# Patient Record
Sex: Male | Born: 1935 | Race: White | Hispanic: No | Marital: Married | Smoking: Former smoker
Health system: Southern US, Community
[De-identification: ages and names within clinical notes are randomized; demographics above are authoritative.]

## PROBLEM LIST (undated history)

## (undated) DIAGNOSIS — M1990 Unspecified osteoarthritis, unspecified site: Secondary | ICD-10-CM

## (undated) DIAGNOSIS — F419 Anxiety disorder, unspecified: Secondary | ICD-10-CM

## (undated) DIAGNOSIS — N400 Enlarged prostate without lower urinary tract symptoms: Secondary | ICD-10-CM

## (undated) DIAGNOSIS — I739 Peripheral vascular disease, unspecified: Secondary | ICD-10-CM

## (undated) DIAGNOSIS — I10 Essential (primary) hypertension: Secondary | ICD-10-CM

## (undated) DIAGNOSIS — I4892 Unspecified atrial flutter: Principal | ICD-10-CM

## (undated) DIAGNOSIS — R0602 Shortness of breath: Secondary | ICD-10-CM

## (undated) DIAGNOSIS — I509 Heart failure, unspecified: Secondary | ICD-10-CM

## (undated) DIAGNOSIS — E785 Hyperlipidemia, unspecified: Secondary | ICD-10-CM

## (undated) DIAGNOSIS — M109 Gout, unspecified: Secondary | ICD-10-CM

## (undated) DIAGNOSIS — I499 Cardiac arrhythmia, unspecified: Secondary | ICD-10-CM

## (undated) DIAGNOSIS — J449 Chronic obstructive pulmonary disease, unspecified: Secondary | ICD-10-CM

## (undated) DIAGNOSIS — R7303 Prediabetes: Secondary | ICD-10-CM

## (undated) DIAGNOSIS — K5730 Diverticulosis of large intestine without perforation or abscess without bleeding: Secondary | ICD-10-CM

## (undated) HISTORY — DX: Hyperlipidemia, unspecified: E78.5

## (undated) HISTORY — DX: Benign prostatic hyperplasia without lower urinary tract symptoms: N40.0

## (undated) HISTORY — PX: COLOSTOMY REVERSAL: SHX5782

## (undated) HISTORY — DX: Peripheral vascular disease, unspecified: I73.9

## (undated) HISTORY — PX: COLON SURGERY: SHX602

## (undated) HISTORY — DX: Essential (primary) hypertension: I10

## (undated) HISTORY — DX: Unspecified osteoarthritis, unspecified site: M19.90

## (undated) HISTORY — PX: ANKLE FRACTURE SURGERY: SHX122

## (undated) HISTORY — PX: CORNEAL TRANSPLANT: SHX108

## (undated) HISTORY — PX: INGUINAL HERNIA REPAIR: SUR1180

## (undated) HISTORY — PX: CATARACT EXTRACTION, BILATERAL: SHX1313

## (undated) HISTORY — PX: TONSILLECTOMY AND ADENOIDECTOMY: SUR1326

## (undated) HISTORY — DX: Diverticulosis of large intestine without perforation or abscess without bleeding: K57.30

## (undated) HISTORY — PX: COLONOSCOPY WITH ESOPHAGOGASTRODUODENOSCOPY (EGD): SHX5779

---

## 1995-10-31 HISTORY — PX: COLOSTOMY: SHX63

## 1995-10-31 HISTORY — PX: APPENDECTOMY: SHX54

## 1995-10-31 HISTORY — PX: PARTIAL COLECTOMY: SHX5273

## 1999-09-30 HISTORY — PX: COLONOSCOPY WITH ESOPHAGOGASTRODUODENOSCOPY (EGD): SHX5779

## 2001-06-30 HISTORY — PX: OTHER SURGICAL HISTORY: SHX169

## 2002-09-29 HISTORY — PX: CHOLECYSTECTOMY: SHX55

## 2004-10-05 ENCOUNTER — Ambulatory Visit: Payer: Self-pay | Admitting: Internal Medicine

## 2005-04-12 ENCOUNTER — Ambulatory Visit: Payer: Self-pay | Admitting: Internal Medicine

## 2005-04-12 LAB — CONVERTED CEMR LAB: PSA: 0.42 ng/mL

## 2005-07-10 ENCOUNTER — Ambulatory Visit: Payer: Self-pay | Admitting: Internal Medicine

## 2005-07-11 ENCOUNTER — Ambulatory Visit: Payer: Self-pay | Admitting: Internal Medicine

## 2005-07-12 ENCOUNTER — Ambulatory Visit: Payer: Self-pay | Admitting: Internal Medicine

## 2005-10-17 ENCOUNTER — Ambulatory Visit: Payer: Self-pay | Admitting: Internal Medicine

## 2005-12-14 ENCOUNTER — Ambulatory Visit: Payer: Self-pay | Admitting: Internal Medicine

## 2006-02-12 ENCOUNTER — Ambulatory Visit: Payer: Self-pay | Admitting: Internal Medicine

## 2006-06-27 ENCOUNTER — Ambulatory Visit: Payer: Self-pay | Admitting: Internal Medicine

## 2006-08-30 HISTORY — PX: VENTRAL HERNIA REPAIR: SHX424

## 2006-08-31 ENCOUNTER — Ambulatory Visit: Payer: Self-pay | Admitting: General Surgery

## 2006-09-12 ENCOUNTER — Ambulatory Visit: Payer: Self-pay | Admitting: General Surgery

## 2006-09-21 ENCOUNTER — Ambulatory Visit: Payer: Self-pay | Admitting: General Surgery

## 2006-09-21 ENCOUNTER — Other Ambulatory Visit: Payer: Self-pay

## 2006-09-25 ENCOUNTER — Ambulatory Visit: Payer: Self-pay | Admitting: Internal Medicine

## 2006-09-28 ENCOUNTER — Ambulatory Visit: Payer: Self-pay | Admitting: General Surgery

## 2006-11-01 ENCOUNTER — Ambulatory Visit: Payer: Self-pay | Admitting: Family Medicine

## 2006-12-31 ENCOUNTER — Ambulatory Visit: Payer: Self-pay | Admitting: Internal Medicine

## 2007-05-28 ENCOUNTER — Ambulatory Visit: Payer: Self-pay | Admitting: Internal Medicine

## 2007-05-29 LAB — CONVERTED CEMR LAB
ALT: 16 units/L (ref 0–53)
AST: 24 units/L (ref 0–37)
Albumin: 3.9 g/dL (ref 3.5–5.2)
Alkaline Phosphatase: 64 units/L (ref 39–117)
BUN: 18 mg/dL (ref 6–23)
Basophils Absolute: 0 10*3/uL (ref 0.0–0.1)
Basophils Relative: 0.5 % (ref 0.0–1.0)
Bilirubin, Direct: 0.1 mg/dL (ref 0.0–0.3)
CO2: 33 meq/L — ABNORMAL HIGH (ref 19–32)
Calcium: 9.5 mg/dL (ref 8.4–10.5)
Chloride: 100 meq/L (ref 96–112)
Creatinine, Ser: 1.1 mg/dL (ref 0.4–1.5)
Eosinophils Absolute: 0.4 10*3/uL (ref 0.0–0.6)
Eosinophils Relative: 4.1 % (ref 0.0–5.0)
GFR calc Af Amer: 85 mL/min
GFR calc non Af Amer: 70 mL/min
Glucose, Bld: 112 mg/dL — ABNORMAL HIGH (ref 70–99)
HCT: 41.9 % (ref 39.0–52.0)
Hemoglobin: 14.5 g/dL (ref 13.0–17.0)
Lymphocytes Relative: 26.7 % (ref 12.0–46.0)
MCHC: 34.6 g/dL (ref 30.0–36.0)
MCV: 96.7 fL (ref 78.0–100.0)
Monocytes Absolute: 0.8 10*3/uL — ABNORMAL HIGH (ref 0.2–0.7)
Monocytes Relative: 9 % (ref 3.0–11.0)
Neutro Abs: 5.3 10*3/uL (ref 1.4–7.7)
Neutrophils Relative %: 59.7 % (ref 43.0–77.0)
Phosphorus: 3.3 mg/dL (ref 2.3–4.6)
Platelets: 264 10*3/uL (ref 150–400)
Potassium: 4.7 meq/L (ref 3.5–5.1)
RBC: 4.33 M/uL (ref 4.22–5.81)
RDW: 14.8 % — ABNORMAL HIGH (ref 11.5–14.6)
Sed Rate: 16 mm/hr (ref 0–20)
Sodium: 139 meq/L (ref 135–145)
TSH: 1.11 microintl units/mL (ref 0.35–5.50)
Total Bilirubin: 1.1 mg/dL (ref 0.3–1.2)
Total Protein: 7.2 g/dL (ref 6.0–8.3)
WBC: 8.8 10*3/uL (ref 4.5–10.5)

## 2007-05-30 ENCOUNTER — Telehealth (INDEPENDENT_AMBULATORY_CARE_PROVIDER_SITE_OTHER): Payer: Self-pay | Admitting: *Deleted

## 2007-06-05 ENCOUNTER — Encounter: Payer: Self-pay | Admitting: Internal Medicine

## 2007-06-12 ENCOUNTER — Encounter: Payer: Self-pay | Admitting: Internal Medicine

## 2007-06-12 DIAGNOSIS — E1351 Other specified diabetes mellitus with diabetic peripheral angiopathy without gangrene: Secondary | ICD-10-CM

## 2007-06-12 DIAGNOSIS — K5730 Diverticulosis of large intestine without perforation or abscess without bleeding: Secondary | ICD-10-CM | POA: Insufficient documentation

## 2007-06-12 DIAGNOSIS — N400 Enlarged prostate without lower urinary tract symptoms: Secondary | ICD-10-CM | POA: Insufficient documentation

## 2007-06-13 DIAGNOSIS — M1990 Unspecified osteoarthritis, unspecified site: Secondary | ICD-10-CM | POA: Insufficient documentation

## 2007-06-17 ENCOUNTER — Ambulatory Visit: Payer: Self-pay | Admitting: Internal Medicine

## 2007-08-08 ENCOUNTER — Ambulatory Visit: Payer: Self-pay | Admitting: Family Medicine

## 2007-08-08 LAB — CONVERTED CEMR LAB
Bilirubin Urine: NEGATIVE
Blood in Urine, dipstick: NEGATIVE
Glucose, Urine, Semiquant: NEGATIVE
Ketones, urine, test strip: NEGATIVE
Nitrite: NEGATIVE
Protein, U semiquant: NEGATIVE
Specific Gravity, Urine: 1.02
Urobilinogen, UA: NEGATIVE
WBC Urine, dipstick: NEGATIVE
pH: 6.5

## 2007-08-16 ENCOUNTER — Ambulatory Visit: Payer: Self-pay | Admitting: Internal Medicine

## 2007-11-14 ENCOUNTER — Ambulatory Visit: Payer: Self-pay | Admitting: Family Medicine

## 2007-11-14 DIAGNOSIS — I10 Essential (primary) hypertension: Secondary | ICD-10-CM

## 2007-11-14 DIAGNOSIS — E1159 Type 2 diabetes mellitus with other circulatory complications: Secondary | ICD-10-CM | POA: Insufficient documentation

## 2007-11-19 ENCOUNTER — Ambulatory Visit: Payer: Self-pay | Admitting: Family Medicine

## 2007-11-22 ENCOUNTER — Ambulatory Visit: Payer: Self-pay | Admitting: Family Medicine

## 2007-11-22 LAB — CONVERTED CEMR LAB
ALT: 21 units/L (ref 0–53)
AST: 25 units/L (ref 0–37)
Albumin: 4.2 g/dL (ref 3.5–5.2)
Alkaline Phosphatase: 65 units/L (ref 39–117)
BUN: 20 mg/dL (ref 6–23)
Bilirubin, Direct: 0.1 mg/dL (ref 0.0–0.3)
CO2: 34 meq/L — ABNORMAL HIGH (ref 19–32)
Calcium: 9.5 mg/dL (ref 8.4–10.5)
Chloride: 94 meq/L — ABNORMAL LOW (ref 96–112)
Cholesterol: 161 mg/dL (ref 0–200)
Creatinine, Ser: 1.3 mg/dL (ref 0.4–1.5)
GFR calc Af Amer: 70 mL/min
GFR calc non Af Amer: 58 mL/min
Glucose, Bld: 122 mg/dL — ABNORMAL HIGH (ref 70–99)
Potassium: 4.6 meq/L (ref 3.5–5.1)
Sodium: 137 meq/L (ref 135–145)
TSH: 1.62 microintl units/mL (ref 0.35–5.50)
Total Bilirubin: 0.7 mg/dL (ref 0.3–1.2)
Total Protein: 8 g/dL (ref 6.0–8.3)

## 2007-11-29 ENCOUNTER — Ambulatory Visit: Payer: Self-pay | Admitting: Family Medicine

## 2007-11-29 DIAGNOSIS — E7800 Pure hypercholesterolemia, unspecified: Secondary | ICD-10-CM

## 2007-11-29 DIAGNOSIS — E1169 Type 2 diabetes mellitus with other specified complication: Secondary | ICD-10-CM | POA: Insufficient documentation

## 2007-11-29 DIAGNOSIS — E1149 Type 2 diabetes mellitus with other diabetic neurological complication: Secondary | ICD-10-CM | POA: Insufficient documentation

## 2007-11-29 DIAGNOSIS — J309 Allergic rhinitis, unspecified: Secondary | ICD-10-CM | POA: Insufficient documentation

## 2008-02-25 ENCOUNTER — Ambulatory Visit: Payer: Self-pay | Admitting: Family Medicine

## 2008-02-27 ENCOUNTER — Ambulatory Visit: Payer: Self-pay | Admitting: Family Medicine

## 2008-02-27 LAB — CONVERTED CEMR LAB
BUN: 20 mg/dL (ref 6–23)
CO2: 34 meq/L — ABNORMAL HIGH (ref 19–32)
Calcium: 9.4 mg/dL (ref 8.4–10.5)
Chloride: 104 meq/L (ref 96–112)
Cholesterol: 169 mg/dL (ref 0–200)
Creatinine, Ser: 1 mg/dL (ref 0.4–1.5)
GFR calc Af Amer: 94 mL/min
GFR calc non Af Amer: 78 mL/min
Glucose, Bld: 108 mg/dL — ABNORMAL HIGH (ref 70–99)
HDL: 45.3 mg/dL (ref >39.0)
LDL Cholesterol: 102 mg/dL — ABNORMAL HIGH (ref 0–99)
PSA: 0.36 ng/mL (ref 0.10–4.00)
Potassium: 3.8 meq/L (ref 3.5–5.1)
Sodium: 144 meq/L (ref 135–145)
Total CHOL/HDL Ratio: 3.7
Triglycerides: 111 mg/dL (ref 0–149)
VLDL: 22 mg/dL (ref 0–40)

## 2008-09-01 ENCOUNTER — Ambulatory Visit: Payer: Self-pay | Admitting: Family Medicine

## 2008-09-01 DIAGNOSIS — F0390 Unspecified dementia without behavioral disturbance: Secondary | ICD-10-CM

## 2008-09-04 ENCOUNTER — Ambulatory Visit: Payer: Self-pay | Admitting: Family Medicine

## 2008-09-09 ENCOUNTER — Ambulatory Visit: Payer: Self-pay | Admitting: Family Medicine

## 2008-10-05 ENCOUNTER — Ambulatory Visit: Payer: Self-pay | Admitting: Family Medicine

## 2008-12-23 ENCOUNTER — Encounter: Payer: Self-pay | Admitting: Family Medicine

## 2008-12-24 ENCOUNTER — Encounter (INDEPENDENT_AMBULATORY_CARE_PROVIDER_SITE_OTHER): Payer: Self-pay | Admitting: *Deleted

## 2009-03-02 ENCOUNTER — Ambulatory Visit: Payer: Self-pay | Admitting: Family Medicine

## 2009-03-03 LAB — CONVERTED CEMR LAB
ALT: 20 units/L (ref 0–53)
AST: 30 units/L (ref 0–37)
Albumin: 3.8 g/dL (ref 3.5–5.2)
Alkaline Phosphatase: 56 units/L (ref 39–117)
BUN: 24 mg/dL — ABNORMAL HIGH (ref 6–23)
Basophils Absolute: 0 10*3/uL (ref 0.0–0.1)
Basophils Relative: 0.5 % (ref 0.0–3.0)
Bilirubin, Direct: 0.1 mg/dL (ref 0.0–0.3)
CO2: 33 meq/L — ABNORMAL HIGH (ref 19–32)
Calcium: 9.3 mg/dL (ref 8.4–10.5)
Chloride: 103 meq/L (ref 96–112)
Cholesterol: 162 mg/dL (ref 0–200)
Creatinine, Ser: 1.2 mg/dL (ref 0.4–1.5)
Eosinophils Absolute: 0.6 10*3/uL (ref 0.0–0.7)
Eosinophils Relative: 7.4 % — ABNORMAL HIGH (ref 0.0–5.0)
GFR calc non Af Amer: 63.04 mL/min (ref >60)
Glucose, Bld: 116 mg/dL — ABNORMAL HIGH (ref 70–99)
HCT: 42.3 % (ref 39.0–52.0)
HDL: 39.5 mg/dL (ref >39.00)
Hemoglobin: 14.5 g/dL (ref 13.0–17.0)
LDL Cholesterol: 108 mg/dL — ABNORMAL HIGH (ref 0–99)
Lymphocytes Relative: 23.2 % (ref 12.0–46.0)
Lymphs Abs: 1.8 10*3/uL (ref 0.7–4.0)
MCHC: 34.3 g/dL (ref 30.0–36.0)
MCV: 105.5 fL — ABNORMAL HIGH (ref 78.0–100.0)
Monocytes Absolute: 0.6 10*3/uL (ref 0.1–1.0)
Monocytes Relative: 7.6 % (ref 3.0–12.0)
Neutro Abs: 4.9 10*3/uL (ref 1.4–7.7)
Neutrophils Relative %: 61.3 % (ref 43.0–77.0)
PSA: 0.39 ng/mL (ref 0.10–4.00)
Platelets: 221 10*3/uL (ref 150.0–400.0)
Potassium: 4.3 meq/L (ref 3.5–5.1)
RBC: 4.01 M/uL — ABNORMAL LOW (ref 4.22–5.81)
RDW: 15 % — ABNORMAL HIGH (ref 11.5–14.6)
Sodium: 142 meq/L (ref 135–145)
TSH: 1.15 microintl units/mL (ref 0.35–5.50)
Total Bilirubin: 0.9 mg/dL (ref 0.3–1.2)
Total CHOL/HDL Ratio: 4
Total Protein: 7 g/dL (ref 6.0–8.3)
Triglycerides: 73 mg/dL (ref 0.0–149.0)
VLDL: 14.6 mg/dL (ref 0.0–40.0)
Vitamin B-12: 49 pg/mL — ABNORMAL LOW (ref 211–911)
WBC: 7.9 10*3/uL (ref 4.5–10.5)

## 2009-03-11 ENCOUNTER — Ambulatory Visit: Payer: Self-pay | Admitting: Family Medicine

## 2009-03-11 DIAGNOSIS — R42 Dizziness and giddiness: Secondary | ICD-10-CM

## 2009-03-11 DIAGNOSIS — E538 Deficiency of other specified B group vitamins: Secondary | ICD-10-CM | POA: Insufficient documentation

## 2009-04-07 ENCOUNTER — Ambulatory Visit: Payer: Self-pay | Admitting: Family Medicine

## 2009-04-07 LAB — CONVERTED CEMR LAB: Vitamin B-12: 304 pg/mL (ref 211–911)

## 2009-04-14 ENCOUNTER — Ambulatory Visit: Payer: Self-pay | Admitting: Family Medicine

## 2009-10-12 ENCOUNTER — Ambulatory Visit: Payer: Self-pay | Admitting: Family Medicine

## 2009-10-15 LAB — CONVERTED CEMR LAB
ALT: 25 units/L (ref 0–53)
Albumin: 3.9 g/dL (ref 3.5–5.2)
BUN: 16 mg/dL (ref 6–23)
Bilirubin, Direct: 0.1 mg/dL (ref 0.0–0.3)
CO2: 32 meq/L (ref 19–32)
Chloride: 99 meq/L (ref 96–112)
Creatinine, Ser: 1.2 mg/dL (ref 0.4–1.5)
Glucose, Bld: 106 mg/dL — ABNORMAL HIGH (ref 70–99)
Total Protein: 7.5 g/dL (ref 6.0–8.3)

## 2009-11-16 ENCOUNTER — Ambulatory Visit: Payer: Self-pay | Admitting: Family Medicine

## 2010-03-18 ENCOUNTER — Ambulatory Visit: Payer: Self-pay | Admitting: Family Medicine

## 2010-05-13 ENCOUNTER — Ambulatory Visit: Payer: Self-pay | Admitting: Family Medicine

## 2010-05-17 ENCOUNTER — Telehealth (INDEPENDENT_AMBULATORY_CARE_PROVIDER_SITE_OTHER): Payer: Self-pay | Admitting: *Deleted

## 2010-05-17 ENCOUNTER — Ambulatory Visit: Payer: Self-pay | Admitting: Family Medicine

## 2010-05-23 LAB — CONVERTED CEMR LAB
ALT: 23 units/L (ref 0–53)
Alkaline Phosphatase: 63 units/L (ref 39–117)
Bilirubin, Direct: 0.2 mg/dL (ref 0.0–0.3)
CO2: 34 meq/L — ABNORMAL HIGH (ref 19–32)
Chloride: 102 meq/L (ref 96–112)
Creatinine, Ser: 1.1 mg/dL (ref 0.4–1.5)
Sodium: 141 meq/L (ref 135–145)
Total CHOL/HDL Ratio: 4
Total Protein: 7.1 g/dL (ref 6.0–8.3)
Triglycerides: 100 mg/dL (ref 0.0–149.0)

## 2010-11-18 ENCOUNTER — Other Ambulatory Visit: Payer: Self-pay | Admitting: Family Medicine

## 2010-11-18 ENCOUNTER — Ambulatory Visit
Admission: RE | Admit: 2010-11-18 | Discharge: 2010-11-18 | Payer: Self-pay | Source: Home / Self Care | Attending: Family Medicine | Admitting: Family Medicine

## 2010-11-18 ENCOUNTER — Encounter: Payer: Self-pay | Admitting: Family Medicine

## 2010-11-18 LAB — CBC WITH DIFFERENTIAL/PLATELET
Basophils Absolute: 0.1 10*3/uL (ref 0.0–0.1)
Basophils Relative: 0.5 % (ref 0.0–3.0)
Eosinophils Absolute: 0.5 10*3/uL (ref 0.0–0.7)
Eosinophils Relative: 5.5 % — ABNORMAL HIGH (ref 0.0–5.0)
HCT: 44.3 % (ref 39.0–52.0)
Hemoglobin: 15.3 g/dL (ref 13.0–17.0)
Lymphocytes Relative: 26.5 % (ref 12.0–46.0)
Lymphs Abs: 2.6 10*3/uL (ref 0.7–4.0)
MCHC: 34.5 g/dL (ref 30.0–36.0)
MCV: 96.1 fl (ref 78.0–100.0)
Monocytes Absolute: 0.9 10*3/uL (ref 0.1–1.0)
Monocytes Relative: 9.5 % (ref 3.0–12.0)
Neutro Abs: 5.7 10*3/uL (ref 1.4–7.7)
Neutrophils Relative %: 58 % (ref 43.0–77.0)
Platelets: 239 10*3/uL (ref 150.0–400.0)
RBC: 4.61 Mil/uL (ref 4.22–5.81)
RDW: 14.4 % (ref 11.5–14.6)
WBC: 9.9 10*3/uL (ref 4.5–10.5)

## 2010-11-18 LAB — HEPATIC FUNCTION PANEL
ALT: 20 U/L (ref 0–53)
AST: 28 U/L (ref 0–37)
Albumin: 4 g/dL (ref 3.5–5.2)
Alkaline Phosphatase: 63 U/L (ref 39–117)
Bilirubin, Direct: 0.1 mg/dL (ref 0.0–0.3)
Total Bilirubin: 0.7 mg/dL (ref 0.3–1.2)
Total Protein: 7.5 g/dL (ref 6.0–8.3)

## 2010-11-18 LAB — BASIC METABOLIC PANEL
BUN: 20 mg/dL (ref 6–23)
CO2: 34 mEq/L — ABNORMAL HIGH (ref 19–32)
Calcium: 9.8 mg/dL (ref 8.4–10.5)
Chloride: 97 mEq/L (ref 96–112)
Creatinine, Ser: 1.2 mg/dL (ref 0.4–1.5)
GFR: 63.97 mL/min (ref >60.00)
Glucose, Bld: 105 mg/dL — ABNORMAL HIGH (ref 70–99)
Potassium: 4.7 mEq/L (ref 3.5–5.1)
Sodium: 140 mEq/L (ref 135–145)

## 2010-11-18 LAB — TSH: TSH: 1.37 u[IU]/mL (ref 0.35–5.50)

## 2010-11-18 LAB — B12 AND FOLATE PANEL
Folate: >24.8 ng/mL (ref >5.9)
Vitamin B-12: 78 pg/mL — ABNORMAL LOW (ref 211–911)

## 2010-11-22 ENCOUNTER — Ambulatory Visit
Admission: RE | Admit: 2010-11-22 | Discharge: 2010-11-22 | Payer: Self-pay | Source: Home / Self Care | Attending: Family Medicine | Admitting: Family Medicine

## 2010-11-23 ENCOUNTER — Encounter: Payer: Self-pay | Admitting: Family Medicine

## 2010-11-24 ENCOUNTER — Encounter: Payer: Self-pay | Admitting: Family Medicine

## 2010-11-24 ENCOUNTER — Ambulatory Visit: Admission: RE | Admit: 2010-11-24 | Discharge: 2010-11-24 | Payer: Self-pay | Source: Home / Self Care

## 2010-11-29 NOTE — Assessment & Plan Note (Signed)
Summary: 1 month follow up dizzy spells,memory loss/rbh   Vital Signs:  Patient profile:   77 year old male Height:      65 inches Weight:      201.0 pounds BMI:     33.57 Temp:     98.5 degrees F oral Pulse rate:   62 / minute Pulse rhythm:   regular BP sitting:   118 / 84  (left arm) Cuff size:   large  Vitals Entered By: Zenda Alpers CMA Deborra Medina) (November 16, 2009 8:12 AM)  History of Present Illness: Chief complaint 1 month followup dizzy and memory loss  Continues to have congestion..." I have head cold since I was herre last" Amoxicillin helped sinus pain but nothing else. Eyes watering. Some sneezing, ? PND. No fever. No SOB...albuterol helps with coughing.   Continues to have episodic vertigo. Memory loss stable..not problematic for patient.     Problems Prior to Update: 1)  Sinusitis, Acute  (ICD-461.9) 2)  Intermittent Vertigo  (ICD-780.4) 3)  Vitamin B12 Deficiency  (ICD-266.2) 4)  Cough  (ICD-786.2) 5)  Bronchitis, Acute  (ICD-466.0) 6)  Memory Loss  (ICD-780.93) 7)  Allergic Rhinitis  (ICD-477.9) 8)  Prediabetes  (ICD-790.29) 9)  Hypercholesterolemia  (ICD-272.0) 10)  Screening For Lipoid Disorders  (ICD-V77.91) 11)  Hypertension, Benign Essential  (ICD-401.1) 12)  Uri  (ICD-465.9) 13)  Abdominal Pain  (ICD-789.00) 14)  Osteoarthritis  (ICD-715.90) 15)  Arthritis  (ICD-716.90) 16)  Benign Prostatic Hypertrophy  (ICD-600.00) 17)  Peripheral Vascular Disease  (ICD-443.9) 18)  Diverticulosis, Colon  (ICD-562.10)  Current Medications (verified): 1)  Hydrochlorothiazide 25 Mg  Tabs (Hydrochlorothiazide) .... Take 1 Tablet By Mouth Once A Day 2)  Proventil Hfa 108 (90 Base) Mcg/act Aers (Albuterol Sulfate) .... 2 Puffs Inh As Needed Cough Episodes 3)  Meloxicam 7.5 Mg Tabs (Meloxicam) .Marland Kitchen.. 1-2 Tab By Mouth Daily 4)  Amoxicillin 500 Mg Caps (Amoxicillin) .... 2 Tab By Mouth Two Times A Day X 10 Days  Allergies: 1)  * Codiclear Dh  (Hydrocodone-Guaifenesin)  Past History:  Past medical, surgical, family and social histories (including risk factors) reviewed, and no changes noted (except as noted below).  Past Medical History: Reviewed history from 06/12/2007 and no changes required. Diverticulosis, colon Peripheral vascular disease Benign prostatic hypertrophy Osteoarthritis  Past Surgical History: Reviewed history from 06/12/2007 and no changes required. Diverticulitis/partial colectomy ([facility]) 1997 Appendix before Left IH T & A, child Right ankle fx EGD/colon- gastic polyp 12/00 Stress cardiolite negative, EF 60% 09/02 Cholecystectomy 12/03 EGD/colon, gastritis, 2 gastic polyps 11/07 Ventral hernia repair- [facility] 11/07  Family History: Reviewed history from 06/12/2007 and no changes required. Dad died in his 51's --"old age" Sister died of ?CAD Mom No DM or cancer  Social History: Reviewed history from 06/12/2007 and no changes required. 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  meloicam didn't help in past. Not bpothering him enough for pain med or furhter eval at this time.  General:  Denies fatigue and fever. CV:  Denies chest pain or discomfort. Resp:  Denies shortness of breath. GI:  Denies abdominal pain. GU:  Denies dysuria.  Physical Exam  General:  overweight appearing male inNAd Head:  no maxillarty sinus ttp Ears:  External ear exam shows no significant lesions or deformities.  Otoscopic examination reveals clear canals, tympanic membranes are  intact bilaterally without bulging, retraction, inflammation or discharge. Hearing is grossly normal bilaterally. Nose:  nasal dischargemucosal pallor.   Mouth:  MMM Neck:  no carotid bruit or thyromegaly no cervical or supraclavicular lymphadenopathy  Lungs:  Normal respiratory effort, chest  expands symmetrically. Lungs are clear to auscultation, no crackles or wheezes. Heart:  Normal rate and regular rhythm. S1 and S2 normal without gallop, murmur, click, rub or other extra sounds. Pulses:  R and L posterior tibial pulses are full and equal bilaterally  Extremities:  no edema   Impression & Recommendations:  Problem # 1:  ALLERGIC RHINITIS (ICD-477.9) MAy be course of congestion...treat with zyrtec and nasal antihistmaine. MAy use mucinex as needed . Call if not improving.   Problem # 2:  INTERMITTENT VERTIGO (ICD-780.4) Not interested in referral at this time.   Problem # 3:  MEMORY LOSS (ICD-780.93) stable...not interested in further referral.   Complete Medication List: 1)  Hydrochlorothiazide 25 Mg Tabs (Hydrochlorothiazide) .... Take 1 tablet by mouth once a day 2)  Proventil Hfa 108 (90 Base) Mcg/act Aers (Albuterol sulfate) .... 2 puffs inh as needed cough episodes 3)  Meloxicam 7.5 Mg Tabs (Meloxicam) .Marland Kitchen.. 1-2 tab by mouth daily 4)  Amoxicillin 500 Mg Caps (Amoxicillin) .... 2 tab by mouth two times a day x 10 days  Patient Instructions: 1)  Start Zyrtec at bedtime. Start nasal spray sample ..2 sprays per nostril twice daily. 2)  Call if not improving.  3)  Please schedule a CPX appointment in 6 months .  4)  BMP prior to visit, ICD-9: 272.0 5)  Hepatic Panel prior to visit ICD-9:  6)  Lipid panel prior to visit ICD-9 :   Current Allergies (reviewed today): * CODICLEAR DH (HYDROCODONE-GUAIFENESIN)

## 2010-11-29 NOTE — Assessment & Plan Note (Signed)
Summary: HEAD,EAR PAIN/CLE   Vital Signs:  Patient profile:   77 year old male Weight:      195 pounds Temp:     98.1 degrees F oral Pulse rate:   64 / minute Pulse rhythm:   regular BP sitting:   124 / 74  (left arm) Cuff size:   regular  Vitals Entered By: Marty Heck CMA (Mar 18, 2010 8:07 AM) CC: Left side head and ear pain   History of Present Illness: Arthritis pain in hands...awakes with stiffness..decreased grip. Achy in all joints as well...back . Some redness in knuckles..hx of very physical job in past.  No family hx of RA..but lots of osteoarthritis.  Meloxicam did not help in past. HAs history of GERD and possible PUD...would take longterm.    Acute Visit History:      The patient complains of earache and nasal discharge.  These symptoms began 1 week ago.  He denies cough.  Other comments include: Sensitive on left side of scalp..sensitive to touch. SOme improvement now. Allergy symptoms. .        The earache is located on the left side.        Problems Prior to Update: 1)  Allergic Rhinitis  (ICD-477.9) 2)  Intermittent Vertigo  (ICD-780.4) 3)  Vitamin B12 Deficiency  (ICD-266.2) 4)  Cough  (ICD-786.2) 5)  Bronchitis, Acute  (ICD-466.0) 6)  Memory Loss  (ICD-780.93) 7)  Allergic Rhinitis  (ICD-477.9) 8)  Prediabetes  (ICD-790.29) 9)  Hypercholesterolemia  (ICD-272.0) 10)  Screening For Lipoid Disorders  (ICD-V77.91) 11)  Hypertension, Benign Essential  (ICD-401.1) 12)  Uri  (ICD-465.9) 13)  Abdominal Pain  (ICD-789.00) 14)  Osteoarthritis  (ICD-715.90) 15)  Arthritis  (ICD-716.90) 16)  Benign Prostatic Hypertrophy  (ICD-600.00) 17)  Peripheral Vascular Disease  (ICD-443.9) 18)  Diverticulosis, Colon  (ICD-562.10)  Current Medications (verified): 1)  Hydrochlorothiazide 25 Mg  Tabs (Hydrochlorothiazide) .... Take 1 Tablet By Mouth Once A Day 2)  Proventil Hfa 108 (90 Base) Mcg/act Aers (Albuterol Sulfate) .... 2 Puffs Inh As Needed Cough  Episodes  Allergies (verified): 1)  * Codiclear Dh (Hydrocodone-Guaifenesin)  Past History:  Past medical, surgical, family and social histories (including risk factors) reviewed, and no changes noted (except as noted below).  Past Medical History: Reviewed history from 06/12/2007 and no changes required. Diverticulosis, colon Peripheral vascular disease Benign prostatic hypertrophy Osteoarthritis  Past Surgical History: Reviewed history from 06/12/2007 and no changes required. Diverticulitis/partial colectomy ([facility]) 1997 Appendix before Left IH T & A, child Right ankle fx EGD/colon- gastic polyp 12/00 Stress cardiolite negative, EF 60% 09/02 Cholecystectomy 12/03 EGD/colon, gastritis, 2 gastic polyps 11/07 Ventral hernia repair- [facility] 11/07  Family History: Reviewed history from 06/12/2007 and no changes required. Dad died in his 27's --"old age" Sister died of ?CAD Mom No DM or cancer  Social History: Reviewed history from 06/12/2007 and no changes required. 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 CV:  Denies chest pain or discomfort. Resp:  Denies cough, shortness of breath, sputum productive, and wheezing. GI:  Denies abdominal pain. GU:  Denies dysuria.  Physical Exam  General:  overweight appearing male inNAd Head:  scalp ttp left side..no rash Eyes:  No corneal or conjunctival inflammation noted. EOMI. Perrla. Funduscopic exam benign, without hemorrhages, exudates or papilledema. Vision grossly normal. Ears:  External ear exam shows no significant lesions or deformities.  Otoscopic examination  reveals clear canals, tympanic membranes are intact bilaterally without bulging, retraction, inflammation or discharge. Hearing is grossly normal bilaterally. Nose:  nasal dischargemucosal pallor.   Mouth:  Oral mucosa and oropharynx without lesions or  exudates.  Teeth in good repair. Neck:  no carotid bruit or thyromegaly no cervical or supraclavicular lymphadenopathy  Lungs:  Normal respiratory effort, chest expands symmetrically. Lungs are clear to auscultation, no crackles or wheezes. Heart:  Normal rate and regular rhythm. S1 and S2 normal without gallop, murmur, click, rub or other extra sounds. Msk:  ttp, mild redness,s welling in B PIP, MCP.  No other current joint pain, full ROM all joints.  Pulses:  R and L posterior tibial pulses are full and equal bilaterally  Extremities:  no edema   Impression & Recommendations:  Problem # 1:  EAR PAIN, LEFT (ICD-388.70) No celar infection behind TM...given symptoms concerning for atypical shingels without rash. Pt not interested in valtrex given present for 1 week, improving now some. Treat pain with tramadol.  The following medications were removed from the medication list:    Amoxicillin 500 Mg Caps (Amoxicillin) .Marland Kitchen... 2 tab by mouth two times a day x 10 days  Problem # 2:  ALLERGIC RHINITIS (ICD-477.9) Restart nasal steroids. NAsal saline irrigation.  His updated medication list for this problem includes:    Fluticasone Propionate 50 Mcg/act Susp (Fluticasone propionate) .Marland Kitchen... 2 sprays per nostril daily  Orders: Prescription Created Electronically [phone])  Problem # 3:  OSTEOARTHRITIS (ICD-715.90) Given PUD hx in past, GERD...NSAIDs contraindicated for longterm.  Will try tramadol for pain.Marland Kitchendoes have some NSAID activity..so stop if stomache irritation.  The following medications were removed from the medication list:    Meloxicam 7.5 Mg Tabs (Meloxicam) .Marland Kitchen... 1-2 tab by mouth daily His updated medication list for this problem includes:    Tramadol Hcl 50 Mg Tabs (Tramadol hcl) .Marland Kitchen... 1/2 to 1 tab by mouth q 6 hours as needed pain.  limit use. take with food.  Complete Medication List: 1)  Hydrochlorothiazide 25 Mg Tabs (Hydrochlorothiazide) .... Take 1 tablet by mouth once a  day 2)  Proventil Hfa 108 (90 Base) Mcg/act Aers (Albuterol sulfate) .... 2 puffs inh as needed cough episodes 3)  Fluticasone Propionate 50 Mcg/act Susp (Fluticasone propionate) .... 2 sprays per nostril daily 4)  Tramadol Hcl 50 Mg Tabs (Tramadol hcl) .... 1/2 to 1 tab by mouth q 6 hours as needed pain.  limit use. take with food.  Patient Instructions: 1)  Call if rash appears on scalp or face. Call if ear pain worsening. 2)   USe tramadol for joint pain ans ear pain. 3)   Restart nasal steroid spray for allergies.  4)  Keep scheduled follow up appt.  Prescriptions: TRAMADOL HCL 50 MG TABS (TRAMADOL HCL) 1/2 to 1 tab by mouth q 6 hours as needed pain.  Limit use. Take with food.  #30 x 0   Entered and Authorized by:   Eliezer Lofts MD   Signed by:   Eliezer Lofts MD on 03/18/2010   Method used:   Print then Give to Patient   RxID:   [id number] FLUTICASONE PROPIONATE 50 MCG/ACT SUSP (FLUTICASONE PROPIONATE) 2 sprays per nostril daily  #1 x 11   Entered and Authorized by:   Eliezer Lofts MD   Signed by:   Eliezer Lofts MD on 03/18/2010   Method used:   Electronically to        [facility] [phone]* (retail)  [place], [place]  [address]       Ph: [phone]       Fax: [phone]   RxID:   [id number] PROVENTIL HFA 108 (90 BASE) MCG/ACT AERS (ALBUTEROL SULFATE) 2 puffs inh as needed cough episodes  #1 x 0   Entered and Authorized by:   Eliezer Lofts MD   Signed by:   Eliezer Lofts MD on 03/18/2010   Method used:   Electronically to        [facility] [address] (retail)       [place], [place]  [address]       Ph: [phone]       Fax: [phone]   RxID:   [id number]   Prior Medications (reviewed today): HYDROCHLOROTHIAZIDE 25 MG  TABS (HYDROCHLOROTHIAZIDE) Take 1 tablet by mouth once a day PROVENTIL HFA 108 (90 BASE) MCG/ACT AERS (ALBUTEROL SULFATE) 2 puffs inh as needed cough episodes Current Allergies  (reviewed today): * CODICLEAR DH (HYDROCODONE-GUAIFENESIN)

## 2010-11-29 NOTE — Assessment & Plan Note (Signed)
Summary: CPX  CYD   Vital Signs:  Patient profile:   77 year old male Height:      66.5 inches Weight:      196 pounds Temp:     98.6 degrees F oral Pulse rate:   74 / minute Pulse rhythm:   regular BP sitting:   126 / 80  (left arm) Cuff size:   large  Vitals Entered By: Arnetha Courser CMA (May 13, 2010 11:44 AM) CC: CPx   History of Present Illness: Doing well since last OV.   he mentions decrease in urinary flow and difficulty emptying completely. No dysuria.  He has a history of BPH.Marland Kitchenno past treatment.  No family history of prostate cancer.   Problems Prior to Update: 1)  Special Screening Malignant Neoplasm of Prostate  (ICD-V76.44) 2)  Ear Pain, Left  (ICD-388.70) 3)  Allergic Rhinitis  (ICD-477.9) 4)  Intermittent Vertigo  (ICD-780.4) 5)  Vitamin B12 Deficiency  (ICD-266.2) 6)  Cough  (ICD-786.2) 7)  Bronchitis, Acute  (ICD-466.0) 8)  Memory Loss  (ICD-780.93) 9)  Allergic Rhinitis  (ICD-477.9) 10)  Prediabetes  (ICD-790.29) 11)  Hypercholesterolemia  (ICD-272.0) 12)  Screening For Lipoid Disorders  (ICD-V77.91) 13)  Hypertension, Benign Essential  (ICD-401.1) 14)  Uri  (ICD-465.9) 15)  Abdominal Pain  (ICD-789.00) 16)  Osteoarthritis  (ICD-715.90) 17)  Arthritis  (ICD-716.90) 18)  Benign Prostatic Hypertrophy  (ICD-600.00) 19)  Peripheral Vascular Disease  (ICD-443.9) 20)  Diverticulosis, Colon  (ICD-562.10)  Current Medications (verified): 1)  Hydrochlorothiazide 25 Mg  Tabs (Hydrochlorothiazide) .... Take 1 Tablet By Mouth Once A Day 2)  Proventil Hfa 108 (90 Base) Mcg/act Aers (Albuterol Sulfate) .... 2 Puffs Inh As Needed Cough Episodes 3)  Fluticasone Propionate 50 Mcg/act Susp (Fluticasone Propionate) .... 2 Sprays Per Nostril Daily 4)  Tramadol Hcl 50 Mg Tabs (Tramadol Hcl) .... 1/2 To 1 Tab By Mouth Q 6 Hours As Needed Pain.  Limit Use. Take With Food. 5)  Tamsulosin Hcl 0.4 Mg Caps (Tamsulosin Hcl) .Marland Kitchen.. 1 Tab By Mouth Daily  Allergies: 1)  *  Codiclear Dh (Hydrocodone-Guaifenesin)  Past History:  Past medical, surgical, family and social histories (including risk factors) reviewed, and no changes noted (except as noted below).  Past Medical History: Reviewed history from 06/12/2007 and no changes required. Diverticulosis, colon Peripheral vascular disease Benign prostatic hypertrophy Osteoarthritis  Past Surgical History: Reviewed history from 06/12/2007 and no changes required. Diverticulitis/partial colectomy ([facility]) 1997 Appendix before Left IH T & A, child Right ankle fx EGD/colon- gastic polyp 12/00 Stress cardiolite negative, EF 60% 09/02 Cholecystectomy 12/03 EGD/colon, gastritis, 2 gastic polyps 11/07 Ventral hernia repair- [facility] 11/07  Family History: Reviewed history from 06/12/2007 and no changes required. Dad died in his 12's --"old age" Sister died of ?CAD Mom No DM or cancer  Social History: Reviewed history from 06/12/2007 and no changes required. 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 General:  Denies fatigue and fever. CV:  Denies chest pain or discomfort. Resp:  Denies shortness of breath. GI:  Denies abdominal pain, bloody stools, constipation, and diarrhea. GU:  Complains of nocturia; denies erectile dysfunction, urinary frequency, and urinary hesitancy; decreased flow of urine.  Gets up 2 times a nithgt...  Physical Exam  General:  overweight appearing male inNAd Mouth:  Oral mucosa and oropharynx without lesions or exudates.  Teeth in good  repair. Neck:  no carotid bruit or thyromegaly no cervical or supraclavicular lymphadenopathy  Lungs:  Normal respiratory effort, chest expands symmetrically. Lungs are clear to auscultation, no crackles or wheezes. Heart:  Normal rate and regular rhythm. S1 and S2 normal without gallop, murmur, click, rub or  other extra sounds. Abdomen:  Bowel sounds positive,abdomen soft and non-tender without masses, organomegaly or hernias noted. Scars multiple over abdomen. 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 NO clear basal Ca or squamous cell Ca.  Small erythematous, dry flacky lesion on right cheek...no clearly consistent with AK Psych:  Cognition and judgment appear intact. Alert and cooperative with normal attention span and concentration. No apparent delusions, illusions, hallucinations   Impression & Recommendations:  Problem # 1:  BENIGN PROSTATIC HYPERTROPHY (ICD-600.00) Open to trying med for symptoms. Refused prostate exam today...but interested in PSA...discussed how PSA not excellent screening after age 62, also without prostate exam.   Problem # 2:  HYPERCHOLESTEROLEMIA (ICD-272.0)  Due for reeval. Goal LDl <70 due to hx of PVD.  On no current medication. Labs Reviewed: SGOT: 32 (10/12/2009)   SGPT: 25 (10/12/2009)  Prior 10 Yr Risk Heart Disease: 22 % (10/12/2009)   HDL:39.50 (03/02/2009), 45.3 (02/25/2008)  LDL:108 (03/02/2009), 102 (02/25/2008)  Chol:162 (03/02/2009), 169 (02/25/2008)  Trig:73.0 (03/02/2009), 111 (02/25/2008)  Problem # 3:  SKIN RASH (ICD-782.1) Steroid cream for likely dermatitis. Recommended procedure appt for AKs on arms.  Problem # 4:  HYPERTENSION, BENIGN ESSENTIAL (ICD-401.1) Well controlled. Continue current medication.  His updated medication list for this problem includes:    Hydrochlorothiazide 25 Mg Tabs (Hydrochlorothiazide) .Marland Kitchen... Take 1 tablet by mouth once a day  BP today: 126/80 Prior BP: 124/74 (03/18/2010)  Prior 10 Yr Risk Heart Disease: 22 % (10/12/2009)  Labs Reviewed: K+: 4.2 (10/12/2009) Creat: : 1.2 (10/12/2009)   Chol: 162 (03/02/2009)   HDL: 39.50 (03/02/2009)   LDL: 108 (03/02/2009)   TG: 73.0  (03/02/2009)  Problem # 5:  PREDIABETES (ICD-790.29) Due for reeval.   Complete Medication List: 1)  Hydrochlorothiazide 25 Mg Tabs (Hydrochlorothiazide) .... Take 1 tablet by mouth once a day 2)  Proventil Hfa 108 (90 Base) Mcg/act Aers (Albuterol sulfate) .... 2 puffs inh as needed cough episodes 3)  Fluticasone Propionate 50 Mcg/act Susp (Fluticasone propionate) .... 2 sprays per nostril daily 4)  Tramadol Hcl 50 Mg Tabs (Tramadol hcl) .... 1/2 to 1 tab by mouth q 6 hours as needed pain.  limit use. take with food. 5)  Tamsulosin Hcl 0.4 Mg Caps (Tamsulosin hcl) .Marland Kitchen.. 1 tab by mouth daily  Other Orders: Tdap => 89yr IM ([phone] Admin 1st Vaccine ([phone] Admin 1st Vaccine ([facility] ([phone]  Patient Instructions: 1)  Start flomax daily for urinary symptoms... call if not improving over next 1-2 month. 2)  Fasting lipId CMET Dx 272.0 3)   Apply topical hydrocortisone cream...CORTISONE 10 apply twice daily. 4)   Call insurance about shingles vaccine..for nurse visit if interested.  5)  Please schedule a follow-up appointment in 6 months 30 min OV..consider AK cryotherapy. Prescriptions: TAMSULOSIN HCL 0.4 MG CAPS (TAMSULOSIN HCL) 1 tab by mouth daily  #30 x 11   Entered and Authorized by:   AEliezer LoftsMD   Signed by:   AEliezer LoftsMD on 05/13/2010   Method used:   Electronically to  [facility] [address] (retail)       [place], [place]  [address]       Ph: [phone]       Fax: [phone]   RxID:   [id number]   Current Allergies (reviewed today): * CODICLEAR DH (HYDROCODONE-GUAIFENESIN)  Last PSA Result:  0.39 (03/02/2009 9:28:50 AM) PSA Next Due:  Not Indicated      Tetanus/Td Vaccine    Vaccine Type: Tdap    Site: left deltoid    Mfr: GlaxoSmithKline    Dose: 0.5 ml    Route: IM    Given by: Edwin Dada CMA ([place])    Exp. Date: 01/22/2012    Lot #: [id number]    VIS given: 09/17/07 version given May 13, 2010.   Appended Document: Immunization Entry

## 2010-11-29 NOTE — Progress Notes (Signed)
----  Converted from flag ---- ---- 05/17/2010 8:20 AM, Eliezer Lofts MD wrote: Faythe Ghee Dx 76.44  ---- 05/17/2010 8:16 AM, Daralene Milch CMA (AAMA) wrote: pt wants psa added to his labs is this ok? Thanks ------------------------------

## 2010-12-01 NOTE — Assessment & Plan Note (Signed)
Summary: 30 MIN APPT 6 MONTH FOLLOW UP/[facility]   Vital Signs:  Patient profile:   77 year old male Height:      66.5 inches Weight:      206.75 pounds BMI:     32.99 Temp:     98.1 degrees F oral Pulse rate:   74 / minute Pulse rhythm:   regular BP supine:   138 / 80  (left arm) BP sitting:   140 / 80  (left arm) BP standing:   138 / 84  (left arm) Cuff size:   regular  Vitals Entered By: Zenda Alpers CMA Deborra Medina) (November 18, 2010 8:24 AM)  History of Present Illness: Chief complaint 6 month follow up   BPH: started on flomax.. 6 months ago.. stopped this med becasue it was causing dizziness was on this few days... helped minimally.  HTN: borderline high.. not checking at home.  LAst OV in [place].   Hx of intermittant vertigo. HAs been ongoing since2008 or longer. Neg TSH, cbc etc when eval'd in past.  Neg MRI in past in 2008. B12 very low in past...no longer taking supplement.  Feeling pressure in head.. continued intermittant dizziness.  Feels off balance... has to sit down. Occuring spontaneously.. no relationship with sitting or standing.Caleb Bryant occ notes when he moves heada in a certain.  Nasal congestion.. was improved on fluicasone... No ear pain. Has noted tinnitus intermitant B ears.. ongoing x years. Decreased hearing.. not interested in hearing aids. 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   Prediabetes stable and high cholesterol not  goal last check in 04/2010.. refused chol med at that time. LDL goal <70 given hx of PAD.   Problems Prior to Update: 1)  Chest Pain, Atypical  (ICD-786.59) 2)  Special Screening Malignant Neoplasm of Prostate  (ICD-V76.44) 3)  Allergic Rhinitis  (ICD-477.9) 4)  Intermittent Vertigo  (ICD-780.4) 5)  Vitamin B12 Deficiency  (ICD-266.2) 6)  Memory Loss  (ICD-780.93) 7)  Allergic Rhinitis  (ICD-477.9) 8)  Prediabetes  (ICD-790.29) 9)  Hypercholesterolemia  (ICD-272.0) 10)  Screening For Lipoid Disorders  (ICD-V77.91) 11)   Hypertension, Benign Essential  (ICD-401.1) 12)  Osteoarthritis  (ICD-715.90) 13)  Benign Prostatic Hypertrophy  (ICD-600.00) 14)  Peripheral Vascular Disease  (ICD-443.9) 15)  Diverticulosis, Colon  (ICD-562.10)  Current Medications (verified): 1)  Hydrochlorothiazide 25 Mg  Tabs (Hydrochlorothiazide) .... Take 1 Tablet By Mouth Once A Day 2)  Proventil Hfa 108 (90 Base) Mcg/act Aers (Albuterol Sulfate) .... 2 Puffs Inh As Needed Cough Episodes  Allergies: 1)  * Codiclear Dh (Hydrocodone-Guaifenesin)  Past History:  Past medical, surgical, family and social histories (including risk factors) reviewed, and no changes noted (except as noted below).  Past Medical History: Reviewed history from 06/12/2007 and no changes required. Diverticulosis, colon Peripheral vascular disease Benign prostatic hypertrophy Osteoarthritis  Past Surgical History: Reviewed history from 06/12/2007 and no changes required. Diverticulitis/partial colectomy ([facility]) 1997 Appendix before Left IH T & A, child Right ankle fx EGD/colon- gastic polyp 12/00 Stress cardiolite negative, EF 60% 09/02 Cholecystectomy 12/03 EGD/colon, gastritis, 2 gastic polyps 11/07 Ventral hernia repair- [facility] 11/07  Family History: Reviewed history from 06/12/2007 and no changes required. Dad died in his 34's --"old age" Sister died of ?CAD Mom No DM or cancer  Social History: Reviewed history from 06/12/2007 and no changes required. 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 CV:  ON questioning mentions occ episodes central chest pain last summer... non exertional.. associcated with shoulder joint pain in past....no chest pain now.Caleb Bryant GI:  Complains of indigestion; using Tums several times a week for this.  GI: Dr. Marlyn Corporal tried prilosec in past.. no relief. eats poor diet.. fried foods etc.. Neuro:  Denies  falling down, headaches, numbness, seizures, tremors, and weakness. Psych:  Denies anxiety and depression.  Physical Exam  General:  overweight appearing male inNAd Mouth:  Oral mucosa and oropharynx without lesions or exudates.  Teeth in good repair. Neck:  no carotid bruit or thyromegaly no cervical or supraclavicular lymphadenopathy  Lungs:  Normal respiratory effort, chest expands symmetrically. Lungs are clear to auscultation, no crackles or wheezes. Heart:  Normal rate and regular rhythm. S1 and S2 normal without gallop, murmur, click, rub or other extra sounds. Abdomen:  Bowel sounds positive,abdomen soft and non-tender without masses, organomegaly or hernias noted. Pulses:  R and L posterior tibial pulses are full and equal bilaterally  Extremities:  no edema  Neurologic:  No cranial nerve deficits noted. Station and gait are normal. Plantar reflexes are down-going bilaterally. DTRs are symmetrical throughout. Sensory, motor and coordinative functions appear intact.  Dizzy upon standing.. unable to reproduce Eply maneuver. Skin:  Intact without suspicious lesions or rashes Psych:  Cognition and judgment appear intact. Alert and cooperative with normal attention span and concentration. No apparent delusions, illusions, hallucinations   Impression & Recommendations:  Problem # 1:  INTERMITTENT VERTIGO (ICD-780.4) Low B12 in past.. will recheck. Along with other labs.  Given hx of PAD (although no blear carotid bruit)... will check carotid dopplers.  No clear cardiac source. EKG stable from 2007. MRI in past in 2008 for same issue negative.. no neuro symptoms otherwise.  Orthostatic vitals: neg Move forward for ENT referral given most consistent with vertigo from inner ear source. Orders: TLB-CBC Platelet - w/Differential (85025-CBCD) TLB-TSH (Thyroid Stimulating Hormone) (84443-TSH) TLB-Hepatic/Liver Function Pnl (80076-HEPATIC) TLB-BMP (Basic Metabolic Panel-BMET)  (17408-XKGYJEH) ENT Referral (ENT) Radiology Referral (Radiology)  Problem # 2:  CHEST PAIN, ATYPICAL (ICD-786.59) Most liekly due to gastritis/GERD...prilosec not helpful in past.. trial of pantoprazole.  Orders: EKG w/ Interpretation (93000)Stable .. no new changes since 2007.   Problem # 3:  ALLERGIC RHINITIS (ICD-477.9) Rrestart nasal fluticasone. His updated medication list for this problem includes:    Fluticasone Propionate 50 Mcg/act Susp (Fluticasone propionate) .Caleb Bryant... 2 sprays per nostril daily  Problem # 4:  HYPERTENSION, BENIGN ESSENTIAL (ICD-401.1) Borderline.. continue to follow. Continue current med.  His updated medication list for this problem includes:    Hydrochlorothiazide 25 Mg Tabs (Hydrochlorothiazide) .Caleb Bryant... Take 1 tablet by mouth once a day  Complete Medication List: 1)  Hydrochlorothiazide 25 Mg Tabs (Hydrochlorothiazide) .... Take 1 tablet by mouth once a day 2)  Proventil Hfa 108 (90 Base) Mcg/act Aers (Albuterol sulfate) .... 2 puffs inh as needed cough episodes 3)  Fluticasone Propionate 50 Mcg/act Susp (Fluticasone propionate) .... 2 sprays per nostril daily 4)  Pantoprazole Sodium 40 Mg Tbec (Pantoprazole sodium) .Caleb Bryant.. 1 tab by mouth daily  Other Orders: TLB-B12 + Folate Pnl (82746_82607-B12/FOL)  Patient Instructions: 1)  Trail of pantoprazole (generic protonix). 2)  Restart nasal spray.. fluticasone 2 sprays per nostril daily. 3)   Follow up appt in 1 month x 30 min. Vertigo, GERD. 4)   Referral Appointment Information 5)  Day/Date: 6)  Time: 7)  Place/MD: 8)  Address: 9)  Phone/Fax: 10)  Patient given appointment information. Information/Orders faxed/mailed.  Prescriptions: PANTOPRAZOLE SODIUM 40 MG TBEC (PANTOPRAZOLE SODIUM) 1 tab by mouth daily  #30 x 11   Entered and Authorized by:   Eliezer Lofts MD   Signed by:   Eliezer Lofts MD on 11/18/2010   Method used:   Electronically to        [facility] [address]* (retail)       [place], [place]  [address]       Ph: [phone]       Fax: [phone]   RxID:   [id number] FLUTICASONE PROPIONATE 50 MCG/ACT SUSP (FLUTICASONE PROPIONATE) 2 sprays per nostril daily  #1 x 11   Entered and Authorized by:   Eliezer Lofts MD   Signed by:   Eliezer Lofts MD on 11/18/2010   Method used:   Electronically to        [facility] [address]* (retail)       [place], [place]  [address]       Ph: [phone]       Fax: [phone]   RxID:   [id number]    Orders Added: 1)  TLB-B12 + Folate Pnl [[id number]-B12/FOL] 2)  TLB-CBC Platelet - w/Differential [85025-CBCD] 3)  TLB-TSH (Thyroid Stimulating Hormone) [84443-TSH] 4)  TLB-Hepatic/Liver Function Pnl [80076-HEPATIC] 5)  TLB-BMP (Basic Metabolic Panel-BMET) [82081-NGITJLL] 6)  ENT Referral [ENT] 7)  Radiology Referral [Radiology] 8)  EKG w/ Interpretation [93000] 9)  Est. Patient Level IV [97471]    Current Allergies (reviewed today): * CODICLEAR DH (HYDROCODONE-GUAIFENESIN)

## 2010-12-01 NOTE — Assessment & Plan Note (Signed)
Summary: b-12 injection/hmw  Nurse Visit   Allergies: 1)  * Codiclear Dh (Hydrocodone-Guaifenesin)  Medication Administration  Injection # 1:    Medication: Vit B12 1000 mcg    Diagnosis: VITAMIN B12 DEFICIENCY (ICD-266.2)    Route: IM    Site: L deltoid    Exp Date: 07/30/2012    Lot #: [id number]    Mfr: [place]    Patient tolerated injection without complications    Given by: Emelia Salisbury LPN (November 22, 3965 8:49 AM)  Orders Added: 1)  Vit B12 1000 mcg [J3420] 2)  Admin of Therapeutic Inj  intramuscular or subcutaneous [96372]   Medication Administration  Injection # 1:    Medication: Vit B12 1000 mcg    Diagnosis: VITAMIN B12 DEFICIENCY (ICD-266.2)    Route: IM    Site: L deltoid    Exp Date: 07/30/2012    Lot #: [id number]    Mfr: [place]    Patient tolerated injection without complications    Given by: Emelia Salisbury LPN (November 23, 2895 8:49 AM)  Orders Added: 1)  Vit B12 1000 mcg [J3420] 2)  Admin of Therapeutic Inj  intramuscular or subcutaneous [91504]

## 2010-12-01 NOTE — Miscellaneous (Signed)
Summary: Orders Update  Clinical Lists Changes  Orders: Added new Test order of Carotid Duplex (Carotid Duplex) - Signed

## 2010-12-05 ENCOUNTER — Encounter: Payer: Self-pay | Admitting: Family Medicine

## 2010-12-09 ENCOUNTER — Ambulatory Visit: Payer: Self-pay | Admitting: Unknown Physician Specialty

## 2010-12-20 ENCOUNTER — Ambulatory Visit: Payer: Self-pay | Admitting: Family Medicine

## 2010-12-21 NOTE — Letter (Signed)
Summary: [facility]  [facility]   Imported By: Laural Benes 12/16/2010 13:02:35  _____________________________________________________________________  External Attachment:    Type:   Image     Comment:   External Document  Appended Document: [facility] Notify pt that neurology referral is recommended for further eval of dizziness as well as memory issues. Let me know idf pt agreeable.  Appended Document: [facility] LMOM for pt to call.  Appended Document:  [facility] Spoke with pt, he will discuss this with you when he comes in to see you on 2/24.

## 2010-12-23 ENCOUNTER — Ambulatory Visit (INDEPENDENT_AMBULATORY_CARE_PROVIDER_SITE_OTHER): Payer: Medicare Other | Admitting: Family Medicine

## 2010-12-23 ENCOUNTER — Encounter: Payer: Self-pay | Admitting: Family Medicine

## 2010-12-23 ENCOUNTER — Ambulatory Visit: Payer: Self-pay

## 2010-12-23 DIAGNOSIS — R42 Dizziness and giddiness: Secondary | ICD-10-CM

## 2010-12-23 DIAGNOSIS — J309 Allergic rhinitis, unspecified: Secondary | ICD-10-CM

## 2010-12-23 DIAGNOSIS — R413 Other amnesia: Secondary | ICD-10-CM

## 2010-12-23 DIAGNOSIS — E538 Deficiency of other specified B group vitamins: Secondary | ICD-10-CM

## 2010-12-27 NOTE — Assessment & Plan Note (Signed)
Summary: 1 MTH FU/LFW B-12 SHOT   Vital Signs:  Patient profile:   77 year old male Height:      66.5 inches Weight:      200.50 pounds BMI:     31.99 Temp:     98.5 degrees F oral Pulse rate:   80 / minute Pulse rhythm:   regular BP sitting:   130 / 80  (left arm) Cuff size:   regular  Vitals Entered ByLacretia Nicks (December 23, 2010 9:55 AM) CC: 1 month follow up   History of Present Illness:   OVF:IEPP controlled today  Hx of intermittant vertigo. Has been ongoing since 2008 or longer. Neg TSH, cbc etc when eval'd in past.  Neg MRI in past in 2008. B12 very low in past...no longer taking supplement.  Feeling pressure in head.. continued intermittant dizziness.  Feels off balance... has to sit down. Occuring spontaneously.. no relationship with sitting or standing.Marland Kitchen occ notes when he moves heada in a certain. Nasal congestion.. was improved on fluicasone... No ear pain. Has noted tinnitus intermitant B ears.. ongoing x years. Decreased hearing.. not interested in hearing aids. 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  At last OV.Marland Kitchen B12 was low... he was started on B12 injections. Sent to ENT for eval... he did not feel vertigo from ENT pathology.. recommended neurology eval.  Also nml carotid dopplers B 1/26  HAd repeat MRI ordered by ENT: not sure what this showed.  Today he reports nasal congestion and frontal sinus pressure.soreness in nose. Continues with dizzy spells.    Has appt with neurologist 4/24 Dr. Manuella Ghazi.[facility].  Problems Prior to Update: 1)  Chest Pain, Atypical  (ICD-786.59) 2)  Special Screening Malignant Neoplasm of Prostate  (ICD-V76.44) 3)  Allergic Rhinitis  (ICD-477.9) 4)  Intermittent Vertigo  (ICD-780.4) 5)  Vitamin B12 Deficiency  (ICD-266.2) 6)  Memory Loss  (ICD-780.93) 7)  Allergic Rhinitis  (ICD-477.9) 8)  Prediabetes  (ICD-790.29) 9)  Hypercholesterolemia  (ICD-272.0) 10)  Screening For Lipoid Disorders   (ICD-V77.91) 11)  Hypertension, Benign Essential  (ICD-401.1) 12)  Osteoarthritis  (ICD-715.90) 13)  Benign Prostatic Hypertrophy  (ICD-600.00) 14)  Peripheral Vascular Disease  (ICD-443.9) 15)  Diverticulosis, Colon  (ICD-562.10)  Current Medications (verified): 1)  Hydrochlorothiazide 25 Mg  Tabs (Hydrochlorothiazide) .... Take 1 Tablet By Mouth Once A Day 2)  Proventil Hfa 108 (90 Base) Mcg/act Aers (Albuterol Sulfate) .... 2 Puffs Inh As Needed Cough Episodes 3)  Fluticasone Propionate 50 Mcg/act Susp (Fluticasone Propionate) .... 2 Sprays Per Nostril Daily 4)  Pantoprazole Sodium 40 Mg Tbec (Pantoprazole Sodium) .Marland Kitchen.. 1 Tab By Mouth Daily 5)  Cyanocobalamin 1000 Mcg/ml Soln (Cyanocobalamin) .... Take One Injection Monthly  Allergies: 1)  * Codiclear Dh (Hydrocodone-Guaifenesin)  Past History:  Past medical, surgical, family and social histories (including risk factors) reviewed, and no changes noted (except as noted below).  Past Medical History: Reviewed history from 06/12/2007 and no changes required. Diverticulosis, colon Peripheral vascular disease Benign prostatic hypertrophy Osteoarthritis  Past Surgical History: Reviewed history from 06/12/2007 and no changes required. Diverticulitis/partial colectomy ([facility]) 1997 Appendix before Left IH T & A, child Right ankle fx EGD/colon- gastic polyp 12/00 Stress cardiolite negative, EF 60% 09/02 Cholecystectomy 12/03 EGD/colon, gastritis, 2 gastic polyps 11/07 Ventral hernia repair- [facility] 11/07  Family History: Reviewed history from 06/12/2007 and no changes required. Dad died in his 52's --"old age" Sister died of ?CAD Mom No DM or cancer  Social History: Reviewed  history from 06/12/2007 and no changes required. 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 CV:  Denies chest pain or discomfort. Resp:  Denies  shortness of breath. GI:  Denies abdominal pain. GU:  Denies dysuria.  Physical Exam  General:  overweight appearing male inNAd Ears:  External ear exam shows no significant lesions or deformities.  Otoscopic examination reveals clear canals, tympanic membranes are intact bilaterally without bulging, retraction, inflammation or discharge. Hearing is grossly normal bilaterally. Nose:  nasal dischargemucosal pallor.   Mouth:  Oral mucosa and oropharynx without lesions or exudates.  Teeth in good repair. Neck:  no carotid bruit or thyromegaly no cervical or supraclavicular lymphadenopathy  Lungs:  Normal respiratory effort, chest expands symmetrically. Lungs are clear to auscultation, no crackles or wheezes. Heart:  Normal rate and regular rhythm. S1 and S2 normal without gallop, murmur, click, rub or other extra sounds. Pulses:  R and L posterior tibial pulses are full and equal bilaterally  Neurologic:  No cranial nerve deficits noted. Station and gait are normal. Plantar reflexes are down-going bilaterally. DTRs are symmetrical throughout. Sensory, motor and coordinative functions appear intact.  Dizzy upon standing.. unable to reproduce Eply maneuver. Skin:  Intact without suspicious lesions or rashes Psych:  Cognition and judgment appear intact. Alert and cooperative with normal attention span and concentration. No apparent delusions, illusions, hallucinations   Impression & Recommendations:  Problem # 1:  INTERMITTENT VERTIGO (ICD-780.4) No ENT etiology per ENT MD.  Per pt had recent MRI... ? showed stroke....will get results.  MAy need to start baby asa daily.  Keep appt with neuro.   Problem # 2:  MEMORY LOSS (ICD-780.93) [place] per pt.  States today that when he was younger he was a wekkend alcoholic... he feels ETOH hx could be contributing to current memory issues. TMemory probs does not bother him much.   Problem # 3:  VITAMIN B12 DEFICIENCY (ICD-266.2) Has not noticed any  change with B12 injections.  Orders: Vit B12 1000 mcg (J3420) Admin of Therapeutic Inj  intramuscular or subcutaneous (90122)  Problem # 4:  ALLERGIC RHINITIS (ICD-477.9)  His updated medication list for this problem includes:    Fluticasone Propionate 50 Mcg/act Susp (Fluticasone propionate) .Marland Kitchen... 2 sprays per nostril daily  Complete Medication List: 1)  Hydrochlorothiazide 25 Mg Tabs (Hydrochlorothiazide) .... Take 1 tablet by mouth once a day 2)  Proventil Hfa 108 (90 Base) Mcg/act Aers (Albuterol sulfate) .... 2 puffs inh as needed cough episodes 3)  Fluticasone Propionate 50 Mcg/act Susp (Fluticasone propionate) .... 2 sprays per nostril daily 4)  Pantoprazole Sodium 40 Mg Tbec (Pantoprazole sodium) .Marland Kitchen.. 1 tab by mouth daily 5)  Cyanocobalamin 1000 Mcg/ml Soln (Cyanocobalamin) .... Take one injection monthly  Patient Instructions: 1)  We will call when we review MRI results. 2)  Keep appt with neuro.    Medication Administration  Injection # 1:    Medication: Vit B12 1000 mcg    Diagnosis: VITAMIN B12 DEFICIENCY (ICD-266.2)    Route: IM    Site: R deltoid    Exp Date: 12/23/2010    Lot #: [id number]    Mfr: [place]    Given by: Lacretia Nicks (December 23, 2010 10:07 AM)  Orders Added: 1)  Vit B12 1000 mcg [J3420] 2)  Admin of Therapeutic Inj  intramuscular or subcutaneous [96372] 3)  Est. Patient Level IV [31427]  Current Allergies (reviewed today): * CODICLEAR DH (HYDROCODONE-GUAIFENESIN)

## 2011-02-15 ENCOUNTER — Telehealth: Payer: Self-pay | Admitting: *Deleted

## 2011-02-15 MED ORDER — OMEPRAZOLE MAGNESIUM 20 MG PO TBEC: 20.0000 mg | DELAYED_RELEASE_TABLET | Freq: Every day | ORAL | Status: DC

## 2011-02-15 NOTE — Telephone Encounter (Signed)
Sent in prescription. Let pt know if symptoms not as well controlled to call for [place] to increase the dose.

## 2011-02-15 NOTE — Telephone Encounter (Signed)
Patient has been taking prtonix, but the cost for the patient is going up to $60 from $6. Gerald Stabs suggested omeprazole 20 mg and patient is okay with trying it, but will need script sent to [facility].

## 2011-06-19 ENCOUNTER — Telehealth: Payer: Self-pay | Admitting: *Deleted

## 2011-06-19 ENCOUNTER — Other Ambulatory Visit: Payer: Self-pay | Admitting: Family Medicine

## 2011-06-23 NOTE — Telephone Encounter (Signed)
Chart opened in error

## 2011-07-31 ENCOUNTER — Encounter: Payer: Self-pay | Admitting: Family Medicine

## 2011-07-31 ENCOUNTER — Ambulatory Visit (INDEPENDENT_AMBULATORY_CARE_PROVIDER_SITE_OTHER): Payer: Medicare Other | Admitting: Family Medicine

## 2011-07-31 VITALS — BP 130/78 | HR 66 | Temp 98.8°F | Ht 66.0 in | Wt 197.1 lb

## 2011-07-31 DIAGNOSIS — R7309 Other abnormal glucose: Secondary | ICD-10-CM

## 2011-07-31 DIAGNOSIS — E7800 Pure hypercholesterolemia, unspecified: Secondary | ICD-10-CM

## 2011-07-31 DIAGNOSIS — Z23 Encounter for immunization: Secondary | ICD-10-CM

## 2011-07-31 DIAGNOSIS — I10 Essential (primary) hypertension: Secondary | ICD-10-CM

## 2011-07-31 MED ORDER — ALBUTEROL SULFATE HFA 108 (90 BASE) MCG/ACT IN AERS: 2.0000 | INHALATION_SPRAY | RESPIRATORY_TRACT | Status: DC | PRN

## 2011-07-31 MED ORDER — HYDROCHLOROTHIAZIDE 25 MG PO TABS: 25.0000 mg | ORAL_TABLET | Freq: Every day | ORAL | Status: DC

## 2011-07-31 MED ORDER — FLUTICASONE PROPIONATE 50 MCG/ACT NA SUSP: 2.0000 | Freq: Every day | NASAL | Status: DC

## 2011-07-31 NOTE — Progress Notes (Signed)
  Subjective:    Patient ID: Caleb Bryant, male    DOB: [birth year], 77 y.o.   MRN: [id number]  HPI  Hypertension:  Well controlled on current meds. Using medication without problems or lightheadedness:  Chest pain with exertion: None Edema:None Short of breath:Occ Average home BPs: Other issues:rare dizzy spells at this point. Saw neurologist..no recommendations made.    Due for cholesterol check and CPX.   Review of Systems  Constitutional: Negative for fever and fatigue.  Respiratory: Negative for shortness of breath.   Cardiovascular: Negative for chest pain.  Gastrointestinal: Negative for abdominal pain.       Objective:   Physical Exam  Constitutional: Vital signs are normal. He appears well-developed and well-nourished.  HENT:  Head: Normocephalic.  Right Ear: Hearing normal.  Left Ear: Hearing normal.  Nose: Nose normal.  Mouth/Throat: Oropharynx is clear and moist and mucous membranes are normal.  Neck: Trachea normal. Carotid bruit is not present. No mass and no thyromegaly present.  Cardiovascular: Normal rate, regular rhythm and normal pulses.  Exam reveals no gallop, no distant heart sounds and no friction rub.   No murmur heard.      No peripheral edema  Pulmonary/Chest: Effort normal and breath sounds normal. No respiratory distress.  Skin: Skin is warm, dry and intact.  Psychiatric: His speech is normal and behavior is normal.          Assessment & Plan:

## 2011-08-04 ENCOUNTER — Encounter: Payer: Self-pay | Admitting: Family Medicine

## 2011-08-04 NOTE — Assessment & Plan Note (Signed)
Well controlled. Continue current medication.  

## 2011-08-04 NOTE — Patient Instructions (Signed)
Continue work on exercise, weight loss and healthy eating habits.

## 2011-11-02 ENCOUNTER — Other Ambulatory Visit (INDEPENDENT_AMBULATORY_CARE_PROVIDER_SITE_OTHER): Payer: Medicare Other

## 2011-11-02 DIAGNOSIS — I10 Essential (primary) hypertension: Secondary | ICD-10-CM | POA: Diagnosis not present

## 2011-11-02 DIAGNOSIS — E7800 Pure hypercholesterolemia, unspecified: Secondary | ICD-10-CM

## 2011-11-02 DIAGNOSIS — J309 Allergic rhinitis, unspecified: Secondary | ICD-10-CM

## 2011-11-02 DIAGNOSIS — E538 Deficiency of other specified B group vitamins: Secondary | ICD-10-CM

## 2011-11-02 DIAGNOSIS — N400 Enlarged prostate without lower urinary tract symptoms: Secondary | ICD-10-CM

## 2011-11-02 DIAGNOSIS — Z125 Encounter for screening for malignant neoplasm of prostate: Secondary | ICD-10-CM | POA: Diagnosis not present

## 2011-11-02 LAB — HEPATIC FUNCTION PANEL
Albumin: 4 g/dL (ref 3.5–5.2)
Alkaline Phosphatase: 64 U/L (ref 39–117)
Total Protein: 7.2 g/dL (ref 6.0–8.3)

## 2011-11-02 LAB — CBC WITH DIFFERENTIAL/PLATELET
Eosinophils Relative: 5.7 % — ABNORMAL HIGH (ref 0.0–5.0)
HCT: 46.8 % (ref 39.0–52.0)
Hemoglobin: 15.6 g/dL (ref 13.0–17.0)
Lymphs Abs: 2.4 10*3/uL (ref 0.7–4.0)
Monocytes Relative: 7.8 % (ref 3.0–12.0)
Neutro Abs: 5.7 10*3/uL (ref 1.4–7.7)
RBC: 4.84 Mil/uL (ref 4.22–5.81)
WBC: 9.3 10*3/uL (ref 4.5–10.5)

## 2011-11-02 LAB — VITAMIN B12: Vitamin B-12: 66 pg/mL — ABNORMAL LOW (ref 211–911)

## 2011-11-02 LAB — BASIC METABOLIC PANEL
CO2: 34 mEq/L — ABNORMAL HIGH (ref 19–32)
Calcium: 9.5 mg/dL (ref 8.4–10.5)
Creatinine, Ser: 1.3 mg/dL (ref 0.4–1.5)
Glucose, Bld: 128 mg/dL — ABNORMAL HIGH (ref 70–99)
Sodium: 141 mEq/L (ref 135–145)

## 2011-11-02 LAB — LIPID PANEL: HDL: 47.6 mg/dL (ref >39.00)

## 2011-11-06 ENCOUNTER — Ambulatory Visit (INDEPENDENT_AMBULATORY_CARE_PROVIDER_SITE_OTHER): Payer: Medicare Other | Admitting: Family Medicine

## 2011-11-06 ENCOUNTER — Encounter: Payer: Self-pay | Admitting: Family Medicine

## 2011-11-06 DIAGNOSIS — E7800 Pure hypercholesterolemia, unspecified: Secondary | ICD-10-CM | POA: Diagnosis not present

## 2011-11-06 DIAGNOSIS — Z0000 Encounter for general adult medical examination without abnormal findings: Secondary | ICD-10-CM | POA: Diagnosis not present

## 2011-11-06 DIAGNOSIS — R7309 Other abnormal glucose: Secondary | ICD-10-CM | POA: Diagnosis not present

## 2011-11-06 DIAGNOSIS — H2510 Age-related nuclear cataract, unspecified eye: Secondary | ICD-10-CM | POA: Diagnosis not present

## 2011-11-06 DIAGNOSIS — R0789 Other chest pain: Secondary | ICD-10-CM

## 2011-11-06 DIAGNOSIS — I10 Essential (primary) hypertension: Secondary | ICD-10-CM | POA: Diagnosis not present

## 2011-11-06 DIAGNOSIS — E538 Deficiency of other specified B group vitamins: Secondary | ICD-10-CM

## 2011-11-06 DIAGNOSIS — M25511 Pain in right shoulder: Secondary | ICD-10-CM | POA: Insufficient documentation

## 2011-11-06 DIAGNOSIS — M25519 Pain in unspecified shoulder: Secondary | ICD-10-CM | POA: Diagnosis not present

## 2011-11-06 DIAGNOSIS — M25512 Pain in left shoulder: Secondary | ICD-10-CM | POA: Insufficient documentation

## 2011-11-06 MED ORDER — MELOXICAM 15 MG PO TABS: 15.0000 mg | ORAL_TABLET | Freq: Every day | ORAL | Status: DC

## 2011-11-06 MED ORDER — CYANOCOBALAMIN 1000 MCG/ML IJ SOLN
1000.0000 ug | Freq: Once | INTRAMUSCULAR | Status: AC
  Administered 2011-11-06: 1000 ug via INTRAMUSCULAR

## 2011-11-06 NOTE — Progress Notes (Signed)
Subjective:    Patient ID: Caleb Bryant, male    DOB: [birth year], 78 y.o.   MRN: [id number]  HPI I have personally reviewed the Medicare Annual Wellness questionnaire and have noted 1. The patient's medical and social history 2. Their use of alcohol, tobacco or illicit drugs 3. Their current medications and supplements 4. The patient's functional ability including ADL's, fall risks, home safety risks and hearing or visual             impairment. 5. Diet and physical activities 6. Evidence for depression or mood disorders The patients weight, height, BMI and visual acuity have been recorded in the chart I have made referrals, counseling and provided education to the patient based review of the above and I have provided the pt with a written personalized care plan for preventive services.  Has noted B shoulder pain when lifting arms, int and ext rotation x 6 months. Thought originally due to weed eating. Not doing any stretching. Not taking any OTC meds for pain. Not worse when moving neck. Some low back pain, more mild as well. No falls, o injuries. No new meds...stopped prilosec. No numbness, no weakness in hads.   Hypertension:  Well controlled on HCTZ Using medication without problems or lightheadedness:  Chest pain with exertion:none recently.. In summer some chest tightness when pushing lawn mower, no other recently. Edema:None Short of breath:None Average home BPs:not checking Other issues:  Prediabetes.. recent check now shows new diagnosis diabetes.. Will send for A1C.  B12 def: Has not been coming in regularly for B12 injections. Levels remain very low.  Elevated Cholesterol: Not at goal now with new diagnosis DM, goal LDL <100. Diet compliance: Poor eats a lot of sausage, [place], fish, eggs, grits, toast, fatback! Exercise:with outdoor work Other complaints:     Review of Systems  Constitutional: Negative for fever, fatigue and unexpected weight change.    HENT: Negative for ear pain, congestion, sore throat, rhinorrhea, trouble swallowing and postnasal drip.   Eyes: Negative for pain.  Respiratory: Negative for cough, shortness of breath and wheezing.   Cardiovascular: Negative for chest pain, palpitations and leg swelling.  Gastrointestinal: Negative for nausea, abdominal pain, diarrhea, constipation and blood in stool.  Genitourinary: Negative for dysuria, urgency, hematuria, discharge, penile swelling, scrotal swelling, difficulty urinating, penile pain and testicular pain.  Musculoskeletal: Positive for back pain and arthralgias. Negative for myalgias, joint swelling and gait problem.  Skin: Negative for rash.  Neurological: Negative for syncope, weakness, light-headedness, numbness and headaches.  Psychiatric/Behavioral: Negative for behavioral problems and dysphoric mood. The patient is not nervous/anxious.        Objective:   Physical Exam  Constitutional: Vital signs are normal. He appears well-developed and well-nourished.  HENT:  Head: Normocephalic.  Right Ear: Hearing normal.  Left Ear: Hearing normal.  Nose: Nose normal.  Mouth/Throat: Oropharynx is clear and moist and mucous membranes are normal.  Neck: Trachea normal. Carotid bruit is not present. No mass and no thyromegaly present.  Cardiovascular: Normal rate, regular rhythm and normal pulses.  Exam reveals no gallop, no distant heart sounds and no friction rub.   No murmur heard.      No peripheral edema  Pulmonary/Chest: Effort normal and breath sounds normal. No respiratory distress.  Musculoskeletal:       Right shoulder: He exhibits decreased range of motion and tenderness.       Left shoulder: He exhibits decreased range of motion and tenderness.  Pain with int, ext rotation B and abduction B  Positive neers,impingement sign B  Skin: Skin is warm, dry and intact. No rash noted.  Psychiatric: He has a normal mood and affect. His speech is normal and  behavior is normal. Thought content normal.          Assessment & Plan:  AMW: The patient's preventative maintenance and recommended screening tests for an annual wellness exam were reviewed in full today. Brought up to date unless services declined.  Counselled on the importance of diet, exercise, and its role in overall health and mortality. The patient's FH and SH was reviewed, including their home life, tobacco status, and drug and alcohol status.   Vaccines: Uptodate with Td, PNA,Flu, considering shingles vaccine. Colon:Dr. Tollie Pizza, not sure when lastr colonoscopy. Prostate:Not indicated at this age, but pt request continued following PSA, refuses prostate exam... PSA stable from last check. Former smoker, remote, no respiratory symptoms.

## 2011-11-06 NOTE — Patient Instructions (Addendum)
B12 shot given today, return monthly for this. Make appt on your way out for next nurse visit. Call insurance about shingles vaccine. Stop at lab for labs and stool cards. Return stool cards. Start home exercises for shoulders, use meloxicam as needed for pain. New diagnosis of DIABETES, and HIGH CHOLESTEROL! Work on low low cholesterol and diabetes diet. Call if you or wife are interested in nutrition referral. Return for DM follow up in 4 months with fasting labs prior. If any chest pain with exertion reoccurs.. Call for referral to cardiology for further eval.

## 2011-11-06 NOTE — Assessment & Plan Note (Signed)
Likely OA and or rotator cuff issues. Will treat with NSAIDs in limiterd fashoin given remote histroy of ? Stomach issues (aleve does not irritate his stomach), home exercises , follow up prn.

## 2011-11-06 NOTE — Assessment & Plan Note (Signed)
New diagnosis. Poor diet. Refused nutrition referral. Counseled on diet and lifestyle change. Will check A1C today.

## 2011-11-06 NOTE — Assessment & Plan Note (Addendum)
None recent.. Last occurred 6 months ago. EKG: stable from last except mild brady . Has stable RBBB bifisicular block .   If further symptoms given high risk.. Will send for cardiolyte.

## 2011-11-06 NOTE — Assessment & Plan Note (Signed)
Very low.. Get back on track with monthly injections.

## 2011-11-06 NOTE — Assessment & Plan Note (Signed)
Well controlled. Continue current medication.

## 2011-11-06 NOTE — Assessment & Plan Note (Signed)
Poor control.. Will work aggressively on diet changes and recheck in 3-4  Months.

## 2011-11-07 ENCOUNTER — Other Ambulatory Visit: Payer: Self-pay | Admitting: Family Medicine

## 2011-11-07 ENCOUNTER — Other Ambulatory Visit: Payer: Medicare Other

## 2011-11-07 DIAGNOSIS — Z1212 Encounter for screening for malignant neoplasm of rectum: Secondary | ICD-10-CM | POA: Diagnosis not present

## 2011-11-07 LAB — FECAL OCCULT BLOOD, IMMUNOCHEMICAL: Fecal Occult Bld: POSITIVE

## 2011-11-09 ENCOUNTER — Telehealth: Payer: Self-pay | Admitting: Family Medicine

## 2011-11-09 DIAGNOSIS — R195 Other fecal abnormalities: Secondary | ICD-10-CM

## 2011-11-09 NOTE — Telephone Encounter (Signed)
Message copied by [name] on Thu Nov 09, 2011  4:14 PM ------      Message from: Cathlean Cower      Created: Thu Nov 09, 2011  4:12 PM       Patient advised and is okay with colonoscopy and was dr Marlyn Corporal.

## 2011-11-20 DIAGNOSIS — H269 Unspecified cataract: Secondary | ICD-10-CM | POA: Diagnosis not present

## 2011-11-20 DIAGNOSIS — H2510 Age-related nuclear cataract, unspecified eye: Secondary | ICD-10-CM | POA: Diagnosis not present

## 2011-11-21 DIAGNOSIS — H2510 Age-related nuclear cataract, unspecified eye: Secondary | ICD-10-CM | POA: Diagnosis not present

## 2011-11-28 DIAGNOSIS — K921 Melena: Secondary | ICD-10-CM | POA: Diagnosis not present

## 2011-11-28 DIAGNOSIS — K439 Ventral hernia without obstruction or gangrene: Secondary | ICD-10-CM | POA: Diagnosis not present

## 2011-12-01 ENCOUNTER — Ambulatory Visit: Payer: Self-pay | Admitting: General Surgery

## 2011-12-01 DIAGNOSIS — K439 Ventral hernia without obstruction or gangrene: Secondary | ICD-10-CM | POA: Diagnosis not present

## 2011-12-01 DIAGNOSIS — R229 Localized swelling, mass and lump, unspecified: Secondary | ICD-10-CM | POA: Diagnosis not present

## 2011-12-01 DIAGNOSIS — Z9089 Acquired absence of other organs: Secondary | ICD-10-CM | POA: Diagnosis not present

## 2011-12-07 ENCOUNTER — Ambulatory Visit (INDEPENDENT_AMBULATORY_CARE_PROVIDER_SITE_OTHER): Payer: Medicare Other | Admitting: *Deleted

## 2011-12-07 DIAGNOSIS — E538 Deficiency of other specified B group vitamins: Secondary | ICD-10-CM

## 2011-12-07 MED ORDER — CYANOCOBALAMIN 1000 MCG/ML IJ SOLN
1000.0000 ug | Freq: Once | INTRAMUSCULAR | Status: AC
  Administered 2011-12-07: 1000 ug via INTRAMUSCULAR

## 2011-12-11 DIAGNOSIS — H2510 Age-related nuclear cataract, unspecified eye: Secondary | ICD-10-CM | POA: Diagnosis not present

## 2011-12-11 DIAGNOSIS — H269 Unspecified cataract: Secondary | ICD-10-CM | POA: Diagnosis not present

## 2011-12-20 DIAGNOSIS — Z0189 Encounter for other specified special examinations: Secondary | ICD-10-CM | POA: Diagnosis not present

## 2011-12-20 DIAGNOSIS — L819 Disorder of pigmentation, unspecified: Secondary | ICD-10-CM | POA: Diagnosis not present

## 2011-12-20 DIAGNOSIS — L821 Other seborrheic keratosis: Secondary | ICD-10-CM | POA: Diagnosis not present

## 2011-12-20 DIAGNOSIS — L570 Actinic keratosis: Secondary | ICD-10-CM | POA: Diagnosis not present

## 2012-01-09 ENCOUNTER — Ambulatory Visit: Payer: Self-pay | Admitting: General Surgery

## 2012-01-09 DIAGNOSIS — Z79899 Other long term (current) drug therapy: Secondary | ICD-10-CM | POA: Diagnosis not present

## 2012-01-09 DIAGNOSIS — Z1211 Encounter for screening for malignant neoplasm of colon: Secondary | ICD-10-CM | POA: Diagnosis not present

## 2012-01-09 DIAGNOSIS — K439 Ventral hernia without obstruction or gangrene: Secondary | ICD-10-CM | POA: Diagnosis not present

## 2012-01-09 DIAGNOSIS — K921 Melena: Secondary | ICD-10-CM | POA: Diagnosis not present

## 2012-01-09 DIAGNOSIS — K2990 Gastroduodenitis, unspecified, without bleeding: Secondary | ICD-10-CM | POA: Diagnosis not present

## 2012-01-09 DIAGNOSIS — K2970 Gastritis, unspecified, without bleeding: Secondary | ICD-10-CM | POA: Diagnosis not present

## 2012-01-09 DIAGNOSIS — K319 Disease of stomach and duodenum, unspecified: Secondary | ICD-10-CM | POA: Diagnosis not present

## 2012-01-09 DIAGNOSIS — K5730 Diverticulosis of large intestine without perforation or abscess without bleeding: Secondary | ICD-10-CM | POA: Diagnosis not present

## 2012-01-09 DIAGNOSIS — D131 Benign neoplasm of stomach: Secondary | ICD-10-CM | POA: Diagnosis not present

## 2012-01-09 DIAGNOSIS — R195 Other fecal abnormalities: Secondary | ICD-10-CM | POA: Diagnosis not present

## 2012-01-09 DIAGNOSIS — R1310 Dysphagia, unspecified: Secondary | ICD-10-CM | POA: Diagnosis not present

## 2012-01-09 DIAGNOSIS — Z6831 Body mass index (BMI) 31.0-31.9, adult: Secondary | ICD-10-CM | POA: Diagnosis not present

## 2012-01-09 DIAGNOSIS — K219 Gastro-esophageal reflux disease without esophagitis: Secondary | ICD-10-CM | POA: Diagnosis not present

## 2012-01-09 LAB — HM COLONOSCOPY: HM Colonoscopy: NORMAL

## 2012-01-12 LAB — PATHOLOGY REPORT

## 2012-01-23 ENCOUNTER — Encounter: Payer: Self-pay | Admitting: Family Medicine

## 2012-01-29 ENCOUNTER — Encounter: Payer: Self-pay | Admitting: Family Medicine

## 2012-02-06 LAB — HM DIABETES EYE EXAM

## 2012-02-28 ENCOUNTER — Telehealth: Payer: Self-pay | Admitting: Family Medicine

## 2012-02-28 NOTE — Telephone Encounter (Signed)
Message copied by [name] on Wed Feb 28, 2012 10:04 AM ------      Message from: Ellamae Sia      Created: Tue Feb 27, 2012  4:32 PM      Regarding: =       Patient is scheduled for CPX labs, please order future labs, Thanks , Karna Christmas

## 2012-02-29 ENCOUNTER — Other Ambulatory Visit (INDEPENDENT_AMBULATORY_CARE_PROVIDER_SITE_OTHER): Payer: Medicare Other

## 2012-02-29 LAB — COMPREHENSIVE METABOLIC PANEL
ALT: 20 U/L (ref 0–53)
AST: 25 U/L (ref 0–37)
Alkaline Phosphatase: 59 U/L (ref 39–117)
CO2: 32 mEq/L (ref 19–32)
Sodium: 141 mEq/L (ref 135–145)
Total Bilirubin: 1 mg/dL (ref 0.3–1.2)
Total Protein: 7.1 g/dL (ref 6.0–8.3)

## 2012-02-29 LAB — LIPID PANEL
HDL: 48.2 mg/dL (ref >39.00)
LDL Cholesterol: 74 mg/dL (ref 0–99)
VLDL: 13.2 mg/dL (ref 0.0–40.0)

## 2012-02-29 LAB — HEMOGLOBIN A1C: Hgb A1c MFr Bld: 6.2 % (ref 4.6–6.5)

## 2012-03-07 ENCOUNTER — Ambulatory Visit (INDEPENDENT_AMBULATORY_CARE_PROVIDER_SITE_OTHER): Payer: Medicare Other | Admitting: Family Medicine

## 2012-03-07 ENCOUNTER — Encounter: Payer: Self-pay | Admitting: Family Medicine

## 2012-03-07 DIAGNOSIS — E538 Deficiency of other specified B group vitamins: Secondary | ICD-10-CM | POA: Diagnosis not present

## 2012-03-07 DIAGNOSIS — R0789 Other chest pain: Secondary | ICD-10-CM

## 2012-03-07 DIAGNOSIS — E119 Type 2 diabetes mellitus without complications: Secondary | ICD-10-CM | POA: Diagnosis not present

## 2012-03-07 DIAGNOSIS — I10 Essential (primary) hypertension: Secondary | ICD-10-CM | POA: Diagnosis not present

## 2012-03-07 DIAGNOSIS — E7800 Pure hypercholesterolemia, unspecified: Secondary | ICD-10-CM

## 2012-03-07 LAB — HM DIABETES FOOT EXAM

## 2012-03-07 MED ORDER — HYDROCHLOROTHIAZIDE 25 MG PO TABS: 25.0000 mg | ORAL_TABLET | Freq: Every day | ORAL | Status: DC

## 2012-03-07 MED ORDER — CYANOCOBALAMIN 1000 MCG/ML IJ SOLN
1000.0000 ug | Freq: Once | INTRAMUSCULAR | Status: AC
  Administered 2012-03-07: 1000 ug via INTRAMUSCULAR

## 2012-03-07 NOTE — Assessment & Plan Note (Signed)
Encouraged him to come in for B12 shots... May improve memory issues some.

## 2012-03-07 NOTE — Assessment & Plan Note (Signed)
Improved control with weight loss and diet changes.Now at goal.

## 2012-03-07 NOTE — Progress Notes (Signed)
  Subjective:    Patient ID: Caleb Bryant, male    DOB: [birth year], 78 y.o.   MRN: [id number]  HPI  Hypertension: Well controlled on HCTZ  Using medication without problems or lightheadedness:  None Chest pain with exertion:None Edema:None  Short of breath:None  Average home BPs:not checking  Other issues:   Prediabetes.Marland Kitchen last check now shows new diagnosis diabetes..  Well controlled with diet changes.. Has lost 20 lbs on purpose. Has been eating right! Lab Results  Component Value Date   HGBA1C 6.2 02/29/2012     B12 def: Has not been coming in regularly for B12 injections. Levels remain very low.   Elevated Cholesterol: Now at goal LDL <100. Diet compliance: Much improved! Exercise:with outdoor work  Other complaints:   Atypical chest pain at last OV.. Stable EKG. Since then none noted.  B shoulder pain: improved , but still some issues intermittantly. Using aleve in Not bothering him too much.    Review of Systems  Constitutional: Positive for fatigue. Negative for fever.  HENT: Negative for ear pain.   Eyes: Negative for pain.  Gastrointestinal: Positive for abdominal pain. Negative for diarrhea, constipation and blood in stool.       Occ sharp pain in left mid afternoon Recent colonoscopy nml.  Genitourinary: Positive for frequency. Negative for dysuria.  Neurological:       Memory loss stable       Objective:   Physical Exam  Constitutional: Vital signs are normal. He appears well-developed and well-nourished.  HENT:  Head: Normocephalic.  Right Ear: Hearing normal.  Left Ear: Hearing normal.  Nose: Nose normal.  Mouth/Throat: Oropharynx is clear and moist and mucous membranes are normal.  Neck: Trachea normal. Carotid bruit is not present. No mass and no thyromegaly present.  Cardiovascular: Normal rate, regular rhythm and normal pulses.  Exam reveals no gallop, no distant heart sounds and no friction rub.   No murmur heard.      No peripheral edema   Pulmonary/Chest: Effort normal and breath sounds normal. No respiratory distress.  Skin: Skin is warm, dry and intact. No rash noted.  Psychiatric: He has a normal mood and affect. His speech is normal and behavior is normal. Thought content normal.    Diabetic foot exam: Normal inspection No skin breakdown No calluses  Normal DP pulses Normal sensation to light touch and monofilament Nails normal       Assessment & Plan:

## 2012-03-07 NOTE — Patient Instructions (Signed)
Try to keep up with coming in for B12.monthly injections Great work on diet changes and weight loss! Diabetes now at goal, cholesterol now at goal!

## 2012-03-07 NOTE — Assessment & Plan Note (Signed)
Improved control with weight loss and diet changes.Now at goal LDL ,100.

## 2012-03-07 NOTE — Assessment & Plan Note (Signed)
Well controlled. Continue current medication.  

## 2012-03-07 NOTE — Assessment & Plan Note (Signed)
Resovled, but high risk so if symptoms return I will insist on stress test. Pt not interested at this point.

## 2012-04-12 ENCOUNTER — Ambulatory Visit (INDEPENDENT_AMBULATORY_CARE_PROVIDER_SITE_OTHER): Payer: Medicare Other | Admitting: *Deleted

## 2012-04-12 DIAGNOSIS — E538 Deficiency of other specified B group vitamins: Secondary | ICD-10-CM

## 2012-04-12 MED ORDER — CYANOCOBALAMIN 1000 MCG/ML IJ SOLN
1000.0000 ug | Freq: Once | INTRAMUSCULAR | Status: AC
  Administered 2012-04-12: 1000 ug via INTRAMUSCULAR

## 2012-04-19 DIAGNOSIS — H26499 Other secondary cataract, unspecified eye: Secondary | ICD-10-CM | POA: Diagnosis not present

## 2012-04-19 DIAGNOSIS — Z961 Presence of intraocular lens: Secondary | ICD-10-CM | POA: Diagnosis not present

## 2012-04-19 DIAGNOSIS — H18419 Arcus senilis, unspecified eye: Secondary | ICD-10-CM | POA: Diagnosis not present

## 2012-05-16 ENCOUNTER — Ambulatory Visit (INDEPENDENT_AMBULATORY_CARE_PROVIDER_SITE_OTHER): Payer: Medicare Other | Admitting: *Deleted

## 2012-05-16 DIAGNOSIS — E538 Deficiency of other specified B group vitamins: Secondary | ICD-10-CM | POA: Diagnosis not present

## 2012-05-16 MED ORDER — CYANOCOBALAMIN 1000 MCG/ML IJ SOLN
1000.0000 ug | Freq: Once | INTRAMUSCULAR | Status: AC
  Administered 2012-05-16: 1000 ug via INTRAMUSCULAR

## 2012-06-16 DIAGNOSIS — M79609 Pain in unspecified limb: Secondary | ICD-10-CM | POA: Diagnosis not present

## 2012-06-16 DIAGNOSIS — M25579 Pain in unspecified ankle and joints of unspecified foot: Secondary | ICD-10-CM | POA: Diagnosis not present

## 2012-06-16 DIAGNOSIS — M109 Gout, unspecified: Secondary | ICD-10-CM | POA: Diagnosis not present

## 2012-08-08 ENCOUNTER — Ambulatory Visit (INDEPENDENT_AMBULATORY_CARE_PROVIDER_SITE_OTHER): Payer: Self-pay

## 2012-08-08 DIAGNOSIS — Z23 Encounter for immunization: Secondary | ICD-10-CM

## 2012-09-05 ENCOUNTER — Other Ambulatory Visit (INDEPENDENT_AMBULATORY_CARE_PROVIDER_SITE_OTHER): Payer: Medicare Other

## 2012-09-05 ENCOUNTER — Telehealth: Payer: Self-pay | Admitting: Family Medicine

## 2012-09-05 DIAGNOSIS — E7800 Pure hypercholesterolemia, unspecified: Secondary | ICD-10-CM

## 2012-09-05 DIAGNOSIS — E119 Type 2 diabetes mellitus without complications: Secondary | ICD-10-CM

## 2012-09-05 DIAGNOSIS — E538 Deficiency of other specified B group vitamins: Secondary | ICD-10-CM

## 2012-09-05 DIAGNOSIS — I10 Essential (primary) hypertension: Secondary | ICD-10-CM

## 2012-09-05 DIAGNOSIS — E1151 Type 2 diabetes mellitus with diabetic peripheral angiopathy without gangrene: Secondary | ICD-10-CM | POA: Insufficient documentation

## 2012-09-05 LAB — COMPREHENSIVE METABOLIC PANEL
ALT: 18 U/L (ref 0–53)
CO2: 34 mEq/L — ABNORMAL HIGH (ref 19–32)
Creatinine, Ser: 1.2 mg/dL (ref 0.4–1.5)
GFR: 61.26 mL/min (ref >60.00)
Total Bilirubin: 0.8 mg/dL (ref 0.3–1.2)

## 2012-09-05 LAB — LIPID PANEL
HDL: 40.1 mg/dL (ref >39.00)
Total CHOL/HDL Ratio: 4
Triglycerides: 66 mg/dL (ref 0.0–149.0)

## 2012-09-05 NOTE — Telephone Encounter (Signed)
Message copied by [name] on Thu Sep 05, 2012  8:41 AM ------      Message from: Marchia Bond      Created: Thu Aug 29, 2012  1:26 PM      Regarding: cpx labs Thurs 09/05/12       Please order  future cpx labs for pt's upcomming lab appt.      Thanks      Aniceto Boss

## 2012-09-19 ENCOUNTER — Ambulatory Visit (INDEPENDENT_AMBULATORY_CARE_PROVIDER_SITE_OTHER): Payer: Medicare Other | Admitting: Family Medicine

## 2012-09-19 ENCOUNTER — Encounter: Payer: Self-pay | Admitting: Family Medicine

## 2012-09-19 VITALS — BP 130/80 | HR 66 | Temp 98.3°F | Ht 66.0 in | Wt 186.4 lb

## 2012-09-19 DIAGNOSIS — Z0000 Encounter for general adult medical examination without abnormal findings: Secondary | ICD-10-CM

## 2012-09-19 DIAGNOSIS — F0390 Unspecified dementia without behavioral disturbance: Secondary | ICD-10-CM

## 2012-09-19 DIAGNOSIS — E119 Type 2 diabetes mellitus without complications: Secondary | ICD-10-CM | POA: Diagnosis not present

## 2012-09-19 DIAGNOSIS — E7800 Pure hypercholesterolemia, unspecified: Secondary | ICD-10-CM

## 2012-09-19 DIAGNOSIS — I10 Essential (primary) hypertension: Secondary | ICD-10-CM

## 2012-09-19 DIAGNOSIS — M1A9XX1 Chronic gout, unspecified, with tophus (tophi): Secondary | ICD-10-CM | POA: Insufficient documentation

## 2012-09-19 DIAGNOSIS — M109 Gout, unspecified: Secondary | ICD-10-CM | POA: Diagnosis not present

## 2012-09-19 DIAGNOSIS — E538 Deficiency of other specified B group vitamins: Secondary | ICD-10-CM

## 2012-09-19 MED ORDER — DONEPEZIL HCL 5 MG PO TABS: 5.0000 mg | ORAL_TABLET | Freq: Every day | ORAL | Status: DC

## 2012-09-19 NOTE — Assessment & Plan Note (Signed)
Diet controlled. Trying to work on low uric acid diet.

## 2012-09-19 NOTE — Progress Notes (Signed)
HPI  I have personally reviewed the Medicare Annual Wellness questionnaire and have noted  1. The patient's medical and social history  2. Their use of alcohol, tobacco or illicit drugs  3. Their current medications and supplements  4. The patient's functional ability including ADL's, fall risks, home safety risks and hearing or visual  impairment.  5. Diet and physical activities  6. Evidence for depression or mood disorders  The patients weight, height, BMI and visual acuity have been recorded in the chart  I have made referrals, counseling and provided education to the patient based review of the above and I have provided the pt with a written personalized care plan for preventive services.   He reports that he is more fatigued lately. He is having body  Pain, joints and muscles after yard work. Has noted in last year.  He is not on any statin. Has some dioclofenac, but did not take lately.  [place]   Occ noting numbness in fingers and numbness in nose.  Hypertension: Well controlled on HCTZ  Using medication without problems or lightheadedness:  Chest pain with exertion:none recently.. In summer some chest tightness when pushing lawn mower, no other recently.  Edema:None  Short of breath:None  Average home BPs:not checking  Other issues:   Diabetes:diet controlled Lab Results  Component Value Date   HGBA1C 6.2 09/05/2012  Eye exam: cataract surgery.. 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 Levels remain very low.   Elevated Cholesterol: At goal  LDL <100.  Lab Results  Component Value Date   CHOL 145 09/05/2012   HDL 40.10 09/05/2012   LDLCALC 92 09/05/2012   TRIG 66.0 09/05/2012   CHOLHDL 4 09/05/2012  Diet compliance: Poor eats a lot of sausage, [place], fish, eggs, grits, toast, fatback!  He is trying to improve in last 6 months. Exercise:with outdoor work  Other complaints:    Review of Systems  Constitutional: Negative for fever and unexpected weight change.  HENT: Negative for ear pain, congestion, sore throat, rhinorrhea, trouble swallowing and postnasal drip.  Eyes: Negative for pain.  Respiratory: Negative for cough, shortness of breath and wheezing.  Cardiovascular: Negative for chest pain, palpitations and leg swelling.  Gastrointestinal: Negative for nausea, abdominal pain, diarrhea, constipation and blood in stool.  Genitourinary: Negative for dysuria, urgency, hematuria, discharge, penile swelling, scrotal swelling, difficulty urinating, penile pain and testicular pain.  Musculoskeletal: Positive for back pain and arthralgias. Negative for myalgias, joint swelling and gait problem.  Skin: Negative for rash.  Neurological: Negative for syncope, weakness, light-headedness, numbness and headaches.  Psychiatric/Behavioral: Negative for behavioral problems and dysphoric mood. The patient is not nervous/anxious.    Objective:   Physical Exam  Constitutional: Vital signs are normal. He appears well-developed and well-nourished.  HENT:  Head: Normocephalic.  Right Ear: Hearing normal.  Left Ear: Hearing normal.  Nose: Nose normal.  Mouth/Throat: Oropharynx is clear and moist and mucous membranes are normal.  Neck: Trachea normal. Carotid bruit is not present. No mass and no thyromegaly present.  Cardiovascular: Normal rate, regular rhythm and normal pulses. Exam reveals no gallop, no distant heart sounds and no friction rub.  No murmur heard. No peripheral edema  Pulmonary/Chest: Effort normal and breath sounds normal. No respiratory distress.  Musculoskeletal:  Skin: Skin is warm, dry and intact. No rash noted.  Psychiatric: He has a normal mood and affect. His speech is  normal and behavior is normal. Thought content normal.    Assessment & Plan:   AMW: The patient's preventative maintenance and recommended screening tests for an annual wellness  exam were reviewed in full today.  Brought up to date unless services declined.  Counselled on the importance of diet, exercise, and its role in overall health and mortality.  The patient's FH and SH was reviewed, including their home life, tobacco status, and drug and alcohol status.   Vaccines: Uptodate with Td, PNA,Flu, considering shingles vaccine.  Colon: Ifob pos last year, had nml egd and colonoscopy, repeat in 10 years Prostate:Not indicated at this age. Former smoker, remote, no respiratory symptoms.

## 2012-09-19 NOTE — Patient Instructions (Addendum)
We will give you B12  Shot today. Work on low uric acid diet. We will likely start allopurinol at next office visit. You stated today that you are not interested in a medication for your memory like aricept. Work on low fat, low carb diet. Return in 2 weeks for follow up with labs prior to check uric acid and B12.

## 2012-10-03 ENCOUNTER — Ambulatory Visit: Payer: Medicare Other | Admitting: Family Medicine

## 2012-10-03 ENCOUNTER — Other Ambulatory Visit (INDEPENDENT_AMBULATORY_CARE_PROVIDER_SITE_OTHER): Payer: Medicare Other

## 2012-10-03 ENCOUNTER — Ambulatory Visit: Payer: Medicare Other

## 2012-10-03 DIAGNOSIS — E538 Deficiency of other specified B group vitamins: Secondary | ICD-10-CM | POA: Diagnosis not present

## 2012-10-03 DIAGNOSIS — M109 Gout, unspecified: Secondary | ICD-10-CM

## 2012-10-08 ENCOUNTER — Encounter: Payer: Self-pay | Admitting: Family Medicine

## 2012-10-08 ENCOUNTER — Ambulatory Visit (INDEPENDENT_AMBULATORY_CARE_PROVIDER_SITE_OTHER): Payer: Medicare Other | Admitting: Family Medicine

## 2012-10-08 VITALS — BP 120/82 | HR 61 | Temp 98.2°F | Ht 66.0 in | Wt 189.0 lb

## 2012-10-08 DIAGNOSIS — M1990 Unspecified osteoarthritis, unspecified site: Secondary | ICD-10-CM

## 2012-10-08 DIAGNOSIS — M109 Gout, unspecified: Secondary | ICD-10-CM | POA: Diagnosis not present

## 2012-10-08 DIAGNOSIS — E538 Deficiency of other specified B group vitamins: Secondary | ICD-10-CM

## 2012-10-08 DIAGNOSIS — F0390 Unspecified dementia without behavioral disturbance: Secondary | ICD-10-CM | POA: Diagnosis not present

## 2012-10-08 MED ORDER — COLCHICINE 0.6 MG PO TABS: ORAL_TABLET | ORAL | Status: DC

## 2012-10-08 MED ORDER — CYANOCOBALAMIN 1000 MCG/ML IJ SOLN
1000.0000 ug | Freq: Once | INTRAMUSCULAR | Status: AC
  Administered 2012-10-08: 1000 ug via INTRAMUSCULAR

## 2012-10-08 NOTE — Assessment & Plan Note (Signed)
Likely cause of shoulder pain and knee pain . He will try NSAID, stretching info and call if not improving.

## 2012-10-08 NOTE — Assessment & Plan Note (Signed)
Uric acid level only mildly elevated. PAin now in bunion on right great toe... ? Pain currently gout vs bunion issue.  Will provide colchicine prn. Hold on allopurinol for now.

## 2012-10-08 NOTE — Progress Notes (Signed)
  Subjective:    Patient ID: Caleb Bryant, male    DOB: 03/13/1934, 78 y.o.   MRN: [id number]  HPI 78 year old male returns for 2 week follow up.   Gout: uric acid 7.1. He reports that he is starting to have a flare in his right toe. Achy around bunion.  Dementia: Stopped Aricept because it gave him a headache and worsening confusion.   He is not interested in any other med trial at this time. He is not bothered by his memory.   B12 deficiency: Levels still sub normal despite B12 inj 2 weeks ago.      Review of Systems  Constitutional: Negative for fever and chills.  Respiratory: Negative for shortness of breath.   Cardiovascular: Negative for chest pain.  Musculoskeletal:       Right shoulder pain  Neurological: Positive for weakness and numbness.       Memory issues       Objective:   Physical Exam  Constitutional: He is oriented to person, place, and time. Vital signs are normal. He appears well-developed and well-nourished.  HENT:  Head: Normocephalic.  Right Ear: Hearing normal.  Left Ear: Hearing normal.  Nose: Nose normal.  Mouth/Throat: Oropharynx is clear and moist and mucous membranes are normal.  Neck: Trachea normal. Carotid bruit is not present. No mass and no thyromegaly present.  Cardiovascular: Normal rate, regular rhythm and normal pulses.  Exam reveals no gallop, no distant heart sounds and no friction rub.   No murmur heard.      No peripheral edema  Pulmonary/Chest: Effort normal and breath sounds normal. No respiratory distress.  Musculoskeletal:       Slight redness, no warmth, minimal pain in right great toe MCP joint. Bunion present.  Neurological: He is alert and oriented to person, place, and time. He has normal reflexes.  Skin: Skin is warm, dry and intact. No rash noted.  Psychiatric: He has a normal mood and affect. His speech is normal and behavior is normal. Thought content normal.          Assessment & Plan:

## 2012-10-08 NOTE — Assessment & Plan Note (Signed)
Not interested in any other med at this time.

## 2012-10-08 NOTE — Patient Instructions (Addendum)
You were B12 injection today. Please schedule MR Caleb Bryant every two week B12 appts for the next 2 months. Then schedule a lab appt after that for B12 level. Use colchicone for any gout flares. Call if meloxicam does not help with shoulder pain. For further evaluation.

## 2012-10-08 NOTE — Addendum Note (Signed)
Addended by: Cathlean Cower on: 10/08/2012 10:01 AM   Modules accepted: Orders

## 2012-10-08 NOTE — Assessment & Plan Note (Signed)
Has not gone up significantly in last 2 weeks with B12 injection.. Will get on B12 q 2 week schedule for next 2 months then recheck.

## 2012-10-15 NOTE — Assessment & Plan Note (Signed)
Poor control. Will try q2 week injections to see if we can get his levels up to help with symptoms,

## 2012-10-15 NOTE — Assessment & Plan Note (Signed)
Work on low uric acid diet. We will likely start allopurinol at next office visit if uric acid elevated >7.

## 2012-10-15 NOTE — Assessment & Plan Note (Signed)
Well controlled. Continue current medication.

## 2012-10-24 ENCOUNTER — Ambulatory Visit (INDEPENDENT_AMBULATORY_CARE_PROVIDER_SITE_OTHER): Payer: Medicare Other | Admitting: *Deleted

## 2012-10-24 DIAGNOSIS — E538 Deficiency of other specified B group vitamins: Secondary | ICD-10-CM

## 2012-10-24 MED ORDER — CYANOCOBALAMIN 1000 MCG/ML IJ SOLN
1000.0000 ug | Freq: Once | INTRAMUSCULAR | Status: AC
  Administered 2012-10-24: 1000 ug via INTRAMUSCULAR

## 2012-11-07 ENCOUNTER — Ambulatory Visit (INDEPENDENT_AMBULATORY_CARE_PROVIDER_SITE_OTHER): Payer: Medicare Other | Admitting: *Deleted

## 2012-11-07 DIAGNOSIS — E538 Deficiency of other specified B group vitamins: Secondary | ICD-10-CM | POA: Diagnosis not present

## 2012-11-07 MED ORDER — CYANOCOBALAMIN 1000 MCG/ML IJ SOLN
1000.0000 ug | Freq: Once | INTRAMUSCULAR | Status: AC
  Administered 2012-11-07: 1000 ug via INTRAMUSCULAR

## 2012-11-21 ENCOUNTER — Ambulatory Visit (INDEPENDENT_AMBULATORY_CARE_PROVIDER_SITE_OTHER): Payer: Medicare Other | Admitting: *Deleted

## 2012-11-21 DIAGNOSIS — E538 Deficiency of other specified B group vitamins: Secondary | ICD-10-CM

## 2012-11-21 MED ORDER — CYANOCOBALAMIN 1000 MCG/ML IJ SOLN
1000.0000 ug | Freq: Once | INTRAMUSCULAR | Status: AC
  Administered 2012-11-21: 1000 ug via INTRAMUSCULAR

## 2012-12-05 ENCOUNTER — Ambulatory Visit (INDEPENDENT_AMBULATORY_CARE_PROVIDER_SITE_OTHER): Payer: Medicare Other | Admitting: *Deleted

## 2012-12-05 DIAGNOSIS — E538 Deficiency of other specified B group vitamins: Secondary | ICD-10-CM

## 2012-12-05 MED ORDER — CYANOCOBALAMIN 1000 MCG/ML IJ SOLN
1000.0000 ug | Freq: Once | INTRAMUSCULAR | Status: AC
  Administered 2012-12-05: 1000 ug via INTRAMUSCULAR

## 2012-12-12 ENCOUNTER — Other Ambulatory Visit: Payer: Medicare Other

## 2012-12-16 ENCOUNTER — Other Ambulatory Visit (INDEPENDENT_AMBULATORY_CARE_PROVIDER_SITE_OTHER): Payer: Medicare Other

## 2012-12-16 DIAGNOSIS — E538 Deficiency of other specified B group vitamins: Secondary | ICD-10-CM

## 2012-12-31 ENCOUNTER — Ambulatory Visit (INDEPENDENT_AMBULATORY_CARE_PROVIDER_SITE_OTHER): Payer: Medicare Other | Admitting: Family Medicine

## 2012-12-31 ENCOUNTER — Ambulatory Visit (INDEPENDENT_AMBULATORY_CARE_PROVIDER_SITE_OTHER)
Admission: RE | Admit: 2012-12-31 | Discharge: 2012-12-31 | Disposition: A | Discharge status: Home / Self Care | Payer: Medicare Other | Source: Ambulatory Visit | Attending: Family Medicine | Admitting: Family Medicine

## 2012-12-31 ENCOUNTER — Encounter: Payer: Self-pay | Admitting: Family Medicine

## 2012-12-31 VITALS — BP 120/84 | HR 84 | Temp 98.2°F | Ht 66.0 in | Wt 197.8 lb

## 2012-12-31 DIAGNOSIS — E538 Deficiency of other specified B group vitamins: Secondary | ICD-10-CM

## 2012-12-31 DIAGNOSIS — G609 Hereditary and idiopathic neuropathy, unspecified: Secondary | ICD-10-CM

## 2012-12-31 DIAGNOSIS — E1340 Other specified diabetes mellitus with diabetic neuropathy, unspecified: Secondary | ICD-10-CM | POA: Insufficient documentation

## 2012-12-31 DIAGNOSIS — M542 Cervicalgia: Secondary | ICD-10-CM | POA: Diagnosis not present

## 2012-12-31 DIAGNOSIS — M47812 Spondylosis without myelopathy or radiculopathy, cervical region: Secondary | ICD-10-CM | POA: Diagnosis not present

## 2012-12-31 DIAGNOSIS — M5030 Other cervical disc degeneration, unspecified cervical region: Secondary | ICD-10-CM | POA: Diagnosis not present

## 2012-12-31 MED ORDER — GABAPENTIN 100 MG PO CAPS: ORAL_CAPSULE | ORAL | Status: DC

## 2012-12-31 MED ORDER — CYANOCOBALAMIN 1000 MCG/ML IJ SOLN
1000.0000 ug | Freq: Once | INTRAMUSCULAR | Status: DC
  Administered 2012-12-31: 1000 ug via INTRAMUSCULAR

## 2012-12-31 NOTE — Assessment & Plan Note (Signed)
Will eval with plain films. May have spinal stenosis contributing to neurologic symptoms.

## 2012-12-31 NOTE — Assessment & Plan Note (Signed)
No DM, no resolution with B12 improvement. Will eval neck and send for nerve conduction.

## 2012-12-31 NOTE — Addendum Note (Signed)
Addended by: Cathlean Cower on: 12/31/2012 11:16 AM   Modules accepted: Orders

## 2012-12-31 NOTE — Addendum Note (Signed)
Addended by: Eliezer Lofts E on: 12/31/2012 05:26 PM   Modules accepted: Orders

## 2012-12-31 NOTE — Progress Notes (Signed)
  Subjective:    Patient ID: Caleb Bryant, male    DOB: November 07, 1933, 80 y.o.   MRN: [id number]  HPI  79 year old male with mild dementia returns with continued numbness and tingling in feet.  He also noteds some tingling and numbness in right hand when he drives.  In past lab eval B12 has been found very low, but now after consistent repletion every 2 weeks he continues to have the issue of peripheral neuropathy. 2 weeks ago B12 was checked and was found to be in nml range. Nothing makes it worse.  Nothing helps the numbness  He describes numbness in B toes, occ runs up left leg. He does have neck and shoulder pain B. Told in past he may have spurs in his cervical spine. 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 Arthritis medication has not helped much with pain. Aleve helps a small bit.     Review of Systems  Constitutional: Negative for fever and fatigue.  HENT: Negative for nosebleeds.   Eyes: Negative for pain.  Respiratory: Negative for shortness of breath.   Cardiovascular: Negative for chest pain.  Gastrointestinal: Positive for abdominal pain.       Objective:   Physical Exam  Constitutional: Vital signs are normal. He appears well-developed and well-nourished.  HENT:  Head: Normocephalic.  Right Ear: Hearing normal.  Left Ear: Hearing normal.  Nose: Nose normal.  Mouth/Throat: Oropharynx is clear and moist and mucous membranes are normal.  Neck: Trachea normal. Carotid bruit is not present. No mass and no thyromegaly present.  Cardiovascular: Normal rate, regular rhythm and normal pulses.  Exam reveals no gallop, no distant heart sounds and no friction rub.   No murmur heard. No peripheral edema  Pulmonary/Chest: Effort normal and breath sounds normal. No respiratory distress.  Musculoskeletal:       Right shoulder: He exhibits normal range of motion and no tenderness.       Left shoulder: He exhibits normal range of motion and no  tenderness.       Cervical back: He exhibits decreased range of motion. He exhibits no tenderness and no bony tenderness.       Lumbar back: Normal. He exhibits normal range of motion, no tenderness and no bony tenderness.  Neurological: He has normal strength. No cranial nerve deficit or sensory deficit. He displays a negative Romberg sign. Coordination normal.  Nml monofilament.. Points to sole of foot and toes where numbness is  Mildly positive spurling's B  Skin: Skin is warm, dry and intact. No rash noted.  Psychiatric: He has a normal mood and affect. His speech is normal and behavior is normal. Thought content normal.          Assessment & Plan:

## 2012-12-31 NOTE — Patient Instructions (Addendum)
W will let you know results of neck film and nerve conduction test. Try neurontin for numbness pain in feet. Can increase to 200 mg if no sedation  Or no improvement with this medicaiton after 1 week.

## 2013-01-01 ENCOUNTER — Telehealth: Payer: Self-pay | Admitting: Family Medicine

## 2013-01-01 DIAGNOSIS — G609 Hereditary and idiopathic neuropathy, unspecified: Secondary | ICD-10-CM

## 2013-01-01 DIAGNOSIS — M542 Cervicalgia: Secondary | ICD-10-CM

## 2013-01-01 NOTE — Telephone Encounter (Signed)
Message copied by [name] on Wed Jan 01, 2013  5:58 PM ------      Message from: Cathlean Cower      Created: Wed Jan 01, 2013  8:49 AM       Patient advised and is okay with MRI patient would like to go to [place]. ------

## 2013-01-10 ENCOUNTER — Encounter: Payer: Self-pay | Admitting: Family Medicine

## 2013-01-10 ENCOUNTER — Ambulatory Visit: Payer: Self-pay | Admitting: Family Medicine

## 2013-01-10 DIAGNOSIS — Z1389 Encounter for screening for other disorder: Secondary | ICD-10-CM | POA: Diagnosis not present

## 2013-01-10 DIAGNOSIS — M5020 Other cervical disc displacement, unspecified cervical region: Secondary | ICD-10-CM | POA: Diagnosis not present

## 2013-01-10 DIAGNOSIS — M4802 Spinal stenosis, cervical region: Secondary | ICD-10-CM | POA: Diagnosis not present

## 2013-01-14 ENCOUNTER — Telehealth: Payer: Self-pay | Admitting: Family Medicine

## 2013-01-14 ENCOUNTER — Other Ambulatory Visit: Payer: Self-pay | Admitting: Family Medicine

## 2013-01-14 DIAGNOSIS — G609 Hereditary and idiopathic neuropathy, unspecified: Secondary | ICD-10-CM

## 2013-01-14 DIAGNOSIS — M542 Cervicalgia: Secondary | ICD-10-CM

## 2013-01-14 DIAGNOSIS — M25511 Pain in right shoulder: Secondary | ICD-10-CM

## 2013-01-14 DIAGNOSIS — M25512 Pain in left shoulder: Secondary | ICD-10-CM

## 2013-01-14 NOTE — Telephone Encounter (Signed)
Message copied by Eliezer Lofts E on Tue Jan 14, 2013  2:12 PM ------      Message from: Ricki Miller      Created: Mon Jan 13, 2013  9:04 AM       Advised patient's wife of results, nerve conduction appt cancelled.  Pt does want referral, wants to see someone in [place].  Wife states that if patient is referred to neurologist- which I told her I wasn't sure if referral would be to neurologist or ortho, then patient wants to see Dr. Arnoldo Morale. ------

## 2013-01-24 DIAGNOSIS — M4802 Spinal stenosis, cervical region: Secondary | ICD-10-CM | POA: Diagnosis not present

## 2013-01-24 DIAGNOSIS — M5412 Radiculopathy, cervical region: Secondary | ICD-10-CM | POA: Diagnosis not present

## 2013-01-24 DIAGNOSIS — M542 Cervicalgia: Secondary | ICD-10-CM | POA: Diagnosis not present

## 2013-01-24 DIAGNOSIS — M4712 Other spondylosis with myelopathy, cervical region: Secondary | ICD-10-CM | POA: Diagnosis not present

## 2013-03-31 DIAGNOSIS — Z01818 Encounter for other preprocedural examination: Secondary | ICD-10-CM | POA: Diagnosis not present

## 2013-03-31 DIAGNOSIS — M5030 Other cervical disc degeneration, unspecified cervical region: Secondary | ICD-10-CM | POA: Insufficient documentation

## 2013-04-08 DIAGNOSIS — Z79899 Other long term (current) drug therapy: Secondary | ICD-10-CM | POA: Diagnosis not present

## 2013-04-08 DIAGNOSIS — H18519 Endothelial corneal dystrophy, unspecified eye: Secondary | ICD-10-CM | POA: Diagnosis not present

## 2013-04-09 DIAGNOSIS — Z947 Corneal transplant status: Secondary | ICD-10-CM | POA: Insufficient documentation

## 2013-04-10 ENCOUNTER — Encounter: Payer: Self-pay | Admitting: Family Medicine

## 2013-04-16 DIAGNOSIS — H1130 Conjunctival hemorrhage, unspecified eye: Secondary | ICD-10-CM | POA: Diagnosis not present

## 2013-05-19 ENCOUNTER — Other Ambulatory Visit: Payer: Self-pay | Admitting: Family Medicine

## 2013-09-01 DIAGNOSIS — Z23 Encounter for immunization: Secondary | ICD-10-CM | POA: Diagnosis not present

## 2013-11-04 ENCOUNTER — Inpatient Hospital Stay (HOSPITAL_COMMUNITY)
Admission: EM | Admit: 2013-11-04 | Discharge: 2013-11-06 | DRG: 287 | Disposition: A | Discharge status: Home / Self Care | Payer: Medicare Other | Attending: Cardiovascular Disease | Admitting: Cardiovascular Disease

## 2013-11-04 ENCOUNTER — Ambulatory Visit (INDEPENDENT_AMBULATORY_CARE_PROVIDER_SITE_OTHER): Payer: Medicare Other | Admitting: Family Medicine

## 2013-11-04 ENCOUNTER — Emergency Department (HOSPITAL_COMMUNITY): Payer: Medicare Other

## 2013-11-04 ENCOUNTER — Encounter: Payer: Self-pay | Admitting: Family Medicine

## 2013-11-04 ENCOUNTER — Ambulatory Visit (INDEPENDENT_AMBULATORY_CARE_PROVIDER_SITE_OTHER): Payer: Medicare Other | Admitting: Cardiovascular Disease

## 2013-11-04 ENCOUNTER — Encounter (HOSPITAL_COMMUNITY): Payer: Self-pay | Admitting: Emergency Medicine

## 2013-11-04 ENCOUNTER — Encounter: Payer: Self-pay | Admitting: Cardiovascular Disease

## 2013-11-04 ENCOUNTER — Emergency Department (HOSPITAL_COMMUNITY)
Admission: EM | Admit: 2013-11-04 | Discharge: 2013-11-04 | Disposition: A | Discharge status: Home / Self Care | Payer: Medicare Other | Source: Home / Self Care | Attending: Emergency Medicine | Admitting: Emergency Medicine

## 2013-11-04 VITALS — BP 136/101 | HR 145 | Ht 66.0 in | Wt 201.0 lb

## 2013-11-04 VITALS — BP 110/80 | HR 97 | Temp 97.6°F | Ht 66.0 in | Wt 199.5 lb

## 2013-11-04 DIAGNOSIS — E538 Deficiency of other specified B group vitamins: Secondary | ICD-10-CM

## 2013-11-04 DIAGNOSIS — R0989 Other specified symptoms and signs involving the circulatory and respiratory systems: Secondary | ICD-10-CM

## 2013-11-04 DIAGNOSIS — Z7901 Long term (current) use of anticoagulants: Secondary | ICD-10-CM

## 2013-11-04 DIAGNOSIS — R079 Chest pain, unspecified: Secondary | ICD-10-CM | POA: Diagnosis not present

## 2013-11-04 DIAGNOSIS — R0609 Other forms of dyspnea: Secondary | ICD-10-CM

## 2013-11-04 DIAGNOSIS — Z8249 Family history of ischemic heart disease and other diseases of the circulatory system: Secondary | ICD-10-CM | POA: Diagnosis not present

## 2013-11-04 DIAGNOSIS — Z79899 Other long term (current) drug therapy: Secondary | ICD-10-CM | POA: Diagnosis not present

## 2013-11-04 DIAGNOSIS — M1990 Unspecified osteoarthritis, unspecified site: Secondary | ICD-10-CM | POA: Diagnosis not present

## 2013-11-04 DIAGNOSIS — E119 Type 2 diabetes mellitus without complications: Secondary | ICD-10-CM | POA: Diagnosis not present

## 2013-11-04 DIAGNOSIS — R0602 Shortness of breath: Secondary | ICD-10-CM | POA: Diagnosis not present

## 2013-11-04 DIAGNOSIS — I4892 Unspecified atrial flutter: Principal | ICD-10-CM

## 2013-11-04 DIAGNOSIS — E785 Hyperlipidemia, unspecified: Secondary | ICD-10-CM | POA: Diagnosis not present

## 2013-11-04 DIAGNOSIS — Z87891 Personal history of nicotine dependence: Secondary | ICD-10-CM

## 2013-11-04 DIAGNOSIS — R000 Tachycardia, unspecified: Secondary | ICD-10-CM | POA: Diagnosis not present

## 2013-11-04 DIAGNOSIS — Z9089 Acquired absence of other organs: Secondary | ICD-10-CM | POA: Diagnosis not present

## 2013-11-04 DIAGNOSIS — I2510 Atherosclerotic heart disease of native coronary artery without angina pectoris: Secondary | ICD-10-CM | POA: Diagnosis not present

## 2013-11-04 DIAGNOSIS — I739 Peripheral vascular disease, unspecified: Secondary | ICD-10-CM | POA: Diagnosis not present

## 2013-11-04 DIAGNOSIS — I10 Essential (primary) hypertension: Secondary | ICD-10-CM | POA: Diagnosis present

## 2013-11-04 DIAGNOSIS — Z947 Corneal transplant status: Secondary | ICD-10-CM

## 2013-11-04 DIAGNOSIS — M109 Gout, unspecified: Secondary | ICD-10-CM | POA: Diagnosis not present

## 2013-11-04 DIAGNOSIS — I200 Unstable angina: Secondary | ICD-10-CM

## 2013-11-04 DIAGNOSIS — N400 Enlarged prostate without lower urinary tract symptoms: Secondary | ICD-10-CM | POA: Diagnosis present

## 2013-11-04 DIAGNOSIS — F411 Generalized anxiety disorder: Secondary | ICD-10-CM | POA: Diagnosis present

## 2013-11-04 HISTORY — DX: Unspecified atrial flutter: I48.92

## 2013-11-04 HISTORY — DX: Gout, unspecified: M10.9

## 2013-11-04 HISTORY — DX: Prediabetes: R73.03

## 2013-11-04 HISTORY — DX: Essential (primary) hypertension: I10

## 2013-11-04 HISTORY — PX: OTHER SURGICAL HISTORY: SHX169

## 2013-11-04 HISTORY — DX: Anxiety disorder, unspecified: F41.9

## 2013-11-04 HISTORY — DX: Shortness of breath: R06.02

## 2013-11-04 LAB — CBC WITH DIFFERENTIAL/PLATELET
Basophils Absolute: 0 10*3/uL (ref 0.0–0.1)
Basophils Absolute: 0 10*3/uL (ref 0.0–0.1)
Basophils Relative: 1 % (ref 0–1)
Basophils Relative: 0 % (ref 0–1)
EOS ABS: 0.5 10*3/uL (ref 0.0–0.7)
EOS PCT: 6 % — AB (ref 0–5)
Eosinophils Absolute: 0.6 10*3/uL (ref 0.0–0.7)
Eosinophils Relative: 6 % — ABNORMAL HIGH (ref 0–5)
HCT: 42.5 % (ref 39.0–52.0)
HEMATOCRIT: 41 % (ref 39.0–52.0)
HEMOGLOBIN: 14.7 g/dL (ref 13.0–17.0)
Hemoglobin: 14 g/dL (ref 13.0–17.0)
LYMPHS ABS: 2.5 10*3/uL (ref 0.7–4.0)
Lymphocytes Relative: 30 % (ref 12–46)
Lymphocytes Relative: 31 % (ref 12–46)
Lymphs Abs: 3 10*3/uL (ref 0.7–4.0)
MCH: 31.6 pg (ref 26.0–34.0)
MCH: 32.2 pg (ref 26.0–34.0)
MCHC: 34.1 g/dL (ref 30.0–36.0)
MCHC: 34.6 g/dL (ref 30.0–36.0)
MCV: 92.6 fL (ref 78.0–100.0)
MCV: 93 fL (ref 78.0–100.0)
MONO ABS: 0.8 10*3/uL (ref 0.1–1.0)
MONOS PCT: 8 % (ref 3–12)
Monocytes Absolute: 0.8 10*3/uL (ref 0.1–1.0)
Monocytes Relative: 10 % (ref 3–12)
NEUTROS ABS: 5.4 10*3/uL (ref 1.7–7.7)
Neutro Abs: 4.5 10*3/uL (ref 1.7–7.7)
Neutrophils Relative %: 53 % (ref 43–77)
Neutrophils Relative %: 55 % (ref 43–77)
PLATELETS: 205 10*3/uL (ref 150–400)
Platelets: 228 10*3/uL (ref 150–400)
RBC: 4.43 MIL/uL (ref 4.22–5.81)
RBC: 4.57 MIL/uL (ref 4.22–5.81)
RDW: 14.3 % (ref 11.5–15.5)
RDW: 14.3 % (ref 11.5–15.5)
WBC: 8.3 10*3/uL (ref 4.0–10.5)
WBC: 9.8 10*3/uL (ref 4.0–10.5)

## 2013-11-04 LAB — BASIC METABOLIC PANEL
BUN: 21 mg/dL (ref 6–23)
BUN: 19 mg/dL (ref 6–23)
CALCIUM: 9 mg/dL (ref 8.4–10.5)
CALCIUM: 8.6 mg/dL (ref 8.4–10.5)
CHLORIDE: 103 meq/L (ref 96–112)
CO2: 24 mEq/L (ref 19–32)
CO2: 25 meq/L (ref 19–32)
CREATININE: 0.99 mg/dL (ref 0.50–1.35)
Chloride: 102 mEq/L (ref 96–112)
Creatinine, Ser: 1.02 mg/dL (ref 0.50–1.35)
GFR calc Af Amer: >90 mL/min (ref >90)
GFR calc Af Amer: 89 mL/min — ABNORMAL LOW (ref >90)
GFR calc non Af Amer: 86 mL/min — ABNORMAL LOW (ref >90)
GFR calc non Af Amer: 77 mL/min — ABNORMAL LOW (ref >90)
GLUCOSE: 136 mg/dL — AB (ref 70–99)
GLUCOSE: 105 mg/dL — AB (ref 70–99)
Potassium: 4.4 mEq/L (ref 3.7–5.3)
Potassium: 4.3 mEq/L (ref 3.7–5.3)
SODIUM: 139 meq/L (ref 137–147)
Sodium: 142 mEq/L (ref 137–147)

## 2013-11-04 LAB — POCT I-STAT TROPONIN I
TROPONIN I, POC: 0.01 ng/mL (ref 0.00–0.08)
TROPONIN I, POC: 0.02 ng/mL (ref 0.00–0.08)

## 2013-11-04 LAB — CBC
HCT: 43.1 % (ref 39.0–52.0)
Hemoglobin: 14.8 g/dL (ref 13.0–17.0)
MCH: 31.8 pg (ref 26.0–34.0)
MCHC: 34.3 g/dL (ref 30.0–36.0)
MCV: 92.7 fL (ref 78.0–100.0)
PLATELETS: 232 10*3/uL (ref 150–400)
RBC: 4.65 MIL/uL (ref 4.22–5.81)
RDW: 14.3 % (ref 11.5–15.5)
WBC: 8.3 10*3/uL (ref 4.0–10.5)

## 2013-11-04 LAB — PROTIME-INR
INR: 1 (ref 0.00–1.49)
Prothrombin Time: 13 seconds (ref 11.6–15.2)

## 2013-11-04 LAB — PRO B NATRIURETIC PEPTIDE: Pro B Natriuretic peptide (BNP): 572.1 pg/mL — ABNORMAL HIGH (ref 0–450)

## 2013-11-04 LAB — APTT: APTT: 36 s (ref 24–37)

## 2013-11-04 MED ORDER — SODIUM CHLORIDE 0.9 % IJ SOLN: 3.0000 mL | INTRAMUSCULAR | Status: DC | PRN

## 2013-11-04 MED ORDER — ACETAMINOPHEN 325 MG PO TABS
650.0000 mg | ORAL_TABLET | ORAL | Status: DC | PRN
  Administered 2013-11-05: 650 mg via ORAL
  Filled 2013-11-04: qty 2

## 2013-11-04 MED ORDER — NITROGLYCERIN 0.4 MG SL SUBL: 0.4000 mg | SUBLINGUAL_TABLET | SUBLINGUAL | Status: DC | PRN

## 2013-11-04 MED ORDER — DIAZEPAM 5 MG PO TABS
5.0000 mg | ORAL_TABLET | ORAL | Status: DC
  Filled 2013-11-04: qty 1

## 2013-11-04 MED ORDER — DILTIAZEM HCL 100 MG IV SOLR
5.0000 mg/h | INTRAVENOUS | Status: DC
  Filled 2013-11-04 (×2): qty 100

## 2013-11-04 MED ORDER — ONDANSETRON HCL 4 MG/2ML IJ SOLN: 4.0000 mg | Freq: Four times a day (QID) | INTRAMUSCULAR | Status: DC | PRN

## 2013-11-04 MED ORDER — CYANOCOBALAMIN 1000 MCG/ML IJ SOLN
1000.0000 ug | Freq: Once | INTRAMUSCULAR | Status: AC
  Administered 2013-11-04: 1000 ug via INTRAMUSCULAR

## 2013-11-04 MED ORDER — DEXTROSE 5 % IV SOLN
5.0000 mg/h | INTRAVENOUS | Status: DC
  Administered 2013-11-04: 10 mg/h via INTRAVENOUS

## 2013-11-04 MED ORDER — HEPARIN (PORCINE) IN NACL 100-0.45 UNIT/ML-% IJ SOLN
1250.0000 [IU]/h | INTRAMUSCULAR | Status: DC
  Administered 2013-11-05 (×2): 1250 [IU]/h via INTRAVENOUS
  Filled 2013-11-04 (×3): qty 250

## 2013-11-04 MED ORDER — SODIUM CHLORIDE 0.9 % IV SOLN: 250.0000 mL | INTRAVENOUS | Status: DC | PRN

## 2013-11-04 MED ORDER — SODIUM CHLORIDE 0.9 % IJ SOLN: 3.0000 mL | Freq: Two times a day (BID) | INTRAMUSCULAR | Status: DC

## 2013-11-04 MED ORDER — ALPRAZOLAM 0.25 MG PO TABS: 0.2500 mg | ORAL_TABLET | Freq: Two times a day (BID) | ORAL | Status: DC | PRN

## 2013-11-04 MED ORDER — DILTIAZEM LOAD VIA INFUSION
20.0000 mg | Freq: Once | INTRAVENOUS | Status: DC
  Administered 2013-11-04: 20 mg via INTRAVENOUS
  Filled 2013-11-04: qty 20

## 2013-11-04 MED ORDER — ZOLPIDEM TARTRATE 5 MG PO TABS: 5.0000 mg | ORAL_TABLET | Freq: Every evening | ORAL | Status: DC | PRN

## 2013-11-04 MED ORDER — ASPIRIN 81 MG PO CHEW
324.0000 mg | CHEWABLE_TABLET | ORAL | Status: AC
Start: 2013-11-05 — End: 2013-11-05
  Administered 2013-11-05: 324 mg via ORAL
  Filled 2013-11-04: qty 4

## 2013-11-04 MED ORDER — DILTIAZEM HCL 100 MG IV SOLR
5.0000 mg/h | INTRAVENOUS | Status: DC
  Administered 2013-11-04: 10 mg/h via INTRAVENOUS

## 2013-11-04 MED ORDER — DILTIAZEM LOAD VIA INFUSION
20.0000 mg | Freq: Once | INTRAVENOUS | Status: AC
Start: 2013-11-04 — End: 2013-11-04
  Administered 2013-11-04: 20 mg via INTRAVENOUS

## 2013-11-04 MED ORDER — SODIUM CHLORIDE 0.9 % IJ SOLN
3.0000 mL | Freq: Two times a day (BID) | INTRAMUSCULAR | Status: DC
  Administered 2013-11-05: 3 mL via INTRAVENOUS

## 2013-11-04 MED ORDER — SODIUM CHLORIDE 0.9 % IV SOLN
INTRAVENOUS | Status: DC
Start: 2013-11-04 — End: 2013-11-05
  Administered 2013-11-05: 08:00:00 via INTRAVENOUS
  Administered 2013-11-05: 75 mL/h via INTRAVENOUS

## 2013-11-04 MED ORDER — HEPARIN BOLUS VIA INFUSION
4000.0000 [IU] | Freq: Once | INTRAVENOUS | Status: AC
  Administered 2013-11-05: 4000 [IU] via INTRAVENOUS
  Filled 2013-11-04: qty 4000

## 2013-11-04 MED ORDER — PREDNISOLONE ACETATE 1 % OP SUSP
1.0000 [drp] | Freq: Four times a day (QID) | OPHTHALMIC | Status: DC
  Administered 2013-11-05 (×4): 1 [drp] via OPHTHALMIC
  Filled 2013-11-04 (×2): qty 1

## 2013-11-04 MED ORDER — SODIUM CHLORIDE 0.9 % IV SOLN
250.0000 mL | INTRAVENOUS | Status: DC | PRN
Start: 2013-11-04 — End: 2013-11-05

## 2013-11-04 NOTE — Progress Notes (Signed)
ANTICOAGULATION CONSULT NOTE - Initial Consult  Pharmacy Consult for heparin Indication: chest pain/ACS and Aflutter w/ RVR  Allergies  Allergen Reactions  . Codeine Nausea And Vomiting    Patient Measurements: Height: _0  (167.6 cm) Weight: 197 lb 4.8 oz (89.495 kg) IBW/kg (Calculated) : 63.8 Heparin Dosing Weight: 85kg  Vital Signs: Temp: 97.4 F (36.3 C) (01/06 2246) Temp src: Oral (01/06 2246) BP: 117/71 mmHg (01/06 2246) Pulse Rate: 82 (01/06 2246)  Labs:  Recent Labs  11/04/13 1710 11/04/13 1932  HGB 14.8 14.0  HCT 43.1 41.0  PLT 232 205  CREATININE 1.02 0.99    Estimated Creatinine Clearance: 65.5 ml/min (by C-G formula based on Cr of 0.99).   Medical History: Past Medical History  Diagnosis Date  . Colon, diverticulosis   . PVD (peripheral vascular disease)   . Benign prostatic hypertrophy   . Osteoarthritis   . Hypertension   . Hyperlipidemia     Medications:  Prescriptions prior to admission  Medication Sig Dispense Refill  . cyanocobalamin (,VITAMIN B-12,) 1000 MCG/ML injection Inject 1,000 mcg into the muscle every 30 (thirty) days.       . hydrochlorothiazide (HYDRODIURIL) 25 MG tablet Take 25 mg by mouth daily.      . naproxen sodium (ANAPROX) 220 MG tablet Take 440 mg by mouth daily as needed (pain).      . prednisoLONE sodium phosphate (INFLAMASE FORTE) 1 % ophthalmic solution Place 1 drop into the right eye 4 (four) times daily.       Scheduled:  . [START ON 11/05/2013] aspirin  324 mg Oral Pre-Cath  . diazepam  5 mg Oral On Call  . prednisoLONE acetate  1 drop Right Eye QID  . sodium chloride  3 mL Intravenous Q12H  . sodium chloride  3 mL Intravenous Q12H   Infusions:  . sodium chloride    . diltiazem (CARDIZEM) infusion      Assessment: 80yo male had OV w/ cards today c/o SOB, found to be in Aflutter w/ HR to 140s, also w/ USAP given risk factors for CAD, to begin heparin.  Goal of Therapy:  Heparin level 0.3-0.7  units/ml Monitor platelets by anticoagulation protocol: Yes   Plan:  Will give heparin 4000 units IV bolus x1 followed by gtt at 1250 units/hr and monitor heparin levels and CBC.  Wynona Neat, PharmD, BCPS  11/04/2013,10:53 PM

## 2013-11-04 NOTE — ED Provider Notes (Signed)
Please see charting for MRN [id number] as he was initially placed in system with wrong MRN  Sharyon Cable, MD 11/04/13 2034

## 2013-11-04 NOTE — ED Notes (Signed)
Family member to nurse station states she is going home and will return in an hour.

## 2013-11-04 NOTE — ED Provider Notes (Signed)
CSN: [id number]     Arrival date & time 11/04/13  1656 History   First MD Initiated Contact with Patient 11/04/13 1915     Chief Complaint  Patient presents with  . Irregular Heart Beat   HPI  80 y/o male with history of DM, HTN, HLD who presents with cc of shortness of breath. The patient states that for the last several months he has had shortness of breath with exertion. He states at times during these episodes he has a "burning" sensation in his chest. Today he went to his pcp for the above symptoms and was noted to have a "fast" heart rate. He was then sent to a cardiologist office who upon seeing his heart rate sent him here. He denies any current chest pain or shortness of breath. He is a former smoker and former drinker. He denies any drug use.   Past Medical History  Diagnosis Date  . Colon, diverticulosis   . PVD (peripheral vascular disease)   . Benign prostatic hypertrophy   . Osteoarthritis   . Hypertension   . Hyperlipidemia    Past Surgical History  Procedure Laterality Date  . Diverticulitis/partial colectomy  1997    [facility]  . Appendix before    . Left ih    . Tonsillectomy and adenoidectomy      child  . Right ankle fx    . Egd/colon- gastic polyp  12/00  . Stress cardiolite negative  9/02    EF 60%  . Cholecystectomy  12/03  . Egd/colon, gastritis, 2 gastic polyps  11/07  . Ventral hernia repair  11/07    [facility]   Family History  Problem Relation Age of Onset  . Coronary artery disease Sister   . Heart failure Mother    History  Substance Use Topics  . Smoking status: Former Smoker -- 30 years    Types: Cigarettes  . Smokeless tobacco: Former Systems developer  . Alcohol Use: No     Comment: quit driking in 1980's--??alcoholic    Review of Systems  Constitutional: Negative for fever and chills.  Respiratory: Positive for shortness of breath.   Cardiovascular: Positive for chest pain and palpitations.  Gastrointestinal: Negative for nausea and vomiting.   Genitourinary: Negative for dysuria and frequency.  All other systems reviewed and are negative.   Allergies  Codeine  Home Medications   No current outpatient prescriptions on file. BP 110/76  Pulse 67  Temp(Src) 97.4 F (36.3 C) (Oral)  Resp 14  SpO2 96% Physical Exam  Constitutional: He is oriented to person, place, and time. He appears well-developed and well-nourished. No distress.  HENT:  Head: Normocephalic and atraumatic.  Mouth/Throat: No oropharyngeal exudate.  Eyes: Conjunctivae are normal. Pupils are equal, round, and reactive to light.  Neck: Normal range of motion. Neck supple.  Cardiovascular: Normal heart sounds.  Tachycardia present.  Exam reveals no gallop and no friction rub.   No murmur heard. Pulmonary/Chest: Effort normal and breath sounds normal.  Abdominal: Soft. He exhibits no distension. There is no tenderness.  Musculoskeletal: Normal range of motion. He exhibits no edema and no tenderness.  Neurological: He is alert and oriented to person, place, and time. He has normal strength and normal reflexes. No cranial nerve deficit or sensory deficit. Coordination normal. GCS eye subscore is 4. GCS verbal subscore is 5. GCS motor subscore is 6.  Skin: Skin is warm and dry.   ED Course  Procedures (including critical care time) Labs Review Labs  Reviewed  PRO B NATRIURETIC PEPTIDE - Abnormal; Notable for the following:    Pro B Natriuretic peptide (BNP) 572.1 (*)    All other components within normal limits  BASIC METABOLIC PANEL - Abnormal; Notable for the following:    Glucose, Bld 105 (*)    GFR calc non Af Amer 77 (*)    GFR calc Af Amer 89 (*)    All other components within normal limits  CBC WITH DIFFERENTIAL - Abnormal; Notable for the following:    Eosinophils Relative 6 (*)    All other components within normal limits  POCT I-STAT TROPONIN I   Imaging Review Dg Chest 2 View  11/04/2013   CLINICAL DATA:  Irregular heartbeat and shortness of  breath.  EXAM: CHEST - 2 VIEW  COMPARISON:  None  FINDINGS: Lungs show diffuse interstitial prominence as well as fibrotic changes. This is most likely consistent with pulmonary fibrosis/UIP. There may be a component of mild interstitial edema as well. Correlation suggested with any known history of chronic lung disease. No overt airspace edema or pleural fluid identified.  The heart is mildly enlarged. The aorta is mildly ectatic. Degenerative changes are present of the thoracic spine.  IMPRESSION: By chest x-ray, interstitial prominence and fibrotic changes are felt to most likely represent chronic pulmonary fibrosis/UIP. There may also be a component of mild interstitial edema. Prior chest x-rays would be helpful eventually for comparison.   Electronically Signed   By: Aletta Edouard M.D.   On: 11/04/2013 20:18    EKG Interpretation    Date/Time:    Ventricular Rate:    PR Interval:    QRS Duration:   QT Interval:    QTC Calculation:   R Axis:     Text Interpretation:             MDM   80 y/o male with history of DM, HTN, HLD who presents with cc of shortness of breath. Initial EKG c/w atrial flutter with likely 2:1 conduction.  HDS. Otherwise well appearing. Labs unremarkable. Troponin negative. CXR without evidence of pneumonia, dissection or pneumothorax. Doubt PE. Diltiazem given in bolus followed by gtt. Converted to NSR and remained rate controlled. The patient was admitted to cardiology for continued workup and management.   1. Atrial flutter     Donita Brooks, MD 11/04/13 2233

## 2013-11-04 NOTE — ED Provider Notes (Signed)
I have personally seen and examined the patient.  I have discussed the plan of care with the resident.  I have reviewed the documentation on PMH/FH/Soc. History.  I have reviewed the documentation of the resident and agree.  Pt stable in the Ed, I spoke to dr Fletcher Anon about this patient and already planned for admission BP 117/71  Pulse 82  Temp(Src) 97.4 F (36.3 C) (Oral)  Resp 20  Ht 5' 6" (1.676 m)  Wt 197 lb 4.8 oz (89.495 kg)  BMI 31.86 kg/m2  SpO2 97%   Sharyon Cable, MD 11/04/13 2316

## 2013-11-04 NOTE — ED Notes (Signed)
Dr. Jamse Arn at the bedside.

## 2013-11-04 NOTE — Progress Notes (Addendum)
Spoke with pt and wife in room. He is resting comfortably. Occasional right arm discomfort, but had B12 shot today and has IV plus has had blood draws on that side. No decrease in pulse or sensation in that arm. Continue current treatment plan. Continue to follow.

## 2013-11-04 NOTE — Assessment & Plan Note (Signed)
This is a new diagnosis for this patient. Most recent EKG in June showed normal sinus rhythm. He is currently tachycardic with a heart rate of 145 beats per minute. Given his symptoms of chest pain and shortness of breath, I recommend hospital admission for rate control. I am sending him to the emergency room at [facility] to be admitted. Start anticoagulation with heparin and rate control with diltiazem drip. Obtain an echocardiogram.

## 2013-11-04 NOTE — ED Notes (Signed)
Pt came in from [place] at 1656 for irregular heart rate. Pt also reports SOB for extended amount of time, which he was seen at his PCP today and sent here for HR 147. Denies any CP

## 2013-11-04 NOTE — Patient Instructions (Addendum)
Please go straight to [facility] ED   Report called to [facility] Triage RN

## 2013-11-04 NOTE — Assessment & Plan Note (Signed)
This could be due to atrial flutter with rapid ventricular response. However, he has multiple risk factors for coronary artery disease. Check labs and cardiac enzymes. Recommend cardiac catheterization for evaluation. This can likely be done tomorrow once his ventricular rate is controlled.

## 2013-11-04 NOTE — Patient Instructions (Addendum)
Please set up appt for B12 monthly. Please have a reminder sent to pt prior to the appt. if we are able. Stop at front desk for referral to cardiology ASAP If severe chest  pain and SOB... Go to ER. Start baby aspirin daily.

## 2013-11-04 NOTE — ED Provider Notes (Signed)
Date: 11/04/2013  Rate: 148  Rhythm: indeterminate suspect aflutter  QRS Axis: left  Intervals: normal  ST/T Wave abnormalities: nonspecific ST changes  Conduction Disutrbances:none  Narrative Interpretation:   Old EKG Reviewed: none available at time of interpretation    Sharyon Cable, MD 11/04/13 2033

## 2013-11-04 NOTE — Progress Notes (Signed)
Pre-visit discussion using our clinic review tool. No additional management support is needed unless otherwise documented below in the visit note.

## 2013-11-04 NOTE — Progress Notes (Signed)
Primary care physician: Dr. Diona Browner  HPI  This is a pleasant 80 year old man who was referred for evaluation of chest pain and abnormal EKG. The patient is not aware of any previous cardiac history. He has known history of type 2 diabetes not on medications, previous tobacco use, hypertension and hyperlipidemia. He has been having progressive symptoms of exertional chest tightness and shortness of breath which was most noticeable during the summertime. This however has been progressive since then and the episodes have been lasting longer for about 15 minutes. He describes burning sensation substernally with no radiation. This is mainly exertional. He denies orthopnea or PND. He is tachycardic but does not have any palpitations. There is no family history of coronary artery disease. There is family history of aortic aneurysm.  No Known Allergies   Current Outpatient Prescriptions on File Prior to Visit  Medication Sig Dispense Refill  . cyanocobalamin (,VITAMIN B-12,) 1000 MCG/ML injection Inject 1,000 mcg into the muscle every 30 (thirty) days.       . hydrochlorothiazide (HYDRODIURIL) 25 MG tablet TAKE ONE TABLET BY MOUTH EVERY DAY  90 tablet  1  . prednisoLONE sodium phosphate (INFLAMASE FORTE) 1 % ophthalmic solution Place 1 drop into the right eye 4 (four) times daily.       No current facility-administered medications on file prior to visit.     Past Medical History  Diagnosis Date  . Colon, diverticulosis   . PVD (peripheral vascular disease)   . Benign prostatic hypertrophy   . Osteoarthritis   . Hypertension   . Hyperlipidemia      Past Surgical History  Procedure Laterality Date  . Diverticulitis/partial colectomy  1997    [facility]  . Appendix before    . Left ih    . Tonsillectomy and adenoidectomy      child  . Right ankle fx    . Egd/colon- gastic polyp  12/00  . Stress cardiolite negative  9/02    EF 60%  . Cholecystectomy  12/03  . Egd/colon, gastritis, 2  gastic polyps  11/07  . Ventral hernia repair  11/07    [facility]nett     Family History  Problem Relation Age of Onset  . Coronary artery disease Sister   . Heart failure Mother      History   Social History  . Marital Status: Married    Spouse Name: N/A    Number of Children: 3  . Years of Education: N/A   Occupational History  .     Social History Main Topics  . Smoking status: Former Smoker -- 30 years    Types: Cigarettes  . Smokeless tobacco: Former Systems developer  . Alcohol Use: No     Comment: quit driking in 1980's--??alcoholic   . Drug Use: No  . Sexual Activity: Not on file   Other Topics Concern  . Not on file   Social History Narrative   Still does lawn work.    No living will, Wife is HCPOA.     ROS A 10 point review of system was performed. It is negative other than that mentioned in the history of present illness.   PHYSICAL EXAM   BP 136/101  Pulse 145  Ht _0  (1.676 m)  Wt 201 lb (91.173 kg)  BMI 32.46 kg/m2 Constitutional: He is oriented to person, place, and time. He appears well-developed and well-nourished. No distress.  HENT: No nasal discharge.  Head: Normocephalic and atraumatic.  Eyes: Pupils are equal  and round.  No discharge. Neck: Normal range of motion. Neck supple. No JVD present. No thyromegaly present.  Cardiovascular: Normal rate, regular rhythm, normal heart sounds. Exam reveals no gallop and no friction rub. No murmur heard.  Pulmonary/Chest: Effort normal and breath sounds normal. No stridor. No respiratory distress. He has no wheezes. He has no rales. He exhibits no tenderness.  Abdominal: Soft. Bowel sounds are normal. He exhibits no distension. There is no tenderness. There is no rebound and no guarding.  Musculoskeletal: Normal range of motion. He exhibits no edema and no tenderness.  Neurological: He is alert and oriented to person, place, and time. Coordination normal.  Skin: Skin is warm and dry. No rash noted. He is not  diaphoretic. No erythema. No pallor.  Psychiatric: He has a normal mood and affect. His behavior is normal. Judgment and thought content normal.       EKG: Atrial flutter with 2-1 AV block. Right bundle branch block. Left ventricular hypertrophy.   ASSESSMENT AND PLAN

## 2013-11-04 NOTE — Progress Notes (Signed)
   Subjective:    Patient ID: Caleb Bryant, male    DOB: Jan 02, 1934, 80 y.o.   MRN: [id number]  HPI 80 year old male with history of HTN, DM and high cholesterol presents with  shortness of breath with exertion off and on.  He has had several episodes of shortness of breath and a chest "burn/hurt" in the last year with strenuous activity.  Lasts 5-10 mins. Better when he rests.  Has NOT increased in frequency.  Last episode was in last 2 weeks. Occ SOB at rest, has to take deep breaths.  No cough, no fever.   BP has been well controlled on HTCZ.  Not checking at home.  Has not had B12 injection in a while.   Former remote smoker 30 years ago... 3 packs per day x 3-4 years.   Review of Systems  Constitutional: Negative for fever, fatigue and unexpected weight change.  HENT: Negative for congestion, ear pain, postnasal drip, rhinorrhea, sore throat and trouble swallowing.   Eyes: Negative for pain.  Respiratory: Positive for shortness of breath. Negative for cough and wheezing.   Cardiovascular: Positive for chest pain. Negative for palpitations and leg swelling.  Gastrointestinal: Negative for nausea, abdominal pain, diarrhea, constipation and blood in stool.  Genitourinary: Negative for dysuria, urgency, hematuria, discharge, penile swelling, scrotal swelling, difficulty urinating, penile pain and testicular pain.  Skin: Negative for rash.  Neurological: Negative for syncope, weakness, light-headedness, numbness and headaches.  Psychiatric/Behavioral: Negative for behavioral problems and dysphoric mood. The patient is not nervous/anxious.        Objective:   Physical Exam  Constitutional: Vital signs are normal. He appears well-developed and well-nourished.  HENT:  Head: Normocephalic.  Right Ear: Hearing normal.  Left Ear: Hearing normal.  Nose: Nose normal.  Mouth/Throat: Oropharynx is clear and moist and mucous membranes are normal.  Neck: Trachea normal. Carotid  bruit is not present. No mass and no thyromegaly present.  Cardiovascular: Regular rhythm, normal heart sounds and normal pulses.  Tachycardia present.  Exam reveals no gallop, no distant heart sounds and no friction rub.   No peripheral edema  Pulmonary/Chest: Effort normal and breath sounds normal. No respiratory distress.  Skin: Skin is warm, dry and intact. No rash noted.  Psychiatric: He has a normal mood and affect. His speech is normal and behavior is normal. Thought content normal.          Assessment & Plan:  Pt high risk for CAD given risk factors of age, DM, HTN, high cholesterol. He has poor lifestyle ( diet no specific exercsie) except that he is very active.  Given symptoms and abn EKG I will refer [facility] to cardiology for consideration of stress testing vs catheterization.  no current chest pain.

## 2013-11-04 NOTE — ED Notes (Signed)
Pt reports sob for extended amount of time, went to pcp today and sent here due to HR 147. Denies any cp. Airway intact, resp e/u.

## 2013-11-05 ENCOUNTER — Ambulatory Visit (HOSPITAL_COMMUNITY): Admission: RE | Admit: 2013-11-05 | Payer: Medicare Other | Source: Ambulatory Visit | Admitting: Cardiovascular Disease

## 2013-11-05 ENCOUNTER — Encounter (HOSPITAL_COMMUNITY)
Admission: EM | Disposition: A | Discharge status: Home / Self Care | Payer: Self-pay | Source: Home / Self Care | Attending: Cardiovascular Disease

## 2013-11-05 ENCOUNTER — Encounter (HOSPITAL_COMMUNITY): Payer: Self-pay | Admitting: General Practice

## 2013-11-05 ENCOUNTER — Encounter: Payer: Self-pay | Admitting: *Deleted

## 2013-11-05 DIAGNOSIS — I4892 Unspecified atrial flutter: Principal | ICD-10-CM

## 2013-11-05 DIAGNOSIS — I2510 Atherosclerotic heart disease of native coronary artery without angina pectoris: Secondary | ICD-10-CM | POA: Diagnosis not present

## 2013-11-05 HISTORY — PX: CORONARY ANGIOGRAM: SHX5466

## 2013-11-05 LAB — PRO B NATRIURETIC PEPTIDE: PRO B NATRI PEPTIDE: 566.4 pg/mL — AB (ref 0–450)

## 2013-11-05 LAB — COMPREHENSIVE METABOLIC PANEL
ALK PHOS: 59 U/L (ref 39–117)
ALT: 14 U/L (ref 0–53)
AST: 21 U/L (ref 0–37)
Albumin: 3.4 g/dL — ABNORMAL LOW (ref 3.5–5.2)
BILIRUBIN TOTAL: 0.5 mg/dL (ref 0.3–1.2)
BUN: 19 mg/dL (ref 6–23)
CHLORIDE: 106 meq/L (ref 96–112)
CO2: 26 meq/L (ref 19–32)
Calcium: 8.6 mg/dL (ref 8.4–10.5)
Creatinine, Ser: 1.1 mg/dL (ref 0.50–1.35)
GFR calc non Af Amer: 63 mL/min — ABNORMAL LOW (ref >90)
GFR, EST AFRICAN AMERICAN: 73 mL/min — AB (ref >90)
GLUCOSE: 117 mg/dL — AB (ref 70–99)
POTASSIUM: 4.4 meq/L (ref 3.7–5.3)
SODIUM: 145 meq/L (ref 137–147)
Total Protein: 6.6 g/dL (ref 6.0–8.3)

## 2013-11-05 LAB — HEPARIN LEVEL (UNFRACTIONATED): Heparin Unfractionated: 0.37 IU/mL (ref 0.30–0.70)

## 2013-11-05 LAB — LIPID PANEL
Cholesterol: 127 mg/dL (ref 0–200)
HDL: 39 mg/dL — AB (ref >39)
LDL CALC: 75 mg/dL (ref 0–99)
TRIGLYCERIDES: 67 mg/dL (ref <150)
Total CHOL/HDL Ratio: 3.3 RATIO
VLDL: 13 mg/dL (ref 0–40)

## 2013-11-05 LAB — TROPONIN I: Troponin I: <0.3 ng/mL (ref <0.30)

## 2013-11-05 LAB — TSH: TSH: 2.603 u[IU]/mL (ref 0.350–4.500)

## 2013-11-05 SURGERY — CORONARY ANGIOGRAM

## 2013-11-05 MED ORDER — DIAZEPAM 5 MG PO TABS: 5.0000 mg | ORAL_TABLET | ORAL | Status: DC

## 2013-11-05 MED ORDER — NITROGLYCERIN 0.2 MG/ML ON CALL CATH LAB
INTRAVENOUS | Status: AC
  Filled 2013-11-05: qty 1

## 2013-11-05 MED ORDER — MIDAZOLAM HCL 2 MG/2ML IJ SOLN
INTRAMUSCULAR | Status: AC
  Filled 2013-11-05: qty 2

## 2013-11-05 MED ORDER — METOPROLOL TARTRATE 25 MG PO TABS
25.0000 mg | ORAL_TABLET | Freq: Two times a day (BID) | ORAL | Status: DC
  Administered 2013-11-05 – 2013-11-06 (×3): 25 mg via ORAL
  Filled 2013-11-05 (×5): qty 1

## 2013-11-05 MED ORDER — SODIUM CHLORIDE 0.9 % IV SOLN
INTRAVENOUS | Status: AC
  Administered 2013-11-05: 12:00:00 via INTRAVENOUS

## 2013-11-05 MED ORDER — VERAPAMIL HCL 2.5 MG/ML IV SOLN
INTRAVENOUS | Status: AC
  Filled 2013-11-05: qty 2

## 2013-11-05 MED ORDER — ONDANSETRON HCL 4 MG/2ML IJ SOLN
INTRAMUSCULAR | Status: AC
  Filled 2013-11-05: qty 2

## 2013-11-05 MED ORDER — HEPARIN (PORCINE) IN NACL 100-0.45 UNIT/ML-% IJ SOLN
1250.0000 [IU]/h | INTRAMUSCULAR | Status: DC
  Filled 2013-11-05: qty 250

## 2013-11-05 MED ORDER — FENTANYL CITRATE 0.05 MG/ML IJ SOLN
INTRAMUSCULAR | Status: AC
  Filled 2013-11-05: qty 2

## 2013-11-05 MED ORDER — HEPARIN (PORCINE) IN NACL 2-0.9 UNIT/ML-% IJ SOLN
INTRAMUSCULAR | Status: AC
  Filled 2013-11-05: qty 1000

## 2013-11-05 MED ORDER — LIDOCAINE HCL (PF) 1 % IJ SOLN
INTRAMUSCULAR | Status: AC
  Filled 2013-11-05: qty 30

## 2013-11-05 MED ORDER — HEPARIN SODIUM (PORCINE) 1000 UNIT/ML IJ SOLN
INTRAMUSCULAR | Status: AC
  Filled 2013-11-05: qty 1

## 2013-11-05 MED ORDER — APIXABAN 5 MG PO TABS
5.0000 mg | ORAL_TABLET | Freq: Two times a day (BID) | ORAL | Status: DC
  Administered 2013-11-05 – 2013-11-06 (×2): 5 mg via ORAL
  Filled 2013-11-05 (×3): qty 1

## 2013-11-05 NOTE — H&P (Signed)
Primary care physician: Dr. Diona Browner   HPI   This is a pleasant 80 year old man who was referred for evaluation of chest pain and abnormal EKG. The patient is not aware of any previous cardiac history. He has known history of type 2 diabetes not on medications, previous tobacco use, hypertension and hyperlipidemia. He has been having progressive symptoms of exertional chest tightness and shortness of breath which was most noticeable during the summertime. This however has been progressive since then and the episodes have been lasting longer for about 15 minutes. He describes burning sensation substernally with no radiation. This is mainly exertional. He denies orthopnea or PND. He is tachycardic but does not have any palpitations. There is no family history of coronary artery disease. There is family history of aortic aneurysm.   No Known Allergies      Current Outpatient Prescriptions on File Prior to Visit   Medication  Sig  Dispense  Refill   .  cyanocobalamin (,VITAMIN B-12,) 1000 MCG/ML injection  Inject 1,000 mcg into the muscle every 30 (thirty) days.          .  hydrochlorothiazide (HYDRODIURIL) 25 MG tablet  TAKE ONE TABLET BY MOUTH EVERY DAY   90 tablet   1   .  prednisoLONE sodium phosphate (INFLAMASE FORTE) 1 % ophthalmic solution  Place 1 drop into the right eye 4 (four) times daily.             No current facility-administered medications on file prior to visit.           Past Medical History   Diagnosis  Date   .  Colon, diverticulosis     .  PVD (peripheral vascular disease)     .  Benign prostatic hypertrophy     .  Osteoarthritis     .  Hypertension     .  Hyperlipidemia             Past Surgical History   Procedure  Laterality  Date   .  Diverticulitis/partial colectomy    1997       [facility]   .  Appendix before       .  Left ih       .  Tonsillectomy and adenoidectomy           child   .  Right ankle fx       .  Egd/colon- gastic polyp    12/00   .   Stress cardiolite negative    9/02       EF 60%   .  Cholecystectomy    12/03   .  Egd/colon, gastritis, 2 gastic polyps    11/07   .  Ventral hernia repair    11/07       [facility]nett           Family History   Problem  Relation  Age of Onset   .  Coronary artery disease  Sister     .  Heart failure  Mother             History       Social History   .  Marital Status:  Married       Spouse Name:  N/A       Number of Children:  3   .  Years of Education:  N/A       Occupational History   .  Social History Main Topics   .  Smoking status:  Former Smoker -- 30 years       Types:  Cigarettes   .  Smokeless tobacco:  Former Systems developer   .  Alcohol Use:  No         Comment: quit driking in 1980's--??alcoholic    .  Drug Use:  No   .  Sexual Activity:  Not on file       Other Topics  Concern   .  Not on file       Social History Narrative     Still does lawn work.      No living will, Wife is HCPOA.          ROS A 10 point review of system was performed. It is negative other than that mentioned in the history of present illness.     PHYSICAL EXAM     BP 136/101  Pulse 145  Ht _0  (1.676 m)  Wt 201 lb (91.173 kg)  BMI 32.46 kg/m2 Constitutional: He is oriented to person, place, and time. He appears well-developed and well-nourished. No distress.   HENT: No nasal discharge.   Head: Normocephalic and atraumatic.   Eyes: Pupils are equal and round.  No discharge. Neck: Normal range of motion. Neck supple. No JVD present. No thyromegaly present.   Cardiovascular: Normal rate, regular rhythm, normal heart sounds. Exam reveals no gallop and no friction rub. No murmur heard.   Pulmonary/Chest: Effort normal and breath sounds normal. No stridor. No respiratory distress. He has no wheezes. He has no rales. He exhibits no tenderness.   Abdominal: Soft. Bowel sounds are normal. He exhibits no distension. There is no tenderness. There is no rebound and  no guarding.  Musculoskeletal: Normal range of motion. He exhibits no edema and no tenderness.  Neurological: He is alert and oriented to person, place, and time. Coordination normal.   Skin: Skin is warm and dry. No rash noted. He is not diaphoretic. No erythema. No pallor.  Psychiatric: He has a normal mood and affect. His behavior is normal. Judgment and thought content normal.            EKG: Atrial flutter with 2-1 AV block. Right bundle branch block. Left ventricular hypertrophy.     ASSESSMENT AND PLAN              Atrial flutter with rapid ventricular response -    This is a new diagnosis for this patient. Most recent EKG in June showed normal sinus rhythm. He is currently tachycardic with a heart rate of 145 beats per minute. Given his symptoms of chest pain and shortness of breath, I recommend hospital admission for rate control. I am sending him to the emergency room at [facility] to be admitted. Start anticoagulation with heparin and rate control with diltiazem drip. Obtain an echocardiogram.         Unstable angina -    This could be due to atrial flutter with rapid ventricular response. However, he has multiple risk factors for coronary artery disease. Check labs and cardiac enzymes. Recommend cardiac catheterization for evaluation. This can likely be done tomorrow once his ventricular rate is controlled.

## 2013-11-05 NOTE — Progress Notes (Signed)
Pt back from cath lab; pt walked from stretcher to bed; pt requesting to sit on side of bed due to R shoulder pain related to lying flat during cath; R radial benign; VSS; family in room; will cont. To monitor

## 2013-11-05 NOTE — Progress Notes (Signed)
UR Completed.  Caleb Bryant [phone] 11/05/2013

## 2013-11-05 NOTE — Consult Note (Signed)
ELECTROPHYSIOLOGY CONSULT NOTE  Patient ID: Caleb Bryant MRN: [id number], DOB/AGE: 1933/12/11   Admit date: 11/04/2013 Date of Consult: 11/05/2013  Primary Physician: Eliezer Lofts, MD Primary Cardiologist: Fletcher Anon, MD Reason for Consultation: Atrial flutter  History of Present Illness Caleb Bryant is a 80 y.o. male with HTN, dyslipidemia and chest pain who presented yesterday with chest pain. He was found to have rapid atrial flutter. He was admitted and underwent cardiac catheterization today which revealed mild nonobstructive CAD.  He has not had syncope. He has minimal palpitations. He feels better with adequate rate control.  Past Medical History Past Medical History  Diagnosis Date  . Colon, diverticulosis   . PVD (peripheral vascular disease)   . Benign prostatic hypertrophy   . Hypertension   . Hyperlipidemia   . Atrial flutter     "just dx'd today" (11/04/2013)  . Exertional shortness of breath   . Borderline diabetes   . Osteoarthritis     "all over" (11/04/2013)  . Gout   . Anxiety     Past Surgical History Past Surgical History  Procedure Laterality Date  . Partial colectomy  1997    "diverticulitis; busted; [facility]"  . Appendectomy  1997    "days before 1st colon OR"  . Tonsillectomy and adenoidectomy      child  . Ankle fracture surgery Right   . Colonoscopy with esophagogastroduodenoscopy (egd)  12/00    gastic polyp   . Stress cardiolite negative  9/02    EF 60%  . Cholecystectomy  12/03  . Colonoscopy with esophagogastroduodenoscopy (egd)  11/07    gastritis, 2 gastic polyps  . Ventral hernia repair  11/07    [facility]  . Colostomy  1997  . Colostomy reversal      "think I wore it 4 months" (11/04/2013)  . Cataract extraction, bilateral Bilateral   . Corneal transplant Right   . Colon surgery      "diverticulitis"  . Inguinal hernia repair Left   . Hernia repair      Allergies/Intolerances Allergies  Allergen Reactions  . Codeine Nausea And  Vomiting    Current Home Medications      cyanocobalamin 1000 MCG/ML injection  Commonly known as:  (VITAMIN B-12)  Inject 1,000 mcg into the muscle every 30 (thirty) days.     hydrochlorothiazide 25 MG tablet  Commonly known as:  HYDRODIURIL  Take 25 mg by mouth daily.     naproxen sodium 220 MG tablet  Commonly known as:  ANAPROX  Take 440 mg by mouth daily as needed (pain).     prednisoLONE sodium phosphate 1 % ophthalmic solution  Commonly known as:  INFLAMASE FORTE  Place 1 drop into the right eye 4 (four) times daily.     Inpatient Medications . metoprolol tartrate  25 mg Oral BID  . prednisoLONE acetate  1 drop Right Eye QID  . sodium chloride  3 mL Intravenous Q12H   . sodium chloride 100 mL/hr at 11/05/13 1142    Family History Family History  Problem Relation Age of Onset  . Coronary artery disease Sister   . Heart failure Mother      Social History History   Social History  . Marital Status: Married    Spouse Name: N/A    Number of Children: 3  . Years of Education: N/A   Occupational History  .     Social History Main Topics  . Smoking status: Former Smoker -- 3.00 packs/day  for 30 years    Types: Cigarettes  . Smokeless tobacco: Former Systems developer    Types: Chew     Comment: 11/04/2013 "quit smoking in the 1980's or so; quit chewing before I quit smoking"  . Alcohol Use: Yes     Comment: 11/04/2013 "quit driking in 1980's; was called a weekend alcoholic"  . Drug Use: No  . Sexual Activity: No   Other Topics Concern  . Not on file   Social History Narrative   Still does lawn work.    No living will, Wife is HCPOA.     Review of Systems General: No chills, fever, night sweats or weight changes  Cardiovascular:  No chest pain, dyspnea on exertion, edema, orthopnea, palpitations, paroxysmal nocturnal dyspnea Dermatological: No rash, lesions or masses Respiratory: No cough, dyspnea Urologic: No hematuria, dysuria Abdominal: No nausea, vomiting,  diarrhea, bright red blood per rectum, melena, or hematemesis Neurologic: No visual changes, weakness, changes in mental status All other systems reviewed and are otherwise negative except as noted above.  Physical Exam Vitals: Blood pressure 130/73, pulse 92, temperature 97.8 F (36.6 C), temperature source Oral, resp. rate 16, height _0  (1.676 m), weight 197 lb 1.5 oz (89.4 kg), SpO2 97.00%.  General: Well developed, well appearing 80 y.o. male in no acute distress. HEENT: Normocephalic, atraumatic. EOMs intact. Sclera nonicteric. Oropharynx clear.  Neck: Supple without bruits. No JVD. Lungs: Respirations regular and unlabored, CTA bilaterally. No wheezes, rales or rhonchi. Heart: RRR. S1, S2 present. No murmurs, rub, S3 or S4. Abdomen: Soft, non-tender, non-distended. BS present x 4 quadrants. No hepatosplenomegaly.  Extremities: No clubbing, cyanosis or edema. DP/PT/Radials 2+ and equal bilaterally. Psych: Normal affect. Neuro: Alert and oriented X 3. Moves all extremities spontaneously. Musculoskeletal: No kyphosis. Skin: Intact. Warm and dry. No rashes or petechiae in exposed areas.   Labs  Recent Labs  11/04/13 2315 11/05/13 0355  TROPONINI <0.30 <0.30   Lab Results  Component Value Date   WBC 9.8 11/04/2013   HGB 14.7 11/04/2013   HCT 42.5 11/04/2013   MCV 93.0 11/04/2013   PLT 228 11/04/2013    Recent Labs Lab 11/05/13 0355  NA 145  K 4.4  CL 106  CO2 26  BUN 19  CREATININE 1.10  CALCIUM 8.6  PROT 6.6  BILITOT 0.5  ALKPHOS 59  ALT 14  AST 21  GLUCOSE 117*   Lab Results  Component Value Date   CHOL 127 11/05/2013   HDL 39* 11/05/2013   LDLCALC 75 11/05/2013   TRIG 67 11/05/2013    Recent Labs  11/04/13 2315  TSH 2.603    Recent Labs  11/04/13 2315  INR 1.00    Radiology/Studies Dg Chest 2 View 11/04/2013     IMPRESSION: By chest x-ray, interstitial prominence and fibrotic changes are felt to most likely represent chronic pulmonary fibrosis/UIP. There  may also be a component of mild interstitial edema. Prior chest x-rays would be helpful eventually for comparison.    Electronically Signed By: Aletta Edouard M.D. On: 11/04/2013 20:18   Cardiac catheterization Hemodynamics:  AO: 118/82 mmHg  Coronary angiography:  Coronary dominance: Right  Left Main: Normal  Left Anterior Descending (LAD): Normal in size with no significant disease.  1st diagonal (D1): Large in size with no significant disease.  2nd diagonal (D2): Medium in size with no significant disease.  3rd diagonal (D3): Small in size with no significant disease.  Circumflex (LCx): Normal in size and nondominant. The vessel has  no significant disease.  Ramus Intermedius: Large in size with no significant disease.  Right Coronary Artery: Normal in size and dominant. The vessel is mildly calcified. There is 30% ostial stenosis and 30% tubular proximal disease.  Posterior descending artery: Normal in size with no significant disease.  Posterior AV segment: Medium in size with no significant disease.  Posterolateral branchs: No significant disease. Left ventriculography: Was not performed. I could not cross the aortic valve due to tortuosity in the innominate artery.  Final Conclusions:  Mild nonobstructive coronary artery disease   Echocardiogram pending  12-lead ECG on admission shows atrial flutter at 148 bpm Telemetry - atrial flutter with a variable ventricular response from 150 to 75 bpm  Assessment and Plan 1. Atrial flutter 2. Mild nonobstructive CAD   Dr. Lovena Le to see. Please see recommendations below. Signed, Ileene Hutchinson, PA-C 11/05/2013, 12:10 PM  EP attending  Patient seen and examined. Agree with the above history, physical exam, assessment and plan. The patient has a h/o chest pressure and was admitted for heart cath in the setting of atrial flutter with an RVR. He has been started on IV heparin. His rate now appears to be well controlled on medical  therapy. I have discussed the treatment options. I would recommend discharge home tomorrow now that his rate is controlled, followed by 3 weeks of Xarelto or Eliquis followed by elective admit for catheter ablation. As he has not been therapeutically anti-coagulated and cannot be easily transitioned without an overlap of no anti-coagulation, would prefer this option. An alternative would be to give 3 days of Eliquis or Xarelto and represent for TEE guided catheter ablation early next week. He is pondering his options. Expect he will be ready for discharge later today or in the a.m.  Mikle Bosworth.D.

## 2013-11-05 NOTE — Progress Notes (Signed)
TR band removed at this time; dressing placed; site benign; no bleeding noted; pt sitting on side of bed at this time; family in room; will cont. To monitor.

## 2013-11-05 NOTE — Interval H&P Note (Signed)
History and Physical Interval Note:  11/05/2013 10:20 AM  Caleb Bryant  has presented today for surgery, with the diagnosis of [name]  The various methods of treatment have been discussed with the patient and family. After consideration of risks, benefits and other options for treatment, the patient has consented to  Procedure(s): LEFT HEART CATHETERIZATION WITH CORONARY ANGIOGRAM (N/A) as a surgical intervention .  The patient's history has been reviewed, patient examined, no change in status, stable for surgery.  I have reviewed the patient's chart and labs.  Questions were answered to the patient's satisfaction.     [name]

## 2013-11-05 NOTE — Care Management Note (Unsigned)
    Page 1 of 1   11/05/2013     11:50:30 AM   CARE MANAGEMENT NOTE 11/05/2013  Patient:  Caleb Bryant, Caleb Bryant   Account Number:  [id number]  Date Initiated:  11/05/2013  Documentation initiated by:  Loras Grieshop  Subjective/Objective Assessment:   PT ADM ON 11/04/13 WITH CHEST PAIN, AFLUTTER.  PTA, PT INDEPENDENT, LIVES WITH SPOUSE.     Action/Plan:   WILL FOLLOW FOR DC NEEDS AS PT PROGRESSES.   Anticipated DC Date:  11/07/2013   Anticipated DC Plan:  [place]  CM consult      Choice offered to / List presented to:             Status of service:  In process, will continue to follow Medicare Important Message given?   (If response is "NO", the following Medicare IM given date fields will be blank) Date Medicare IM given:   Date Additional Medicare IM given:    Discharge Disposition:    Per UR Regulation:  Reviewed for med. necessity/level of care/duration of stay  If discussed at [place] of Stay Meetings, dates discussed:    Comments:

## 2013-11-05 NOTE — Progress Notes (Signed)
Unable to give Valium on call to cath lab; unavailable from pyxis this AM; cath lab staff made aware.

## 2013-11-05 NOTE — Progress Notes (Signed)
ANTICOAGULATION CONSULT NOTE - Initial Consult  Pharmacy Consult for heparin Indication: Afib  Allergies  Allergen Reactions  . Codeine Nausea And Vomiting    Patient Measurements: Height: _0  (167.6 cm) Weight: 197 lb 1.5 oz (89.4 kg) IBW/kg (Calculated) : 63.8 Heparin Dosing Weight: 85kg  Vital Signs: Temp: 97.8 F (36.6 C) (01/07 1138) Temp src: Oral (01/07 1138) BP: 130/73 mmHg (01/07 1151) Pulse Rate: 92 (01/07 1151)  Labs:  Recent Labs  11/04/13 1710 11/04/13 1932 11/04/13 2315 11/05/13 0355 11/05/13 0715  HGB 14.8 14.0 14.7  --   --   HCT 43.1 41.0 42.5  --   --   PLT 232 205 228  --   --   APTT  --   --  36  --   --   LABPROT  --   --  13.0  --   --   INR  --   --  1.00  --   --   HEPARINUNFRC  --   --   --   --  0.37  CREATININE 1.02 0.99  --  1.10  --   TROPONINI  --   --  <0.30 <0.30  --     Estimated Creatinine Clearance: 58.9 ml/min (by C-G formula based on Cr of 1.1).   Assessment: 80yo male had OV w/ cards today c/o SOB, found to be in Aflutter w/ HR to 140s. S/p cath on 1/7. No CAD. Plan to restart heparin drip 8 hours post sheath removal.   Goal of Therapy:  Heparin level 0.3-0.7 units/ml Monitor platelets by anticoagulation protocol: Yes   Plan:  1) Restart heparin at 1250 units/hr 8 hours post sheath removal 2) F/u 8 hour HL 3) Monitor daily HL, CBC and s/s of bleeding  Albertina Parr, PharmD.  Clinical Pharmacist Pager [phone]

## 2013-11-05 NOTE — CV Procedure (Signed)
Cardiac Catheterization Procedure Note  Name: Caleb Bryant MRN: [id number] DOB: [birth year]   Procedure: Selective Coronary Angiography without crossing the aortic valve.  Indication: Unstable angina and atrial flutter.  Medications:  Sedation:  1 mg IV Versed, 50 mcg IV Fentanyl  Contrast:  107 mL Omnipaque   Procedural Details: The right wrist was prepped, draped, and anesthetized with 1% lidocaine. Using the modified Seldinger technique, a 5 French sheath was introduced into the right radial artery. 3 mg of verapamil was administered through the sheath, weight-based unfractionated heparin was administered intravenously. A Jackie catheter was used for selective coronary angiography. However, I could not engage the coronary arteries with this catheter due to tortuosity in the innominate artery. A JR 4 was used to engage the right coronary artery. Engaging the left coronary artery was very difficult in spite of using multiple diagnostic catheters. Thus, I used a. Ikari 3.5 guiding catheter. Catheter exchanges were performed over an exchange length guidewire. I could not cross the aortic valve with a pigtail catheter due to tortuosity and not calcifications or stenosis. There were no immediate procedural complications. A TR band was used for radial hemostasis at the completion of the procedure.  The patient was transferred to the post catheterization recovery area for further monitoring.  Procedural Findings:  Hemodynamics: AO:  118/82   mmHg  Coronary angiography: Coronary dominance: Right   Left Main:  Normal  Left Anterior Descending (LAD):  Normal in size with no significant disease.  1st diagonal (D1):  Large in size with no significant disease.  2nd diagonal (D2):  Medium in size with no significant disease.  3rd diagonal (D3):  Small in size with no significant disease.  Circumflex (LCx):  Normal in size and nondominant. The vessel has no significant disease.  Ramus  Intermedius:  Large in size with no significant disease.  Right Coronary Artery: Normal in size and dominant. The vessel is mildly calcified. There is 30% ostial stenosis and 30% tubular proximal disease.  Posterior descending artery: Normal in size with no significant disease.  Posterior AV segment: Medium in size with no significant disease.  Posterolateral branchs:  No significant disease.  Left ventriculography: Was not performed. I could not cross the aortic valve due to tortuosity in the innominate artery.  Final Conclusions:    Mild nonobstructive coronary artery disease.  Recommendations:  I will request an echocardiogram to evaluate LV systolic function and atrial size. His symptoms are likely related to atrial flutter. I will request an electrophysiology consult. Options include ablation versus 3 weeks of anticoagulation followed by cardioversion. I started small dose metoprolol for rate control. Heparin will be resumed 8 hours after sheath pull.   [name] MD, [facility] 11/05/2013, 11:20 AM

## 2013-11-05 NOTE — Progress Notes (Addendum)
Pt heart rate decreased down in the 50's aflutter. Decrease cardizem gtt to 61m/hr. Heart rate continue to decrease to 40-50's;stopped cardizem and call md on call per order. Vs stable. Orders given. Monitoring will continue.

## 2013-11-06 ENCOUNTER — Other Ambulatory Visit: Payer: Self-pay | Admitting: Cardiology

## 2013-11-06 ENCOUNTER — Telehealth: Payer: Self-pay

## 2013-11-06 DIAGNOSIS — I4892 Unspecified atrial flutter: Secondary | ICD-10-CM | POA: Diagnosis not present

## 2013-11-06 LAB — CBC
HEMATOCRIT: 39.3 % (ref 39.0–52.0)
Hemoglobin: 13.2 g/dL (ref 13.0–17.0)
MCH: 31.6 pg (ref 26.0–34.0)
MCHC: 33.6 g/dL (ref 30.0–36.0)
MCV: 94 fL (ref 78.0–100.0)
Platelets: 176 10*3/uL (ref 150–400)
RBC: 4.18 MIL/uL — ABNORMAL LOW (ref 4.22–5.81)
RDW: 14.3 % (ref 11.5–15.5)
WBC: 6.7 10*3/uL (ref 4.0–10.5)

## 2013-11-06 LAB — HEPARIN LEVEL (UNFRACTIONATED): Heparin Unfractionated: 0.97 IU/mL — ABNORMAL HIGH (ref 0.30–0.70)

## 2013-11-06 MED ORDER — METOPROLOL TARTRATE 25 MG PO TABS: 25.0000 mg | ORAL_TABLET | Freq: Two times a day (BID) | ORAL | Status: DC

## 2013-11-06 MED ORDER — APIXABAN 5 MG PO TABS: 5.0000 mg | ORAL_TABLET | Freq: Two times a day (BID) | ORAL | Status: DC

## 2013-11-06 NOTE — Progress Notes (Signed)
Reviewed discharge instructions with patient and his wife by teachback, Dc home with belongings , dc instructions. Joylene Draft A

## 2013-11-06 NOTE — Progress Notes (Signed)
CARDIAC REHAB PHASE I   PRE:  Rate/Rhythm: 96 aflutter  BP:  Supine:   Sitting: 110/78  Standing:    SaO2: 93%RA  MODE:  Ambulation: 300 ft   POST:  Rate/Rhythm: 139 aflutter  BP:  Supine:   Sitting: 122/80  Standing:    SaO2: 91%RA 1040-1100 Heart rate up to 139 aflutter during walk. Pt denied palpitations. C/o feeling a little tired. Pt does have SOB at times when heart rate up but no DOE at this time. Pt encouraged to take it easy at home and not over exert self.   Graylon Good, RN BSN  11/06/2013 10:58 AM

## 2013-11-06 NOTE — Discharge Instructions (Signed)
No driving for 3 days. No lifting over 5 lbs for 1 week. Keep procedure site clean & dry. If you notice increased pain, swelling, bleeding or pus, call/return!  You may shower, but no soaking baths/hot tubs/pools for 1 week.

## 2013-11-06 NOTE — Discharge Summary (Signed)
ELECTROPHYSIOLOGY DISCHARGE SUMMARY    Patient ID: Caleb Bryant,  MRN: [id number], DOB/AGE: April 07, 1934 80 y.o.  Admit date: 11/04/2013 Discharge date: 11/06/2013  Primary Care Physician: Eliezer Lofts, MD Primary Cardiologist: Fletcher Anon, MD Primary EP: Lovena Le, MD  Primary Discharge Diagnosis:  1. Atrial flutter 2. Mild, nonobstructive CAD  Secondary Discharge Diagnoses:  1. Hypertension 2. Dyslipidemia 3. DM 4. Osteoarthritis  Procedures This Admission:   1. Cardiac catheterization 11/05/2013 Procedural Findings:  Hemodynamics:  AO: 118/82 mmHg  Coronary angiography:  Coronary dominance: Right  Left Main: Normal  Left Anterior Descending (LAD): Normal in size with no significant disease.  1st diagonal (D1): Large in size with no significant disease.  2nd diagonal (D2): Medium in size with no significant disease.  3rd diagonal (D3): Small in size with no significant disease.  Circumflex (LCx): Normal in size and nondominant. The vessel has no significant disease.  Ramus Intermedius: Large in size with no significant disease.  Right Coronary Artery: Normal in size and dominant. The vessel is mildly calcified. There is 30% ostial stenosis and 30% tubular proximal disease.  Posterior descending artery: Normal in size with no significant disease.  Posterior AV segment: Medium in size with no significant disease.  Posterolateral branchs: No significant disease. Left ventriculography: Was not performed. I could not cross the aortic valve due to tortuosity in the innominate artery.  Final Conclusions:  Mild nonobstructive coronary artery disease.   History and Hospital Course:  Caleb Bryant is a 80 y.o. male with HTN, dyslipidemia and chest pain who presented 11/04/2013 with chest pain and DOE. He was found to have rapid atrial flutter. He was admitted and underwent cardiac catheterization 11/05/2013 which revealed mild nonobstructive CAD. He tolerated this procedure well without  complication. He has not had syncope. He has minimal palpitations. He was started on metoprolol for rate control. He feels better with adequate rate control. He also started anticoagulation with Eliquis for stroke prevention. He remains hemodynamically stable and his rate is overall well controlled. He was evaluated by EP and Dr. Lovena Le discussed options for treatment with him. He elected EP study +RF ablation. He has been seen, examined and deemed stable for discharge home today by Dr. Cristopher Peru. He will continue Eliquis uninterrupted and return in 3-4 weeks for EP study +RF ablation of atrial flutter. An echo will be done as an outpatient prior to his follow-up / procedure.  Discharge Vitals: Blood pressure 98/64, pulse 93, temperature 98 F (36.7 C), temperature source Oral, resp. rate 20, height _0  (1.676 m), weight 197 lb 11.2 oz (89.676 kg), SpO2 91.00%.   Labs: Lab Results  Component Value Date   WBC 6.7 11/06/2013   HGB 13.2 11/06/2013   HCT 39.3 11/06/2013   MCV 94.0 11/06/2013   PLT 176 11/06/2013     Recent Labs Lab 11/05/13 0355  NA 145  K 4.4  CL 106  CO2 26  BUN 19  CREATININE 1.10  CALCIUM 8.6  PROT 6.6  BILITOT 0.5  ALKPHOS 59  ALT 14  AST 21  GLUCOSE 117*   Lab Results  Component Value Date   TROPONINI <0.30 11/05/2013    Lab Results  Component Value Date   CHOL 127 11/05/2013   CHOL 145 09/05/2012   CHOL 135 02/29/2012   Lab Results  Component Value Date   HDL 39* 11/05/2013   HDL 40.10 09/05/2012   HDL 48.20 02/29/2012   Lab Results  Component Value Date   LDLCALC 75  11/05/2013   LDLCALC 92 09/05/2012   LDLCALC 74 02/29/2012   Lab Results  Component Value Date   TRIG 67 11/05/2013   TRIG 66.0 09/05/2012   TRIG 66.0 02/29/2012   Lab Results  Component Value Date   CHOLHDL 3.3 11/05/2013   CHOLHDL 4 09/05/2012   CHOLHDL 3 02/29/2012    Recent Labs  11/04/13 2315  INR 1.00    Disposition:  The patient is being discharged in stable condition.  Follow-up:       Follow-up Information   Follow up with [facility] On 11/26/2013. (Please arrive to hospital (check in for Short Stay) at 8:30 AM for EP study and atrial flutter ablation procedure)    Specialty:  Electrophysiology   Contact information:   [address] [id number] [place] [address] [phone]      Follow up with Cristopher Peru, MD On 11/21/2013. (At 12:15 PM to discuss atrial flutter ablation)    Specialty:  Cardiology   Contact information:   [address] [place] [address] [phone]       Follow up with [facility] On 11/13/2013. (At 12:00 noon for echocardiogram)    Specialty:  Cardiology   Contact information:   [address] [place] [address] [phone]     Discharge Medications:    Medication List    STOP taking these medications       hydrochlorothiazide 25 MG tablet  Commonly known as:  HYDRODIURIL      TAKE these medications       apixaban 5 MG Tabs tablet  Commonly known as:  ELIQUIS  Take 1 tablet (5 mg total) by mouth 2 (two) times daily.     cyanocobalamin 1000 MCG/ML injection  Commonly known as:  (VITAMIN B-12)  Inject 1,000 mcg into the muscle every 30 (thirty) days.     metoprolol tartrate 25 MG tablet  Commonly known as:  LOPRESSOR  Take 1 tablet (25 mg total) by mouth 2 (two) times daily.     naproxen sodium 220 MG tablet  Commonly known as:  ANAPROX  Take 440 mg by mouth daily as needed (pain).     prednisoLONE sodium phosphate 1 % ophthalmic solution  Commonly known as:  INFLAMASE FORTE  Place 1 drop into the right eye 4 (four) times daily.       Duration of Discharge Encounter: Greater than 30 minutes including physician time.  Signed, Ileene Hutchinson, PA-C 11/06/2013, 9:37 AM

## 2013-11-06 NOTE — Telephone Encounter (Signed)
Gave patient samples

## 2013-11-06 NOTE — Progress Notes (Signed)
Arranged echo to be done as outpatient. Appt made in [place] office on 11/13/2013 at 12:00 noon. Entered into DC AVS.

## 2013-11-06 NOTE — Progress Notes (Signed)
Patient: Caleb Bryant Date of Encounter: 11/06/2013, 7:53 AM Admit date: 11/04/2013     Subjective  Caleb Bryant has no complaints.   Objective  Physical Exam: Vitals: BP 98/64  Pulse 93  Temp(Src) 98 F (36.7 C) (Oral)  Resp 20  Ht _0  (1.676 m)  Wt 197 lb 11.2 oz (89.676 kg)  BMI 31.92 kg/m2  SpO2 91% General: Well developed, well appearing 80 year old male in no acute distress. Neck: Supple. JVD not elevated. Lungs: Clear bilaterally to auscultation without wheezes, rales, or rhonchi. Breathing is unlabored. Heart: S1 S2 without murmurs, rubs, or gallops.  Abdomen: Soft, non-distended. Extremities: No clubbing or cyanosis. No edema.  Distal pedal pulses are 2+ and equal bilaterally. Neuro: Alert and oriented X 3. Moves all extremities spontaneously. No focal deficits.  Intake/Output:  Intake/Output Summary (Last 24 hours) at 11/06/13 0753 Last data filed at 11/06/13 0533  Gross per 24 hour  Intake 1545.41 ml  Output   1100 ml  Net 445.41 ml    Inpatient Medications:  . apixaban  5 mg Oral BID  . metoprolol tartrate  25 mg Oral BID  . prednisoLONE acetate  1 drop Right Eye QID  . sodium chloride  3 mL Intravenous Q12H    Labs:  Recent Labs  11/04/13 1932 11/05/13 0355  NA 142 145  K 4.3 4.4  CL 103 106  CO2 25 26  GLUCOSE 105* 117*  BUN 19 19  CREATININE 0.99 1.10  CALCIUM 8.6 8.6    Recent Labs  11/05/13 0355  AST 21  ALT 14  ALKPHOS 59  BILITOT 0.5  PROT 6.6  ALBUMIN 3.4*    Recent Labs  11/04/13 1932 11/04/13 2315 11/06/13 0335  WBC 8.3 9.8 6.7  NEUTROABS 4.5 5.4  --   HGB 14.0 14.7 13.2  HCT 41.0 42.5 39.3  MCV 92.6 93.0 94.0  PLT 205 228 176    Recent Labs  11/04/13 2315 11/05/13 0355  TROPONINI <0.30 <0.30    Recent Labs  11/05/13 0355  CHOL 127  HDL 39*  LDLCALC 75  TRIG 67  CHOLHDL 3.3    Recent Labs  11/04/13 2315  TSH 2.603    Recent Labs  11/04/13 2315  INR 1.00    Radiology/Studies: Dg  Chest 2 View  11/04/2013   CLINICAL DATA:  Irregular heartbeat and shortness of breath.  EXAM: CHEST - 2 VIEW  COMPARISON:  None  FINDINGS: Lungs show diffuse interstitial prominence as well as fibrotic changes. This is most likely consistent with pulmonary fibrosis/UIP. There may be a component of mild interstitial edema as well. Correlation suggested with any known history of chronic lung disease. No overt airspace edema or pleural fluid identified.  The heart is mildly enlarged. The aorta is mildly ectatic. Degenerative changes are present of the thoracic spine.  IMPRESSION: By chest x-ray, interstitial prominence and fibrotic changes are felt to most likely represent chronic pulmonary fibrosis/UIP. There may also be a component of mild interstitial edema. Prior chest x-rays would be helpful eventually for comparison.   Electronically Signed   By: Aletta Edouard M.D.   On: 11/04/2013 20:18   Echocardiogram pending Telemetry:    Assessment and Plan  1. Atrial flutter 2. Nonobstructive CAD  Discussed treatment options for atrial flutter. Plan to DC home today and return in 3-4 weeks for flutter ablation. Continue Eliquis for stroke prevention and metoprolol for rate control.  Dr. Lovena Bryant to see Signed, EDMISTEN,  Hunt Oris  EP Attending  Patient seen and examined. He feels well this morning. His ventricular rate in atrial flutter is reasonably well controlled. He will go to the office this morning on discharge for samples of Eliquis and return in 3-4 weeks for catheter ablation of his atrial flutter. He may want to see me in the office and an appointment will be made to allow him to do this.  Caleb Bryant.D.

## 2013-11-13 ENCOUNTER — Other Ambulatory Visit: Payer: Self-pay

## 2013-11-13 ENCOUNTER — Other Ambulatory Visit (INDEPENDENT_AMBULATORY_CARE_PROVIDER_SITE_OTHER): Payer: Medicare Other

## 2013-11-13 DIAGNOSIS — R9431 Abnormal electrocardiogram [ECG] [EKG]: Secondary | ICD-10-CM

## 2013-11-13 DIAGNOSIS — R079 Chest pain, unspecified: Secondary | ICD-10-CM

## 2013-11-13 DIAGNOSIS — I4892 Unspecified atrial flutter: Secondary | ICD-10-CM | POA: Diagnosis not present

## 2013-11-13 DIAGNOSIS — R0602 Shortness of breath: Secondary | ICD-10-CM | POA: Diagnosis not present

## 2013-11-20 ENCOUNTER — Encounter (HOSPITAL_COMMUNITY): Payer: Self-pay

## 2013-11-20 ENCOUNTER — Encounter (HOSPITAL_COMMUNITY): Payer: Self-pay | Admitting: Pharmacy Technician

## 2013-11-21 ENCOUNTER — Ambulatory Visit (INDEPENDENT_AMBULATORY_CARE_PROVIDER_SITE_OTHER): Payer: Medicare Other | Admitting: Internal Medicine

## 2013-11-21 ENCOUNTER — Encounter: Payer: Self-pay | Admitting: Internal Medicine

## 2013-11-21 VITALS — BP 120/76 | HR 96 | Ht 60.5 in | Wt 197.0 lb

## 2013-11-21 DIAGNOSIS — I4892 Unspecified atrial flutter: Secondary | ICD-10-CM | POA: Diagnosis not present

## 2013-11-21 DIAGNOSIS — I10 Essential (primary) hypertension: Secondary | ICD-10-CM

## 2013-11-21 NOTE — Patient Instructions (Signed)
Your physician recommends that you continue on your current medications as directed. Please refer to the Current Medication list given to you today.  Keep scheduled ablation procedure

## 2013-11-21 NOTE — Assessment & Plan Note (Signed)
His blood pressure is well controlled. He will continue his current meds.

## 2013-11-21 NOTE — Progress Notes (Signed)
HPI Mr. Boley returns today for followup. He is a pleasant 80 yo man with atrial flutter who underwent catheterization for chest pain several weeks ago. He was found to have atrial flutter. He was placed on anti-coagulation with Xarelto. He has been stable except for fatigue. He denies chest pain or sob. He has weakness with exertion. He cannot feel his heart out of rhythm.  Allergies  Allergen Reactions  . Codeine Nausea And Vomiting     Current Outpatient Prescriptions  Medication Sig Dispense Refill  . acetaminophen (TYLENOL) 500 MG tablet Take 1,000 mg by mouth daily as needed (pain).      Marland Kitchen apixaban (ELIQUIS) 5 MG TABS tablet Take 1 tablet (5 mg total) by mouth 2 (two) times daily.  60 tablet  4  . cyanocobalamin (,VITAMIN B-12,) 1000 MCG/ML injection Inject 1,000 mcg into the muscle every 30 (thirty) days. Last injection 1st week of January      . metoprolol tartrate (LOPRESSOR) 25 MG tablet Take 1 tablet (25 mg total) by mouth 2 (two) times daily.  60 tablet  4  . naproxen sodium (ALEVE) 220 MG tablet Take 440 mg by mouth 2 (two) times daily as needed (pain).      . prednisoLONE acetate (PRED FORTE) 1 % ophthalmic suspension Place 1 drop into the right eye 4 (four) times daily.       No current facility-administered medications for this visit.     Past Medical History  Diagnosis Date  . Colon, diverticulosis   . PVD (peripheral vascular disease)   . Benign prostatic hypertrophy   . Hypertension   . Hyperlipidemia   . Atrial flutter     "just dx'd today" (11/04/2013)  . Exertional shortness of breath   . Borderline diabetes   . Osteoarthritis     "all over" (11/04/2013)  . Gout   . Anxiety     ROS:   All systems reviewed and negative except as noted in the HPI.   Past Surgical History  Procedure Laterality Date  . Partial colectomy  1997    "diverticulitis; busted; [facility]nett"  . Appendectomy  1997    "days before 1st colon OR"  . Tonsillectomy and  adenoidectomy      child  . Ankle fracture surgery Right   . Colonoscopy with esophagogastroduodenoscopy (egd)  12/00    gastic polyp   . Stress cardiolite negative  9/02    EF 60%  . Cholecystectomy  12/03  . Colonoscopy with esophagogastroduodenoscopy (egd)  11/07    gastritis, 2 gastic polyps  . Ventral hernia repair  11/07    [facility]nett  . Colostomy  1997  . Colostomy reversal      "think I wore it 4 months" (11/04/2013)  . Cataract extraction, bilateral Bilateral   . Corneal transplant Right   . Colon surgery      "diverticulitis"  . Inguinal hernia repair Left   . Hernia repair       Family History  Problem Relation Age of Onset  . Coronary artery disease Sister   . Heart failure Mother      History   Social History  . Marital Status: Married    Spouse Name: N/A    Number of Children: 3  . Years of Education: N/A   Occupational History  .     Social History Main Topics  . Smoking status: Former Smoker -- 3.00 packs/day for 30 years    Types: Cigarettes  .  Smokeless tobacco: Former Systems developer    Types: Chew     Comment: 11/04/2013 "quit smoking in the 1980's or so; quit chewing before I quit smoking"  . Alcohol Use: Yes     Comment: 11/04/2013 "quit driking in 1980's; was called a weekend alcoholic"  . Drug Use: No  . Sexual Activity: No   Other Topics Concern  . Not on file   Social History Narrative   Still does lawn work.    No living will, Wife is HCPOA.     BP 120/76  Pulse 96  Ht 5' 0.5" (1.537 m)  Wt 197 lb (89.359 kg)  BMI 37.83 kg/m2  Physical Exam:  Well appearing 80 yo man, NAD HEENT: Unremarkable Neck:  7 cm JVD, no thyromegally Back:  No CVA tenderness Lungs:  Clear HEART:  Regular rate rhythm, no murmurs, no rubs, no clicks Abd:  soft, positive bowel sounds, no organomegally, no rebound, no guarding Ext:  2 plus pulses, no edema, no cyanosis, no clubbing Skin:  No rashes no nodules Neuro:  CN II through XII intact, motor grossly  intact  EKG - atrial flutter with a controlled VR   Assess/Plan:

## 2013-11-21 NOTE — Assessment & Plan Note (Signed)
He remains in atrial flutter with an RVR. I have discussed the treatment options and the risks/benefits/goals/expectations of catheter ablation of atrial flutter have been discussed with the patient and he wishes to proceed. He has been therapeutically anti-coagulated.

## 2013-11-25 MED ORDER — SODIUM CHLORIDE 0.9 % IV SOLN
INTRAVENOUS | Status: DC
  Administered 2013-11-26: 10:00:00 via INTRAVENOUS

## 2013-11-26 ENCOUNTER — Encounter (HOSPITAL_COMMUNITY)
Admission: RE | Disposition: A | Discharge status: Home / Self Care | Payer: Self-pay | Source: Ambulatory Visit | Attending: Internal Medicine

## 2013-11-26 ENCOUNTER — Ambulatory Visit (HOSPITAL_COMMUNITY)
Admission: RE | Admit: 2013-11-26 | Discharge: 2013-11-27 | Disposition: A | Discharge status: Home / Self Care | Payer: Medicare Other | Source: Ambulatory Visit | Attending: Internal Medicine | Admitting: Internal Medicine

## 2013-11-26 ENCOUNTER — Encounter (HOSPITAL_COMMUNITY): Payer: Self-pay

## 2013-11-26 DIAGNOSIS — N400 Enlarged prostate without lower urinary tract symptoms: Secondary | ICD-10-CM | POA: Diagnosis not present

## 2013-11-26 DIAGNOSIS — Z87891 Personal history of nicotine dependence: Secondary | ICD-10-CM | POA: Diagnosis not present

## 2013-11-26 DIAGNOSIS — Z7901 Long term (current) use of anticoagulants: Secondary | ICD-10-CM | POA: Insufficient documentation

## 2013-11-26 DIAGNOSIS — M1990 Unspecified osteoarthritis, unspecified site: Secondary | ICD-10-CM | POA: Diagnosis not present

## 2013-11-26 DIAGNOSIS — R079 Chest pain, unspecified: Secondary | ICD-10-CM

## 2013-11-26 DIAGNOSIS — E119 Type 2 diabetes mellitus without complications: Secondary | ICD-10-CM

## 2013-11-26 DIAGNOSIS — R42 Dizziness and giddiness: Secondary | ICD-10-CM

## 2013-11-26 DIAGNOSIS — I10 Essential (primary) hypertension: Secondary | ICD-10-CM | POA: Diagnosis not present

## 2013-11-26 DIAGNOSIS — M25512 Pain in left shoulder: Secondary | ICD-10-CM

## 2013-11-26 DIAGNOSIS — R0609 Other forms of dyspnea: Secondary | ICD-10-CM

## 2013-11-26 DIAGNOSIS — I739 Peripheral vascular disease, unspecified: Secondary | ICD-10-CM

## 2013-11-26 DIAGNOSIS — I428 Other cardiomyopathies: Secondary | ICD-10-CM | POA: Insufficient documentation

## 2013-11-26 DIAGNOSIS — F411 Generalized anxiety disorder: Secondary | ICD-10-CM | POA: Diagnosis not present

## 2013-11-26 DIAGNOSIS — E7800 Pure hypercholesterolemia, unspecified: Secondary | ICD-10-CM

## 2013-11-26 DIAGNOSIS — G609 Hereditary and idiopathic neuropathy, unspecified: Secondary | ICD-10-CM

## 2013-11-26 DIAGNOSIS — E785 Hyperlipidemia, unspecified: Secondary | ICD-10-CM | POA: Insufficient documentation

## 2013-11-26 DIAGNOSIS — M109 Gout, unspecified: Secondary | ICD-10-CM | POA: Diagnosis not present

## 2013-11-26 DIAGNOSIS — K5730 Diverticulosis of large intestine without perforation or abscess without bleeding: Secondary | ICD-10-CM | POA: Diagnosis not present

## 2013-11-26 DIAGNOSIS — F0390 Unspecified dementia without behavioral disturbance: Secondary | ICD-10-CM

## 2013-11-26 DIAGNOSIS — E538 Deficiency of other specified B group vitamins: Secondary | ICD-10-CM

## 2013-11-26 DIAGNOSIS — I4892 Unspecified atrial flutter: Secondary | ICD-10-CM | POA: Diagnosis not present

## 2013-11-26 DIAGNOSIS — M25511 Pain in right shoulder: Secondary | ICD-10-CM

## 2013-11-26 DIAGNOSIS — M542 Cervicalgia: Secondary | ICD-10-CM

## 2013-11-26 DIAGNOSIS — R7309 Other abnormal glucose: Secondary | ICD-10-CM | POA: Insufficient documentation

## 2013-11-26 DIAGNOSIS — F03A0 Unspecified dementia, mild, without behavioral disturbance, psychotic disturbance, mood disturbance, and anxiety: Secondary | ICD-10-CM

## 2013-11-26 DIAGNOSIS — J309 Allergic rhinitis, unspecified: Secondary | ICD-10-CM

## 2013-11-26 DIAGNOSIS — R0602 Shortness of breath: Secondary | ICD-10-CM | POA: Diagnosis not present

## 2013-11-26 DIAGNOSIS — I2510 Atherosclerotic heart disease of native coronary artery without angina pectoris: Secondary | ICD-10-CM | POA: Diagnosis not present

## 2013-11-26 DIAGNOSIS — E1149 Type 2 diabetes mellitus with other diabetic neurological complication: Secondary | ICD-10-CM

## 2013-11-26 DIAGNOSIS — I200 Unstable angina: Secondary | ICD-10-CM

## 2013-11-26 HISTORY — PX: ATRIAL FLUTTER ABLATION: SHX5733

## 2013-11-26 LAB — CBC
HCT: 44.5 % (ref 39.0–52.0)
HEMOGLOBIN: 15.8 g/dL (ref 13.0–17.0)
MCH: 32.4 pg (ref 26.0–34.0)
MCHC: 35.5 g/dL (ref 30.0–36.0)
MCV: 91.4 fL (ref 78.0–100.0)
PLATELETS: 232 10*3/uL (ref 150–400)
RBC: 4.87 MIL/uL (ref 4.22–5.81)
RDW: 13.7 % (ref 11.5–15.5)
WBC: 9.1 10*3/uL (ref 4.0–10.5)

## 2013-11-26 LAB — BASIC METABOLIC PANEL
BUN: 21 mg/dL (ref 6–23)
CO2: 25 mEq/L (ref 19–32)
Calcium: 9.2 mg/dL (ref 8.4–10.5)
Chloride: 103 mEq/L (ref 96–112)
Creatinine, Ser: 1.16 mg/dL (ref 0.50–1.35)
GFR calc Af Amer: 68 mL/min — ABNORMAL LOW (ref >90)
GFR, EST NON AFRICAN AMERICAN: 59 mL/min — AB (ref >90)
Glucose, Bld: 111 mg/dL — ABNORMAL HIGH (ref 70–99)
POTASSIUM: 4.7 meq/L (ref 3.7–5.3)
SODIUM: 141 meq/L (ref 137–147)

## 2013-11-26 LAB — PROTIME-INR
INR: 1.29 (ref 0.00–1.49)
PROTHROMBIN TIME: 15.8 s — AB (ref 11.6–15.2)

## 2013-11-26 SURGERY — ATRIAL FLUTTER ABLATION
Anesthesia: LOCAL

## 2013-11-26 MED ORDER — BUPIVACAINE HCL (PF) 0.25 % IJ SOLN
INTRAMUSCULAR | Status: AC
  Filled 2013-11-26: qty 30

## 2013-11-26 MED ORDER — MIDAZOLAM HCL 5 MG/5ML IJ SOLN
INTRAMUSCULAR | Status: AC
  Filled 2013-11-26: qty 5

## 2013-11-26 MED ORDER — SODIUM CHLORIDE 0.9 % IJ SOLN: 3.0000 mL | INTRAMUSCULAR | Status: DC | PRN

## 2013-11-26 MED ORDER — CYANOCOBALAMIN 1000 MCG/ML IJ SOLN: 1000.0000 ug | INTRAMUSCULAR | Status: DC

## 2013-11-26 MED ORDER — APIXABAN 5 MG PO TABS
5.0000 mg | ORAL_TABLET | Freq: Two times a day (BID) | ORAL | Status: DC
  Administered 2013-11-26: 5 mg via ORAL
  Filled 2013-11-26 (×3): qty 1

## 2013-11-26 MED ORDER — PREDNISOLONE ACETATE 1 % OP SUSP
1.0000 [drp] | Freq: Four times a day (QID) | OPHTHALMIC | Status: DC
  Administered 2013-11-26 (×2): 1 [drp] via OPHTHALMIC
  Filled 2013-11-26: qty 1

## 2013-11-26 MED ORDER — ACETAMINOPHEN 325 MG PO TABS
650.0000 mg | ORAL_TABLET | ORAL | Status: DC | PRN
  Administered 2013-11-27: 650 mg via ORAL
  Filled 2013-11-26: qty 2

## 2013-11-26 MED ORDER — SODIUM CHLORIDE 0.9 % IJ SOLN
3.0000 mL | Freq: Two times a day (BID) | INTRAMUSCULAR | Status: DC
Start: 2013-11-26 — End: 2013-11-27
  Administered 2013-11-26: 3 mL via INTRAVENOUS

## 2013-11-26 MED ORDER — METOPROLOL TARTRATE 25 MG PO TABS
25.0000 mg | ORAL_TABLET | Freq: Two times a day (BID) | ORAL | Status: DC
  Administered 2013-11-26: 25 mg via ORAL
  Filled 2013-11-26 (×3): qty 1

## 2013-11-26 MED ORDER — ACETAMINOPHEN 500 MG PO TABS: 1000.0000 mg | ORAL_TABLET | Freq: Every day | ORAL | Status: DC | PRN

## 2013-11-26 MED ORDER — ONDANSETRON HCL 4 MG/2ML IJ SOLN: 4.0000 mg | Freq: Four times a day (QID) | INTRAMUSCULAR | Status: DC | PRN

## 2013-11-26 MED ORDER — SODIUM CHLORIDE 0.9 % IV SOLN: 250.0000 mL | INTRAVENOUS | Status: DC | PRN

## 2013-11-26 MED ORDER — FENTANYL CITRATE 0.05 MG/ML IJ SOLN
INTRAMUSCULAR | Status: AC
  Filled 2013-11-26: qty 2

## 2013-11-26 NOTE — Interval H&P Note (Signed)
History and Physical Interval Note: since prior clinic, no change in the history, physical exam, assessment and plan.  11/26/2013 10:47 AM  Caleb Bryant  has presented today for surgery, with the diagnosis of Aflutter  The various methods of treatment have been discussed with the patient and family. After consideration of risks, benefits and other options for treatment, the patient has consented to  Procedure(s): ATRIAL FLUTTER ABLATION (N/A) as a surgical intervention .  The patient's history has been reviewed, patient examined, no change in status, stable for surgery.  I have reviewed the patient's chart and labs.  Questions were answered to the patient's satisfaction.     Mikle Bosworth.D.

## 2013-11-26 NOTE — CV Procedure (Signed)
EPS/RFA of atrial flutter without immediate complication. T#[id number].

## 2013-11-26 NOTE — H&P (View-Only) (Signed)
HPI Mr. Boley returns today for followup. He is a pleasant 80 yo man with atrial flutter who underwent catheterization for chest pain several weeks ago. He was found to have atrial flutter. He was placed on anti-coagulation with Xarelto. He has been stable except for fatigue. He denies chest pain or sob. He has weakness with exertion. He cannot feel his heart out of rhythm.  Allergies  Allergen Reactions  . Codeine Nausea And Vomiting     Current Outpatient Prescriptions  Medication Sig Dispense Refill  . acetaminophen (TYLENOL) 500 MG tablet Take 1,000 mg by mouth daily as needed (pain).      Marland Kitchen apixaban (ELIQUIS) 5 MG TABS tablet Take 1 tablet (5 mg total) by mouth 2 (two) times daily.  60 tablet  4  . cyanocobalamin (,VITAMIN B-12,) 1000 MCG/ML injection Inject 1,000 mcg into the muscle every 30 (thirty) days. Last injection 1st week of January      . metoprolol tartrate (LOPRESSOR) 25 MG tablet Take 1 tablet (25 mg total) by mouth 2 (two) times daily.  60 tablet  4  . naproxen sodium (ALEVE) 220 MG tablet Take 440 mg by mouth 2 (two) times daily as needed (pain).      . prednisoLONE acetate (PRED FORTE) 1 % ophthalmic suspension Place 1 drop into the right eye 4 (four) times daily.       No current facility-administered medications for this visit.     Past Medical History  Diagnosis Date  . Colon, diverticulosis   . PVD (peripheral vascular disease)   . Benign prostatic hypertrophy   . Hypertension   . Hyperlipidemia   . Atrial flutter     "just dx'd today" (11/04/2013)  . Exertional shortness of breath   . Borderline diabetes   . Osteoarthritis     "all over" (11/04/2013)  . Gout   . Anxiety     ROS:   All systems reviewed and negative except as noted in the HPI.   Past Surgical History  Procedure Laterality Date  . Partial colectomy  1997    "diverticulitis; busted; Byrnett"  . Appendectomy  1997    "days before 1st colon OR"  . Tonsillectomy and  adenoidectomy      child  . Ankle fracture surgery Right   . Colonoscopy with esophagogastroduodenoscopy (egd)  12/00    gastic polyp   . Stress cardiolite negative  9/02    EF 60%  . Cholecystectomy  12/03  . Colonoscopy with esophagogastroduodenoscopy (egd)  11/07    gastritis, 2 gastic polyps  . Ventral hernia repair  11/07    [facility]rnett  . Colostomy  1997  . Colostomy reversal      "think I wore it 4 months" (11/04/2013)  . Cataract extraction, bilateral Bilateral   . Corneal transplant Right   . Colon surgery      "diverticulitis"  . Inguinal hernia repair Left   . Hernia repair       Family History  Problem Relation Age of Onset  . Coronary artery disease Sister   . Heart failure Mother      History   Social History  . Marital Status: Married    Spouse Name: N/A    Number of Children: 3  . Years of Education: N/A   Occupational History  .     Social History Main Topics  . Smoking status: Former Smoker -- 3.00 packs/day for 30 years    Types: Cigarettes  .  Smokeless tobacco: Former Systems developer    Types: Chew     Comment: 11/04/2013 "quit smoking in the 1980's or so; quit chewing before I quit smoking"  . Alcohol Use: Yes     Comment: 11/04/2013 "quit driking in 1980's; was called a weekend alcoholic"  . Drug Use: No  . Sexual Activity: No   Other Topics Concern  . Not on file   Social History Narrative   Still does lawn work.    No living will, Wife is HCPOA.     BP 120/76  Pulse 96  Ht 5' 0.5" (1.537 m)  Wt 197 lb (89.359 kg)  BMI 37.83 kg/m2  Physical Exam:  Well appearing 80 yo man, NAD HEENT: Unremarkable Neck:  7 cm JVD, no thyromegally Back:  No CVA tenderness Lungs:  Clear HEART:  Regular rate rhythm, no murmurs, no rubs, no clicks Abd:  soft, positive bowel sounds, no organomegally, no rebound, no guarding Ext:  2 plus pulses, no edema, no cyanosis, no clubbing Skin:  No rashes no nodules Neuro:  CN II through XII intact, motor grossly  intact  EKG - atrial flutter with a controlled VR   Assess/Plan:

## 2013-11-27 DIAGNOSIS — I428 Other cardiomyopathies: Secondary | ICD-10-CM | POA: Diagnosis not present

## 2013-11-27 DIAGNOSIS — I2510 Atherosclerotic heart disease of native coronary artery without angina pectoris: Secondary | ICD-10-CM | POA: Diagnosis not present

## 2013-11-27 DIAGNOSIS — I10 Essential (primary) hypertension: Secondary | ICD-10-CM | POA: Diagnosis not present

## 2013-11-27 DIAGNOSIS — I4892 Unspecified atrial flutter: Secondary | ICD-10-CM

## 2013-11-27 MED ORDER — CARVEDILOL 3.125 MG PO TABS: 3.1250 mg | ORAL_TABLET | Freq: Two times a day (BID) | ORAL | Status: DC

## 2013-11-27 NOTE — Op Note (Signed)
NAME:  Caleb Bryant, Caleb Bryant              ACCOUNT NO.:  [id number]  MEDICAL RECORD NO.:  [id number]  LOCATION:  [id number]                        FACILITY:  [facility]  PHYSICIAN:  Champ Mungo. Lovena Le, MD    DATE OF BIRTH:  07/10/1934  DATE OF PROCEDURE:  11/26/2013 DATE OF DISCHARGE:                              OPERATIVE REPORT   PROCEDURE PERFORMED:  Electrophysiologic study and RF catheter ablation of atrial flutter.  INTRODUCTION:  The patient is a very pleasant 80 year old man with a several-month history of typical atrial flutter.  He has been therapeutically anticoagulated and now presents for catheter ablation of his symptomatic atrial flutter.  PROCEDURE:  After informed consent was obtained, the patient was taken to the diagnostic EP lab in a fasting state.  After usual preparation and draping, intravenous fentanyl and midazolam was given for sedation. A 6-French hexapolar catheter was inserted percutaneously into the right jugular vein and advanced to the coronary sinus.  A 6-French quadripolar catheter was inserted percutaneously in the right femoral vein and advanced to the His bundle region.  Mapping was carried out, which demonstrated typical anti-counterclockwise tricuspid annular reentrant atrial flutter.  The cycle length was around 220 milliseconds.  The ablation catheter, a 7-French quadripolar catheter was inserted percutaneously through the right femoral vein and advanced under fluoroscopic guidance into the right atrium.  Mapping around the tricuspid annulus, the superior vena cava, and the coronary sinus were carried out.  The orientation and size was somewhat smaller than usual. The ablation catheter was maneuvered onto the tricuspid valve annulus. A single RF energy application was then delivered between the tricuspid valve annulus back to the inferior vena cava, resulting in termination of atrial flutter, and restoration of sinus rhythm.  Following ablation, which lasted  approximately 200 seconds, there was evidence of isthmus block.  The stimulus to atrial interval at the local atrial electrogram in the atrial flutter isthmus was approximately 160 milliseconds after ablation.  At this point, pacing was carried out from the right ventricle.  Rapid ventricular pacing was carried out from the right ventricle after the ablation catheter was maneuvered into the right ventricle and stepwise decreased from 500 milliseconds down to 320 milliseconds where VA Wenckebach was observed.  During rapid ventricular pacing, the atrial activation sequence was midline and decremental. Next, programmed ventricular stimulation was carried out from the right ventricle at a base drive cycle length of 500 milliseconds.  The S1-S2 interval was stepwise decreased from 440 milliseconds down to 250 millisecond where ventricular refractoriness was observed.  During programmed ventricular stimulation, the atrial activation sequence was midline and decremental.  Next, programmed atrial stimulation was carried out from the atrium at a base drive cycle length of 500 milliseconds.  The S1 and S2 interval was stepwise decreased from 440 milliseconds down to 290 milliseconds where the AV node ERP was observed.  During programmed atrial stimulation, there were no AH jumps, no echo beats, and no inducible SVT.  Finally, rapid atrial pacing was carried out from the atrium at a pacing cycle length of 600 milliseconds and stepwise decreased down to 330 milliseconds where AV Wenckebach was observed.  During rapid atrial pacing, the PR interval was less  than the RR interval and there was no inducible SVT.  The patient was observed for approximately 30 minutes.  During this time, additional rapid atrial pacing was carried out from the lateral right atrium from the atrial flutter isthmus as well as from the left atrium in the coronary sinus, all of which demonstrated bidirectional block in the  atrial flutter isthmus.  The catheters were then removed, hemostasis was assured, the patient was returned to his room in satisfactory condition.  COMPLICATIONS:  There were no immediate procedure complications.  RESULTS:  A.  Baseline ECG.  Baseline ECG demonstrates atrial flutter with atrial flutter cycle length of 220 milliseconds and variable AV conduction. B.  Baseline intervals.  The atrial flutter cycle length was 210 milliseconds.  The HV interval was 38 milliseconds.  The QRS duration was 144 milliseconds.  The intervals were not significantly changed following catheter ablation. C.  Rapid ventricular pacing.  Rapid ventricular pacing was carried out from the right ventricle at a base drive cycle length of 600 milliseconds and stepwise decreased down to 320 milliseconds where VA Wenckebach was observed.  During rapid ventricular pacing, the atrial activation was midline and decremental. D.  Programmed ventricular stimulation.  Programmed ventricular stimulation was carried out from the right ventricle at a base drive cycle length of 500 milliseconds.  The S1 and S2 interval stepwise decreased down to 250 milliseconds where ventricular refractoriness was observed.  During programmed ventricular stimulation, the atrial activation was midline and decremental. E.  Programmed atrial stimulation.  Programmed atrial stimulation was carried out from the atrium at a base drive cycle length of 500 milliseconds.  The S1 and S2 interval was stepwise decreased down to 290 milliseconds where the AV node ERP was observed.  During programmed atrial stimulation, there was no AH jump and no inducible SVT. F.  Rapid atrial pacing.  Rapid atrial pacing was carried out from the atrium at a pacing cycle length of 600 milliseconds.  The pacing cycle length was stepwise decreased down to 330 milliseconds where AV Wenckebach was observed.  During rapid atrial pacing, the PR interval was less than  the RR interval, and there was no inducible SVT. G.  Arrhythmias observed. 1. Atrial flutter initiation was present at the time of EP study.  The     duration was sustained.  The cycle length was 220 milliseconds.     The method of termination was with catheter ablation.     a.     Mapping.  Mapping of Koch's triangle and the tricuspid valve      isthmus demonstrated normal size and orientation.     b.     RF energy application.  A total of 1 RF energy application      was delivered to the atrial flutter isthmus resulting in atrial      flutter termination and restoration of sinus rhythm, and creation      of bidirectional block in the atrial flutter isthmus.  CONCLUSION:  This study demonstrates successful electrophysiologic study and RF catheter ablation of atrial flutter with a single RF energy application to the atrial flutter isthmus resulting in termination of flutter, restoration of sinus rhythm, creation of bidirectional block and atrial flutter isthmus.     Champ Mungo. Lovena Le, MD     GWT/MEDQ  D:  11/26/2013  T:  11/27/2013  Job:  [id number]

## 2013-11-27 NOTE — Discharge Instructions (Signed)
No driving for 3 days. No lifting over 5 lbs for 1 week. No sexual activity for 1 week. Keep procedure site clean & dry. If you notice increased pain, swelling, bleeding or pus, call/return! You may shower, but no soaking baths/hot tubs/pools for 1 week. ° °

## 2013-11-27 NOTE — Progress Notes (Signed)
    Patient: Caleb Bryant Date of Encounter: 11/27/2013, 7:00 AM Admit date: 11/26/2013     Subjective  Caleb Bryant has no complaints.   Objective  Physical Exam: Vitals: BP 98/57  Pulse 73  Temp(Src) 98 F (36.7 C) (Oral)  Resp 18  Ht 5' 6.5" (1.689 m)  Wt 196 lb (88.905 kg)  BMI 31.16 kg/m2  SpO2 93% General: Well developed, well appearing 80 year old male in no acute distress. Neck: Supple. JVD not elevated. Lungs: Clear bilaterally to auscultation without wheezes, rales, or rhonchi. Breathing is unlabored. Heart: RRR S1 S2 without murmurs, rubs, or gallops.  Abdomen: Soft, non-distended. Extremities: No clubbing or cyanosis. No edema.  Distal pedal pulses are 2+ and equal bilaterally. Neuro: Alert and oriented X 3. Moves all extremities spontaneously. No focal deficits.  Intake/Output:  Intake/Output Summary (Last 24 hours) at 11/27/13 0700 Last data filed at 11/26/13 2103  Gross per 24 hour  Intake      3 ml  Output      0 ml  Net      3 ml    Inpatient Medications:  . apixaban  5 mg Oral BID  . [START ON 12/01/2013] cyanocobalamin  1,000 mcg Intramuscular Q30 days  . metoprolol tartrate  25 mg Oral BID  . prednisoLONE acetate  1 drop Right Eye QID  . sodium chloride  3 mL Intravenous Q12H    Labs:  Recent Labs  11/26/13 0901  NA 141  K 4.7  CL 103  CO2 25  GLUCOSE 111*  BUN 21  CREATININE 1.16  CALCIUM 9.2    Recent Labs  11/26/13 0901  WBC 9.1  HGB 15.8  HCT 44.5  MCV 91.4  PLT 232    Recent Labs  11/26/13 0901  INR 1.29    Radiology/Studies:  Echocardiogram 11/13/2013 Study Conclusions - Left ventricle: The cavity size was normal. There was mild concentric hypertrophy. Systolic function was severely reduced. The estimated ejection fraction was in the range of 25% to 30%. Diffuse hypokinesis. The study is not technically sufficient to allow evaluation of LV diastolic function. - Mitral valve: Mild regurgitation. - Left  atrium: The atrium was mildly dilated. - Right atrium: The atrium was mildly dilated. - Tricuspid valve: Mild regurgitation.  Telemetry: SR; brief, nonsustained SVT yesterday evening ~7 pm   Assessment and Plan  1. Atrial flutter s/p RF ablation yesterday - doing well post ablation 2. 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 Mild, nonobstructive CAD  Dr. Lovena Le to see Signed, Jacqualine Mau  EP Attending  Patient seen and examined. Agree with above exam, assessment and plan.   Mikle Bosworth.D.

## 2013-11-27 NOTE — Discharge Summary (Addendum)
ELECTROPHYSIOLOGY DISCHARGE SUMMARY    Patient ID: Caleb Bryant,  MRN: [id number], DOB/AGE: 06-26-1934 80 y.o.  Admit date: 11/26/2013 Discharge date: 11/27/2013  Primary Care Physician: Eliezer Lofts, MD Primary Cardiologist: Fletcher Anon, MD  Primary Discharge Diagnosis:  1. Atrial flutter s/p EPS +RF ablation of typical appearing atrial flutter  Secondary Discharge Diagnoses:  1. NICM, EF 25-30%, probably tachy-mediated 2. Mild, nonobstructive CAD 3. HTN  4. Dyslipidemia 5. Osteoarthritis  Procedures This Admission:  1. EP study +RF ablation of typical appearing atrial flutter 11/26/2013 RESULTS: A. Baseline ECG. Baseline ECG demonstrates atrial flutter  with atrial flutter cycle length of 220 milliseconds and variable AV  conduction.  B. Baseline intervals. The atrial flutter cycle length was 210  milliseconds. The HV interval was 38 milliseconds. The QRS duration  was 144 milliseconds. The intervals were not significantly changed  following catheter ablation.  C. Rapid ventricular pacing. Rapid ventricular pacing was carried out  from the right ventricle at a base drive cycle length of 600  milliseconds and stepwise decreased down to 320 milliseconds where VA  Wenckebach was observed. During rapid ventricular pacing, the atrial  activation was midline and decremental.  D. Programmed ventricular stimulation. Programmed ventricular  stimulation was carried out from the right ventricle at a base drive  cycle length of 500 milliseconds. The S1 and S2 interval stepwise  decreased down to 250 milliseconds where ventricular refractoriness was  observed. During programmed ventricular stimulation, the atrial  activation was midline and decremental.  E. Programmed atrial stimulation. Programmed atrial stimulation was  carried out from the atrium at a base drive cycle length of 500  milliseconds. The S1 and S2 interval was stepwise decreased down to 290  milliseconds where the AV  node ERP was observed. During programmed  atrial stimulation, there was no AH jump and no inducible SVT.  F. Rapid atrial pacing. Rapid atrial pacing was carried out from the  atrium at a pacing cycle length of 600 milliseconds. The pacing cycle  length was stepwise decreased down to 330 milliseconds where AV  Wenckebach was observed. During rapid atrial pacing, the PR interval  was less than the RR interval, and there was no inducible SVT.  G. Arrhythmias observed.  1. Atrial flutter initiation was present at the time of EP study. The  duration was sustained. The cycle length was 220 milliseconds.  The method of termination was with catheter ablation.  a. Mapping. Mapping of Koch's triangle and the tricuspid valve  isthmus demonstrated normal size and orientation.  b. RF energy application. A total of 1 RF energy application  was delivered to the atrial flutter isthmus resulting in atrial  flutter termination and restoration of sinus rhythm, and creation  of bidirectional block in the atrial flutter isthmus.  CONCLUSION: This study demonstrates successful electrophysiologic study  and RF catheter ablation of atrial flutter with a single RF energy  application to the atrial flutter isthmus resulting in termination of  flutter, restoration of sinus rhythm, creation of bidirectional block  and atrial flutter isthmus.  History and Hospital Course:  [name] is a pleasant 80 year old man with NICM, EF 25-30%, and atrial flutter who presented yesterday for comprehensive EP study +RF ablation of typical appearing atrial flutter. Please see details as outlined above. He tolerated this procedure well without any immediate complication. He remains hemodynamically stable and afebrile. His groin and lower neck access sites are intact without significant bleeding or hematoma. He has been given discharge instructions  including wound care and activity restrictions. Of note, metoprolol tartrate was  discontinued in favor of Coreg. His BP is too low to add ACEI/ARB at this time. He will continue Eliquis uninterrupted. He will follow-up in clinic in 4 weeks. He has been seen, examined and deemed stable for discharge today by Dr. Cristopher Peru.  Discharge Vitals: Blood pressure 98/57, pulse 73, temperature 98 F (36.7 C), temperature source Oral, resp. rate 18, height 5' 6.5" (1.689 m), weight 196 lb (88.905 kg), SpO2 93.00%.   Labs: Lab Results  Component Value Date   WBC 9.1 11/26/2013   HGB 15.8 11/26/2013   HCT 44.5 11/26/2013   MCV 91.4 11/26/2013   PLT 232 11/26/2013    Recent Labs Lab 11/26/13 0901  NA 141  K 4.7  CL 103  CO2 25  BUN 21  CREATININE 1.16  CALCIUM 9.2  GLUCOSE 111*   Recent Labs  11/26/13 0901  INR 1.29    Disposition:  The patient is being discharged in stable condition.  Follow-up: Follow-up Information   Follow up with Ileene Hutchinson, PA-C On 12/24/2013. (At 1:30 PM (Dr. Tanna Furry PA))    Specialty:  Cardiology   Contact information:   [address] [place] [address] [phone]      Discharge Medications:    Medication List    STOP taking these medications       metoprolol tartrate 25 MG tablet  Commonly known as:  LOPRESSOR      TAKE these medications       acetaminophen 500 MG tablet  Commonly known as:  TYLENOL  Take 1,000 mg by mouth daily as needed (pain).     ALEVE 220 MG tablet  Generic drug:  naproxen sodium  Take 440 mg by mouth 2 (two) times daily as needed (pain).     apixaban 5 MG Tabs tablet  Commonly known as:  ELIQUIS  Take 1 tablet (5 mg total) by mouth 2 (two) times daily.     carvedilol 3.125 MG tablet  Commonly known as:  COREG  Take 1 tablet (3.125 mg total) by mouth 2 (two) times daily with a meal.     cyanocobalamin 1000 MCG/ML injection  Commonly known as:  (VITAMIN B-12)  Inject 1,000 mcg into the muscle every 30 (thirty) days. Last injection 1st week of January      prednisoLONE acetate 1 % ophthalmic suspension  Commonly known as:  PRED FORTE  Place 1 drop into the right eye 4 (four) times daily.       Duration of Discharge Encounter: Greater than 30 minutes including physician time.  Signed, Ileene Hutchinson, PA-C 11/27/2013, 7:31 AM  EP Attending  Patient seen and examined. Agree with above exam, assessment and plan.  Mikle Bosworth.D.

## 2013-12-09 ENCOUNTER — Encounter: Payer: Self-pay | Admitting: Family Medicine

## 2013-12-09 ENCOUNTER — Ambulatory Visit: Payer: Medicare Other

## 2013-12-09 ENCOUNTER — Ambulatory Visit (INDEPENDENT_AMBULATORY_CARE_PROVIDER_SITE_OTHER): Payer: Medicare Other | Admitting: Family Medicine

## 2013-12-09 VITALS — BP 142/80 | HR 67 | Temp 98.2°F | Ht 60.5 in | Wt 197.2 lb

## 2013-12-09 DIAGNOSIS — J019 Acute sinusitis, unspecified: Secondary | ICD-10-CM | POA: Diagnosis not present

## 2013-12-09 DIAGNOSIS — E538 Deficiency of other specified B group vitamins: Secondary | ICD-10-CM

## 2013-12-09 MED ORDER — CYANOCOBALAMIN 1000 MCG/ML IJ SOLN
1000.0000 ug | Freq: Once | INTRAMUSCULAR | Status: AC
  Administered 2013-12-09: 1000 ug via INTRAMUSCULAR

## 2013-12-09 MED ORDER — AMOXICILLIN 500 MG PO CAPS: 1000.0000 mg | ORAL_CAPSULE | Freq: Two times a day (BID) | ORAL | Status: DC

## 2013-12-09 NOTE — Progress Notes (Signed)
Pre-visit discussion using our clinic review tool. No additional management support is needed unless otherwise documented below in the visit note.  

## 2013-12-09 NOTE — Assessment & Plan Note (Signed)
>   10 days... Compete amox x 10 days. Mucolytic and nasal saline.

## 2013-12-09 NOTE — Patient Instructions (Addendum)
Use mucinex Plain to break up the mucus, nasal saline spray 2-3 times a day to flush out sinuses. Compelte antibiotics. Call if not improving as expected.

## 2013-12-09 NOTE — Progress Notes (Signed)
   Subjective:    Patient ID: Caleb Bryant, male    DOB: 1934/03/06, 80 y.o.   MRN: [id number]  Sinusitis This is a new problem. The current episode started 1 to 4 weeks ago. The problem has been gradually worsening since onset. There has been no fever. The pain is moderate. Associated symptoms include congestion, headaches and sinus pressure. Pertinent negatives include no chills, coughing, diaphoresis, ear pain, neck pain, shortness of breath or sore throat. Past treatments include acetaminophen. The treatment provided mild relief.    Recent cath.. Found to have atrial flutter.  now s/p ablation. Cath negative  Changed to coreg.. Stopped metoprolol and HCTZ.  He wonders if he should restart. BP Readings from Last 3 Encounters:  12/09/13 142/80  11/27/13 98/57  11/27/13 98/57   Anti coag: with eliqus. May come off this.  Review of Systems  Constitutional: Negative for chills and diaphoresis.  HENT: Positive for congestion and sinus pressure. Negative for ear pain and sore throat.   Respiratory: Negative for cough and shortness of breath.   Musculoskeletal: Negative for neck pain.  Neurological: Positive for headaches.       Objective:   Physical Exam  Constitutional: Vital signs are normal. He appears well-developed and well-nourished.  HENT:  Head: Normocephalic.  Right Ear: Hearing normal. No middle ear effusion.  Left Ear: Hearing normal.  No middle ear effusion.  Nose: Right sinus exhibits maxillary sinus tenderness and frontal sinus tenderness. Left sinus exhibits maxillary sinus tenderness and frontal sinus tenderness.  Mouth/Throat: Oropharynx is clear and moist and mucous membranes are normal. No posterior oropharyngeal edema or posterior oropharyngeal erythema.  Neck: Trachea normal. Carotid bruit is not present. No mass and no thyromegaly present.  Cardiovascular: Normal rate, regular rhythm and normal pulses.  Exam reveals no gallop, no distant heart sounds and no  friction rub.   No murmur heard. No peripheral edema  Pulmonary/Chest: Effort normal and breath sounds normal. No respiratory distress.  Skin: Skin is warm, dry and intact. No rash noted.  Psychiatric: He has a normal mood and affect. His speech is normal and behavior is normal. Thought content normal.          Assessment & Plan:

## 2013-12-10 ENCOUNTER — Telehealth: Payer: Self-pay

## 2013-12-10 NOTE — Telephone Encounter (Signed)
Mrs Blowe said pt was seen 12/09/13 and pt forgot to mention for last 2-3 weeks pt having redness and pain in big toe (pts wife not sure which foot; pt is not at home)pt has hx of gout and request gout med sent to [facility]. Pt does not want to come in for another appt. Mrs Hidalgo request cb.(Pts wife understands Dr Diona Browner will be back in office on 12/11/13).

## 2013-12-11 MED ORDER — COLCHICINE 0.6 MG PO TABS: ORAL_TABLET | ORAL | Status: DC

## 2013-12-11 NOTE — Telephone Encounter (Signed)
[name] (wife) notified as instructed by telephone.

## 2013-12-11 NOTE — Telephone Encounter (Signed)
Noted. Will send in acute med for gout.. Colcrys.  if pain not improving he needs to come in for recheck.

## 2013-12-23 ENCOUNTER — Ambulatory Visit: Payer: Medicare Other | Admitting: Internal Medicine

## 2013-12-23 ENCOUNTER — Encounter: Payer: Self-pay | Admitting: Internal Medicine

## 2013-12-23 ENCOUNTER — Ambulatory Visit (INDEPENDENT_AMBULATORY_CARE_PROVIDER_SITE_OTHER): Payer: Medicare Other | Admitting: Internal Medicine

## 2013-12-23 VITALS — BP 128/82 | HR 62 | Ht 60.0 in | Wt 199.0 lb

## 2013-12-23 DIAGNOSIS — I200 Unstable angina: Secondary | ICD-10-CM | POA: Diagnosis not present

## 2013-12-23 DIAGNOSIS — I509 Heart failure, unspecified: Secondary | ICD-10-CM | POA: Diagnosis not present

## 2013-12-23 DIAGNOSIS — I5022 Chronic systolic (congestive) heart failure: Secondary | ICD-10-CM

## 2013-12-23 DIAGNOSIS — I4892 Unspecified atrial flutter: Secondary | ICD-10-CM | POA: Diagnosis not present

## 2013-12-23 NOTE — Assessment & Plan Note (Signed)
His EF was 30% by echo in atrial flutter. I hope that his pumping function will improve. He will return to our office in [place] for repeat echo in about 6 weeks. He will continue his beta blocker for now.

## 2013-12-23 NOTE — Assessment & Plan Note (Signed)
He is maintaining NSR very nicely. He is instructed to stop Eliquis.

## 2013-12-23 NOTE — Progress Notes (Signed)
HPI Caleb Bryant returns today for followup. He is a pleasant 80 yo man with a h/o atrial flutter and a non-ischemic CM, questionable tachycardia mediated who underwent EP study and catheter ablation of typical atrial flutter several weeks ago. He has done well in the interim. He denies chest pain or sob. He does have a hernia which he is considering having fixed. He has had a h/o abdominal surgery in the past. He has not had syncope. He has been on low dose beta blocker therapy since developing atrial flutter.  Allergies  Allergen Reactions  . Codeine Nausea And Vomiting     Current Outpatient Prescriptions  Medication Sig Dispense Refill  . acetaminophen (TYLENOL) 500 MG tablet Take 1,000 mg by mouth daily as needed (pain).      Marland Kitchen apixaban (ELIQUIS) 5 MG TABS tablet Take 1 tablet (5 mg total) by mouth 2 (two) times daily.  60 tablet  4  . carvedilol (COREG) 3.125 MG tablet Take 1 tablet (3.125 mg total) by mouth 2 (two) times daily with a meal.  60 tablet  4  . cyanocobalamin (,VITAMIN B-12,) 1000 MCG/ML injection Inject 1,000 mcg into the muscle every 30 (thirty) days. Last injection 1st week of January      . naproxen sodium (ALEVE) 220 MG tablet Take 440 mg by mouth 2 (two) times daily as needed (pain).      . prednisoLONE acetate (PRED FORTE) 1 % ophthalmic suspension Place 1 drop into the right eye 4 (four) times daily.       No current facility-administered medications for this visit.     Past Medical History  Diagnosis Date  . Colon, diverticulosis   . PVD (peripheral vascular disease)   . Benign prostatic hypertrophy   . Hypertension   . Hyperlipidemia   . Atrial flutter     "just dx'd today" (11/04/2013)  . Exertional shortness of breath   . Borderline diabetes   . Osteoarthritis     "all over" (11/04/2013)  . Gout   . Anxiety     ROS:   All systems reviewed and negative except as noted in the HPI.   Past Surgical History  Procedure Laterality Date  .  Partial colectomy  1997    "diverticulitis; busted; Byrnett"  . Appendectomy  1997    "days before 1st colon OR"  . Tonsillectomy and adenoidectomy      child  . Ankle fracture surgery Right   . Colonoscopy with esophagogastroduodenoscopy (egd)  12/00    gastic polyp   . Stress cardiolite negative  9/02    EF 60%  . Cholecystectomy  12/03  . Colonoscopy with esophagogastroduodenoscopy (egd)  11/07    gastritis, 2 gastic polyps  . Ventral hernia repair  11/07    Byrnett  . Colostomy  1997  . Colostomy reversal      "think I wore it 4 months" (11/04/2013)  . Cataract extraction, bilateral Bilateral   . Corneal transplant Right   . Colon surgery      "diverticulitis"  . Inguinal hernia repair Left   . Hernia repair       Family History  Problem Relation Age of Onset  . Coronary artery disease Sister   . Heart failure Mother      History   Social History  . Marital Status: Married    Spouse Name: N/A    Number of Children: 3  . Years of Education: N/A   Occupational  History  .     Social History Main Topics  . Smoking status: Former Smoker -- 3.00 packs/day for 30 years    Types: Cigarettes  . Smokeless tobacco: Former Systems developer    Types: Chew     Comment: 11/04/2013 "quit smoking in the 1980's or so; quit chewing before I quit smoking"  . Alcohol Use: Yes     Comment: 11/04/2013 "quit driking in 1980's; was called a weekend alcoholic"  . Drug Use: No  . Sexual Activity: No   Other Topics Concern  . Not on file   Social History Narrative   Still does lawn work.    No living will, Wife is HCPOA.     BP 128/82  Pulse 62  Ht 5' (1.524 m)  Wt 199 lb (90.266 kg)  BMI 38.86 kg/m2  Physical Exam:  Well appearing 80 yo man, NAD HEENT: Unremarkable Neck:  6 cm JVD, no thyromegally Back:  No CVA tenderness Lungs:  Clear with no wheezes HEART:  Regular rate rhythm, no murmurs, no rubs, no clicks Abd:  soft, positive bowel sounds, no organomegally, no rebound, no  guarding,multiple scars and a abdominal hernia is present Ext:  2 plus pulses, no edema, no cyanosis, no clubbing Skin:  No rashes no nodules Neuro:  CN II through XII intact, motor grossly intact  EKG - nsr with RBBB  Assess/Plan:

## 2013-12-24 ENCOUNTER — Ambulatory Visit: Payer: Medicare Other | Admitting: Cardiology

## 2013-12-31 DIAGNOSIS — L821 Other seborrheic keratosis: Secondary | ICD-10-CM | POA: Diagnosis not present

## 2013-12-31 DIAGNOSIS — L819 Disorder of pigmentation, unspecified: Secondary | ICD-10-CM | POA: Diagnosis not present

## 2013-12-31 DIAGNOSIS — L989 Disorder of the skin and subcutaneous tissue, unspecified: Secondary | ICD-10-CM | POA: Diagnosis not present

## 2013-12-31 DIAGNOSIS — L570 Actinic keratosis: Secondary | ICD-10-CM | POA: Diagnosis not present

## 2013-12-31 DIAGNOSIS — Q809 Congenital ichthyosis, unspecified: Secondary | ICD-10-CM | POA: Diagnosis not present

## 2014-01-30 ENCOUNTER — Other Ambulatory Visit: Payer: Self-pay | Admitting: *Deleted

## 2014-01-30 DIAGNOSIS — I4892 Unspecified atrial flutter: Secondary | ICD-10-CM

## 2014-02-06 ENCOUNTER — Other Ambulatory Visit: Payer: Self-pay

## 2014-02-06 ENCOUNTER — Other Ambulatory Visit (INDEPENDENT_AMBULATORY_CARE_PROVIDER_SITE_OTHER): Payer: Medicare Other

## 2014-02-06 DIAGNOSIS — R0602 Shortness of breath: Secondary | ICD-10-CM | POA: Diagnosis not present

## 2014-02-06 DIAGNOSIS — I509 Heart failure, unspecified: Secondary | ICD-10-CM | POA: Diagnosis not present

## 2014-02-06 DIAGNOSIS — I4892 Unspecified atrial flutter: Secondary | ICD-10-CM

## 2014-02-18 ENCOUNTER — Telehealth: Payer: Self-pay | Admitting: Internal Medicine

## 2014-02-18 NOTE — Telephone Encounter (Signed)
New Message:  Pt's wife is asking for clarification on her husbands meds.. Wants to know if he can get off of Carvedilol.Marland KitchenMarland Kitchen

## 2014-02-18 NOTE — Telephone Encounter (Signed)
Spoke with wife and let her know I would ask Dr Lovena Le next week and get back to her.  He did not mention in his note of stopping if EF normalized

## 2014-02-24 NOTE — Telephone Encounter (Signed)
Discussed with Dr Lovena Le okay to stop Carvedilol    Spoke with wife.  She is aware

## 2014-03-19 ENCOUNTER — Other Ambulatory Visit: Payer: Self-pay | Admitting: Family Medicine

## 2014-03-19 NOTE — Telephone Encounter (Signed)
HCTZ and metoprolol was stopped and chage to coreg at last hospitalization with Aflutter. His BP in 11/2013 hosp follow up was slightly elevated. Call pt.. What have his recent BPs been. If > 140/90. Okay to restart HCTZ. Refill as requested.

## 2014-03-19 NOTE — Telephone Encounter (Signed)
Last office visit 12/09/2013.  HCTZ not on current medication list.  Ok to refill?

## 2014-03-19 NOTE — Telephone Encounter (Signed)
Spoke with Mr. Schappell.  He states that the "heart pill" has been stopped and he was told to restart his blood pressure medication.  Restarted in about 2 weeks ago.  Has not really been checking his blood pressures at home.  Also wanted to know how often his should be getting his B12 vaccine.  I advised once a month.  Last B12 was done in 11/2013.  Appointment scheduled for tomorrow 03/20/2014 for B12 injection.  Ok to refill his HCTZ?

## 2014-03-20 ENCOUNTER — Ambulatory Visit (INDEPENDENT_AMBULATORY_CARE_PROVIDER_SITE_OTHER): Payer: Medicare Other | Admitting: *Deleted

## 2014-03-20 DIAGNOSIS — E538 Deficiency of other specified B group vitamins: Secondary | ICD-10-CM

## 2014-03-20 MED ORDER — CYANOCOBALAMIN 1000 MCG/ML IJ SOLN
1000.0000 ug | Freq: Once | INTRAMUSCULAR | Status: AC
  Administered 2014-03-20: 1000 ug via INTRAMUSCULAR

## 2014-04-23 ENCOUNTER — Ambulatory Visit (INDEPENDENT_AMBULATORY_CARE_PROVIDER_SITE_OTHER): Payer: Medicare Other | Admitting: *Deleted

## 2014-04-23 DIAGNOSIS — E538 Deficiency of other specified B group vitamins: Secondary | ICD-10-CM

## 2014-04-23 MED ORDER — CYANOCOBALAMIN 1000 MCG/ML IJ SOLN
1000.0000 ug | Freq: Once | INTRAMUSCULAR | Status: AC
  Administered 2014-04-23: 1000 ug via INTRAMUSCULAR

## 2014-04-28 DIAGNOSIS — H1010 Acute atopic conjunctivitis, unspecified eye: Secondary | ICD-10-CM | POA: Diagnosis not present

## 2014-05-13 LAB — HM DIABETES EYE EXAM

## 2014-06-12 ENCOUNTER — Telehealth: Payer: Self-pay | Admitting: Family Medicine

## 2014-06-12 DIAGNOSIS — E119 Type 2 diabetes mellitus without complications: Secondary | ICD-10-CM

## 2014-06-12 DIAGNOSIS — E538 Deficiency of other specified B group vitamins: Secondary | ICD-10-CM

## 2014-06-12 DIAGNOSIS — M1A49X0 Other secondary chronic gout, multiple sites, without tophus (tophi): Secondary | ICD-10-CM

## 2014-06-12 DIAGNOSIS — E7800 Pure hypercholesterolemia, unspecified: Secondary | ICD-10-CM

## 2014-06-12 NOTE — Telephone Encounter (Signed)
Message copied by [name] on Fri Jun 12, 2014  2:08 PM ------      Message from: Caleb Bryant      Created: Wed Jun 10, 2014 11:09 AM      Regarding: Lab orders for Monday, 8.17.15       Patient is scheduled for CPX labs, please order future labs, Thanks , [name]       ------

## 2014-06-15 ENCOUNTER — Other Ambulatory Visit (INDEPENDENT_AMBULATORY_CARE_PROVIDER_SITE_OTHER): Payer: Medicare Other

## 2014-06-15 DIAGNOSIS — E538 Deficiency of other specified B group vitamins: Secondary | ICD-10-CM

## 2014-06-15 DIAGNOSIS — M1A49X0 Other secondary chronic gout, multiple sites, without tophus (tophi): Secondary | ICD-10-CM

## 2014-06-15 DIAGNOSIS — E7800 Pure hypercholesterolemia, unspecified: Secondary | ICD-10-CM

## 2014-06-15 DIAGNOSIS — M1A00X0 Idiopathic chronic gout, unspecified site, without tophus (tophi): Secondary | ICD-10-CM | POA: Diagnosis not present

## 2014-06-15 DIAGNOSIS — E119 Type 2 diabetes mellitus without complications: Secondary | ICD-10-CM

## 2014-06-15 LAB — COMPREHENSIVE METABOLIC PANEL
ALT: 22 U/L (ref 0–53)
AST: 28 U/L (ref 0–37)
Albumin: 3.7 g/dL (ref 3.5–5.2)
Alkaline Phosphatase: 58 U/L (ref 39–117)
BILIRUBIN TOTAL: 0.7 mg/dL (ref 0.2–1.2)
BUN: 15 mg/dL (ref 6–23)
CO2: 28 mEq/L (ref 19–32)
Calcium: 9.2 mg/dL (ref 8.4–10.5)
Chloride: 102 mEq/L (ref 96–112)
Creatinine, Ser: 1.1 mg/dL (ref 0.4–1.5)
GFR: 68.01 mL/min (ref >60.00)
GLUCOSE: 104 mg/dL — AB (ref 70–99)
Potassium: 4.5 mEq/L (ref 3.5–5.1)
Sodium: 141 mEq/L (ref 135–145)
Total Protein: 7 g/dL (ref 6.0–8.3)

## 2014-06-15 LAB — URIC ACID: Uric Acid, Serum: 7.3 mg/dL (ref 4.0–7.8)

## 2014-06-15 LAB — LIPID PANEL
CHOLESTEROL: 153 mg/dL (ref 0–200)
HDL: 43 mg/dL (ref >39.00)
LDL Cholesterol: 89 mg/dL (ref 0–99)
NonHDL: 110
TRIGLYCERIDES: 106 mg/dL (ref 0.0–149.0)
Total CHOL/HDL Ratio: 4
VLDL: 21.2 mg/dL (ref 0.0–40.0)

## 2014-06-15 LAB — HEMOGLOBIN A1C: Hgb A1c MFr Bld: 6.9 % — ABNORMAL HIGH (ref 4.6–6.5)

## 2014-06-15 LAB — VITAMIN B12: Vitamin B-12: 158 pg/mL — ABNORMAL LOW (ref 211–911)

## 2014-06-19 ENCOUNTER — Encounter: Payer: Self-pay | Admitting: Cardiovascular Disease

## 2014-06-19 ENCOUNTER — Ambulatory Visit (INDEPENDENT_AMBULATORY_CARE_PROVIDER_SITE_OTHER): Payer: Medicare Other | Admitting: Cardiovascular Disease

## 2014-06-19 VITALS — BP 138/82 | HR 74 | Ht 66.0 in | Wt 195.2 lb

## 2014-06-19 DIAGNOSIS — I200 Unstable angina: Secondary | ICD-10-CM | POA: Diagnosis not present

## 2014-06-19 DIAGNOSIS — I483 Typical atrial flutter: Secondary | ICD-10-CM

## 2014-06-19 DIAGNOSIS — R079 Chest pain, unspecified: Secondary | ICD-10-CM

## 2014-06-19 DIAGNOSIS — I4892 Unspecified atrial flutter: Secondary | ICD-10-CM | POA: Diagnosis not present

## 2014-06-19 DIAGNOSIS — I10 Essential (primary) hypertension: Secondary | ICD-10-CM | POA: Diagnosis not present

## 2014-06-19 MED ORDER — PANTOPRAZOLE SODIUM 40 MG PO TBEC: 40.0000 mg | DELAYED_RELEASE_TABLET | Freq: Every day | ORAL | Status: DC

## 2014-06-19 NOTE — Assessment & Plan Note (Signed)
He underwent successful ablation with no complications. No recurrent symptoms.

## 2014-06-19 NOTE — Assessment & Plan Note (Signed)
Cardiac catheterization in January showed no significant coronary artery disease. Current chest pain seems to be due to GERD. I added Protonix 40 mg once daily. I advised him to followup with his primary care physician if symptoms do not improve.

## 2014-06-19 NOTE — Patient Instructions (Addendum)
Your physician has recommended you make the following change in your medication:  Start Protonix 40 mg once daily   Your physician wants you to follow-up in: 6 months with Dr. Fletcher Anon. You will receive a reminder letter in the mail two months in advance. If you don't receive a letter, please call our office to schedule the follow-up appointment.

## 2014-06-19 NOTE — Assessment & Plan Note (Signed)
Blood pressure is controlled on current medications. 

## 2014-06-19 NOTE — Progress Notes (Signed)
Primary care physician: Dr. Diona Browner  HPI  This is a pleasant 80 year old man who is here today for a followup visit regarding atrial flutter status post ablation. He has known history of type 2 diabetes not on medications, previous tobacco use, hypertension and hyperlipidemia. He was seen in January of this year with shortness of breath and chest pain. He was found to be in atrial flutter with 2-1 AV block. He was admitted. I proceeded with cardiac catheterization which showed minor irregularities with no evidence of obstructive he has been disease. He underwent catheter ablation by Dr. Lovena Le in February with no complications.  He has been doing well and denies any recurrent palpitations. No shortness of breath. He does complain of intermittent chest pain mostly after he spicy food. He does have frequent heartburns requiring antacids. He did have previous cholecystectomy.   Allergies  Allergen Reactions  . Codeine Nausea And Vomiting     Current Outpatient Prescriptions on File Prior to Visit  Medication Sig Dispense Refill  . acetaminophen (TYLENOL) 500 MG tablet Take 1,000 mg by mouth daily as needed (pain).      . cyanocobalamin (,VITAMIN B-12,) 1000 MCG/ML injection Inject 1,000 mcg into the muscle every 30 (thirty) days. Last injection 1st week of January      . hydrochlorothiazide (HYDRODIURIL) 25 MG tablet TAKE ONE TABLET BY MOUTH EVERY DAY  90 tablet  0  . naproxen sodium (ALEVE) 220 MG tablet Take 440 mg by mouth 2 (two) times daily as needed (pain).       No current facility-administered medications on file prior to visit.     Past Medical History  Diagnosis Date  . Colon, diverticulosis   . PVD (peripheral vascular disease)   . Benign prostatic hypertrophy   . Hypertension   . Hyperlipidemia   . Atrial flutter     "just dx'd today" (11/04/2013)  . Exertional shortness of breath   . Borderline diabetes   . Osteoarthritis     "all over" (11/04/2013)  . Gout   . Anxiety        Past Surgical History  Procedure Laterality Date  . Partial colectomy  1997    "diverticulitis; busted; Byrnett"  . Appendectomy  1997    "days before 1st colon OR"  . Tonsillectomy and adenoidectomy      child  . Ankle fracture surgery Right   . Colonoscopy with esophagogastroduodenoscopy (egd)  12/00    gastic polyp   . Stress cardiolite negative  9/02    EF 60%  . Cholecystectomy  12/03  . Colonoscopy with esophagogastroduodenoscopy (egd)  11/07    gastritis, 2 gastic polyps  . Ventral hernia repair  11/07    Byrnett  . Colostomy  1997  . Colostomy reversal      "think I wore it 4 months" (11/04/2013)  . Cataract extraction, bilateral Bilateral   . Corneal transplant Right   . Colon surgery      "diverticulitis"  . Inguinal hernia repair Left   . Hernia repair       Family History  Problem Relation Age of Onset  . Coronary artery disease Sister   . Heart failure Mother      History   Social History  . Marital Status: Married    Spouse Name: N/A    Number of Children: 3  . Years of Education: N/A   Occupational History  .     Social History Main Topics  . Smoking status: Former  Smoker -- 3.00 packs/day for 30 years    Types: Cigarettes  . Smokeless tobacco: Former Systems developer    Types: Chew     Comment: 11/04/2013 "quit smoking in the 1980's or so; quit chewing before I quit smoking"  . Alcohol Use: Yes     Comment: 11/04/2013 "quit driking in 1980's; was called a weekend alcoholic"  . Drug Use: No  . Sexual Activity: No   Other Topics Concern  . Not on file   Social History Narrative   Still does lawn work.    No living will, Wife is HCPOA.     ROS A 10 point review of system was performed. It is negative other than that mentioned in the history of present illness.   PHYSICAL EXAM   BP 138/82  Pulse 74  Ht 5' 6" (1.676 m)  Wt 195 lb 4 oz (88.565 kg)  BMI 31.53 kg/m2 Constitutional: He is oriented to person, place, and time. He appears  well-developed and well-nourished. No distress.  HENT: No nasal discharge.  Head: Normocephalic and atraumatic.  Eyes: Pupils are equal and round.  No discharge. Neck: Normal range of motion. Neck supple. No JVD present. No thyromegaly present.  Cardiovascular: Normal rate, regular rhythm, normal heart sounds. Exam reveals no gallop and no friction rub. No murmur heard.  Pulmonary/Chest: Effort normal and breath sounds normal. No stridor. No respiratory distress. He has no wheezes. He has no rales. He exhibits no tenderness.  Abdominal: Soft. Bowel sounds are normal. He exhibits no distension. There is no tenderness. There is no rebound and no guarding.  Musculoskeletal: Normal range of motion. He exhibits no edema and no tenderness.  Neurological: He is alert and oriented to person, place, and time. Coordination normal.  Skin: Skin is warm and dry. No rash noted. He is not diaphoretic. No erythema. No pallor.  Psychiatric: He has a normal mood and affect. His behavior is normal. Judgment and thought content normal.       EKG: Sinus  Rhythm  -Right bundle branch block with left axis -bifascicular block.   ABNORMAL     ASSESSMENT AND PLAN

## 2014-06-22 ENCOUNTER — Telehealth: Payer: Self-pay | Admitting: Family Medicine

## 2014-06-22 ENCOUNTER — Ambulatory Visit (INDEPENDENT_AMBULATORY_CARE_PROVIDER_SITE_OTHER): Payer: Medicare Other | Admitting: Family Medicine

## 2014-06-22 ENCOUNTER — Encounter: Payer: Self-pay | Admitting: Family Medicine

## 2014-06-22 VITALS — BP 124/68 | HR 72 | Temp 98.1°F | Ht 65.0 in | Wt 195.5 lb

## 2014-06-22 DIAGNOSIS — Z23 Encounter for immunization: Secondary | ICD-10-CM | POA: Diagnosis not present

## 2014-06-22 DIAGNOSIS — E119 Type 2 diabetes mellitus without complications: Secondary | ICD-10-CM

## 2014-06-22 DIAGNOSIS — F03A0 Unspecified dementia, mild, without behavioral disturbance, psychotic disturbance, mood disturbance, and anxiety: Secondary | ICD-10-CM

## 2014-06-22 DIAGNOSIS — F0390 Unspecified dementia without behavioral disturbance: Secondary | ICD-10-CM

## 2014-06-22 DIAGNOSIS — E538 Deficiency of other specified B group vitamins: Secondary | ICD-10-CM | POA: Diagnosis not present

## 2014-06-22 DIAGNOSIS — Z0000 Encounter for general adult medical examination without abnormal findings: Secondary | ICD-10-CM | POA: Diagnosis not present

## 2014-06-22 DIAGNOSIS — M1A00X0 Idiopathic chronic gout, unspecified site, without tophus (tophi): Secondary | ICD-10-CM

## 2014-06-22 DIAGNOSIS — M1A40X0 Other secondary chronic gout, unspecified site, without tophus (tophi): Secondary | ICD-10-CM

## 2014-06-22 DIAGNOSIS — I10 Essential (primary) hypertension: Secondary | ICD-10-CM

## 2014-06-22 DIAGNOSIS — E7800 Pure hypercholesterolemia, unspecified: Secondary | ICD-10-CM

## 2014-06-22 LAB — HM DIABETES FOOT EXAM

## 2014-06-22 MED ORDER — HYDROCHLOROTHIAZIDE 25 MG PO TABS: ORAL_TABLET | ORAL | Status: DC

## 2014-06-22 MED ORDER — CYANOCOBALAMIN 1000 MCG/ML IJ SOLN
1000.0000 ug | Freq: Once | INTRAMUSCULAR | Status: AC
  Administered 2014-06-22: 1000 ug via INTRAMUSCULAR

## 2014-06-22 NOTE — Assessment & Plan Note (Signed)
Slightly worsened control, will work on increasing exercise and decrease carbs in past.

## 2014-06-22 NOTE — Telephone Encounter (Signed)
Relevant patient education assigned to patient using Emmi. ° °

## 2014-06-22 NOTE — Assessment & Plan Note (Signed)
Stable, refuses aricept.

## 2014-06-22 NOTE — Progress Notes (Signed)
Pre visit review using our clinic review tool, if applicable. No additional management support is needed unless otherwise documented below in the visit note.

## 2014-06-22 NOTE — Assessment & Plan Note (Signed)
Well controlled with lifestyle changes.

## 2014-06-22 NOTE — Assessment & Plan Note (Signed)
Stable control with diet despite HCTZ. Pt does not wish to change HCTZ.

## 2014-06-22 NOTE — Assessment & Plan Note (Signed)
Well controlled. Continue current medication.  

## 2014-06-22 NOTE — Progress Notes (Signed)
HPI  I have personally reviewed the Medicare Annual Wellness questionnaire and have noted  1. The patient's medical and social history  2. Their use of alcohol, tobacco or illicit drugs  3. Their current medications and supplements  4. The patient's functional ability including ADL's, fall risks, home safety risks and hearing or visual  impairment.  5. Diet and physical activities  6. Evidence for depression or mood disorders  The patients weight, height, BMI and visual acuity have been recorded in the chart  I have made referrals, counseling and provided education to the patient based review of the above and I have provided the pt with a written personalized care plan for preventive services.   Aflutter with RVR: followed by Dr., Fletcher Anon ( last OV 05/2014) and Dr. Lovena Le. S/P  catheter ablation. 10/2013 : cardiac catheterization showed minor irregularities with no evidence of obstructive  disease   Hypertension: Well controlled on HCTZ  BP Readings from Last 3 Encounters:  06/22/14 124/68  06/19/14 138/82  12/23/13 128/82  Using medication without problems or lightheadedness:  Chest pain :  Has been started on protonix for possibleGERD. Edema:None  Short of breath:None  Average home BPs:  Stable at MD visit Other issues:   Diabetes:diet controlled  Lab Results  Component Value Date   HGBA1C 6.9* 06/15/2014  Eye exam: cataract surgery.. Now needs corneal transplant R >L   B12 def: Has not been coming in regularly for B12 injections. Levels remain very low. Lab Results  Component Value Date   VITAMINB12 158* 06/15/2014     Elevated Cholesterol: At goal LDL <100.  Lab Results  Component Value Date   CHOL 153 06/15/2014   HDL 43.00 06/15/2014   LDLCALC 89 06/15/2014   TRIG 106.0 06/15/2014   CHOLHDL 4 06/15/2014  Diet compliance: Poor eats a lot of sausage, [place], fish, eggs, grits, toast, fatback! He is trying to improve in last 6 months.  Exercise:with outdoor work  Other  complaints:   Gout: Last uric acid:7.3 on no preventative. Drinking black cherry juice.  Last gout flare: none in last few years.   Watery and itcy eyes, no redness: Given rx from optomitrist did not help much.  He is trying to get in with his opthalmologic.    Review of Systems  Constitutional: Negative for fever and unexpected weight change.  HENT: Negative for ear pain, congestion, sore throat, rhinorrhea, trouble swallowing and postnasal drip.  Eyes: Negative for pain.  Respiratory: Negative for cough, shortness of breath and wheezing.  Cardiovascular: Negative for chest pain, palpitations and leg swelling.  Gastrointestinal: Negative for nausea, abdominal pain, diarrhea, constipation and blood in stool.  Genitourinary: Negative for dysuria, urgency, hematuria, discharge, penile swelling, scrotal swelling, difficulty urinating, penile pain and testicular pain.  Musculoskeletal: Positive for back pain and arthralgias. Negative for myalgias, joint swelling and gait problem.  Skin: Negative for rash.  Neurological: Negative for syncope, weakness, light-headedness, numbness and headaches.  Psychiatric/Behavioral: Negative for behavioral problems and dysphoric mood. The patient is not nervous/anxious.  Objective:   Physical Exam  Constitutional: Vital signs are normal. He appears well-developed and well-nourished.  HENT:  Head: Normocephalic.  Right Ear: Hearing normal.  Left Ear: Hearing normal.  Nose: Nose normal.  Mouth/Throat: Oropharynx is clear and moist and mucous membranes are normal.  Neck: Trachea normal. Carotid bruit is not present. No mass and no thyromegaly present.  Cardiovascular: Normal rate, regular rhythm and normal pulses. Exam reveals no gallop, no distant heart sounds  and no friction rub.  No murmur heard. No peripheral edema  Pulmonary/Chest: Effort normal and breath sounds normal. No respiratory distress.  Musculoskeletal:  Skin: Skin is warm, dry and  intact. No rash noted.  Psychiatric: He has a normal mood and affect. His speech is normal and behavior is normal. Thought content normal.  Assessment & Plan:   AMW: The patient's preventative maintenance and recommended screening tests for an annual wellness exam were reviewed in full today.  Brought up to date unless services declined.  Counselled on the importance of diet, exercise, and its role in overall health and mortality.  The patient's FH and SH was reviewed, including their home life, tobacco status, and drug and alcohol status.   Vaccines: Uptodate with Td, PNA,Flu, refuses shingles vaccine.  Consider prevnar Colon: Ifob pos 2012, had nml egd and colonoscopy 12/2011, repeat in 10 years  Prostate:Not indicated at this age.  Former smoker, remote, no respiratory symptoms.

## 2014-06-22 NOTE — Assessment & Plan Note (Signed)
Pt unable to remember to come in for q 2 week ]shots or monthly shots.  Will start on oral OTC B12 after today's B12 shot.

## 2014-06-22 NOTE — Patient Instructions (Addendum)
Start oral B12 1000 mcg daily.  Cut out sugar in coffee and stop sweet tea.  I did not see any source of eye watering. Can try OTC allergy eye drops. Keep appt for  Eye MD  Visit.

## 2014-07-20 DIAGNOSIS — L821 Other seborrheic keratosis: Secondary | ICD-10-CM | POA: Diagnosis not present

## 2014-07-20 DIAGNOSIS — L919 Hypertrophic disorder of the skin, unspecified: Secondary | ICD-10-CM | POA: Diagnosis not present

## 2014-07-20 DIAGNOSIS — L819 Disorder of pigmentation, unspecified: Secondary | ICD-10-CM | POA: Diagnosis not present

## 2014-07-20 DIAGNOSIS — L570 Actinic keratosis: Secondary | ICD-10-CM | POA: Diagnosis not present

## 2014-07-20 DIAGNOSIS — L909 Atrophic disorder of skin, unspecified: Secondary | ICD-10-CM | POA: Diagnosis not present

## 2014-07-24 DIAGNOSIS — H04209 Unspecified epiphora, unspecified lacrimal gland: Secondary | ICD-10-CM | POA: Diagnosis not present

## 2014-08-17 DIAGNOSIS — M542 Cervicalgia: Secondary | ICD-10-CM | POA: Diagnosis not present

## 2014-08-17 DIAGNOSIS — M62838 Other muscle spasm: Secondary | ICD-10-CM | POA: Diagnosis not present

## 2014-08-17 DIAGNOSIS — M5032 Other cervical disc degeneration, mid-cervical region: Secondary | ICD-10-CM | POA: Diagnosis not present

## 2014-08-20 ENCOUNTER — Telehealth: Payer: Self-pay

## 2014-08-20 ENCOUNTER — Telehealth: Payer: Self-pay | Admitting: Internal Medicine

## 2014-08-20 NOTE — Telephone Encounter (Signed)
Spoke to [place] and informed him to call [employer] and have them send request to [place]. Pt verbalized understanding.

## 2014-08-20 NOTE — Telephone Encounter (Signed)
New Message  Pt wife called reports their Insurance company will need additional information on the procedure requested for this pt. They will not give the pt more insurance until they recieve more info. Please call pts wife to discuss further

## 2014-08-20 NOTE — Telephone Encounter (Signed)
Spoke with the patients wife and let her know as long as they have signed a release for the insurance company the company can send it here via fax and request the records

## 2014-08-20 NOTE — Telephone Encounter (Signed)
Mrs Storey left v/m; pt's wife request any information from last visit requested by [employer].(did not leave name of ins co) Can be given to ins co. Pt applying for life ins.

## 2014-10-01 ENCOUNTER — Encounter: Payer: Self-pay | Admitting: General Surgery

## 2014-10-01 ENCOUNTER — Ambulatory Visit (INDEPENDENT_AMBULATORY_CARE_PROVIDER_SITE_OTHER): Payer: Medicare Other | Admitting: General Surgery

## 2014-10-01 VITALS — BP 130/72 | HR 70 | Resp 14 | Ht 65.0 in | Wt 191.0 lb

## 2014-10-01 DIAGNOSIS — K439 Ventral hernia without obstruction or gangrene: Secondary | ICD-10-CM

## 2014-10-01 DIAGNOSIS — I200 Unstable angina: Secondary | ICD-10-CM

## 2014-10-01 NOTE — Patient Instructions (Addendum)
Laparoscopic Ventral Hernia Repair Laparoscopic ventral hernia repairis a surgery to fix a ventral hernia. Aventral hernia, also called an incisional hernia, is a bulge of body tissue or intestines that pushes through the front part of the abdomen. This can happen if the connective tissue covering the muscles over the abdomen has a weak spot or is torn because of a surgical cut (incision) from a previous surgery. Laparoscopic ventral hernia repair is often done soon after diagnosis to stop the hernia from getting bigger, becoming uncomfortable, or becoming an emergency. This surgery usually takes about 2 hours, but the time can vary greatly. LET [facility] CARE PROVIDER KNOW ABOUT:  Any allergies you have.  All medicines you are taking, including steroids, vitamins, herbs, eye drops, creams, and over-the-counter medicines.  Previous problems you or members of your family have had with the use of anesthetics.  Any blood disorders you have.  Previous surgeries you have had.  Medical conditions you have. RISKS AND COMPLICATIONS  Generally, laparoscopic ventral hernia repair is a safe procedure. However, as with any surgical procedure, problems can occur. Possible problems include:  Bleeding.  Trouble passing urine or having a bowel movement after the surgery.  Infection.  Pneumonia.  Blood clots.  Pain in the area of the hernia.  A bulge in the area of the hernia that may be caused by a collection of fluid.  Injury to intestines or other structures in the abdomen.  Return of the hernia after surgery. In some cases, your health care provider may need to stop the laparoscopic procedure and do regular, open surgery. This may be necessary for very difficult hernias, when organs are hard to see, or when bleeding problems occur during surgery. BEFORE THE PROCEDURE   You may need to have blood tests, urine tests, a chest X-ray, or an electrocardiogram done before the day of the  surgery.  Ask your health care provider about changing or stopping your regular medicines. This is especially important if you are taking diabetes medicines or blood thinners.  You may need to wash with a special type of germ-killing soap.  Do not eat or drink anything after midnight the night before the procedure or as directed by your health care provider.  Make plans to have someone drive you home after the procedure. PROCEDURE   Small monitors will be put on your body. They are used to check your heart, blood pressure, and oxygen level.  An IV access tube will be put into a vein in your hand or arm. Fluids and medicine will flow directly into your body through the IV tube.  You will be given medicine that makes you go to sleep (general anesthetic).  Your abdomen will be cleaned with a special soap to kill any germs on your skin.  Once you are asleep, several small incisions will be made in your abdomen.  The large space in your abdomen will be filled with air so that it expands. This gives your health care provider more room and a better view.  A thin, lighted tube with a tiny camera on the end (laparoscope) is put through a small incision in your abdomen. The camera on the laparoscope sends a picture to a TV screen in the operating room. This gives your health care provider a good view inside your abdomen.  Hollow tubes are put through the other small incisions in your abdomen. The tools needed for the procedure are put through these tubes.  Your health care provider  puts the tissue or intestines that formed the hernia back in place.  A screen-like patch (mesh) is used to close the hernia. This helps make the area stronger. Stitches, tacks, or staples are used to keep the mesh in place.  Medicine and a bandage (dressing) or skin glue will be put over the incisions. AFTER THE PROCEDURE   You will stay in a recovery area until the anesthetic wears off. Your blood pressure and  pulse will be checked often.  You may be able to go home the same day or may need to stay in the hospital for 1-2 days after surgery. Your health care provider will decide when you can go home.  You may feel some pain. You may be given medicine for pain.  You will be urged to do breathing exercises that involve taking deep breaths. This helps prevent a lung infection after a surgery.  You may have to wear compression stockings while you are in the hospital. These stockings help keep blood clots from forming in your legs. Document Released: 10/02/2012 Document Revised: 10/21/2013 Document Reviewed: 10/02/2012 [facility] Patient Information 2015 [place], [place]. This information is not intended to replace advice given to you by your health care provider. Make sure you discuss any questions you have with your health care provider.  Patient has been scheduled for a CT abdomen/pelvis with contrast at [place] for 10-06-14 at 8:30 am (arrive 8:15 am). Prep: no solids 4 hours prior but patient may have clear liquids up until exam time, pick up prep kit, and take medication list. Patient verbalizes understanding.  This patient's surgery has been scheduled for 11-05-14 at [facility].

## 2014-10-01 NOTE — Progress Notes (Signed)
Patient ID: Caleb Huaracha Sr., male   DOB: Mar 04, 1934, 80 y.o.   MRN: [id number]  Chief Complaint  Patient presents with  . Other    hernia    HPI Caleb Bohlken Blitch Sr. is a 80 y.o. male here today for a re evaluation of a abdomen hernia . Patient was seen in 2013 for this same problem. He states the area is getting bigger. No pain noticed.Patient states there is a knot that comes and goes. He had a ventral hernia repair in 2007. HPI  Past Medical History  Diagnosis Date  . Colon, diverticulosis   . PVD (peripheral vascular disease)   . Benign prostatic hypertrophy   . Hypertension   . Hyperlipidemia   . Atrial flutter     "just dx'd today" (11/04/2013)  . Exertional shortness of breath   . Borderline diabetes   . Osteoarthritis     "all over" (11/04/2013)  . Gout   . Anxiety     Past Surgical History  Procedure Laterality Date  . Partial colectomy  1997    "diverticulitis; busted; Riannah Stagner"  . Appendectomy  1997    "days before 1st colon OR"  . Tonsillectomy and adenoidectomy      child  . Ankle fracture surgery Right   . Colonoscopy with esophagogastroduodenoscopy (egd)  12/00    gastic polyp   . Stress cardiolite negative  9/02    EF 60%  . Cholecystectomy  12/03  . Colonoscopy with esophagogastroduodenoscopy (egd)  11/07,12/2011    gastritis, 2 gastic polyps  . Ventral hernia repair  11/07    Romaldo Saville  . Colostomy  1997  . Colostomy reversal      "think I wore it 4 months" (11/04/2013)  . Cataract extraction, bilateral Bilateral   . Corneal transplant Right   . Colon surgery      "diverticulitis"  . Inguinal hernia repair Left   . Hernia repair    . Albation  11/04/13    Family History  Problem Relation Age of Onset  . Coronary artery disease Sister   . Heart failure Mother     Social History History  Substance Use Topics  . Smoking status: Former Smoker -- 3.00 packs/day for 30 years    Types: Cigarettes  . Smokeless tobacco: Former Systems developer   Types: Chew     Comment: 11/04/2013 "quit smoking in the 1980's or so; quit chewing before I quit smoking"  . Alcohol Use: Yes     Comment: 11/04/2013 "quit driking in 1980's; was called a weekend alcoholic"    Allergies  Allergen Reactions  . Codeine Nausea And Vomiting    Current Outpatient Prescriptions  Medication Sig Dispense Refill  . cyanocobalamin (,VITAMIN B-12,) 1000 MCG/ML injection Inject 1,000 mcg into the muscle every 30 (thirty) days. Last injection 1st week of January    . hydrochlorothiazide (HYDRODIURIL) 25 MG tablet TAKE ONE TABLET BY MOUTH EVERY DAY 90 tablet 3  . naproxen sodium (ALEVE) 220 MG tablet Take 440 mg by mouth 2 (two) times daily as needed (pain).    . pantoprazole (PROTONIX) 40 MG tablet Take 1 tablet (40 mg total) by mouth daily. 30 tablet 6   No current facility-administered medications for this visit.    Review of Systems Review of Systems  Constitutional: Negative.   Respiratory: Negative.   Cardiovascular: Negative.     Blood pressure 130/72, pulse 70, resp. rate 14, height _0  (1.651 m), weight 191 lb (86.637 kg).  Physical  Exam Physical Exam  Constitutional: He is oriented to person, place, and time. He appears well-developed and well-nourished.  Eyes: Conjunctivae are normal. No scleral icterus.  Cardiovascular: Normal rate, regular rhythm and normal heart sounds.   Pulmonary/Chest: Effort normal and breath sounds normal.  Abdominal: Soft. Normal appearance and bowel sounds are normal. There is no hepatomegaly. There is no tenderness. A hernia is present. Hernia confirmed positive in the ventral area ( right side above umbilicus). Hernia confirmed negative in the right inguinal area and confirmed negative in the left inguinal area.    Neurological: He is alert and oriented to person, place, and time.  Skin: Skin is warm and dry.       Assessment    Enlarging ventral hernia.    Plan    Discussed ventral hernia repair. Whether  he would be a candidate for laparoscopic versus open repair will be best determined after review of abdominal CT. This will help determine if additional defects are present.     Patient has been scheduled for a CT abdomen/pelvis with contrast at [place] for 10-06-14 at 8:30 am (arrive 8:15 am). Prep: no solids 4 hours prior but patient may have clear liquids up until exam time, pick up prep kit, and take medication list. Patient verbalizes understanding.  This patient's surgery has been scheduled for 11-05-14 at [facility].   PCP:  Caren Griffins 10/02/2014, 8:35 PM

## 2014-10-02 DIAGNOSIS — K439 Ventral hernia without obstruction or gangrene: Secondary | ICD-10-CM | POA: Insufficient documentation

## 2014-10-06 ENCOUNTER — Encounter: Payer: Self-pay | Admitting: General Surgery

## 2014-10-06 ENCOUNTER — Ambulatory Visit: Payer: Self-pay | Admitting: General Surgery

## 2014-10-06 DIAGNOSIS — K439 Ventral hernia without obstruction or gangrene: Secondary | ICD-10-CM | POA: Diagnosis not present

## 2014-10-06 DIAGNOSIS — K5730 Diverticulosis of large intestine without perforation or abscess without bleeding: Secondary | ICD-10-CM | POA: Diagnosis not present

## 2014-10-06 DIAGNOSIS — K429 Umbilical hernia without obstruction or gangrene: Secondary | ICD-10-CM | POA: Diagnosis not present

## 2014-10-08 ENCOUNTER — Encounter (HOSPITAL_COMMUNITY): Payer: Self-pay | Admitting: Cardiovascular Disease

## 2014-10-13 ENCOUNTER — Telehealth: Payer: Self-pay | Admitting: Family Medicine

## 2014-10-13 ENCOUNTER — Ambulatory Visit (INDEPENDENT_AMBULATORY_CARE_PROVIDER_SITE_OTHER): Payer: Medicare Other | Admitting: Family Medicine

## 2014-10-13 ENCOUNTER — Encounter: Payer: Self-pay | Admitting: Family Medicine

## 2014-10-13 VITALS — BP 104/66 | HR 83 | Temp 98.7°F | Ht 64.25 in | Wt 187.5 lb

## 2014-10-13 DIAGNOSIS — R202 Paresthesia of skin: Secondary | ICD-10-CM | POA: Diagnosis not present

## 2014-10-13 DIAGNOSIS — E538 Deficiency of other specified B group vitamins: Secondary | ICD-10-CM | POA: Diagnosis not present

## 2014-10-13 DIAGNOSIS — R200 Anesthesia of skin: Secondary | ICD-10-CM | POA: Insufficient documentation

## 2014-10-13 DIAGNOSIS — I200 Unstable angina: Secondary | ICD-10-CM

## 2014-10-13 DIAGNOSIS — M10479 Other secondary gout, unspecified ankle and foot: Secondary | ICD-10-CM

## 2014-10-13 LAB — VITAMIN B12: Vitamin B-12: 295 pg/mL (ref 211–911)

## 2014-10-13 MED ORDER — PREDNISONE 20 MG PO TABS: ORAL_TABLET | ORAL | Status: DC

## 2014-10-13 MED ORDER — COLCHICINE 0.6 MG PO TABS: ORAL_TABLET | ORAL | Status: DC

## 2014-10-13 NOTE — Progress Notes (Signed)
Pre visit review using our clinic review tool, if applicable. No additional management support is needed unless otherwise documented below in the visit note.

## 2014-10-13 NOTE — Assessment & Plan Note (Signed)
Liekly contributing to voof pain. Treat with colchicine . If pain not improved consider OA as more of diagnosis.

## 2014-10-13 NOTE — Addendum Note (Signed)
Addended by: Daralene Milch C on: 10/13/2014 03:09 PM   Modules accepted: Orders

## 2014-10-13 NOTE — Patient Instructions (Signed)
Stop at lab on check vit B12 level to see if oral has gotten you in nml range. Start colchicine 2 tabs x 1 then 1 tab 2 hours later, then 1 tab daily. Call in 1 week with an update.

## 2014-10-13 NOTE — Telephone Encounter (Signed)
Patient advised.

## 2014-10-13 NOTE — Addendum Note (Signed)
Addended by: Marchia Bond on: 10/13/2014 03:21 PM   Modules accepted: Orders

## 2014-10-13 NOTE — Assessment & Plan Note (Signed)
Due for re-eval of levels on oral B12 . Trouble remembering appts for inj.

## 2014-10-13 NOTE — Telephone Encounter (Signed)
Will send in a course of prednsione for 6 days to help with gout and OA flare. Have him let me know if it is not improving.

## 2014-10-13 NOTE — Addendum Note (Signed)
Addended by: Eliezer Lofts E on: 10/13/2014 01:57 PM   Modules accepted: Orders

## 2014-10-13 NOTE — Telephone Encounter (Signed)
Caleb Bryant from [facility] called to notify that Caleb Bryant does not have rx insurance and 30 tablets of Colcrys is $190.00. He wanted to know if there was any other options.

## 2014-10-13 NOTE — Assessment & Plan Note (Signed)
Likely multifactorial... Nerve compression in right arm, ? B12 def and DM as cause in lower ext. Pt nervous about SE to Neurontin. Cost prohibitive lyrica.

## 2014-10-13 NOTE — Progress Notes (Signed)
Subjective:    Patient ID: Caleb Lightcap Sr., male    DOB: 04-06-1934, 80 y.o.   MRN: [id number]  HPI  80 year old male with hx of vit B12 def, peripheral vascular disease, mild dementia,  Osteoarthritis and gout presents with new onset of pain in Bilateral MCPs, toes are hot Tingling in B bottom of feet and ankles. Not really a burning sensation. Symptoms have been ongoing "quitw a while" cannot pin it down.    Right arm goes to sleep ( pins and needles) off and on. Shakes it and rubs it to get it to go away. Occurs most at night when in bed leaning on it.   He is on B12 daily. Last uric acid 05/2014 7.3  DM is well controlled. Lab Results  Component Value Date   HGBA1C 6.9* 06/15/2014   He has decrease carb in diet. Wt Readings from Last 3 Encounters:  10/13/14 187 lb 8 oz (85.049 kg)  10/01/14 191 lb (86.637 kg)  06/22/14 195 lb 8 oz (88.678 kg)    Was given neurontin in past for similar symprom  Review of Systems  Constitutional: Positive for fatigue. Negative for fever.  HENT: Negative for ear pain.   Eyes: Negative for pain.  Respiratory: Negative for shortness of breath and wheezing.   Cardiovascular: Negative for chest pain.  Gastrointestinal: Negative for abdominal distention.       Objective:   Physical Exam  Constitutional: Vital signs are normal. He appears well-developed and well-nourished.  HENT:  Head: Normocephalic.  Right Ear: Hearing normal.  Left Ear: Hearing normal.  Nose: Nose normal.  Mouth/Throat: Oropharynx is clear and moist and mucous membranes are normal.  Neck: Trachea normal. Carotid bruit is not present. No thyroid mass and no thyromegaly present.  Cardiovascular: Normal rate, regular rhythm and normal pulses.  Exam reveals no gallop, no distant heart sounds and no friction rub.   No murmur heard. No peripheral edema  Pulmonary/Chest: Effort normal and breath sounds normal. No respiratory distress.  Musculoskeletal:        Arms: ttp and heat in B MCP joints, no deformity , no tophi  Neurological: He has normal strength. A sensory deficit is present. He displays a negative Romberg sign. Gait normal.  Decrease sensation in B toes, nml elsewhere  neg ulnar nerve comp test and neg tinel and phlaen on right.   Skin: Skin is warm, dry and intact. No rash noted.  Psychiatric: He has a normal mood and affect. His speech is normal and behavior is normal. Thought content normal.          Assessment & Plan:

## 2014-10-24 ENCOUNTER — Other Ambulatory Visit: Payer: Self-pay | Admitting: General Surgery

## 2014-10-24 DIAGNOSIS — R0781 Pleurodynia: Secondary | ICD-10-CM | POA: Diagnosis not present

## 2014-10-24 DIAGNOSIS — K439 Ventral hernia without obstruction or gangrene: Secondary | ICD-10-CM

## 2014-10-24 DIAGNOSIS — M549 Dorsalgia, unspecified: Secondary | ICD-10-CM | POA: Diagnosis not present

## 2014-10-27 ENCOUNTER — Encounter: Payer: Self-pay | Admitting: General Surgery

## 2014-10-27 ENCOUNTER — Ambulatory Visit: Payer: Self-pay | Admitting: General Surgery

## 2014-10-27 ENCOUNTER — Ambulatory Visit (INDEPENDENT_AMBULATORY_CARE_PROVIDER_SITE_OTHER): Payer: Medicare Other | Admitting: General Surgery

## 2014-10-27 VITALS — BP 126/72 | HR 70 | Resp 14 | Ht 66.0 in | Wt 189.0 lb

## 2014-10-27 DIAGNOSIS — K439 Ventral hernia without obstruction or gangrene: Secondary | ICD-10-CM

## 2014-10-27 DIAGNOSIS — Z01812 Encounter for preprocedural laboratory examination: Secondary | ICD-10-CM | POA: Diagnosis not present

## 2014-10-27 DIAGNOSIS — Z01810 Encounter for preprocedural cardiovascular examination: Secondary | ICD-10-CM | POA: Diagnosis not present

## 2014-10-27 DIAGNOSIS — I200 Unstable angina: Secondary | ICD-10-CM

## 2014-10-27 DIAGNOSIS — Z01818 Encounter for other preprocedural examination: Secondary | ICD-10-CM | POA: Diagnosis not present

## 2014-10-27 DIAGNOSIS — I10 Essential (primary) hypertension: Secondary | ICD-10-CM | POA: Diagnosis not present

## 2014-10-27 LAB — CBC WITH DIFFERENTIAL/PLATELET
BASOS PCT: 0.5 %
Basophil #: 0 10*3/uL (ref 0.0–0.1)
Eosinophil #: 0.4 10*3/uL (ref 0.0–0.7)
Eosinophil %: 4.3 %
HCT: 51.3 % (ref 40.0–52.0)
HGB: 16.5 g/dL (ref 13.0–18.0)
Lymphocyte #: 2.1 10*3/uL (ref 1.0–3.6)
Lymphocyte %: 21.8 %
MCH: 30.4 pg (ref 26.0–34.0)
MCHC: 32.2 g/dL (ref 32.0–36.0)
MCV: 95 fL (ref 80–100)
Monocyte #: 1 x10 3/mm (ref 0.2–1.0)
Monocyte %: 10.1 %
NEUTROS ABS: 6.1 10*3/uL (ref 1.4–6.5)
NEUTROS PCT: 63.3 %
Platelet: 183 10*3/uL (ref 150–440)
RBC: 5.43 10*6/uL (ref 4.40–5.90)
RDW: 14 % (ref 11.5–14.5)
WBC: 9.7 10*3/uL (ref 3.8–10.6)

## 2014-10-27 LAB — BASIC METABOLIC PANEL
Anion Gap: 6 — ABNORMAL LOW (ref 7–16)
BUN: 17 mg/dL (ref 7–18)
CALCIUM: 9.2 mg/dL (ref 8.5–10.1)
CHLORIDE: 100 mmol/L (ref 98–107)
CO2: 33 mmol/L — AB (ref 21–32)
Creatinine: 1.23 mg/dL (ref 0.60–1.30)
EGFR (African American): >60
GLUCOSE: 100 mg/dL — AB (ref 65–99)
Osmolality: 279 (ref 275–301)
Potassium: 4.6 mmol/L (ref 3.5–5.1)
SODIUM: 139 mmol/L (ref 136–145)

## 2014-10-27 NOTE — Patient Instructions (Signed)
The patient is aware to call back for any questions or concerns.  

## 2014-10-27 NOTE — Progress Notes (Signed)
Patient ID: Caleb Alamo Sr., male   DOB: 1934-06-24, 80 y.o.   MRN: [id number]  Chief Complaint  Patient presents with  . Pre-op Exam    ventral hernia repair    HPI Caleb Trombetta Irmen Sr. is a 80 y.o. male who presents for a pre op evaluation for a ventral hernia repair scheduled for 11/05/2014. No new problems concerning this area.   The patient does report a several day history of right mid back pain which he went to the walk-in Center for evaluation. He was given Flexeril and Lodine. No chest x-ray was completed.  He reports no dysphagia, shortness of breath or cough. No trauma to the area.  HPI  Past Medical History  Diagnosis Date  . Colon, diverticulosis   . PVD (peripheral vascular disease)   . Benign prostatic hypertrophy   . Hypertension   . Hyperlipidemia   . Atrial flutter     "just dx'd today" (11/04/2013)  . Exertional shortness of breath   . Borderline diabetes   . Osteoarthritis     "all over" (11/04/2013)  . Gout   . Anxiety     Past Surgical History  Procedure Laterality Date  . Partial colectomy  1997    "diverticulitis; busted; Byrnett"  . Appendectomy  1997    "days before 1st colon OR"  . Tonsillectomy and adenoidectomy      child  . Ankle fracture surgery Right   . Colonoscopy with esophagogastroduodenoscopy (egd)  12/00    gastic polyp   . Stress cardiolite negative  9/02    EF 60%  . Cholecystectomy  12/03  . Colonoscopy with esophagogastroduodenoscopy (egd)  11/07,12/2011    gastritis, 2 gastic polyps  . Ventral hernia repair  11/07    Byrnett  . Colostomy  1997  . Colostomy reversal      "think I wore it 4 months" (11/04/2013)  . Cataract extraction, bilateral Bilateral   . Corneal transplant Right   . Colon surgery      "diverticulitis"  . Inguinal hernia repair Left   . Hernia repair    . Albation  11/04/13  . Coronary angiogram  11/05/2013    Procedure: CORONARY ANGIOGRAM;  Surgeon: Wellington Hampshire, MD;  Location: [facility] CATH LAB;   Service: Cardiovascular;;  . Atrial flutter ablation N/A 11/26/2013    Procedure: ATRIAL FLUTTER ABLATION;  Surgeon: Evans Lance, MD;  Location: [facility] CATH LAB;  Service: Cardiovascular;  Laterality: N/A;    Family History  Problem Relation Age of Onset  . Coronary artery disease Sister   . Heart failure Mother     Social History History  Substance Use Topics  . Smoking status: Former Smoker -- 3.00 packs/day for 30 years    Types: Cigarettes  . Smokeless tobacco: Former Systems developer    Types: Chew     Comment: 11/04/2013 "quit smoking in the 1980's or so; quit chewing before I quit smoking"  . Alcohol Use: Yes     Comment: 11/04/2013 "quit driking in 1980's; was called a weekend alcoholic"    Allergies  Allergen Reactions  . Codeine Nausea And Vomiting    Current Outpatient Prescriptions  Medication Sig Dispense Refill  . cyclobenzaprine (FLEXERIL) 5 MG tablet Take by mouth.    . etodolac (LODINE) 400 MG tablet Take by mouth.    . hydrochlorothiazide (HYDRODIURIL) 25 MG tablet TAKE ONE TABLET BY MOUTH EVERY DAY 90 tablet 3  . pantoprazole (PROTONIX) 40 MG tablet Take 1  tablet (40 mg total) by mouth daily. 30 tablet 6  . vitamin B-12 (CYANOCOBALAMIN) 1000 MCG tablet Take 1,000 mcg by mouth daily.     No current facility-administered medications for this visit.    Review of Systems Review of Systems  Constitutional: Negative.   Respiratory: Negative.   Cardiovascular: Negative.     Blood pressure 126/72, pulse 70, resp. rate 14, height _0  (1.676 m), weight 189 lb (85.73 kg).  Physical Exam Physical Exam  Constitutional: He is oriented to person, place, and time. He appears well-developed and well-nourished.  Cardiovascular: Normal rate, regular rhythm and normal heart sounds.   Pulmonary/Chest: Effort normal and breath sounds normal.    Abdominal:    4 cm defect on the right upper abdomen.   Neurological: He is alert and oriented to person, place, and time.  Skin:  Skin is warm and dry.    Data Reviewed 10/06/2014 CT reviewed. Single fascial defect of the right of the midline just above the umbilicus.  Assessment    Ventral hernia.  Right posterior chest rib pain, unclear etiology.    Plan    We'll plan for ventral hernia repair on January 7 under general anesthesia. We'll likely be able to complete this laparoscopically, the possibility of an open procedure was reviewed.  With this first complained of posterior chest wall pain we'll obtain a chest x-ray at pre-admit today.    PCP:  Caren Griffins 10/27/2014, 10:06 AM

## 2014-11-05 ENCOUNTER — Ambulatory Visit: Payer: Self-pay | Admitting: General Surgery

## 2014-11-05 DIAGNOSIS — M109 Gout, unspecified: Secondary | ICD-10-CM | POA: Diagnosis not present

## 2014-11-05 DIAGNOSIS — K432 Incisional hernia without obstruction or gangrene: Secondary | ICD-10-CM | POA: Diagnosis not present

## 2014-11-05 DIAGNOSIS — Z885 Allergy status to narcotic agent status: Secondary | ICD-10-CM | POA: Diagnosis not present

## 2014-11-05 DIAGNOSIS — I10 Essential (primary) hypertension: Secondary | ICD-10-CM | POA: Diagnosis not present

## 2014-11-05 DIAGNOSIS — H9193 Unspecified hearing loss, bilateral: Secondary | ICD-10-CM | POA: Diagnosis not present

## 2014-11-05 DIAGNOSIS — I2510 Atherosclerotic heart disease of native coronary artery without angina pectoris: Secondary | ICD-10-CM | POA: Diagnosis not present

## 2014-11-05 DIAGNOSIS — Z87891 Personal history of nicotine dependence: Secondary | ICD-10-CM | POA: Diagnosis not present

## 2014-11-05 DIAGNOSIS — R0600 Dyspnea, unspecified: Secondary | ICD-10-CM | POA: Diagnosis not present

## 2014-11-05 DIAGNOSIS — I739 Peripheral vascular disease, unspecified: Secondary | ICD-10-CM | POA: Diagnosis not present

## 2014-11-05 DIAGNOSIS — M1990 Unspecified osteoarthritis, unspecified site: Secondary | ICD-10-CM | POA: Diagnosis not present

## 2014-11-05 DIAGNOSIS — K219 Gastro-esophageal reflux disease without esophagitis: Secondary | ICD-10-CM | POA: Diagnosis not present

## 2014-11-05 DIAGNOSIS — Z79899 Other long term (current) drug therapy: Secondary | ICD-10-CM | POA: Diagnosis not present

## 2014-11-05 HISTORY — PX: HERNIA REPAIR: SHX51

## 2014-11-06 ENCOUNTER — Encounter: Payer: Self-pay | Admitting: General Surgery

## 2014-11-11 ENCOUNTER — Encounter: Payer: Self-pay | Admitting: General Surgery

## 2014-11-12 ENCOUNTER — Encounter: Payer: Self-pay | Admitting: General Surgery

## 2014-11-12 ENCOUNTER — Ambulatory Visit (INDEPENDENT_AMBULATORY_CARE_PROVIDER_SITE_OTHER): Payer: Self-pay | Admitting: General Surgery

## 2014-11-12 VITALS — BP 150/70 | HR 76 | Resp 14 | Ht 66.0 in | Wt 193.0 lb

## 2014-11-12 DIAGNOSIS — K439 Ventral hernia without obstruction or gangrene: Secondary | ICD-10-CM

## 2014-11-12 NOTE — Patient Instructions (Signed)
Patient to return in 1 month for follow up visit. The patient is aware to call back for any questions or concerns.

## 2014-11-12 NOTE — Progress Notes (Addendum)
Patient ID: Caleb Luna Sr., male   DOB: [birth year], 81 y.o.   MRN: [id number]  Chief Complaint  Patient presents with  . Routine Post Op    ventral hernia repair    HPI Caleb Nauert Kowalchuk Sr. is a 81 y.o. male who presents for a post op visit for a ventral hernia repair. The procedure was performed on 11/05/14. He doing good. He is still using pain medication as needed at night.   HPI  Past Medical History  Diagnosis Date  . Colon, diverticulosis   . PVD (peripheral vascular disease)   . Benign prostatic hypertrophy   . Hypertension   . Hyperlipidemia   . Atrial flutter     "just dx'd today" (11/04/2013)  . Exertional shortness of breath   . Borderline diabetes   . Osteoarthritis     "all over" (11/04/2013)  . Gout   . Anxiety     Past Surgical History  Procedure Laterality Date  . Partial colectomy  1997    "diverticulitis; busted; Byrnett"  . Appendectomy  1997    "days before 1st colon OR"  . Tonsillectomy and adenoidectomy      child  . Ankle fracture surgery Right   . Colonoscopy with esophagogastroduodenoscopy (egd)  12/00    gastic polyp   . Stress cardiolite negative  9/02    EF 60%  . Cholecystectomy  12/03  . Colonoscopy with esophagogastroduodenoscopy (egd)  11/07,12/2011    gastritis, 2 gastic polyps  . Ventral hernia repair  11/07    Byrnett  . Colostomy  1997  . Colostomy reversal      "think I wore it 4 months" (11/04/2013)  . Cataract extraction, bilateral Bilateral   . Corneal transplant Right   . Colon surgery      "diverticulitis"  . Albation  11/04/13  . Coronary angiogram  11/05/2013    Procedure: CORONARY ANGIOGRAM;  Surgeon: Wellington Hampshire, MD;  Location: [facility] CATH LAB;  Service: Cardiovascular;;  . Atrial flutter ablation N/A 11/26/2013    Procedure: ATRIAL FLUTTER ABLATION;  Surgeon: Evans Lance, MD;  Location: [facility] CATH LAB;  Service: Cardiovascular;  Laterality: N/A;  . Inguinal hernia repair Left   . Hernia repair  11/05/14    ventral  hernia    Family History  Problem Relation Age of Onset  . Coronary artery disease Sister   . Heart failure Mother     Social History History  Substance Use Topics  . Smoking status: Former Smoker -- 3.00 packs/day for 30 years    Types: Cigarettes  . Smokeless tobacco: Former Systems developer    Types: Chew     Comment: 11/04/2013 "quit smoking in the 1980's or so; quit chewing before I quit smoking"  . Alcohol Use: Yes     Comment: 11/04/2013 "quit driking in 1980's; was called a weekend alcoholic"    Allergies  Allergen Reactions  . Codeine Nausea And Vomiting    Current Outpatient Prescriptions  Medication Sig Dispense Refill  . cyclobenzaprine (FLEXERIL) 5 MG tablet Take by mouth.    . etodolac (LODINE) 400 MG tablet Take by mouth.    . hydrochlorothiazide (HYDRODIURIL) 25 MG tablet TAKE ONE TABLET BY MOUTH EVERY DAY 90 tablet 3  . oxyCODONE-acetaminophen (PERCOCET/ROXICET) 5-325 MG per tablet Take 1 tablet by mouth every 4 (four) hours as needed for severe pain.    . pantoprazole (PROTONIX) 40 MG tablet Take 1 tablet (40 mg total) by mouth daily. 30 tablet  6  . vitamin B-12 (CYANOCOBALAMIN) 1000 MCG tablet Take 1,000 mcg by mouth daily.     No current facility-administered medications for this visit.    Review of Systems Review of Systems  Constitutional: Negative.   Respiratory: Negative.   Cardiovascular: Negative.   Gastrointestinal: Negative.     Blood pressure 150/70, pulse 76, resp. rate 14, height _0  (1.676 m), weight 193 lb (87.544 kg).  Physical Exam Physical Exam  Constitutional: He is oriented to person, place, and time. He appears well-developed and well-nourished.  Cardiovascular: Normal rate, regular rhythm and normal heart sounds.   No murmur heard. Pulmonary/Chest: Effort normal and breath sounds normal.  Abdominal: Soft. Normal appearance and bowel sounds are normal.    Neurological: He is alert and oriented to person, place, and time.  Skin: Skin  is warm and dry.       Assessment    Doing well status post recurrent ventral hernia repair.     Plan    He will increase his activity, keeping weights below 20 pounds.  Follow-up examination in one month.     PCP:  Caren Griffins 11/27/2014, 7:09 AM

## 2014-11-30 ENCOUNTER — Other Ambulatory Visit: Payer: Self-pay | Admitting: Family Medicine

## 2014-11-30 DIAGNOSIS — E119 Type 2 diabetes mellitus without complications: Secondary | ICD-10-CM

## 2014-12-01 ENCOUNTER — Other Ambulatory Visit (INDEPENDENT_AMBULATORY_CARE_PROVIDER_SITE_OTHER): Payer: Medicare Other

## 2014-12-01 DIAGNOSIS — E119 Type 2 diabetes mellitus without complications: Secondary | ICD-10-CM | POA: Diagnosis not present

## 2014-12-01 LAB — MICROALBUMIN / CREATININE URINE RATIO
CREATININE, U: 141.1 mg/dL
MICROALB UR: 1 mg/dL (ref 0.0–1.9)
Microalb Creat Ratio: 0.7 mg/g (ref 0.0–30.0)

## 2014-12-01 LAB — GLUCOSE, RANDOM: GLUCOSE: 114 mg/dL — AB (ref 70–99)

## 2014-12-01 LAB — HEMOGLOBIN A1C: HEMOGLOBIN A1C: 6.7 % — AB (ref 4.6–6.5)

## 2014-12-14 ENCOUNTER — Encounter: Payer: Self-pay | Admitting: General Surgery

## 2014-12-14 ENCOUNTER — Ambulatory Visit (INDEPENDENT_AMBULATORY_CARE_PROVIDER_SITE_OTHER): Payer: Medicare Other | Admitting: General Surgery

## 2014-12-14 VITALS — BP 130/78 | HR 68 | Resp 14 | Ht 66.0 in

## 2014-12-14 DIAGNOSIS — K439 Ventral hernia without obstruction or gangrene: Secondary | ICD-10-CM

## 2014-12-14 NOTE — Progress Notes (Signed)
Patient ID: Caleb Egelston Sr., male   DOB: 1934/06/25, 81 y.o.   MRN: [id number]  Chief Complaint  Patient presents with  . Routine Post Op    Ventral Hernia repair    HPI Caleb Handshoe Bena Sr. is a 81 y.o. male who presents for a post op visit for a ventral hernia repair. The procedure was performed on 11/05/14. He doing good. He reports he is no longer using his pain medication.  Up until the last week the patient reports he had felt a bulge is original surgery site. This is no longer evident. Likely resorption of a seroma between the mesh and the overlying adipose tissue. HPI  Past Medical History  Diagnosis Date  . Colon, diverticulosis   . PVD (peripheral vascular disease)   . Benign prostatic hypertrophy   . Hypertension   . Hyperlipidemia   . Atrial flutter     "just dx'd today" (11/04/2013)  . Exertional shortness of breath   . Borderline diabetes   . Osteoarthritis     "all over" (11/04/2013)  . Gout   . Anxiety     Past Surgical History  Procedure Laterality Date  . Partial colectomy  1997    "diverticulitis; busted; Caleb Bryant"  . Appendectomy  1997    "days before 1st colon OR"  . Tonsillectomy and adenoidectomy      child  . Ankle fracture surgery Right   . Colonoscopy with esophagogastroduodenoscopy (egd)  12/00    gastic polyp   . Stress cardiolite negative  9/02    EF 60%  . Cholecystectomy  12/03  . Colonoscopy with esophagogastroduodenoscopy (egd)  11/07,12/2011    gastritis, 2 gastic polyps  . Ventral hernia repair  11/07    Caleb Bryant  . Colostomy  1997  . Colostomy reversal      "think I wore it 4 months" (11/04/2013)  . Cataract extraction, bilateral Bilateral   . Corneal transplant Right   . Colon surgery      "diverticulitis"  . Albation  11/04/13  . Coronary angiogram  11/05/2013    Procedure: CORONARY ANGIOGRAM;  Surgeon: Wellington Hampshire, MD;  Location: [facility] CATH LAB;  Service: Cardiovascular;;  . Atrial flutter ablation N/A 11/26/2013     Procedure: ATRIAL FLUTTER ABLATION;  Surgeon: Evans Lance, MD;  Location: [facility] CATH LAB;  Service: Cardiovascular;  Laterality: N/A;  . Inguinal hernia repair Left   . Hernia repair  11/05/14    ventral hernia    Family History  Problem Relation Age of Onset  . Coronary artery disease Sister   . Heart failure Mother     Social History History  Substance Use Topics  . Smoking status: Former Smoker -- 3.00 packs/day for 30 years    Types: Cigarettes  . Smokeless tobacco: Former Systems developer    Types: Chew     Comment: 11/04/2013 "quit smoking in the 1980's or so; quit chewing before I quit smoking"  . Alcohol Use: Yes     Comment: 11/04/2013 "quit driking in 1980's; was called a weekend alcoholic"    Allergies  Allergen Reactions  . Codeine Nausea And Vomiting    Current Outpatient Prescriptions  Medication Sig Dispense Refill  . cyclobenzaprine (FLEXERIL) 5 MG tablet Take by mouth.    . etodolac (LODINE) 400 MG tablet Take by mouth.    . hydrochlorothiazide (HYDRODIURIL) 25 MG tablet TAKE ONE TABLET BY MOUTH EVERY DAY 90 tablet 3  . pantoprazole (PROTONIX) 40 MG tablet Take  1 tablet (40 mg total) by mouth daily. 30 tablet 6  . vitamin B-12 (CYANOCOBALAMIN) 1000 MCG tablet Take 1,000 mcg by mouth daily.     No current facility-administered medications for this visit.    Review of Systems Review of Systems  Constitutional: Negative.   Respiratory: Negative.   Cardiovascular: Negative.     Blood pressure 130/78, pulse 68, resp. rate 14, height _0  (1.676 m).  Physical Exam Physical Exam  Constitutional: He is oriented to person, place, and time. He appears well-developed and well-nourished.  Abdominal:    Neurological: He is alert and oriented to person, place, and time.       Assessment    During well status post repair of multiple small fascial defects with intra-peritoneal mesh placement.    Plan    Plans will be for follow-up-year-old an as-needed basis.     PCP: Caren Griffins 12/14/2014, 12:32 PM

## 2014-12-21 ENCOUNTER — Ambulatory Visit (INDEPENDENT_AMBULATORY_CARE_PROVIDER_SITE_OTHER): Payer: Medicare Other | Admitting: Cardiovascular Disease

## 2014-12-21 ENCOUNTER — Encounter: Payer: Self-pay | Admitting: Cardiovascular Disease

## 2014-12-21 VITALS — BP 109/80 | HR 95 | Ht 66.0 in | Wt 189.5 lb

## 2014-12-21 DIAGNOSIS — I4892 Unspecified atrial flutter: Secondary | ICD-10-CM

## 2014-12-21 DIAGNOSIS — Z8679 Personal history of other diseases of the circulatory system: Secondary | ICD-10-CM

## 2014-12-21 DIAGNOSIS — I10 Essential (primary) hypertension: Secondary | ICD-10-CM | POA: Diagnosis not present

## 2014-12-21 NOTE — Progress Notes (Signed)
Primary care physician: Dr. Diona Browner  HPI  This is a pleasant 81 year old man who is here today for a followup visit regarding atrial flutter status post ablation. He has known history of type 2 diabetes not on medications, previous tobacco use, hypertension and hyperlipidemia. He was seen in January of 2015 with shortness of breath and chest pain. He was found to be in atrial flutter with 2-1 AV block. He was admitted. I proceeded with cardiac catheterization which showed minor irregularities with no evidence of obstructive he has been disease. He underwent catheter ablation by Dr. Lovena Le in February with no complications.  He has been doing well and denies any recurrent palpitations. No shortness of breath.  He had symptoms suggestive of GERD during last visit and was started on Protonix 40 mg once daily. He reports improved symptoms.    Allergies  Allergen Reactions  . Codeine Nausea And Vomiting     Current Outpatient Prescriptions on File Prior to Visit  Medication Sig Dispense Refill  . cyclobenzaprine (FLEXERIL) 5 MG tablet Take by mouth.    . etodolac (LODINE) 400 MG tablet Take by mouth.    . hydrochlorothiazide (HYDRODIURIL) 25 MG tablet TAKE ONE TABLET BY MOUTH EVERY DAY 90 tablet 3  . pantoprazole (PROTONIX) 40 MG tablet Take 1 tablet (40 mg total) by mouth daily. 30 tablet 6  . vitamin B-12 (CYANOCOBALAMIN) 1000 MCG tablet Take 1,000 mcg by mouth daily.     No current facility-administered medications on file prior to visit.     Past Medical History  Diagnosis Date  . Colon, diverticulosis   . PVD (peripheral vascular disease)   . Benign prostatic hypertrophy   . Hypertension   . Hyperlipidemia   . Atrial flutter     "just dx'd today" (11/04/2013)  . Exertional shortness of breath   . Borderline diabetes   . Osteoarthritis     "all over" (11/04/2013)  . Gout   . Anxiety      Past Surgical History  Procedure Laterality Date  . Partial colectomy  1997   "diverticulitis; busted; Byrnett"  . Appendectomy  1997    "days before 1st colon OR"  . Tonsillectomy and adenoidectomy      child  . Ankle fracture surgery Right   . Colonoscopy with esophagogastroduodenoscopy (egd)  12/00    gastic polyp   . Stress cardiolite negative  9/02    EF 60%  . Cholecystectomy  12/03  . Colonoscopy with esophagogastroduodenoscopy (egd)  11/07,12/2011    gastritis, 2 gastic polyps  . Ventral hernia repair  11/07    Byrnett  . Colostomy  1997  . Colostomy reversal      "think I wore it 4 months" (11/04/2013)  . Cataract extraction, bilateral Bilateral   . Corneal transplant Right   . Colon surgery      "diverticulitis"  . Albation  11/04/13  . Coronary angiogram  11/05/2013    Procedure: CORONARY ANGIOGRAM;  Surgeon: Wellington Hampshire, MD;  Location: [facility] CATH LAB;  Service: Cardiovascular;;  . Atrial flutter ablation N/A 11/26/2013    Procedure: ATRIAL FLUTTER ABLATION;  Surgeon: Evans Lance, MD;  Location: [facility] CATH LAB;  Service: Cardiovascular;  Laterality: N/A;  . Inguinal hernia repair Left   . Hernia repair  11/05/14    ventral hernia     Family History  Problem Relation Age of Onset  . Coronary artery disease Sister   . Heart failure Mother  History   Social History  . Marital Status: Married    Spouse Name: N/A  . Number of Children: 3  . Years of Education: N/A   Occupational History  .     Social History Main Topics  . Smoking status: Former Smoker -- 3.00 packs/day for 30 years    Types: Cigarettes  . Smokeless tobacco: Former Systems developer    Types: Chew     Comment: 11/04/2013 "quit smoking in the 1980's or so; quit chewing before I quit smoking"  . Alcohol Use: No     Comment: 11/04/2013 "quit driking in 1980's; was called a weekend alcoholic"  . Drug Use: No  . Sexual Activity: No   Other Topics Concern  . Not on file   Social History Narrative   Still does lawn work.    No living will, Wife is HCPOA. (Reviewed 2015)      ROS A 10 point review of system was performed. It is negative other than that mentioned in the history of present illness.   PHYSICAL EXAM   BP 109/80 mmHg  Pulse 95  Ht _0  (1.676 m)  Wt 189 lb 8 oz (85.957 kg)  BMI 30.60 kg/m2 Constitutional: He is oriented to person, place, and time. He appears well-developed and well-nourished. No distress.  HENT: No nasal discharge.  Head: Normocephalic and atraumatic.  Eyes: Pupils are equal and round.  No discharge. Neck: Normal range of motion. Neck supple. No JVD present. No thyromegaly present.  Cardiovascular: Normal rate, regular rhythm, normal heart sounds. Exam reveals no gallop and no friction rub. No murmur heard.  Pulmonary/Chest: Effort normal and breath sounds normal. No stridor. No respiratory distress. He has no wheezes. He has no rales. He exhibits no tenderness.  Abdominal: Soft. Bowel sounds are normal. He exhibits no distension. There is no tenderness. There is no rebound and no guarding.  Musculoskeletal: Normal range of motion. He exhibits no edema and no tenderness.  Neurological: He is alert and oriented to person, place, and time. Coordination normal.  Skin: Skin is warm and dry. No rash noted. He is not diaphoretic. No erythema. No pallor.  Psychiatric: He has a normal mood and affect. His behavior is normal. Judgment and thought content normal.       EKG: Sinus  Rhythm  -Right bundle branch block with left axis -bifascicular block.   ABNORMAL     ASSESSMENT AND PLAN

## 2014-12-21 NOTE — Patient Instructions (Signed)
Continue same medications.   Your physician wants you to follow-up in: 1 year.  You will receive a reminder letter in the mail two months in advance. If you don't receive a letter, please call our office to schedule the follow-up appointment.

## 2014-12-24 NOTE — Assessment & Plan Note (Signed)
Blood pressure is controlled on current medications.

## 2014-12-24 NOTE — Assessment & Plan Note (Signed)
Status post ablation. He is maintaining in sinus rhythm with no evidence of recurrent arrhythmia. Continue observation. He does have bifascicular block.

## 2014-12-28 DIAGNOSIS — L570 Actinic keratosis: Secondary | ICD-10-CM | POA: Diagnosis not present

## 2014-12-28 DIAGNOSIS — L820 Inflamed seborrheic keratosis: Secondary | ICD-10-CM | POA: Diagnosis not present

## 2015-01-28 ENCOUNTER — Ambulatory Visit: Payer: PRIVATE HEALTH INSURANCE | Admitting: Family Medicine

## 2015-01-29 ENCOUNTER — Encounter: Payer: Self-pay | Admitting: Family Medicine

## 2015-01-29 ENCOUNTER — Telehealth: Payer: Self-pay | Admitting: *Deleted

## 2015-01-29 ENCOUNTER — Ambulatory Visit (INDEPENDENT_AMBULATORY_CARE_PROVIDER_SITE_OTHER): Payer: Medicare Other | Admitting: Family Medicine

## 2015-01-29 VITALS — BP 120/74 | HR 71 | Temp 97.9°F | Resp 16 | Ht 66.0 in | Wt 190.0 lb

## 2015-01-29 DIAGNOSIS — R200 Anesthesia of skin: Secondary | ICD-10-CM

## 2015-01-29 DIAGNOSIS — M25421 Effusion, right elbow: Secondary | ICD-10-CM | POA: Diagnosis not present

## 2015-01-29 DIAGNOSIS — R202 Paresthesia of skin: Secondary | ICD-10-CM | POA: Diagnosis not present

## 2015-01-29 DIAGNOSIS — M25521 Pain in right elbow: Secondary | ICD-10-CM | POA: Diagnosis not present

## 2015-01-29 DIAGNOSIS — M1A4291 Other secondary chronic gout, unspecified elbow, with tophus (tophi): Secondary | ICD-10-CM | POA: Diagnosis not present

## 2015-01-29 DIAGNOSIS — I739 Peripheral vascular disease, unspecified: Secondary | ICD-10-CM

## 2015-01-29 LAB — URIC ACID: Uric Acid, Serum: 7.5 mg/dL (ref 4.0–7.8)

## 2015-01-29 MED ORDER — INDOMETHACIN 50 MG PO CAPS: 50.0000 mg | ORAL_CAPSULE | Freq: Three times a day (TID) | ORAL | Status: DC

## 2015-01-29 MED ORDER — INDOMETHACIN ER 75 MG PO CPCR: 75.0000 mg | ORAL_CAPSULE | Freq: Two times a day (BID) | ORAL | Status: DC

## 2015-01-29 NOTE — Telephone Encounter (Signed)
Yes can change to formulary covered indomethacin as pharm suggested.

## 2015-01-29 NOTE — Telephone Encounter (Signed)
Pt was seen today and prescribed indomethacin SR 4m. This med is $85, so pt request an alternative sent in. Per [facility] if you to change his rx to plain indomethacin instead ot the extended release it would only be around $10-$15. Is this ok?

## 2015-01-29 NOTE — Progress Notes (Addendum)
   Subjective:    Patient ID: Caleb Anding Sr., male    DOB: 1934/06/08, 81 y.o.   MRN: [id number]  HPI  81 year old male with history of Dm and goutpresents with new pain and swelling  in right elbow,ongoing x 1 week. He has noted decreased swelling with no treatment.  Pain mild, more with bending. No redness.  He has been using cherry juice.    He has also noted tingling in fingers for a while. In past occurred while driving but now happening more frequently.  Sometimes cannot zip pants.  Left elbow with nodule chronically. Has not had gout flare for a while. Uric acid 7.3 on no preventative. Could not afford colchicine in past.  No current claudication symptoms in legs.  Review of Systems  Constitutional: Negative for fatigue.  HENT: Negative for ear pain.   Eyes: Negative for pain.  Respiratory: Negative for shortness of breath.   Cardiovascular: Negative for chest pain.       Objective:   Physical Exam  Constitutional: Vital signs are normal. He appears well-developed and well-nourished.  HENT:  Head: Normocephalic.  Right Ear: Hearing normal.  Left Ear: Hearing normal.  Nose: Nose normal.  Mouth/Throat: Oropharynx is clear and moist and mucous membranes are normal.  Neck: Trachea normal. Carotid bruit is not present. No thyroid mass and no thyromegaly present.  Cardiovascular: Normal rate, regular rhythm and normal pulses.  Exam reveals no gallop, no distant heart sounds and no friction rub.   No murmur heard. No peripheral edema  Pulmonary/Chest: Effort normal and breath sounds normal. No respiratory distress.  Musculoskeletal:       Right elbow: He exhibits decreased range of motion, swelling, effusion and deformity. He exhibits no laceration. No tenderness found. No radial head, no medial epicondyle, no lateral epicondyle and no olecranon process tenderness noted.  Consistent with olecranon bursitits. No redness, minimal increase warmth  Skin: Skin is  warm, dry and intact. No rash noted.  Psychiatric: He has a normal mood and affect. His speech is normal and behavior is normal. Thought content normal.          Assessment & Plan:

## 2015-01-29 NOTE — Progress Notes (Signed)
Pre visit review using our clinic review tool, if applicable. No additional management support is needed unless otherwise documented below in the visit note. 

## 2015-01-29 NOTE — Patient Instructions (Addendum)
Stop at lab on way out to check uric acid. Can use indocin twice a day for gout flares and for right elbow pain. Cal if not improving  In 1-2 weeks for further eval of  hand numbness like nerve conduction test.

## 2015-01-29 NOTE — Telephone Encounter (Signed)
New rx sent in , let pt know.

## 2015-01-29 NOTE — Telephone Encounter (Signed)
Left detailed message on voicemail.  

## 2015-01-29 NOTE — Telephone Encounter (Signed)
What strength should I prescribe?  The only 75 mg I see is the SR.

## 2015-02-22 ENCOUNTER — Telehealth: Payer: Self-pay | Admitting: Family Medicine

## 2015-02-22 NOTE — Assessment & Plan Note (Signed)
Trial of indocin for pain in elbow , can use for any gout flares as well. Eval with uric acid to assess control.

## 2015-02-22 NOTE — Assessment & Plan Note (Signed)
Most consistent with olecranon bursitis, resolving but may also represent tophi in pt with chronic gout. Check uric acid. Treat with NSAIDs. If does not continue to improve, consider aspiration.  No current sign of infection.

## 2015-02-22 NOTE — Assessment & Plan Note (Signed)
If if due to swelling at elbow or other nerve compression. Call if not improving  In 1-2 weeks for further eval of left hand numbness like nerve conduction test.

## 2015-02-22 NOTE — Telephone Encounter (Signed)
Patient Name: OAKLYN MANS DOB: [birth year] Initial Comment caller states husband has head pressure Nurse Assessment Nurse: Ronnald Ramp, RN, Miranda Date/Time (Eastern Time): 02/22/2015 8:42:35 AM Confirm and document reason for call. If symptomatic, describe symptoms. ---Caller states her husband has had head pressure since yesterday. Also has had dizziness off and on and nausea. Not having dizziness now. He has been sneezing and having nasal congestion for the last couple of days. Denies fever. Has the patient traveled out of the country within the last 30 days? ---No Does the patient require triage? ---Yes Related visit to physician within the last 2 weeks? ---No Does the PT have any chronic conditions? (i.e. diabetes, asthma, etc.) ---Yes List chronic conditions. ---HTN Guidelines Guideline Title Affirmed Question Affirmed Notes Common Cold Cold with no complications (all triage questions negative) Final Disposition User [place], RN, Jeannetta Nap

## 2015-02-28 NOTE — Op Note (Signed)
PATIENT NAME:  Caleb Bryant, Caleb Bryant MR#:  [id number] DATE OF BIRTH:  03/18/1934  DATE OF PROCEDURE:  11/05/2014  PREOPERATIVE DIAGNOSIS: Recurrent ventral hernia.   POSTOPERATIVE DIAGNOSIS: Recurrent ventral hernia.   OPERATIVE PROCEDURE: Laparoscopic ventral hernia repair with placement of Ventralight ST mesh.   SURGEON: Robert Bellow, MD  ANESTHESIA: General endotracheal under Dr. Ronelle Nigh.   ESTIMATED BLOOD LOSS: 5 mL.   CLINICAL NOTE: This 81 year old male had undergone a colostomy and subsequent takedown of colostomy several years ago. He has developed a recurrent hernia to the right of the midline at the superior aspect of the original incision. This has become increasingly uncomfortable and as he is still very active, it was elected to proceed to repair. The patient received Kefzol intravenously prior to the procedure. Hair was removed from the surgical field with clippers in the holding area. Pneumatic compression stockings were used for DVT prophylaxis.   OPERATIVE NOTE: With the patient under adequate general endotracheal anesthesia, the abdomen was prepped with ChloraPrep and draped. A point just below the left costal margin, lateral to the midclavicular line, was chosen for abdominal entry. Preoperative CT had shown this area to be clear of intestinal contents and what appeared to be a single 4 cm defect to the right of the midline at the site of the clinical hernia. A Veress needle was placed through the incision in the left upper quadrant. After assuring intra-abdominal location with the hanging drop test, the abdomen was insufflated with CO2 at 10 mmHg pressure. A 5 mm step port was expanded. Inspection showed no evidence of injury after initial port placement. Two additional 5 mm step ports were placed, one approximately 5 cm medial and superior to the original port and the other one approximately 8 cm inferior along the left anterior axillary line. There was a band of adhesions  covering the anterior abdominal wall. With careful blunt and cautery dissection, these were taken down to expose 3 fascial defects, which encompassed an area of approximately 5 cm in entirety. The remaining area was clear. Adhesions from the omentum and to the falciform ligament were taken down. The stomach and small bowel were kept in site at all times, and no evidence of intestinal injury was noted. The anterior abdominal wall was cleared and a 4 x 6 inch mesh was chosen. The inferior 5 mm port site was exchanged for a 12 mm XL port. The mesh was inserted and a suture grasper passed to the midportion of the fascial defect area, grasping the balloon device. The balloon was then insufflated with air, and the mesh smoothed along the anterior surface of the abdominal wall, transversely orientated. This was tacked circumferentially with the Ethicon SecureStrap tack. Six-point fixation was undertaken with 0 PDS sutures, using transfascial sutures. An additional series of tacks were used to approximate the mesh to the anterior abdominal wall. This showed good adhesion. The entry site was examined from the inferior port site and again, no evidence of injury from initial port placements. The 12 mm port site was closed with a 0 PDS suture. The abdomen was then desufflated under direct vision. The mesh remained appropriately adherent to the anterior abdominal wall without kinking or ripples. The ports were removed, and the skin incisions closed with 4-0 Vicryl subcuticular sutures. Benzoin and Steri-Strips were applied to the transfascial suture sites. Telfa and Tegaderm dressings were applied to the port site. ABD pads, a blue towel, and an abdominal binder were then applied. The patient tolerated the  procedure well and was taken to the recovery room in stable condition.    ____________________________ Robert Bellow, MD jwb:mw D: 11/05/2014 14:38:27 ET T: 11/05/2014 14:50:06 ET JOB#: [id number]  cc: Robert Bellow, MD, <Dictator> [name], MD Angad Nabers Amedeo Kinsman MD ELECTRONICALLY SIGNED 11/05/2014 15:26

## 2015-03-29 DIAGNOSIS — J0100 Acute maxillary sinusitis, unspecified: Secondary | ICD-10-CM | POA: Diagnosis not present

## 2015-03-31 ENCOUNTER — Telehealth: Payer: Self-pay

## 2015-03-31 NOTE — Telephone Encounter (Signed)
Please document under quality metric most recent EF 45-50% as of 02/06/14. This chart update is needed to meet the CHF quality metric requirement. Thank you

## 2015-04-01 NOTE — Telephone Encounter (Signed)
Done

## 2015-05-11 DIAGNOSIS — H20813 Fuchs' heterochromic cyclitis, bilateral: Secondary | ICD-10-CM | POA: Diagnosis not present

## 2015-06-08 ENCOUNTER — Telehealth: Payer: Self-pay | Admitting: Cardiovascular Disease

## 2015-06-08 ENCOUNTER — Ambulatory Visit (INDEPENDENT_AMBULATORY_CARE_PROVIDER_SITE_OTHER): Payer: Medicare Other | Admitting: Cardiovascular Disease

## 2015-06-08 ENCOUNTER — Encounter: Payer: Self-pay | Admitting: Cardiovascular Disease

## 2015-06-08 VITALS — BP 110/68 | HR 73 | Ht 66.0 in | Wt 186.2 lb

## 2015-06-08 DIAGNOSIS — I5022 Chronic systolic (congestive) heart failure: Secondary | ICD-10-CM | POA: Diagnosis not present

## 2015-06-08 DIAGNOSIS — R0789 Other chest pain: Secondary | ICD-10-CM | POA: Diagnosis not present

## 2015-06-08 DIAGNOSIS — Z8679 Personal history of other diseases of the circulatory system: Secondary | ICD-10-CM

## 2015-06-08 DIAGNOSIS — R0602 Shortness of breath: Secondary | ICD-10-CM | POA: Diagnosis not present

## 2015-06-08 DIAGNOSIS — I10 Essential (primary) hypertension: Secondary | ICD-10-CM

## 2015-06-08 MED ORDER — PANTOPRAZOLE SODIUM 40 MG PO TBEC: 40.0000 mg | DELAYED_RELEASE_TABLET | Freq: Every day | ORAL | Status: DC

## 2015-06-08 MED ORDER — PANTOPRAZOLE SODIUM 20 MG PO TBEC: 20.0000 mg | DELAYED_RELEASE_TABLET | Freq: Every day | ORAL | Status: DC

## 2015-06-08 NOTE — Assessment & Plan Note (Signed)
He is maintaining in sinus rhythm and there is no evidence of recurrent arrhythmia.

## 2015-06-08 NOTE — Telephone Encounter (Signed)
S/w pt who states he has been little short of breath for a few weeks. Has not affected his ADLs. Would like for Dr. Fletcher Anon to "check it out" Denies N/V/diaphoresis or any left arm pain States he is only taking one BP med; can't remember the name Thinks it is the hydrochlorothiazide and is about to run out Told pt if increasing SOB, chest pain radiating down left arm to go to ER. Pt states "it is not that bad" Confirmed 8/11, 8:15am appt.

## 2015-06-08 NOTE — Patient Instructions (Signed)
Medication Instructions:  Your physician has recommended you make the following change in your medication:  START taking protonix 40m once per day   Labwork: none  Testing/Procedures: Your physician has requested that you have an echocardiogram. Echocardiography is a painless test that uses sound waves to create images of your heart. It provides your doctor with information about the size and shape of your heart and how well your heart's chambers and valves are working. This procedure takes approximately one hour. There are no restrictions for this procedure.    Follow-Up: Your physician wants you to follow-up in: six months with Dr. AZannie Covewill receive a reminder letter in the mail two months in advance. If you don't receive a letter, please call our office to schedule the follow-up appointment.   Any Other Special Instructions Will Be Listed Below (If Applicable).  Echocardiogram An echocardiogram, or echocardiography, uses sound waves (ultrasound) to produce an image of your heart. The echocardiogram is simple, painless, obtained within a short period of time, and offers valuable information to your health care provider. The images from an echocardiogram can provide information such as:  Evidence of coronary artery disease (CAD).  Heart size.  Heart muscle function.  Heart valve function.  Aneurysm detection.  Evidence of a past heart attack.  Fluid buildup around the heart.  Heart muscle thickening.  Assess heart valve function. LET [facility] PROVIDER KNOW ABOUT:  Any allergies you have.  All medicines you are taking, including vitamins, herbs, eye drops, creams, and over-the-counter medicines.  Previous problems you or members of your family have had with the use of anesthetics.  Any blood disorders you have.  Previous surgeries you have had.  Medical conditions you have.  Possibility of pregnancy, if this applies. BEFORE THE PROCEDURE  No  special preparation is needed. Eat and drink normally.  PROCEDURE   In order to produce an image of your heart, gel will be applied to your chest and a wand-like tool (transducer) will be moved over your chest. The gel will help transmit the sound waves from the transducer. The sound waves will harmlessly bounce off your heart to allow the heart images to be captured in real-time motion. These images will then be recorded.  You may need an IV to receive a medicine that improves the quality of the pictures. AFTER THE PROCEDURE You may return to your normal schedule including diet, activities, and medicines, unless your health care provider tells you otherwise. Document Released: 10/13/2000 Document Revised: 03/02/2014 Document Reviewed: 06/23/2013 [facility] HospPatient Information 2015 [place] This information is not intended to replace advice given to you by your health care provider. Make sure you discuss any questions you have with your health care provider.

## 2015-06-08 NOTE — Assessment & Plan Note (Signed)
Blood pressure is controlled on hydrochlorothiazide.

## 2015-06-08 NOTE — Telephone Encounter (Signed)
Pt c/o Shortness Of Breath: STAT if SOB developed within the last 24 hours or pt is noticeably SOB on the phone  1. Are you currently SOB (can you hear that pt is SOB on the phone)? No   2. How long have you been experiencing SOB? A few weeks not sure   3. Are you SOB when sitting or when up moving around? Mostly when moving around   4. Are you currently experiencing any other symptoms? Chest tightness in center of chest    Pt c/o of Chest Pain: STAT if CP now or developed within 24 hours  1. Are you having CP right now? No   2. Are you experiencing any other symptoms (ex. SOB, nausea, vomiting, sweating)? SOB  3. How long have you been experiencing CP? Not exactly sure maybe a few weeks ago   4. Is your CP continuous or coming and going? Comes and goes   5. Have you taken Nitroglycerin? No  ?  Patient wants appt to check it out.   Scheduled with Arida on Thursday 8/11 at 8:15 am.   Please call patient.

## 2015-06-08 NOTE — Assessment & Plan Note (Addendum)
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 He now reports episodes of dyspnea. There is no evidence of fluid overload by physical exam. I requested an echocardiogram to evaluate LV systolic function. Some of his symptoms might be related to stress and anxiety. Patient had previous cardiac catheterization which showed no significant coronary artery disease. His chest pain previously improved with the PPI and thus I prescribed Protonix again.

## 2015-06-08 NOTE — Progress Notes (Signed)
Primary care physician: Dr. Diona Browner  HPI  This is a pleasant 81 year old man who is here today for a followup visit regarding atrial flutter status post ablation and nonischemic cardiomyopathy. He has known history of type 2 diabetes not on medications, previous tobacco use, hypertension and hyperlipidemia. He was seen in January of 2015 with shortness of breath and chest pain. He was found to be in atrial flutter with 2-1 AV block. He was admitted. I proceeded with cardiac catheterization which showed minor irregularities with no evidence of obstructive he has been disease. Echocardiogram showed an ejection fraction of 25-30%. He underwent catheter ablation by Dr. Lovena Le in February with no complications. Repeat echocardiogram in April 2015 showed an ejection fraction of 45-50% with trivial mitral regurgitation He now reports occasional episodes of exertional dyspnea. He reports and related chest pain which previously improved with Protonix. He ran out of the medication. He has been under stress lately at home. He continues to be able to do his yard work with no significant exertional symptoms.    Allergies  Allergen Reactions  . Codeine Nausea And Vomiting     Current Outpatient Prescriptions on File Prior to Visit  Medication Sig Dispense Refill  . Cinnamon 500 MG capsule Take 500 mg by mouth daily.    . hydrochlorothiazide (HYDRODIURIL) 25 MG tablet TAKE ONE TABLET BY MOUTH EVERY DAY 90 tablet 3  . vitamin B-12 (CYANOCOBALAMIN) 1000 MCG tablet Take 1,000 mcg by mouth daily.     No current facility-administered medications on file prior to visit.     Past Medical History  Diagnosis Date  . Colon, diverticulosis   . PVD (peripheral vascular disease)   . Benign prostatic hypertrophy   . Hypertension   . Hyperlipidemia   . Atrial flutter     "just dx'd today" (11/04/2013)  . Exertional shortness of breath   . Borderline diabetes   . Osteoarthritis     "all over" (11/04/2013)  .  Gout   . Anxiety      Past Surgical History  Procedure Laterality Date  . Partial colectomy  1997    "diverticulitis; busted; [facility]"  . Appendectomy  1997    "days before 1st colon OR"  . Tonsillectomy and adenoidectomy      child  . Ankle fracture surgery Right   . Colonoscopy with esophagogastroduodenoscopy (egd)  12/00    gastic polyp   . Stress cardiolite negative  9/02    EF 60%  . Cholecystectomy  12/03  . Colonoscopy with esophagogastroduodenoscopy (egd)  11/07,12/2011    gastritis, 2 gastic polyps  . Ventral hernia repair  11/07    [facility]  . Colostomy  1997  . Colostomy reversal      "think I wore it 4 months" (11/04/2013)  . Cataract extraction, bilateral Bilateral   . Corneal transplant Right   . Colon surgery      "diverticulitis"  . Albation  11/04/13  . Coronary angiogram  11/05/2013    Procedure: CORONARY ANGIOGRAM;  Surgeon: Wellington Hampshire, MD;  Location: [facility] C[facility] LAB;  Service: Cardiovascular;;  . Atrial flutter ablation N/A 11/26/2013    Procedure: ATRIAL FLUTTER ABLATION;  Surgeon: Evans Lance, MD;  Location: [facility] CATH LAB;  Service: Cardiovascular;  Laterality: N/A;  . Inguinal hernia repair Left   . Hernia repair  11/05/14    ventral hernia     Family History  Problem Relation Age of Onset  . Coronary artery disease Sister   .  Heart failure Mother      History   Social History  . Marital Status: Married    Spouse Name: N/A  . Number of Children: 3  . Years of Education: N/A   Occupational History  .     Social History Main Topics  . Smoking status: Former Smoker -- 3.00 packs/day for 30 years    Types: Cigarettes  . Smokeless tobacco: Former Systems developer    Types: Chew     Comment: 11/04/2013 "quit smoking in the 1980's or so; quit chewing before I quit smoking"  . Alcohol Use: No     Comment: 11/04/2013 "quit driking in 1980's; was called a weekend alcoholic"  . Drug Use: No  . Sexual Activity: No   Other Topics Concern  . Not on file    Social History Narrative   Still does lawn work.    No living will, Wife is HCPOA. (Reviewed 2015)     ROS A 10 point review of system was performed. It is negative other than that mentioned in the history of present illness.   PHYSICAL EXAM   BP 110/68 mmHg  Pulse 73  Ht _0  (1.676 m)  Wt 186 lb 4 oz (84.482 kg)  BMI 30.08 kg/m2 Constitutional: He is oriented to person, place, and time. He appears well-developed and well-nourished. No distress.  HENT: No nasal discharge.  Head: Normocephalic and atraumatic.  Eyes: Pupils are equal and round.  No discharge. Neck: Normal range of motion. Neck supple. No JVD present. No thyromegaly present.  Cardiovascular: Normal rate, regular rhythm, normal heart sounds. Exam reveals no gallop and no friction rub. No murmur heard.  Pulmonary/Chest: Effort normal and breath sounds normal. No stridor. No respiratory distress. He has no wheezes. He has no rales. He exhibits no tenderness.  Abdominal: Soft. Bowel sounds are normal. He exhibits no distension. There is no tenderness. There is no rebound and no guarding.  Musculoskeletal: Normal range of motion. He exhibits no edema and no tenderness.  Neurological: He is alert and oriented to person, place, and time. Coordination normal.  Skin: Skin is warm and dry. No rash noted. He is not diaphoretic. No erythema. No pallor.  Psychiatric: He has a normal mood and affect. His behavior is normal. Judgment and thought content normal.       EKG: Sinus  Rhythm  -Right bundle branch block with left axis -bifascicular block.   ABNORMAL     ASSESSMENT AND PLAN

## 2015-06-10 ENCOUNTER — Ambulatory Visit: Payer: PRIVATE HEALTH INSURANCE | Admitting: Cardiovascular Disease

## 2015-06-18 ENCOUNTER — Other Ambulatory Visit: Payer: PRIVATE HEALTH INSURANCE

## 2015-06-24 ENCOUNTER — Other Ambulatory Visit: Payer: PRIVATE HEALTH INSURANCE

## 2015-06-24 ENCOUNTER — Ambulatory Visit (INDEPENDENT_AMBULATORY_CARE_PROVIDER_SITE_OTHER): Payer: Medicare Other

## 2015-06-24 ENCOUNTER — Other Ambulatory Visit: Payer: Self-pay

## 2015-06-24 DIAGNOSIS — R0602 Shortness of breath: Secondary | ICD-10-CM

## 2015-07-06 ENCOUNTER — Telehealth: Payer: Self-pay | Admitting: Family Medicine

## 2015-07-06 NOTE — Telephone Encounter (Signed)
Please call and scheduled [employer] with fasting labs prior for Dr. Diona Browner.

## 2015-07-08 ENCOUNTER — Ambulatory Visit (INDEPENDENT_AMBULATORY_CARE_PROVIDER_SITE_OTHER): Payer: Medicare Other | Admitting: Family Medicine

## 2015-07-08 ENCOUNTER — Encounter: Payer: Self-pay | Admitting: Family Medicine

## 2015-07-08 VITALS — BP 138/80 | HR 83 | Temp 97.6°F | Ht 66.0 in | Wt 187.4 lb

## 2015-07-08 DIAGNOSIS — J209 Acute bronchitis, unspecified: Secondary | ICD-10-CM

## 2015-07-08 MED ORDER — AZITHROMYCIN 250 MG PO TABS: ORAL_TABLET | ORAL | Status: DC

## 2015-07-08 NOTE — Telephone Encounter (Signed)
Pt coming in today 9/8 for acute visit.  Spouse stated she would have him make his cpx  today

## 2015-07-08 NOTE — Progress Notes (Signed)
   Subjective:    Patient ID: Caleb Santillanes Sr., male    DOB: 1934/07/08, 81 y.o.   MRN: [id number]  Cough This is a new problem. The current episode started 1 to 4 weeks ago (2 weeks). The problem has been gradually worsening. The cough is productive of sputum and productive of purulent sputum. Associated symptoms include headaches and nasal congestion. Pertinent negatives include no chest pain, chills, ear congestion, ear pain, fever, myalgias, sore throat, shortness of breath or wheezing. Associated symptoms comments: Fatigue, weakness. The symptoms are aggravated by lying down. Risk factors for lung disease include smoking/tobacco exposure (former smoker 90 pack history). He has tried OTC cough suppressant for the symptoms. The treatment provided no relief. There is no history of asthma, bronchiectasis, bronchitis, COPD, emphysema, environmental allergies or pneumonia.    sick contact: grandson    Review of Systems  Constitutional: Positive for fatigue. Negative for fever and chills.  HENT: Negative for ear pain and sore throat.   Respiratory: Positive for cough. Negative for shortness of breath and wheezing.   Cardiovascular: Negative for chest pain.  Gastrointestinal: Negative for abdominal pain.  Musculoskeletal: Negative for myalgias.  Allergic/Immunologic: Negative for environmental allergies.  Neurological: Positive for headaches.       Objective:   Physical Exam  Constitutional: Vital signs are normal. He appears well-developed and well-nourished.  Non-toxic appearance. He does not appear ill. No distress.  HENT:  Head: Normocephalic and atraumatic.  Right Ear: Hearing, tympanic membrane, external ear and ear canal normal. No tenderness. No foreign bodies. Tympanic membrane is not retracted and not bulging.  Left Ear: Hearing, tympanic membrane, external ear and ear canal normal. No tenderness. No foreign bodies. Tympanic membrane is not retracted and not bulging.  Nose:  Nose normal. No mucosal edema or rhinorrhea. Right sinus exhibits no maxillary sinus tenderness and no frontal sinus tenderness. Left sinus exhibits no maxillary sinus tenderness and no frontal sinus tenderness.  Mouth/Throat: Uvula is midline, oropharynx is clear and moist and mucous membranes are normal. Normal dentition. No dental caries. No oropharyngeal exudate or tonsillar abscesses.  Eyes: Conjunctivae, EOM and lids are normal. Pupils are equal, round, and reactive to light. Lids are everted and swept, no foreign bodies found.  Neck: Trachea normal, normal range of motion and phonation normal. Neck supple. Carotid bruit is not present. No thyroid mass and no thyromegaly present.  Cardiovascular: Normal rate, regular rhythm, S1 normal, S2 normal, normal heart sounds, intact distal pulses and normal pulses.  Exam reveals no gallop.   No murmur heard. Pulmonary/Chest: Effort normal and breath sounds normal. No respiratory distress. He has no wheezes. He has no rhonchi. He has no rales.  Abdominal: Soft. Normal appearance and bowel sounds are normal. There is no hepatosplenomegaly. There is no tenderness. There is no rebound, no guarding and no CVA tenderness. No hernia.  Neurological: He is alert. He has normal reflexes.  Skin: Skin is warm, dry and intact. No rash noted.  Psychiatric: He has a normal mood and affect. His speech is normal and behavior is normal. Judgment normal.          Assessment & Plan:

## 2015-07-08 NOTE — Progress Notes (Signed)
Pre visit review using our clinic review tool, if applicable. No additional management support is needed unless otherwise documented below in the visit note.

## 2015-07-08 NOTE — Assessment & Plan Note (Signed)
2 week history worsening in former heavy smoker... Treat with antibitoics. Cough suppressant.

## 2015-07-08 NOTE — Patient Instructions (Addendum)
Complete antibiotics.  Rest, fluids.  Can try mucinex DM twice daily for cough  (no decongestant).  Can use zyrtec at bedtime for allergies. Call if not improving as expected, go to ER if any severe shortness of breath.

## 2015-07-15 LAB — HM DIABETES EYE EXAM

## 2015-07-29 DIAGNOSIS — M109 Gout, unspecified: Secondary | ICD-10-CM | POA: Diagnosis not present

## 2015-08-03 ENCOUNTER — Ambulatory Visit (INDEPENDENT_AMBULATORY_CARE_PROVIDER_SITE_OTHER): Payer: Medicare Other | Admitting: Family Medicine

## 2015-08-03 ENCOUNTER — Encounter: Payer: Self-pay | Admitting: Family Medicine

## 2015-08-03 VITALS — BP 140/90 | HR 93 | Temp 97.5°F | Ht 66.0 in | Wt 186.5 lb

## 2015-08-03 DIAGNOSIS — M109 Gout, unspecified: Secondary | ICD-10-CM

## 2015-08-03 DIAGNOSIS — M7022 Olecranon bursitis, left elbow: Secondary | ICD-10-CM | POA: Diagnosis not present

## 2015-08-03 MED ORDER — KETOROLAC TROMETHAMINE 60 MG/2ML IM SOLN
60.0000 mg | Freq: Once | INTRAMUSCULAR | Status: AC
  Administered 2015-08-03: 60 mg via INTRAMUSCULAR

## 2015-08-03 MED ORDER — INDOMETHACIN 25 MG PO CAPS: ORAL_CAPSULE | ORAL | Status: DC

## 2015-08-03 NOTE — Patient Instructions (Signed)
Given toradol shot in office.  Hold prednisone.  Start indocin regimen tommorow.  Call if redness, pain and swelling in elbow and toe not improving.

## 2015-08-03 NOTE — Assessment & Plan Note (Signed)
No suggestion of septic joint.  pt refuses aspiration today.  Will start with NSAID treatment.

## 2015-08-03 NOTE — Addendum Note (Signed)
Addended by: Carter Kitten on: 08/03/2015 02:20 PM   Modules accepted: Orders

## 2015-08-03 NOTE — Progress Notes (Signed)
Pre visit review using our clinic review tool, if applicable. No additional management support is needed unless otherwise documented below in the visit note.

## 2015-08-03 NOTE — Progress Notes (Signed)
Subjective:     Patient ID: Caleb Isabell Sr., male    DOB: 1934/07/22, 81 y.o.   MRN: [id number]  HPI 81 year old male with history of gout presetns with new onset pain in great toe and left elbow.  Pain in toe began in last week.. Redness , swelling and severe pain. Seen at Urgent Care last week.. Treated with prednisone taper.  Helped minimaly.  He eats a  Lot of liver  He bumped left elbow 3-4 days ago.. In last 24 hours he has had increase swelling, redness and pain. 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   Review of Systems  Constitutional: Negative for fever and fatigue.  HENT: Negative for ear pain.   Eyes: Negative for pain.  Respiratory: Negative for shortness of breath.   Cardiovascular: Negative for chest pain.  Gastrointestinal: Negative for abdominal pain.       Objective:   Physical Exam  Constitutional: Vital signs are normal. He appears well-developed and well-nourished.  HENT:  Head: Normocephalic.  Nose: Nose normal.  Mouth/Throat: Oropharynx is clear and moist and mucous membranes are normal.  Neck: Trachea normal. Carotid bruit is not present. No thyroid mass and no thyromegaly present.  Cardiovascular: Normal rate, regular rhythm and normal pulses.  Exam reveals no gallop, no distant heart sounds and no friction rub.   No murmur heard. No peripheral edema  Pulmonary/Chest: Effort normal and breath sounds normal. No respiratory distress.  Musculoskeletal:  Left great toe with swelling, deformity, ttp  And redness.  Bilateral elbows with swelling, left greater than right, fluids filled olecranon bursa, mild warmth and redness  Skin: Skin is warm, dry and intact. No rash noted.  Psychiatric: He has a normal mood and affect. His speech is normal and behavior is normal. Thought content  normal.          Assessment & Plan:

## 2015-08-03 NOTE — Assessment & Plan Note (Signed)
No improvement with prednisone taper, unclear dose.  Will start with toradol injection and follow with 5 days of indocin.

## 2015-08-10 NOTE — Assessment & Plan Note (Addendum)
Pt's  peripheral vascular disease if due to several issue including smoking history , high cholesterol but primarily due to presence of diabetes for years.   No current claudication symptoms on no medication.

## 2015-10-05 ENCOUNTER — Telehealth: Payer: Self-pay | Admitting: Family Medicine

## 2015-10-05 DIAGNOSIS — E1151 Type 2 diabetes mellitus with diabetic peripheral angiopathy without gangrene: Secondary | ICD-10-CM

## 2015-10-05 DIAGNOSIS — E538 Deficiency of other specified B group vitamins: Secondary | ICD-10-CM

## 2015-10-05 DIAGNOSIS — E7800 Pure hypercholesterolemia, unspecified: Secondary | ICD-10-CM

## 2015-10-05 NOTE — Telephone Encounter (Signed)
-----  Message from Marchia Bond sent at 10/01/2015 10:55 AM EST ----- Regarding: Cpx labs Thurs 12/8, need orders. Thanks! :-) Please order  future cpx labs for pt's upcoming lab appt. Thanks Aniceto Boss

## 2015-10-07 ENCOUNTER — Other Ambulatory Visit (INDEPENDENT_AMBULATORY_CARE_PROVIDER_SITE_OTHER): Payer: Medicare Other

## 2015-10-07 ENCOUNTER — Other Ambulatory Visit: Payer: Self-pay | Admitting: Family Medicine

## 2015-10-07 DIAGNOSIS — E7800 Pure hypercholesterolemia, unspecified: Secondary | ICD-10-CM

## 2015-10-07 DIAGNOSIS — E538 Deficiency of other specified B group vitamins: Secondary | ICD-10-CM

## 2015-10-07 DIAGNOSIS — E1151 Type 2 diabetes mellitus with diabetic peripheral angiopathy without gangrene: Secondary | ICD-10-CM | POA: Diagnosis not present

## 2015-10-07 LAB — COMPREHENSIVE METABOLIC PANEL
ALK PHOS: 64 U/L (ref 39–117)
ALT: 15 U/L (ref 0–53)
AST: 19 U/L (ref 0–37)
Albumin: 4.1 g/dL (ref 3.5–5.2)
BILIRUBIN TOTAL: 0.5 mg/dL (ref 0.2–1.2)
BUN: 21 mg/dL (ref 6–23)
CALCIUM: 9.7 mg/dL (ref 8.4–10.5)
CO2: 32 meq/L (ref 19–32)
Chloride: 100 mEq/L (ref 96–112)
Creatinine, Ser: 1.21 mg/dL (ref 0.40–1.50)
GFR: 61.36 mL/min (ref >60.00)
Glucose, Bld: 120 mg/dL — ABNORMAL HIGH (ref 70–99)
POTASSIUM: 4.5 meq/L (ref 3.5–5.1)
Sodium: 139 mEq/L (ref 135–145)
TOTAL PROTEIN: 7.4 g/dL (ref 6.0–8.3)

## 2015-10-07 LAB — LIPID PANEL
CHOLESTEROL: 172 mg/dL (ref 0–200)
HDL: 49.4 mg/dL (ref >39.00)
LDL CALC: 108 mg/dL — AB (ref 0–99)
NonHDL: 122.4
Total CHOL/HDL Ratio: 3
Triglycerides: 72 mg/dL (ref 0.0–149.0)
VLDL: 14.4 mg/dL (ref 0.0–40.0)

## 2015-10-07 LAB — VITAMIN B12: VITAMIN B 12: 306 pg/mL (ref 211–911)

## 2015-10-07 LAB — HEMOGLOBIN A1C: Hgb A1c MFr Bld: 6.5 % (ref 4.6–6.5)

## 2015-10-07 MED ORDER — HYDROCHLOROTHIAZIDE 25 MG PO TABS: 25.0000 mg | ORAL_TABLET | Freq: Every day | ORAL | Status: DC

## 2015-10-07 NOTE — Telephone Encounter (Signed)
Refill sent in as requested.

## 2015-10-07 NOTE — Telephone Encounter (Signed)
Pt came in for labs, he will need additional rx for hydrochlorothiazide (HYDRODIURIL) 25 MG tablet.  He will be out before his appointment 10/14/15.  Please refill to [facility]  [address], [place]  [id number]

## 2015-10-14 ENCOUNTER — Encounter: Payer: Self-pay | Admitting: Family Medicine

## 2015-10-14 ENCOUNTER — Ambulatory Visit (INDEPENDENT_AMBULATORY_CARE_PROVIDER_SITE_OTHER): Payer: Medicare Other | Admitting: Family Medicine

## 2015-10-14 VITALS — BP 140/80 | HR 72 | Temp 98.2°F | Ht 65.0 in | Wt 193.5 lb

## 2015-10-14 DIAGNOSIS — Z23 Encounter for immunization: Secondary | ICD-10-CM

## 2015-10-14 DIAGNOSIS — M1A9XX0 Chronic gout, unspecified, without tophus (tophi): Secondary | ICD-10-CM

## 2015-10-14 DIAGNOSIS — E1151 Type 2 diabetes mellitus with diabetic peripheral angiopathy without gangrene: Secondary | ICD-10-CM | POA: Diagnosis not present

## 2015-10-14 DIAGNOSIS — F03A0 Unspecified dementia, mild, without behavioral disturbance, psychotic disturbance, mood disturbance, and anxiety: Secondary | ICD-10-CM

## 2015-10-14 DIAGNOSIS — E538 Deficiency of other specified B group vitamins: Secondary | ICD-10-CM | POA: Diagnosis not present

## 2015-10-14 DIAGNOSIS — Z0000 Encounter for general adult medical examination without abnormal findings: Secondary | ICD-10-CM | POA: Diagnosis not present

## 2015-10-14 DIAGNOSIS — I10 Essential (primary) hypertension: Secondary | ICD-10-CM | POA: Diagnosis not present

## 2015-10-14 DIAGNOSIS — E7800 Pure hypercholesterolemia, unspecified: Secondary | ICD-10-CM | POA: Diagnosis not present

## 2015-10-14 DIAGNOSIS — F0390 Unspecified dementia without behavioral disturbance: Secondary | ICD-10-CM

## 2015-10-14 DIAGNOSIS — M546 Pain in thoracic spine: Secondary | ICD-10-CM

## 2015-10-14 DIAGNOSIS — Z7189 Other specified counseling: Secondary | ICD-10-CM | POA: Insufficient documentation

## 2015-10-14 LAB — HM DIABETES FOOT EXAM

## 2015-10-14 MED ORDER — DICLOFENAC SODIUM 75 MG PO TBEC: 75.0000 mg | DELAYED_RELEASE_TABLET | Freq: Two times a day (BID) | ORAL | Status: DC

## 2015-10-14 MED ORDER — ATORVASTATIN CALCIUM 20 MG PO TABS: 20.0000 mg | ORAL_TABLET | Freq: Every day | ORAL | Status: DC

## 2015-10-14 NOTE — Patient Instructions (Addendum)
Start low dose atorvastatin for cholesterol.  Work on [employer], low chol diet and regular exercise.  Decrease salt in diet.  Start home stretching .  Start diclofenac twice daily for pain and inflammation.  Call  For follow up if not improving in 2 weeks. Plan to X-ray at that time.

## 2015-10-14 NOTE — Progress Notes (Signed)
Pre visit review using our clinic review tool, if applicable. No additional management support is needed unless otherwise documented below in the visit note.

## 2015-10-14 NOTE — Progress Notes (Signed)
I have personally reviewed the Medicare Annual Wellness questionnaire and have noted 1. The patient's medical and social history 2. Their use of alcohol, tobacco or illicit drugs 3. Their current medications and supplements 4. The patient's functional ability including ADL's, fall risks, home safety risks and hearing or visual             impairment. 5. Diet and physical activities 6. Evidence for depression or mood disorders 7.         Updated provider list Cognitive evaluation was performed and recorded on pt medicare questionnaire form. The patients weight, height, BMI and visual acuity have been recorded in the chart  I have made referrals, counseling and provided education to the patient based review of the above and I have provided the pt with a written personalized care plan for preventive services.   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 Lovena Le. S/P catheter ablation. 10/2013 : cardiac catheterization showed minor irregularities with no evidence of obstructive disease. Echocardiogram 10/2103 showed an ejection fraction of 25-30%  After ablation: Repeat echocardiogram in April 2015 showed an ejection fraction of 45-50% with trivial mitral regurgitation  He has been having a lot of joint pain, mid back pain. Bilateral shoulder pain. R > L. Worst with int and ext rotation, abduction. Right hand goes to sleep and gets numb at time, whole right hand.  Feels grinding in neck when turns head. Told in past bone spurs on spine.  Has been  Going on for years, but getting worse. Has not taken any med for pain.  Ibuprofen 400 mg does not help much.  Legs and feet are tingling.  GERD, improved, no longer with chest pain on pantoprazole.  Hypertension: Well controlled on HCTZ  BP Readings from Last 3 Encounters:  10/14/15 140/80  08/03/15 140/90  07/08/15 138/80  Using medication without  problems or lightheadedness:  Chest pain : Has been started on protonix for possible GERD. Edema:None  Short of breath: stable Average home BPs: Stable at MD visit Other issues:   Diabetes:diet controlled  Lab Results  Component Value Date   HGBA1C 6.5 10/07/2015  Feet problems: None Blood Sugars averaging: not checking. eye exam within last year:   B12 def: resolved with injections.. Lab Results  Component Value Date   VITAMINB12 306 10/07/2015   Elevated Cholesterol: Almost at goal LDL <70.  Lab Results  Component Value Date   CHOL 172 10/07/2015   HDL 49.40 10/07/2015   LDLCALC 108* 10/07/2015   TRIG 72.0 10/07/2015   CHOLHDL 3 10/07/2015  Diet compliance: Poor eats a lot of sausage, [place], fish, eggs, grits, toast, fatback! He is trying to improve in last 6 months.  Exercise:with outdoor work  Other complaints:   Gout: Last uric acid:7.5 on no preventative. Drinking black cherry juice. Last gout flare: none in last few years.   Review of Systems  Constitutional: Negative for fever and unexpected weight change.  HENT: Negative for ear pain, congestion, sore throat, rhinorrhea, trouble swallowing and postnasal drip.  Eyes: Negative for pain.  Respiratory: Negative for cough, shortness of breath and wheezing.  Cardiovascular: Negative for chest pain, palpitations and leg swelling.  Gastrointestinal: Negative for nausea, abdominal pain, diarrhea, constipation and blood in stool.  Genitourinary: Negative for dysuria, urgency, hematuria, discharge, penile swelling, scrotal swelling, difficulty urinating, penile pain and testicular pain.  Musculoskeletal: Positive for back pain and arthralgias. Negative for  myalgias, joint swelling and gait problem.  Skin: Negative for rash.  Neurological: Negative for syncope, weakness, light-headedness, numbness and headaches.  Psychiatric/Behavioral: Negative for behavioral problems and dysphoric mood. The patient  is not nervous/anxious.  Objective:   Physical Exam  Constitutional: Vital signs are normal. He appears well-developed and well-nourished.  HENT:  Head: Normocephalic.  Right Ear: Hearing normal.  Left Ear: Hearing normal.  Nose: Nose normal.  Mouth/Throat: Oropharynx is clear and moist and mucous membranes are normal.  Neck: Trachea normal. Carotid bruit is not present. No mass and no thyromegaly present.  Cardiovascular: Normal rate, regular rhythm and normal pulses. Exam reveals no gallop, no distant heart sounds and no friction rub.  No murmur heard. No peripheral edema  Pulmonary/Chest: Effort normal and breath sounds normal. No respiratory distress.  Musculoskeletal:  Skin: Skin is warm, dry and intact. No rash noted.  Psychiatric: He has a normal mood and affect. His speech is normal and behavior is normal. Thought content normal.   Diabetic foot exam: Normal inspection No skin breakdown No calluses  Normal DP pulses Normal sensation to light touch and monofilament Nails normal  Assessment & Plan:   AMW: The patient's preventative maintenance and recommended screening tests for an annual wellness exam were reviewed in full today.  Brought up to date unless services declined.  Counselled on the importance of diet, exercise, and its role in overall health and mortality.  The patient's FH and SH was reviewed, including their home life, tobacco status, and drug and alcohol status.   Vaccines: Uptodate with Td, PNA, refuses shingles vaccine. Given flu. Colon: Ifob pos 2012, had nml egd and colonoscopy 12/2011, repeat in 10 years  Prostate:Not indicated at this age.  Former smoker, remote quit > 15 years ago, no cough.

## 2015-10-19 ENCOUNTER — Encounter: Payer: Self-pay | Admitting: Family Medicine

## 2015-10-19 DIAGNOSIS — M546 Pain in thoracic spine: Secondary | ICD-10-CM | POA: Insufficient documentation

## 2015-10-19 NOTE — Assessment & Plan Note (Signed)
Start home stretching .  Start diclofenac twice daily for pain and inflammation.  Call  For follow up if not improving in 2 weeks. Plan to X-ray at that time.

## 2015-10-19 NOTE — Assessment & Plan Note (Signed)
Diet controlled.

## 2015-10-19 NOTE — Assessment & Plan Note (Signed)
Resolved with injections monthly.

## 2015-10-19 NOTE — Assessment & Plan Note (Signed)
Stable control with diet despite HCTZ. Pt does not wish to change HCTZ. On no preventative.

## 2015-10-19 NOTE — Assessment & Plan Note (Signed)
Well controlled. Continue current medication.  Low salt diet.

## 2015-10-19 NOTE — Assessment & Plan Note (Signed)
Stable.

## 2015-10-19 NOTE — Assessment & Plan Note (Signed)
Start low dose atorvastatin for cholesterol.  Work on [employer], low chol diet and regular exercise.

## 2015-10-28 ENCOUNTER — Encounter: Payer: Self-pay | Admitting: *Deleted

## 2015-12-07 ENCOUNTER — Encounter: Payer: Self-pay | Admitting: Cardiovascular Disease

## 2015-12-07 ENCOUNTER — Ambulatory Visit (INDEPENDENT_AMBULATORY_CARE_PROVIDER_SITE_OTHER): Payer: Medicare Other | Admitting: Cardiovascular Disease

## 2015-12-07 VITALS — BP 158/82 | HR 82 | Ht 66.0 in | Wt 196.0 lb

## 2015-12-07 DIAGNOSIS — Z8679 Personal history of other diseases of the circulatory system: Secondary | ICD-10-CM | POA: Diagnosis not present

## 2015-12-07 DIAGNOSIS — I200 Unstable angina: Secondary | ICD-10-CM | POA: Diagnosis not present

## 2015-12-07 DIAGNOSIS — I5022 Chronic systolic (congestive) heart failure: Secondary | ICD-10-CM

## 2015-12-07 DIAGNOSIS — I10 Essential (primary) hypertension: Secondary | ICD-10-CM

## 2015-12-07 NOTE — Assessment & Plan Note (Signed)
His blood pressure is elevated today likely due to high sodium intake. I provided him with low sodium diet instructions.

## 2015-12-07 NOTE — Assessment & Plan Note (Addendum)
The patient had successful ablation with no evidence of recurrent arrhythmia. His ejection fraction improved to normal. He can follow-up with me on a yearly basis.  I discontinued Protonix today which I prescribed last year for chest pain.

## 2015-12-07 NOTE — Progress Notes (Signed)
Primary care physician: Dr. Diona Browner  HPI  This is a pleasant 82 year old man who is here today for a followup visit regarding atrial flutter status post ablation and nonischemic cardiomyopathy. He has known history of type 2 diabetes not on medications, previous tobacco use, hypertension and hyperlipidemia.  He chest pain in the setting of atrial flutter in January of 2015 . Cardiac catheterization showed minor irregularities with no evidence of obstructive coronary artery disease. Echocardiogram showed an ejection fraction of 25-30%. He underwent catheter ablation by Dr. Lovena Le in February 4098 with no complications.  Ejection fraction improved gradually in most recent echocardiogram in August 2016 showed normal LV systolic function. He had chest pain last year thought to be due to GERD. He was placed on Protonix with subsequent improvement. His blood pressure is elevated today. He had a fried bologna sandwich this morning. He does not follow a low-sodium diet.   Allergies  Allergen Reactions  . Codeine Nausea And Vomiting     Current Outpatient Prescriptions on File Prior to Visit  Medication Sig Dispense Refill  . atorvastatin (LIPITOR) 20 MG tablet Take 1 tablet (20 mg total) by mouth daily. 90 tablet 3  . Cinnamon 500 MG capsule Take 500 mg by mouth daily.    . diclofenac (VOLTAREN) 75 MG EC tablet Take 1 tablet (75 mg total) by mouth 2 (two) times daily. 30 tablet 0  . hydrochlorothiazide (HYDRODIURIL) 25 MG tablet Take 1 tablet (25 mg total) by mouth daily. 90 tablet 0  . pantoprazole (PROTONIX) 40 MG tablet Take 1 tablet (40 mg total) by mouth daily. 30 tablet 6  . vitamin B-12 (CYANOCOBALAMIN) 1000 MCG tablet Take 1,000 mcg by mouth daily.     No current facility-administered medications on file prior to visit.     Past Medical History  Diagnosis Date  . Colon, diverticulosis   . PVD (peripheral vascular disease) ([place])   . Benign prostatic hypertrophy   . Hypertension     . Hyperlipidemia   . Atrial flutter ([place])     "just dx'd today" (11/04/2013)  . Exertional shortness of breath   . Borderline diabetes   . Osteoarthritis     "all over" (11/04/2013)  . Gout   . Anxiety      Past Surgical History  Procedure Laterality Date  . Partial colectomy  1997    "diverticulitis; busted; [facility]"  . Appendectomy  1997    "days before 1st colon OR"  . Tonsillectomy and adenoidectomy      child  . Ankle fracture surgery Right   . Colonoscopy with esophagogastroduodenoscopy (egd)  12/00    gastic polyp   . Stress cardiolite negative  9/02    EF 60%  . Cholecystectomy  12/03  . Colonoscopy with esophagogastroduodenoscopy (egd)  11/07,12/2011    gastritis, 2 gastic polyps  . Ventral hernia repair  11/07    [facility]  . Colostomy  1997  . Colostomy reversal      "think I wore it 4 months" (11/04/2013)  . Cataract extraction, bilateral Bilateral   . Corneal transplant Right   . Colon surgery      "diverticulitis"  . Albation  11/04/13  . Coronary angiogram  11/05/2013    Procedure: CORONARY ANGIOGRAM;  Surgeon: Wellington Hampshire, MD;  Location: [facility] CATH LAB;  Service: Cardiovascular;;  . Atrial flutter ablation N/A 11/26/2013    Procedure: ATRIAL FLUTTER ABLATION;  Surgeon: Evans Lance, MD;  Location: [facility] CATH LAB;  Service: Cardiovascular;  Laterality: N/A;  . Inguinal hernia repair Left   . Hernia repair  11/05/14    ventral hernia     Family History  Problem Relation Age of Onset  . Coronary artery disease Sister   . Heart failure Mother      Social History   Social History  . Marital Status: Married    Spouse Name: N/A  . Number of Children: 3  . Years of Education: N/A   Occupational History  .     Social History Main Topics  . Smoking status: Former Smoker -- 3.00 packs/day for 30 years    Types: Cigarettes  . Smokeless tobacco: Former Systems developer    Types: Chew     Comment: 11/04/2013 "quit smoking in the 1980's or so; quit chewing before I quit  smoking"  . Alcohol Use: No     Comment: 11/04/2013 "quit driking in 1980's; was called a weekend alcoholic"  . Drug Use: No  . Sexual Activity: No   Other Topics Concern  . Not on file   Social History Narrative   Still does lawn work.    No living will, Wife is HCPOA. (Reviewed 2015)     ROS A 10 point review of system was performed. It is negative other than that mentioned in the history of present illness.   PHYSICAL EXAM   BP 158/82 mmHg  Pulse 82  Ht _0  (1.676 m)  Wt 196 lb (88.905 kg)  BMI 31.65 kg/m2 Constitutional: He is oriented to person, place, and time. He appears well-developed and well-nourished. No distress.  HENT: No nasal discharge.  Head: Normocephalic and atraumatic.  Eyes: Pupils are equal and round.  No discharge. Neck: Normal range of motion. Neck supple. No JVD present. No thyromegaly present.  Cardiovascular: Normal rate, regular rhythm, normal heart sounds. Exam reveals no gallop and no friction rub. No murmur heard.  Pulmonary/Chest: Effort normal and breath sounds normal. No stridor. No respiratory distress. He has no wheezes. He has no rales. He exhibits no tenderness.  Abdominal: Soft. Bowel sounds are normal. He exhibits no distension. There is no tenderness. There is no rebound and no guarding.  Musculoskeletal: Normal range of motion. He exhibits no edema and no tenderness.  Neurological: He is alert and oriented to person, place, and time. Coordination normal.  Skin: Skin is warm and dry. No rash noted. He is not diaphoretic. No erythema. No pallor.  Psychiatric: He has a normal mood and affect. His behavior is normal. Judgment and thought content normal.       EKG: Sinus  Rhythm with PACs -Right bundle branch block with left axis -bifascicular block.   ABNORMAL     ASSESSMENT AND PLAN

## 2015-12-07 NOTE — Patient Instructions (Addendum)
Medication Instructions:  Your physician has recommended you make the following change in your medication:  STOP taking protonix   Labwork: none  Testing/Procedures: none  Follow-Up: Your physician wants you to follow-up in: one year with Dr. Fletcher Anon.  You will receive a reminder letter in the mail two months in advance. If you don't receive a letter, please call our office to schedule the follow-up appointment.   Any Other Special Instructions Will Be Listed Below (If Applicable). Please follow a low sodium diet  .Low-Sodium Eating Plan Sodium raises blood pressure and causes water to be held in the body. Getting less sodium from food will help lower your blood pressure, reduce any swelling, and protect your heart, liver, and kidneys. We get sodium by adding salt (sodium chloride) to food. Most of our sodium comes from canned, boxed, and frozen foods. Restaurant foods, fast foods, and pizza are also very high in sodium. Even if you take medicine to lower your blood pressure or to reduce fluid in your body, getting less sodium from your food is important. WHAT IS MY PLAN? Most people should limit their sodium intake to 2,300 mg a day. Your health care provider recommends that you limit your sodium intake   WHAT DO I NEED TO KNOW ABOUT THIS EATING PLAN? For the low-sodium eating plan, you will follow these general guidelines:  Choose foods with a % Daily Value for sodium of less than 5% (as listed on the food label).   Use salt-free seasonings or herbs instead of table salt or sea salt.   Check with your health care provider or pharmacist before using salt substitutes.   Eat fresh foods.  Eat more vegetables and fruits.  Limit canned vegetables. If you do use them, rinse them well to decrease the sodium.   Limit cheese to 1 oz (28 g) per day.   Eat lower-sodium products, often labeled as "lower sodium" or "no salt added."  Avoid foods that contain monosodium glutamate  (MSG). MSG is sometimes added to [place] food and some canned foods.  Check food labels (Nutrition Facts labels) on foods to learn how much sodium is in one serving.  Eat more home-cooked food and less restaurant, buffet, and fast food.  When eating at a restaurant, ask that your food be prepared with less salt, or no salt if possible.  HOW DO I READ FOOD LABELS FOR SODIUM INFORMATION? The Nutrition Facts label lists the amount of sodium in one serving of the food. If you eat more than one serving, you must multiply the listed amount of sodium by the number of servings. Food labels may also identify foods as:  Sodium free--Less than 5 mg in a serving.  Very low sodium--35 mg or less in a serving.  Low sodium--140 mg or less in a serving.  Light in sodium--50% less sodium in a serving. For example, if a food that usually has 300 mg of sodium is changed to become light in sodium, it will have 150 mg of sodium.  Reduced sodium--25% less sodium in a serving. For example, if a food that usually has 400 mg of sodium is changed to reduced sodium, it will have 300 mg of sodium. WHAT FOODS CAN I EAT? Grains Low-sodium cereals, including oats, puffed wheat and rice, and shredded wheat cereals. Low-sodium crackers. Unsalted rice and pasta. Lower-sodium bread.  Vegetables Frozen or fresh vegetables. Low-sodium or reduced-sodium canned vegetables. Low-sodium or reduced-sodium tomato sauce and paste. Low-sodium or reduced-sodium tomato and vegetable  juices.  Fruits Fresh, frozen, and canned fruit. Fruit juice.  Meat and Other Protein Products Low-sodium canned tuna and salmon. Fresh or frozen meat, poultry, seafood, and fish. Lamb. Unsalted nuts. Dried beans, peas, and lentils without added salt. Unsalted canned beans. Homemade soups without salt. Eggs.  Dairy Milk. Soy milk. Ricotta cheese. Low-sodium or reduced-sodium cheeses. Yogurt.  Condiments Fresh and dried herbs and spices.  Salt-free seasonings. Onion and garlic powders. Low-sodium varieties of mustard and ketchup. Fresh or refrigerated horseradish. Lemon juice.  Fats and Oils Reduced-sodium salad dressings. Unsalted butter.  Other Unsalted popcorn and pretzels.  The items listed above may not be a complete list of recommended foods or beverages. Contact your dietitian for more options. WHAT FOODS ARE NOT RECOMMENDED? Grains Instant hot cereals. Bread stuffing, pancake, and biscuit mixes. Croutons. Seasoned rice or pasta mixes. Noodle soup cups. Boxed or frozen macaroni and cheese. Self-rising flour. Regular salted crackers. Vegetables Regular canned vegetables. Regular canned tomato sauce and paste. Regular tomato and vegetable juices. Frozen vegetables in sauces. Salted [place] fries. Olives. Angie Fava. Relishes. Sauerkraut. Salsa. Meat and Other Protein Products Salted, canned, smoked, spiced, or pickled meats, seafood, or fish. Bacon, ham, sausage, hot dogs, corned beef, chipped beef, and packaged luncheon meats. Salt pork. Jerky. Pickled herring. Anchovies, regular canned tuna, and sardines. Salted nuts. Dairy Processed cheese and cheese spreads. Cheese curds. Blue cheese and cottage cheese. Buttermilk.  Condiments Onion and garlic salt, seasoned salt, table salt, and sea salt. Canned and packaged gravies. Worcestershire sauce. Tartar sauce. Barbecue sauce. Teriyaki sauce. Soy sauce, including reduced sodium. Steak sauce. Fish sauce. Oyster sauce. Cocktail sauce. Horseradish that you find on the shelf. Regular ketchup and mustard. Meat flavorings and tenderizers. Bouillon cubes. Hot sauce. Tabasco sauce. Marinades. Taco seasonings. Relishes. Fats and Oils Regular salad dressings. Salted butter. Margarine. Ghee. Bacon fat.  Other Potato and tortilla chips. Corn chips and puffs. Salted popcorn and pretzels. Canned or dried soups. Pizza. Frozen entrees and pot pies.  The items listed above may not be a  complete list of foods and beverages to avoid. Contact your dietitian for more information.   This information is not intended to replace advice given to you by your health care provider. Make sure you discuss any questions you have with your health care provider.   Document Released: 04/07/2002 Document Revised: 11/06/2014 Document Reviewed: 08/20/2013 Elsevier Interactive Patient Education [employer].      If you need a refill on your cardiac medications before your next appointment, please call your pharmacy.

## 2015-12-20 DIAGNOSIS — L570 Actinic keratosis: Secondary | ICD-10-CM | POA: Diagnosis not present

## 2015-12-20 DIAGNOSIS — L821 Other seborrheic keratosis: Secondary | ICD-10-CM | POA: Diagnosis not present

## 2015-12-31 ENCOUNTER — Other Ambulatory Visit: Payer: Self-pay | Admitting: Family Medicine

## 2016-01-25 ENCOUNTER — Encounter: Payer: Self-pay | Admitting: Family Medicine

## 2016-01-25 ENCOUNTER — Ambulatory Visit (INDEPENDENT_AMBULATORY_CARE_PROVIDER_SITE_OTHER)
Admission: RE | Admit: 2016-01-25 | Discharge: 2016-01-25 | Disposition: A | Discharge status: Home / Self Care | Payer: Medicare Other | Source: Ambulatory Visit | Attending: Family Medicine | Admitting: Family Medicine

## 2016-01-25 ENCOUNTER — Ambulatory Visit (INDEPENDENT_AMBULATORY_CARE_PROVIDER_SITE_OTHER): Payer: Medicare Other | Admitting: Family Medicine

## 2016-01-25 VITALS — BP 110/64 | HR 90 | Temp 98.5°F | Ht 66.0 in | Wt 195.0 lb

## 2016-01-25 DIAGNOSIS — R938 Abnormal findings on diagnostic imaging of other specified body structures: Secondary | ICD-10-CM

## 2016-01-25 DIAGNOSIS — R05 Cough: Secondary | ICD-10-CM

## 2016-01-25 DIAGNOSIS — J441 Chronic obstructive pulmonary disease with (acute) exacerbation: Secondary | ICD-10-CM

## 2016-01-25 DIAGNOSIS — M25512 Pain in left shoulder: Secondary | ICD-10-CM

## 2016-01-25 DIAGNOSIS — R52 Pain, unspecified: Secondary | ICD-10-CM | POA: Insufficient documentation

## 2016-01-25 DIAGNOSIS — I200 Unstable angina: Secondary | ICD-10-CM

## 2016-01-25 DIAGNOSIS — M25511 Pain in right shoulder: Secondary | ICD-10-CM | POA: Diagnosis not present

## 2016-01-25 DIAGNOSIS — R0602 Shortness of breath: Secondary | ICD-10-CM | POA: Diagnosis not present

## 2016-01-25 DIAGNOSIS — J449 Chronic obstructive pulmonary disease, unspecified: Secondary | ICD-10-CM | POA: Insufficient documentation

## 2016-01-25 DIAGNOSIS — R053 Chronic cough: Secondary | ICD-10-CM

## 2016-01-25 DIAGNOSIS — R9389 Abnormal findings on diagnostic imaging of other specified body structures: Secondary | ICD-10-CM | POA: Insufficient documentation

## 2016-01-25 MED ORDER — ALBUTEROL SULFATE HFA 108 (90 BASE) MCG/ACT IN AERS: 2.0000 | INHALATION_SPRAY | Freq: Four times a day (QID) | RESPIRATORY_TRACT | Status: DC | PRN

## 2016-01-25 MED ORDER — PREDNISONE 20 MG PO TABS: ORAL_TABLET | ORAL | Status: DC

## 2016-01-25 MED ORDER — TIOTROPIUM BROMIDE MONOHYDRATE 18 MCG IN CAPS: 18.0000 ug | ORAL_CAPSULE | Freq: Every day | RESPIRATORY_TRACT | Status: DC

## 2016-01-25 NOTE — Patient Instructions (Addendum)
Okay to try trial off BP med for 1 week, if no improvement in body pain, restart. Can do similar with chol med as well after.  If not improving, call.  COPD and emphysema changes on chest x-ray. Recommend chest CT to eval further. Complete prednisone taper,  Start spiriva daily every day to treat COPD and improve overall breathing. Start albuterol for rescue as needed for wheeze and shortness of breath.  Go to ER if severe shortness of breath.

## 2016-01-25 NOTE — Assessment & Plan Note (Addendum)
PFTs shows restriction.. But significant changes on CXR with  Suggestion of chronic bronchitis and blebs suggesting emphesema.  Will start pt on pred taper, albuterol prn and start spiriva daily.

## 2016-01-25 NOTE — Progress Notes (Signed)
Subjective:    Patient ID: Caleb Privitera Sr., male    DOB: 1934/03/30, 82 y.o.   MRN: [id number]  Cough This is a new problem. The current episode started in the past 7 days. The problem has been gradually worsening. The cough is non-productive. Associated symptoms include a sore throat, shortness of breath and wheezing. Pertinent negatives include no chest pain, chills, ear congestion, ear pain, fever, headaches, nasal congestion, postnasal drip or rhinorrhea. Associated symptoms comments: Sneezing, itchy eyes  no swelling in ankles. The symptoms are aggravated by lying down. Risk factors for lung disease include smoking/tobacco exposure (90 pack year history). Treatments tried: halls.  Wheezing  Associated symptoms include coughing, shortness of breath and a sore throat. Pertinent negatives include no chest pain, chills, ear pain, fever, headaches or rhinorrhea.   Never feels like he got better from last infection 07/2015.. Was given azithromycin.  Did not help much.  He also reports that he hurts all over.. Bones, joints. Back shoulders. Started before he started chol med. Diclofenac did not help at all.   Social History /Family History/Past Medical History reviewed and updated if needed. DM, CHF Review of Systems  Constitutional: Negative for fever and chills.  HENT: Positive for sore throat. Negative for ear pain, postnasal drip and rhinorrhea.   Respiratory: Positive for cough, shortness of breath and wheezing.   Cardiovascular: Negative for chest pain.  Neurological: Negative for headaches.       Objective:   Physical Exam  Constitutional: Vital signs are normal. He appears well-developed and well-nourished.  HENT:  Head: Normocephalic.  Right Ear: Hearing normal.  Left Ear: Hearing normal.  Nose: Nose normal.  Mouth/Throat: Oropharynx is clear and moist and mucous membranes are normal.  Neck: Trachea normal. Carotid bruit is not present. No thyroid mass and no  thyromegaly present.  Cardiovascular: Normal rate, regular rhythm and normal pulses.  Exam reveals no gallop, no distant heart sounds and no friction rub.   No murmur heard. No peripheral edema  Pulmonary/Chest: Effort normal. No respiratory distress. He has decreased breath sounds in the right middle field and the right lower field. He has rhonchi in the right middle field and the right lower field.  Skin: Skin is warm, dry and intact. No rash noted.  Psychiatric: He has a normal mood and affect. His speech is normal and behavior is normal. Thought content normal.          Assessment & Plan:

## 2016-01-25 NOTE — Assessment & Plan Note (Signed)
Pt feels that it is from his medication.Marland Kitchen He will try trial of his HCTZ and chol med. Will restart if not improving. Most likely secondary to  OA in multiple locations, but no improvement with diclofenac. Will need to eval individual areas for treatment at follow up.

## 2016-01-25 NOTE — Assessment & Plan Note (Signed)
Likely due to arthritis.. Actually seen on CWR in [facility] joints bilaterally.

## 2016-01-25 NOTE — Progress Notes (Signed)
Pre visit review using our clinic review tool, if applicable. No additional management support is needed unless otherwise documented below in the visit note.

## 2016-01-25 NOTE — Assessment & Plan Note (Signed)
Will eval lungs further with  Chest CT given extensive changes on CXR and longterm prior smoking history.  Rule out lung cancer.

## 2016-01-26 ENCOUNTER — Telehealth: Payer: Self-pay

## 2016-01-26 LAB — BUN+CREAT: BUN / CREAT RATIO: 14.2 ratio (ref 6–22)

## 2016-01-26 LAB — BUN: BUN: 20 mg/dL (ref 7–25)

## 2016-01-26 LAB — CREATININE, SERUM: Creat: 1.41 mg/dL — ABNORMAL HIGH (ref 0.70–1.11)

## 2016-01-26 NOTE — Telephone Encounter (Signed)
Dr. B patient - I would rather defer to her.

## 2016-01-26 NOTE — Telephone Encounter (Signed)
Pt's wife called and stated that the Rx for Spiriva is almost $400 and pt does not have Rx plan. Is there another inhaler that may be cheaper for pt? Please advise---I will be out of office this afternoon, please forward to Butch Penny or Rena

## 2016-01-27 NOTE — Telephone Encounter (Signed)
Advair? Symbicort,? Dulera?

## 2016-01-27 NOTE — Telephone Encounter (Signed)
Advair $394.98; Symbicort $338.67; Caleb Bryant $319.31 according to Good Rx for [facility] without insurance.

## 2016-01-28 ENCOUNTER — Ambulatory Visit: Payer: Medicare Other | Admitting: Family Medicine

## 2016-01-28 ENCOUNTER — Ambulatory Visit
Admission: RE | Admit: 2016-01-28 | Discharge: 2016-01-28 | Disposition: A | Discharge status: Home / Self Care | Payer: Medicare Other | Source: Ambulatory Visit | Attending: Family Medicine | Admitting: Family Medicine

## 2016-01-28 DIAGNOSIS — J849 Interstitial pulmonary disease, unspecified: Secondary | ICD-10-CM | POA: Diagnosis not present

## 2016-01-28 DIAGNOSIS — R05 Cough: Secondary | ICD-10-CM | POA: Diagnosis present

## 2016-01-28 DIAGNOSIS — R9389 Abnormal findings on diagnostic imaging of other specified body structures: Secondary | ICD-10-CM

## 2016-01-28 DIAGNOSIS — R053 Chronic cough: Secondary | ICD-10-CM

## 2016-01-28 DIAGNOSIS — I712 Thoracic aortic aneurysm, without rupture, unspecified: Secondary | ICD-10-CM

## 2016-01-28 DIAGNOSIS — R918 Other nonspecific abnormal finding of lung field: Secondary | ICD-10-CM | POA: Diagnosis not present

## 2016-01-28 DIAGNOSIS — J441 Chronic obstructive pulmonary disease with (acute) exacerbation: Secondary | ICD-10-CM

## 2016-01-28 MED ORDER — IOPAMIDOL (ISOVUE-300) INJECTION 61%
75.0000 mL | Freq: Once | INTRAVENOUS | Status: AC | PRN
  Administered 2016-01-28: 75 mL via INTRAVENOUS

## 2016-01-28 NOTE — Telephone Encounter (Signed)
Pt called ONLY to speak to CMA DL.  Would not convey what message was regarding, very strongly requested call back to [phone]

## 2016-01-28 NOTE — Telephone Encounter (Signed)
Mr. Berthold stopped by office and told me not to look for anything other options.  I advised we could try and apply for a patient assistance program and try to get him the Spiriva free through them but he would have to supply income and tax information.  He states he doesn't think he needs an inhaler. He thinks he is just congestion for the cold.   He states he will finish the prednisone and if he feels he needs the inhaler he will call me back.

## 2016-01-28 NOTE — Telephone Encounter (Signed)
Okay.. So, no cheaper meds.. Is there med assistance or program he would qualify for... He could contact the drug companies.

## 2016-01-28 NOTE — Telephone Encounter (Signed)
Noted.

## 2016-01-28 NOTE — Telephone Encounter (Signed)
Spoke with Caleb Bryant.  I was able to print him off a prescription coupon from [employer] where he can get his inhaler for $10 a month for the next 12 months.  He will stop by office and pick up coupon to take to pharmacy.

## 2016-01-28 NOTE — Telephone Encounter (Signed)
Caleb Bryant called to say the coupon I printed for him only works if you have insurance per pharmacist.  Advised I would look into other options and call him back.

## 2016-02-01 ENCOUNTER — Telehealth: Payer: Self-pay | Admitting: Family Medicine

## 2016-02-01 DIAGNOSIS — R053 Chronic cough: Secondary | ICD-10-CM

## 2016-02-01 DIAGNOSIS — R05 Cough: Secondary | ICD-10-CM

## 2016-02-01 DIAGNOSIS — I712 Thoracic aortic aneurysm, without rupture, unspecified: Secondary | ICD-10-CM | POA: Insufficient documentation

## 2016-02-01 DIAGNOSIS — F0390 Unspecified dementia without behavioral disturbance: Secondary | ICD-10-CM

## 2016-02-01 DIAGNOSIS — R9389 Abnormal findings on diagnostic imaging of other specified body structures: Secondary | ICD-10-CM

## 2016-02-01 DIAGNOSIS — F03A0 Unspecified dementia, mild, without behavioral disturbance, psychotic disturbance, mood disturbance, and anxiety: Secondary | ICD-10-CM

## 2016-02-01 NOTE — Telephone Encounter (Addendum)
Spoke with pt and wife in detail.Marland Kitchen He has decided to try spiriva.. Got samples from a friend. If not improving he will follow up. Refuses pulmonary referral at this time.  Agreeable to referral for  Vascular surgeon for TAA.

## 2016-02-01 NOTE — Telephone Encounter (Signed)
Discussed results of Chest CTwith wife and pt. Made referral to pulmonary and vascular.

## 2016-02-10 DIAGNOSIS — I10 Essential (primary) hypertension: Secondary | ICD-10-CM | POA: Diagnosis not present

## 2016-02-10 DIAGNOSIS — I712 Thoracic aortic aneurysm, without rupture: Secondary | ICD-10-CM | POA: Diagnosis not present

## 2016-02-10 DIAGNOSIS — E119 Type 2 diabetes mellitus without complications: Secondary | ICD-10-CM | POA: Diagnosis not present

## 2016-02-10 DIAGNOSIS — E785 Hyperlipidemia, unspecified: Secondary | ICD-10-CM | POA: Diagnosis not present

## 2016-02-12 DIAGNOSIS — M109 Gout, unspecified: Secondary | ICD-10-CM | POA: Diagnosis not present

## 2016-02-25 ENCOUNTER — Encounter: Payer: Self-pay | Admitting: Family Medicine

## 2016-02-25 ENCOUNTER — Ambulatory Visit (INDEPENDENT_AMBULATORY_CARE_PROVIDER_SITE_OTHER): Payer: Medicare Other | Admitting: Family Medicine

## 2016-02-25 ENCOUNTER — Encounter: Payer: Self-pay | Admitting: *Deleted

## 2016-02-25 VITALS — BP 114/80 | HR 88 | Temp 98.3°F | Ht 66.0 in | Wt 189.2 lb

## 2016-02-25 DIAGNOSIS — R053 Chronic cough: Secondary | ICD-10-CM

## 2016-02-25 DIAGNOSIS — I200 Unstable angina: Secondary | ICD-10-CM | POA: Diagnosis not present

## 2016-02-25 DIAGNOSIS — J84112 Idiopathic pulmonary fibrosis: Secondary | ICD-10-CM | POA: Insufficient documentation

## 2016-02-25 DIAGNOSIS — I712 Thoracic aortic aneurysm, without rupture, unspecified: Secondary | ICD-10-CM

## 2016-02-25 DIAGNOSIS — M1A00X0 Idiopathic chronic gout, unspecified site, without tophus (tophi): Secondary | ICD-10-CM

## 2016-02-25 DIAGNOSIS — M109 Gout, unspecified: Secondary | ICD-10-CM | POA: Insufficient documentation

## 2016-02-25 DIAGNOSIS — M1000 Idiopathic gout, unspecified site: Secondary | ICD-10-CM

## 2016-02-25 DIAGNOSIS — R05 Cough: Secondary | ICD-10-CM

## 2016-02-25 LAB — URIC ACID: Uric Acid, Serum: 7.4 mg/dL (ref 4.0–7.8)

## 2016-02-25 MED ORDER — KETOROLAC TROMETHAMINE 60 MG/2ML IM SOLN
60.0000 mg | Freq: Once | INTRAMUSCULAR | Status: AC
  Administered 2016-02-25: 60 mg via INTRAMUSCULAR

## 2016-02-25 MED ORDER — TRAMADOL HCL 50 MG PO TABS: 50.0000 mg | ORAL_TABLET | Freq: Three times a day (TID) | ORAL | Status: DC | PRN

## 2016-02-25 MED ORDER — PREDNISONE 20 MG PO TABS: ORAL_TABLET | ORAL | Status: DC

## 2016-02-25 MED ORDER — ALLOPURINOL 100 MG PO TABS: 100.0000 mg | ORAL_TABLET | Freq: Every day | ORAL | Status: DC

## 2016-02-25 NOTE — Assessment & Plan Note (Signed)
Likely diagnosis from past occupation exposure. Pt refused pulm referral at this time. Will let me know if worsening for referral to pulm.

## 2016-02-25 NOTE — Assessment & Plan Note (Signed)
Pt refused to stop HCTZ. Eval with uric acid repeat. Reviewed low uric acid diet.

## 2016-02-25 NOTE — Progress Notes (Signed)
Subjective:    Patient ID: Caleb Fredin Sr., male    DOB: [birth year], 82 y.o.   MRN: [id number]  HPI  82 year old male presents for follow up on chronic cough and DOE.  He has a long smoking history. Remote but for 90 pack years.  At last OV in  01/25/2016 he had abnormal spirometry showing restriction.  Chest CT: IMPRESSION: Nonspecific interstitial lung disease is present with a peripheral distribution. Comparison to the prior CT demonstrates no significant change at the lung bases. There is disease in the upper lungs as well. This is nonspecific and may represent stable idiopathic pulmonary fibrosis. He decided to try spiriva samples obtained from a friend. He refused referral to pulmonary.  Ascending aorta is 4.1 cm in caliber. Recommend annual imaging followup by CTA or MRA... He was referred to vascular MD for this.  Per pt saw MD.. Told to follow up with  vascualr MD in a year.  Today he reports: he has been using spiriva daily.  He coughs after using spiriva. Overall decrease in cough, dry. Still with shortness of breath when up doing things, but tolerable.  Gout,flare x 2 weeks: In right MCP joint, left elbow, left 2nd digit. He has been using cherry juice, mild improvement. Last week went to walk in at [place]. Given shot, and oral antiinflammatory.. Did not help. No improvement in 07/2015 with prednisone taper.  Indocin. Last uric acid 7.5  in  4/16.   Social History /Family History/Past Medical History reviewed and updated if needed. 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 Was not required to wear a mask.     Review of Systems  Constitutional: Negative for fever and fatigue.  HENT: Negative for ear pain.   Eyes: Negative for pain.  Respiratory: Positive for shortness of breath. Negative for wheezing.   Cardiovascular: Negative for chest pain and leg swelling.  Gastrointestinal: Negative for abdominal pain.  Musculoskeletal: Positive  for joint swelling.       Objective:   Physical Exam  Constitutional: Vital signs are normal. He appears well-developed and well-nourished.  E;lderly male in NAD  HENT:  Head: Normocephalic.  Right Ear: Hearing normal.  Left Ear: Hearing normal.  Nose: Nose normal.  Mouth/Throat: Oropharynx is clear and moist and mucous membranes are normal.  Neck: Trachea normal. Carotid bruit is not present. No thyroid mass and no thyromegaly present.  Cardiovascular: Normal rate, regular rhythm and normal pulses.  Exam reveals no gallop, no distant heart sounds and no friction rub.   No murmur heard. No peripheral edema  Pulmonary/Chest: Effort normal. No respiratory distress. He has rales in the right lower field and the left lower field.  Musculoskeletal:  Swelling, decreased ROM and pain, and redness in left elbow , left 2nd digit and right 1st MCP.  Skin: Skin is warm, dry and intact. No rash noted.  Psychiatric: He has a normal mood and affect. His speech is normal and behavior is normal. Thought content normal.          Assessment & Plan:

## 2016-02-25 NOTE — Assessment & Plan Note (Signed)
Followed by vascular MD.. Yearly.

## 2016-02-25 NOTE — Patient Instructions (Addendum)
Stop at lab on way out. Keep referral appointment for vascular MD Can try zyrtec for allergies. Okay to stop spiriva given not helping. Call if interested in pulmonary referral. Given anti-inflammation medication injection.  Start prednisone taper if not improving Tramadol for pain with gout.   .Low-Purine Diet Purines are compounds that affect the level of uric acid in your body. A low-purine diet is a diet that is low in purines. Eating a low-purine diet can prevent the level of uric acid in your body from getting too high and causing gout or kidney stones or both. WHAT DO I NEED TO KNOW ABOUT THIS DIET?  Choose low-purine foods. Examples of low-purine foods are listed in the next section.  Drink plenty of fluids, especially water. Fluids can help remove uric acid from your body. Try to drink 8-16 cups (1.9-3.8 L) a day.  Limit foods high in fat, especially saturated fat, as fat makes it harder for the body to get rid of uric acid. Foods high in saturated fat include pizza, cheese, ice cream, whole milk, fried foods, and gravies. Choose foods that are lower in fat and lean sources of protein. Use olive oil when cooking as it contains healthy fats that are not high in saturated fat.  Limit alcohol. Alcohol interferes with the elimination of uric acid from your body. If you are having a gout attack, avoid all alcohol.  Keep in mind that different people's bodies react differently to different foods. You will probably learn over time which foods do or do not affect you. If you discover that a food tends to cause your gout to flare up, avoid eating that food. You can more freely enjoy foods that do not cause problems. If you have any questions about a food item, talk to your dietitian or health care provider. WHICH FOODS ARE LOW, MODERATE, AND HIGH IN PURINES? The following is a list of foods that are low, moderate, and high in purines. You can eat any amount of the foods that are low in  purines. You may be able to have small amounts of foods that are moderate in purines. Ask your health care provider how much of a food moderate in purines you can have. Avoid foods high in purines. Grains  Foods low in purines: Enriched white bread, pasta, rice, cake, cornbread, popcorn.  Foods moderate in purines: Whole-grain breads and cereals, wheat germ, bran, oatmeal. Uncooked oatmeal. Dry wheat bran or wheat germ.  Foods high in purines: Pancakes, [place] toast, biscuits, muffins. Vegetables  Foods low in purines: All vegetables, except those that are moderate in purines.  Foods moderate in purines: Asparagus, cauliflower, spinach, mushrooms, green peas. Fruits  All fruits are low in purines. Meats and other Protein Foods  Foods low in purines: Eggs, nuts, peanut butter.  Foods moderate in purines: 80-90% lean beef, lamb, veal, pork, poultry, fish, eggs, peanut butter, nuts. Crab, lobster, oysters, and shrimp. Cooked dried beans, peas, and lentils.  Foods high in purines: Anchovies, sardines, herring, mussels, tuna, codfish, scallops, trout, and haddock. Berniece Salines. Organ meats (such as liver or kidney). Tripe. Game meat. Goose. Sweetbreads. Dairy  All dairy foods are low in purines. Low-fat and fat-free dairy products are best because they are low in saturated fat. Beverages  Drinks low in purines: Water, carbonated beverages, tea, coffee, cocoa.  Drinks moderate in purines: Soft drinks and other drinks sweetened with high-fructose corn syrup. Juices. To find whether a food or drink is sweetened with high-fructose corn syrup,  look at the ingredients list.  Drinks high in purines: Alcoholic beverages (such as beer). Condiments  Foods low in purines: Salt, herbs, olives, pickles, relishes, vinegar.  Foods moderate in purines: Butter, margarine, oils, mayonnaise. Fats and Oils  Foods low in purines: All types, except gravies and sauces made with meat.  Foods high in purines:  Gravies and sauces made with meat. Other Foods  Foods low in purines: Sugars, sweets, gelatin. Cake. Soups made without meat.  Foods moderate in purines: Meat-based or fish-based soups, broths, or bouillons. Foods and drinks sweetened with high-fructose corn syrup.  Foods high in purines: High-fat desserts (such as ice cream, cookies, cakes, pies, doughnuts, and chocolate). Contact your dietitian for more information on foods that are not listed here.   This information is not intended to replace advice given to you by your health care provider. Make sure you discuss any questions you have with your health care provider.   Document Released: 02/10/2011 Document Revised: 10/21/2013 Document Reviewed: 09/22/2013 Elsevier Interactive Patient Education [employer].

## 2016-02-25 NOTE — Assessment & Plan Note (Signed)
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  Spiriva has not helped. Pt not interested in referral to [facility] at this time.

## 2016-02-25 NOTE — Addendum Note (Signed)
Addended by: Carter Kitten on: 02/25/2016 10:25 AM   Modules accepted: Orders

## 2016-02-25 NOTE — Assessment & Plan Note (Signed)
Treat with toradol injection. Pt on cherry juice. Refuses other gout specific med given cost.  Will use tramadol for pain. If not improving will fill Rx for prednsione.

## 2016-02-25 NOTE — Addendum Note (Signed)
Addended by: Carter Kitten on: 02/25/2016 04:21 PM   Modules accepted: Orders

## 2016-02-25 NOTE — Progress Notes (Signed)
Pre visit review using our clinic review tool, if applicable. No additional management support is needed unless otherwise documented below in the visit note.

## 2016-03-27 DIAGNOSIS — J208 Acute bronchitis due to other specified organisms: Secondary | ICD-10-CM | POA: Diagnosis not present

## 2016-03-27 DIAGNOSIS — Z8709 Personal history of other diseases of the respiratory system: Secondary | ICD-10-CM | POA: Diagnosis not present

## 2016-04-07 ENCOUNTER — Encounter: Payer: Self-pay | Admitting: Family Medicine

## 2016-04-07 ENCOUNTER — Ambulatory Visit (INDEPENDENT_AMBULATORY_CARE_PROVIDER_SITE_OTHER): Payer: PPO | Admitting: Family Medicine

## 2016-04-07 VITALS — BP 100/66 | HR 74 | Temp 97.3°F | Ht 66.0 in | Wt 183.5 lb

## 2016-04-07 DIAGNOSIS — M1000 Idiopathic gout, unspecified site: Secondary | ICD-10-CM

## 2016-04-07 DIAGNOSIS — M1A00X1 Idiopathic chronic gout, unspecified site, with tophus (tophi): Secondary | ICD-10-CM

## 2016-04-07 MED ORDER — COLCHICINE 0.6 MG PO TABS: ORAL_TABLET | ORAL | Status: DC

## 2016-04-07 MED ORDER — METHYLPREDNISOLONE ACETATE 40 MG/ML IJ SUSP
40.0000 mg | Freq: Once | INTRAMUSCULAR | Status: AC
  Administered 2016-04-07: 40 mg via INTRAMUSCULAR

## 2016-04-07 MED ORDER — PREDNISONE 20 MG PO TABS: ORAL_TABLET | ORAL | Status: DC

## 2016-04-07 NOTE — Patient Instructions (Addendum)
Stop HCTZ. Follow BP at home, call if greater than 140/90. Given shot of steroid.  Complete prednisone taper. Can start colchicine twice daily. Hold cholesterol medication while on this. Work on low uric acid diet.  If your gout flare resolves and you want to try allopurinol to prevent gout flares, let me know.

## 2016-04-07 NOTE — Progress Notes (Signed)
Pre visit review using our clinic review tool, if applicable. No additional management support is needed unless otherwise documented below in the visit note.

## 2016-04-07 NOTE — Assessment & Plan Note (Addendum)
Treat with steroid injection and pred taper as well as pt now has insurance that may cover colchicine. Tramadol for breakthrough pain.

## 2016-04-07 NOTE — Assessment & Plan Note (Signed)
Pt now agreeable to stop HCTZ.. Likely BP will remain in control.  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

## 2016-04-07 NOTE — Progress Notes (Signed)
   Subjective:    Patient ID: Caleb Rominger Sr., male    DOB: September 25, 1934, 82 y.o.   MRN: [id number]  HPI   82 year old male presents for gout flare.  He has been having gout flares increase in frequency more recently. Now ongoing flare continuing x 5 weeks.  He reports he is continuing to have pain in left great as well as right great toe toe, has never gone away. Uses tramadol for pain occ.  low uric acid diet..Using but not successfully.   In past he could not afford gout specific med and refused to stop HCTZ despite possibility of triggering gout. At last check uric acid was only minimaly elevated.  Lab Results  Component Value Date   LABURIC 7.4 02/25/2016   BP Readings from Last 3 Encounters:  04/07/16 100/66  02/25/16 114/80  01/25/16 110/64      Social History /Family History/Past Medical History reviewed and updated if needed.    Review of Systems  Constitutional: Negative for fatigue.  HENT: Negative for ear pain.   Eyes: Negative for pain.  Respiratory: Positive for cough.   Cardiovascular: Negative for chest pain.       Objective:   Physical Exam  Constitutional: Vital signs are normal. He appears well-developed and well-nourished.  HENT:  Head: Normocephalic.  Right Ear: Hearing normal.  Left Ear: Hearing normal.  Nose: Nose normal.  Mouth/Throat: Oropharynx is clear and moist and mucous membranes are normal.  Neck: Trachea normal. Carotid bruit is not present. No thyroid mass and no thyromegaly present.  Cardiovascular: Normal rate, regular rhythm and normal pulses.  Exam reveals no gallop, no distant heart sounds and no friction rub.   No murmur heard. No peripheral edema  Pulmonary/Chest: Effort normal and breath sounds normal. No respiratory distress.  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 Bilateral 2nd digits with nodules, not clearly tophus versus heberden's nodules  Skin:  Skin is warm, dry and intact. No rash noted.  Psychiatric: He has a normal mood and affect. His speech is normal and behavior is normal. Thought content normal.          Assessment & Plan:

## 2016-06-06 DIAGNOSIS — H5203 Hypermetropia, bilateral: Secondary | ICD-10-CM | POA: Diagnosis not present

## 2016-07-23 ENCOUNTER — Other Ambulatory Visit: Payer: Self-pay | Admitting: Family Medicine

## 2016-07-23 NOTE — Telephone Encounter (Signed)
Last office visit 04/07/16.  HCTZ not on current medication list.  Refill?

## 2016-07-30 ENCOUNTER — Other Ambulatory Visit: Payer: Self-pay | Admitting: Family Medicine

## 2016-09-14 ENCOUNTER — Encounter: Payer: Self-pay | Admitting: Family Medicine

## 2016-09-14 ENCOUNTER — Ambulatory Visit (INDEPENDENT_AMBULATORY_CARE_PROVIDER_SITE_OTHER): Payer: PPO | Admitting: Family Medicine

## 2016-09-14 VITALS — BP 140/76 | HR 70 | Temp 98.3°F | Ht 66.0 in | Wt 195.0 lb

## 2016-09-14 DIAGNOSIS — M25511 Pain in right shoulder: Secondary | ICD-10-CM | POA: Diagnosis not present

## 2016-09-14 DIAGNOSIS — R200 Anesthesia of skin: Secondary | ICD-10-CM

## 2016-09-14 DIAGNOSIS — R202 Paresthesia of skin: Secondary | ICD-10-CM

## 2016-09-14 DIAGNOSIS — M25512 Pain in left shoulder: Secondary | ICD-10-CM

## 2016-09-14 DIAGNOSIS — G8929 Other chronic pain: Secondary | ICD-10-CM

## 2016-09-14 NOTE — Assessment & Plan Note (Signed)
Likely carpal tunnel. Start with night brace. Consider nerve conduction/further eval with if not improving.

## 2016-09-14 NOTE — Assessment & Plan Note (Signed)
Not clearly from neck but pt with arthritis and decreased mobility of neck. No improvement with OTC NSAIDs, temporary relief with Rx NSAIDs,not ideal to use in this pt with comorbidity.  Refer to ORTHo for further eval and possible steroid injection.

## 2016-09-14 NOTE — Progress Notes (Signed)
Pre visit review using our clinic review tool, if applicable. No additional management support is needed unless otherwise documented below in the visit note. 

## 2016-09-14 NOTE — Patient Instructions (Addendum)
Stopat front desk to set up referral to [facility].  Wear wrist brace at night x 2-4 weeks.  Call if not improving as expected.

## 2016-09-14 NOTE — Progress Notes (Signed)
Subjective:    Patient ID: Caleb Slomski Sr., male    DOB: September 23, 1934, 82 y.o.   MRN: [id number]  HPI    82 yea rolf male presents with bilateral shoulder pain, chronic. Also having new onset numbness in right fingers., tingling x 1-2 months. Cannot sleep on right side.. Because it get tingling then numb. Mild neck pain,decreased mobility in neck. Feels grinding in neck.  Moving neck does not trigger numbness.  Has to shake had to get sensation back some. Feels like right hand somewhat weak.   In past  Bilateral shoulder pain felt due to arthritis in [facility] joints bilaterally seen on CXR.  No benefit with home PT, OTC NSAIDS aleve. Was better temporarily with dicofenac , then pain returned. Decreased mobility of neck but no pain   Review of Systems  Constitutional: Negative for fatigue.  HENT: Negative for ear pain.   Eyes: Negative for pain.  Respiratory: Negative for shortness of breath.   Cardiovascular: Negative for chest pain.  Musculoskeletal: Positive for arthralgias.       Objective:   Physical Exam  Constitutional: Vital signs are normal. He appears well-developed and well-nourished.  HENT:  Head: Normocephalic.  Right Ear: Hearing normal.  Left Ear: Hearing normal.  Nose: Nose normal.  Mouth/Throat: Oropharynx is clear and moist and mucous membranes are normal.  Neck: Trachea normal. Carotid bruit is not present. No thyroid mass and no thyromegaly present.  Cardiovascular: Normal rate, regular rhythm and normal pulses.  Exam reveals no gallop, no distant heart sounds and no friction rub.   No murmur heard. No peripheral edema  Pulmonary/Chest: Effort normal and breath sounds normal. No respiratory distress.  Musculoskeletal:       Right shoulder: He exhibits decreased range of motion, tenderness, bony tenderness and deformity.       Left shoulder: He exhibits decreased range of motion, tenderness, bony tenderness and deformity.       Right wrist: He exhibits  decreased range of motion. He exhibits no tenderness, no bony tenderness and no swelling.       Cervical back: He exhibits decreased range of motion. He exhibits no tenderness and no bony tenderness.  Neg spurling test bialterally  neg tinel and phalen, but pt unable to bend wrist with arthritis.  Skin: Skin is warm, dry and intact. No rash noted.  Psychiatric: He has a normal mood and affect. His speech is normal and behavior is normal. Thought content normal.          Assessment & Plan:

## 2016-09-27 DIAGNOSIS — L820 Inflamed seborrheic keratosis: Secondary | ICD-10-CM | POA: Diagnosis not present

## 2016-09-27 DIAGNOSIS — L570 Actinic keratosis: Secondary | ICD-10-CM | POA: Diagnosis not present

## 2016-09-29 ENCOUNTER — Ambulatory Visit (INDEPENDENT_AMBULATORY_CARE_PROVIDER_SITE_OTHER): Payer: PPO

## 2016-09-29 VITALS — BP 124/78 | HR 80 | Temp 97.9°F | Ht 64.25 in | Wt 191.8 lb

## 2016-09-29 DIAGNOSIS — E538 Deficiency of other specified B group vitamins: Secondary | ICD-10-CM

## 2016-09-29 DIAGNOSIS — R7309 Other abnormal glucose: Secondary | ICD-10-CM | POA: Diagnosis not present

## 2016-09-29 DIAGNOSIS — E7800 Pure hypercholesterolemia, unspecified: Secondary | ICD-10-CM | POA: Diagnosis not present

## 2016-09-29 DIAGNOSIS — Z0000 Encounter for general adult medical examination without abnormal findings: Secondary | ICD-10-CM

## 2016-09-29 DIAGNOSIS — Z125 Encounter for screening for malignant neoplasm of prostate: Secondary | ICD-10-CM

## 2016-09-29 LAB — LIPID PANEL
CHOLESTEROL: 113 mg/dL (ref 0–200)
HDL: 49.7 mg/dL (ref >39.00)
LDL Cholesterol: 51 mg/dL (ref 0–99)
NonHDL: 63.61
TRIGLYCERIDES: 63 mg/dL (ref 0.0–149.0)
Total CHOL/HDL Ratio: 2
VLDL: 12.6 mg/dL (ref 0.0–40.0)

## 2016-09-29 LAB — PSA, MEDICARE: PSA: 0.45 ng/ml (ref 0.10–4.00)

## 2016-09-29 LAB — COMPREHENSIVE METABOLIC PANEL
ALBUMIN: 4.3 g/dL (ref 3.5–5.2)
ALK PHOS: 71 U/L (ref 39–117)
ALT: 12 U/L (ref 0–53)
AST: 18 U/L (ref 0–37)
BUN: 17 mg/dL (ref 6–23)
CO2: 33 mEq/L — ABNORMAL HIGH (ref 19–32)
CREATININE: 1.45 mg/dL (ref 0.40–1.50)
Calcium: 9.4 mg/dL (ref 8.4–10.5)
Chloride: 99 mEq/L (ref 96–112)
GFR: 49.67 mL/min — ABNORMAL LOW (ref >60.00)
GLUCOSE: 86 mg/dL (ref 70–99)
POTASSIUM: 4 meq/L (ref 3.5–5.1)
SODIUM: 141 meq/L (ref 135–145)
TOTAL PROTEIN: 7.2 g/dL (ref 6.0–8.3)
Total Bilirubin: 0.7 mg/dL (ref 0.2–1.2)

## 2016-09-29 LAB — MICROALBUMIN / CREATININE URINE RATIO
CREATININE, U: 58.7 mg/dL
MICROALB/CREAT RATIO: 1.2 mg/g (ref 0.0–30.0)
Microalb, Ur: <0.7 mg/dL (ref 0.0–1.9)

## 2016-09-29 LAB — VITAMIN B12: VITAMIN B 12: 88 pg/mL — AB (ref 211–911)

## 2016-09-29 LAB — HEMOGLOBIN A1C: HEMOGLOBIN A1C: 6.8 % — AB (ref 4.6–6.5)

## 2016-09-29 NOTE — Progress Notes (Signed)
Pre visit review using our clinic review tool, if applicable. No additional management support is needed unless otherwise documented below in the visit note.

## 2016-09-29 NOTE — Patient Instructions (Signed)
Caleb Bryant , Thank you for taking time to come for your Medicare Wellness Visit. I appreciate your ongoing commitment to your health goals. Please review the following plan we discussed and let me know if I can assist you in the future.   These are the goals we discussed: Goals    . physical          Starting 09/29/2016, I will continue to do yard work 2-3 days per week as weather permits.        This is a list of the screening recommended for you and due dates:  Health Maintenance  Topic Date Due  . Shingles Vaccine  09/29/2017*  . Complete foot exam   10/13/2016  . Hemoglobin A1C  03/30/2017  . Eye exam for diabetics  07/30/2017  . Urine Protein Check  09/29/2017  . Tetanus Vaccine  05/13/2020  . Flu Shot  Completed  . Pneumonia vaccines  Completed  *Topic was postponed. The date shown is not the original due date.   Preventive Care for Adults  A healthy lifestyle and preventive care can promote health and wellness. Preventive health guidelines for adults include the following key practices.  . A routine yearly physical is a good way to check with your health care provider about your health and preventive screening. It is a chance to share any concerns and updates on your health and to receive a thorough exam.  . Visit your dentist for a routine exam and preventive care every 6 months. Brush your teeth twice a day and floss once a day. Good oral hygiene prevents tooth decay and gum disease.  . The frequency of eye exams is based on your age, health, family medical history, use  of contact lenses, and other factors. Follow your health care provider's ecommendations for frequency of eye exams.  . Eat a healthy diet. Foods like vegetables, fruits, whole grains, low-fat dairy products, and lean protein foods contain the nutrients you need without too many calories. Decrease your intake of foods high in solid fats, added sugars, and salt. Eat the right amount of calories for you.  Get information about a proper diet from your health care provider, if necessary.  . Regular physical exercise is one of the most important things you can do for your health. Most adults should get at least 150 minutes of moderate-intensity exercise (any activity that increases your heart rate and causes you to sweat) each week. In addition, most adults need muscle-strengthening exercises on 2 or more days a week.  Silver Sneakers may be a benefit available to you. To determine eligibility, you may visit the website: www.silversneakers.com or contact program at 9171231240 Mon-Fri between 8AM-8PM.   . Maintain a healthy weight. The body mass index (BMI) is a screening tool to identify possible weight problems. It provides an estimate of body fat based on height and weight. Your health care provider can find your BMI and can help you achieve or maintain a healthy weight.   For adults 20 years and older: ? A BMI below 18.5 is considered underweight. ? A BMI of 18.5 to 24.9 is normal. ? A BMI of 25 to 29.9 is considered overweight. ? A BMI of 30 and above is considered obese.   . Maintain normal blood lipids and cholesterol levels by exercising and minimizing your intake of saturated fat. Eat a balanced diet with plenty of fruit and vegetables. Blood tests for lipids and cholesterol should begin at age 39 and be  repeated every 5 years. If your lipid or cholesterol levels are high, you are over 50, or you are at high risk for heart disease, you may need your cholesterol levels checked more frequently. Ongoing high lipid and cholesterol levels should be treated with medicines if diet and exercise are not working.  . If you smoke, find out from your health care provider how to quit. If you do not use tobacco, please do not start.  . If you choose to drink alcohol, please do not consume more than 2 drinks per day. One drink is considered to be 12 ounces (355 mL) of beer, 5 ounces (148 mL) of wine, or  1.5 ounces (44 mL) of liquor.  . If you are 13-81 years old, ask your health care provider if you should take aspirin to prevent strokes.  . Use sunscreen. Apply sunscreen liberally and repeatedly throughout the day. You should seek shade when your shadow is shorter than you. Protect yourself by wearing long sleeves, pants, a wide-brimmed hat, and sunglasses year round, whenever you are outdoors.  . Once a month, do a whole body skin exam, using a mirror to look at the skin on your back. Tell your health care provider of new moles, moles that have irregular borders, moles that are larger than a pencil eraser, or moles that have changed in shape or color.

## 2016-09-29 NOTE — Progress Notes (Signed)
PCP notes:   Health maintenance:  Shingles - declined/financial A1C - completed Urine microalbumin - completed Eye exam - per pt, exam in Oct 2017  Abnormal screenings:   Hearing - failed  Patient concerns:   None  Nurse concerns:  None  Next PCP appt:   10/04/16@ 1000

## 2016-09-29 NOTE — Progress Notes (Signed)
Subjective:   Caleb Murri Borden Sr. is a 82 y.o. male who presents for Medicare Annual/Subsequent preventive examination.  Review of Systems:  N/A Cardiac Risk Factors include: advanced age (>14mn, >>50women);male gender;obesity (BMI >30kg/m2);diabetes mellitus;dyslipidemia;hypertension     Objective:    Vitals: BP 124/78 (BP Location: Left Arm, Patient Position: Sitting, Cuff Size: Normal)   Pulse 80   Temp 97.9 F (36.6 C) (Oral)   Ht 5' 4.25" (1.632 m) Comment: no shoes  Wt 191 lb 12 oz (87 kg)   SpO2 96%   BMI 32.66 kg/m   Body mass index is 32.66 kg/m.  Tobacco History  Smoking Status  . Former Smoker  . Packs/day: 3.00  . Years: 30.00  . Types: Cigarettes  Smokeless Tobacco  . Former [employer] developer . Types: Chew    Comment: 11/04/2013 "quit smoking in the 1980's or so; quit chewing before I quit smoking"     Counseling given: No   Past Medical History:  Diagnosis Date  . Anxiety   . [place]    "just dx'd today" (11/04/2013)  . Benign prostatic hypertrophy   . Borderline diabetes   . Colon, diverticulosis   . Exertional shortness of breath   . Gout   . Hyperlipidemia   . Hypertension   . Osteoarthritis    "all over" (11/04/2013)  . PVD (peripheral vascular disease) ([place]    Past Surgical History:  Procedure Laterality Date  . albation  11/04/13  . ANKLE FRACTURE SURGERY Right   . APPENDECTOMY  1997   "days before 1st colon OR"  . ATRIAL FLUTTER ABLATION N/A 11/26/2013   Procedure: ATRIAL FLUTTER ABLATION;  Surgeon: GEvans Lance MD;  [facility];  Service: Cardiovascular;  Laterality: N/A;  . CATARACT EXTRACTION, BILATERAL Bilateral   . CHOLECYSTECTOMY  12/03  . COLON SURGERY     "diverticulitis"  . COLONOSCOPY WITH ESOPHAGOGASTRODUODENOSCOPY (EGD)  12/00   gastic polyp   . COLONOSCOPY WITH ESOPHAGOGASTRODUODENOSCOPY (EGD)  11/07,12/2011   gastritis, 2 gastic polyps  . COLOSTOMY  1997  . COLOSTOMY REVERSAL     "think I wore it 4  months" (11/04/2013)  . CORNEAL TRANSPLANT Right   . CORONARY ANGIOGRAM  11/05/2013   Procedure: CORONARY ANGIOGRAM;  Surgeon: MWellington Hampshire MD;  [facility];  Service: Cardiovascular;;  . HERNIA REPAIR  11/05/14   ventral hernia  . INGUINAL HERNIA REPAIR Left   . PARTIAL COLECTOMY  1997   "diverticulitis; busted; Byrnett"  . stress cardiolite negative  9/02   EF 60%  . TONSILLECTOMY AND ADENOIDECTOMY     child  . VENTRAL HERNIA REPAIR  11/07   Byrnett   Family History  Problem Relation Age of Onset  . Coronary artery disease Sister   . Heart failure Mother    History  Sexual Activity  . Sexual activity: No    Outpatient Encounter Prescriptions as of 09/29/2016  Medication Sig  . atorvastatin (LIPITOR) 20 MG tablet Take 1 tablet (20 mg total) by mouth daily.  .Marland KitchenCINNAMON PO Take 1 tablet by mouth daily.  . hydrochlorothiazide (HYDRODIURIL) 25 MG tablet TAKE 1 TABLET(25 MG) BY MOUTH DAILY   No facility-administered encounter medications on file as of 09/29/2016.     Activities of Daily Living In your present state of health, do you have any difficulty performing the following activities: 09/29/2016  Hearing? Y  Vision? Y  Difficulty concentrating or making decisions? Y  Walking or  climbing stairs? N  Dressing or bathing? N  Doing errands, shopping? N  Preparing Food and eating ? N  Using the Toilet? N  In the past six months, have you accidently leaked urine? Y  Do you have problems with loss of bowel control? N  Managing your Medications? N  Managing your Finances? N  Housekeeping or managing your Housekeeping? N  Some recent data might be hidden    Patient Care Team: [name], MD as PCP - General Robert Bellow, MD (General Surgery) Venia Carbon, MD as Referring Physician (Internal Medicine) Agapito Games as Consulting Physician (Optometry)   Assessment:     Hearing Screening   125Hz 250Hz 500Hz 1000Hz 2000Hz 3000Hz 4000Hz 6000Hz  8000Hz  Right ear:   40 40 0  0    Left ear:   0 0 0  0    Vision Screening Comments: Last vision exam in Oct 2017   Exercise Activities and Dietary recommendations Current Exercise Habits: The patient does not participate in regular exercise at present (pt does yard work 2-3 days per week as weather permits), Exercise limited by: None identified  Goals    . physical          Starting 09/29/2016, I will continue to do yard work 2-3 days per week as weather permits.       Fall Risk Fall Risk  09/29/2016 10/14/2015 06/22/2014  Falls in the past year? No Yes No  Number falls in past yr: - 1 -  Injury with Fall? - No -   Depression Screen PHQ 2/9 Scores 09/29/2016 10/14/2015 06/22/2014  PHQ - 2 Score 0 0 0    Cognitive Function MMSE - Mini Mental State Exam 09/29/2016  Orientation to time 5  Orientation to Place 5  Registration 3  Attention/ Calculation 0  Recall 3  Language- name 2 objects 0  Language- repeat 1  Language- follow 3 step command 3  Language- read & follow direction 0  Write a sentence 0  Copy design 0  Total score 20       PLEASE NOTE: A Mini-Cog screen was completed. Maximum score is 20. A value of 0 denotes this part of Folstein MMSE was not completed or the patient failed this part of the Mini-Cog screening.   Mini-Cog Screening Orientation to Time - Max 5 pts Orientation to Place - Max 5 pts Registration - Max 3 pts Recall - Max 3 pts Language Repeat - Max 1 pts Language Follow 3 Step Command - Max 3 pts   Immunization History  Administered Date(s) Administered  . Influenza Split 07/31/2011, 08/08/2012, 09/01/2013  . Influenza Whole 10/05/2004, 08/16/2007, 08/11/2008, 08/06/2009  . Influenza, High Dose Seasonal PF 07/27/2016  . Influenza,inj,Quad PF,36+ Mos 06/22/2014, 10/14/2015  . Pneumococcal Conjugate-13 06/22/2014  . Pneumococcal Polysaccharide-23 04/14/2009  . Td 10/31/1997, 05/13/2010   Screening Tests Health Maintenance  Topic Date  Due  . ZOSTAVAX  09/29/2017 (Originally 12/16/1995)  . FOOT EXAM  10/13/2016  . HEMOGLOBIN A1C  03/30/2017  . OPHTHALMOLOGY EXAM  07/30/2017  . URINE MICROALBUMIN  09/29/2017  . TETANUS/TDAP  05/13/2020  . INFLUENZA VACCINE  Completed  . PNA vac Low Risk Adult  Completed      Plan:     I have personally reviewed and addressed the Medicare Annual Wellness questionnaire and have noted the following in the patient's chart:  A. Medical and social history B. Use of alcohol, tobacco or illicit drugs  C.  Current medications and supplements D. Functional ability and status E.  Nutritional status F.  Physical activity G. Advance directives H. List of other physicians I.  Hospitalizations, surgeries, and ER visits in previous 12 months J.  [place] to include hearing, vision, cognitive, depression L. Referrals and appointments - none  In addition, I have reviewed and discussed with patient certain preventive protocols, quality metrics, and best practice recommendations. A written personalized care plan for preventive services as well as general preventive health recommendations were provided to patient.  See attached scanned questionnaire for additional information.   Signed,   Lindell Noe, MHA, BS, LPN Health Coach

## 2016-10-03 NOTE — Progress Notes (Signed)
I reviewed health advisor's note, was available for consultation, and agree with documentation and plan.  Amy Bedsole, MD [facility] [place]  

## 2016-10-04 ENCOUNTER — Ambulatory Visit (INDEPENDENT_AMBULATORY_CARE_PROVIDER_SITE_OTHER): Payer: PPO | Admitting: Family Medicine

## 2016-10-04 ENCOUNTER — Encounter: Payer: Self-pay | Admitting: Family Medicine

## 2016-10-04 DIAGNOSIS — J84112 Idiopathic pulmonary fibrosis: Secondary | ICD-10-CM

## 2016-10-04 DIAGNOSIS — M1A00X1 Idiopathic chronic gout, unspecified site, with tophus (tophi): Secondary | ICD-10-CM

## 2016-10-04 DIAGNOSIS — E1351 Other specified diabetes mellitus with diabetic peripheral angiopathy without gangrene: Secondary | ICD-10-CM

## 2016-10-04 DIAGNOSIS — I5022 Chronic systolic (congestive) heart failure: Secondary | ICD-10-CM

## 2016-10-04 DIAGNOSIS — F0390 Unspecified dementia without behavioral disturbance: Secondary | ICD-10-CM

## 2016-10-04 DIAGNOSIS — I10 Essential (primary) hypertension: Secondary | ICD-10-CM

## 2016-10-04 DIAGNOSIS — I712 Thoracic aortic aneurysm, without rupture, unspecified: Secondary | ICD-10-CM

## 2016-10-04 DIAGNOSIS — M79601 Pain in right arm: Secondary | ICD-10-CM | POA: Diagnosis not present

## 2016-10-04 DIAGNOSIS — N183 Chronic kidney disease, stage 3 (moderate): Secondary | ICD-10-CM

## 2016-10-04 DIAGNOSIS — M19011 Primary osteoarthritis, right shoulder: Secondary | ICD-10-CM | POA: Diagnosis not present

## 2016-10-04 DIAGNOSIS — E7800 Pure hypercholesterolemia, unspecified: Secondary | ICD-10-CM

## 2016-10-04 DIAGNOSIS — E538 Deficiency of other specified B group vitamins: Secondary | ICD-10-CM | POA: Diagnosis not present

## 2016-10-04 DIAGNOSIS — F03A0 Unspecified dementia, mild, without behavioral disturbance, psychotic disturbance, mood disturbance, and anxiety: Secondary | ICD-10-CM

## 2016-10-04 DIAGNOSIS — E1340 Other specified diabetes mellitus with diabetic neuropathy, unspecified: Secondary | ICD-10-CM

## 2016-10-04 DIAGNOSIS — E1122 Type 2 diabetes mellitus with diabetic chronic kidney disease: Secondary | ICD-10-CM

## 2016-10-04 DIAGNOSIS — M19012 Primary osteoarthritis, left shoulder: Secondary | ICD-10-CM | POA: Diagnosis not present

## 2016-10-04 DIAGNOSIS — M79602 Pain in left arm: Secondary | ICD-10-CM | POA: Diagnosis not present

## 2016-10-04 DIAGNOSIS — M4722 Other spondylosis with radiculopathy, cervical region: Secondary | ICD-10-CM | POA: Diagnosis not present

## 2016-10-04 MED ORDER — CYANOCOBALAMIN 1000 MCG/ML IJ SOLN
1000.0000 ug | Freq: Once | INTRAMUSCULAR | Status: AC
  Administered 2016-10-04: 1000 ug via INTRAMUSCULAR

## 2016-10-04 MED ORDER — GABAPENTIN 100 MG PO CAPS: 100.0000 mg | ORAL_CAPSULE | Freq: Every day | ORAL | 3 refills | Status: DC

## 2016-10-04 NOTE — Addendum Note (Signed)
Addended by: Carter Kitten on: 10/04/2016 11:01 AM   Modules accepted: Orders

## 2016-10-04 NOTE — Assessment & Plan Note (Signed)
Followed by Dr Freda Munro Vascular.

## 2016-10-04 NOTE — Assessment & Plan Note (Signed)
Euvolemic on HCTZ.

## 2016-10-04 NOTE — Assessment & Plan Note (Signed)
Well controlled. Continue current medication. Encouraged exercise, weight loss, healthy eating habits.

## 2016-10-04 NOTE — Assessment & Plan Note (Addendum)
Low . Needs to replete.. Will given inj 1000 mcg today as well as restart oral as he does not like to come in for injections. This may also be contributing to his numbness in multiple areas.

## 2016-10-04 NOTE — Assessment & Plan Note (Signed)
Well controlled. Continue current medication.  

## 2016-10-04 NOTE — Progress Notes (Signed)
Subjective:    Patient ID: Caleb Bamba Sr., male    DOB: 04-28-1934, 82 y.o.   MRN: [id number]  HPI The patient is here for annual wellness exam and preventative care.   Earlier  On 09/29/16 he saw Candis Musa, LPN for medicare wellness. Note reviewed in detail. Health maintenance: Shingles - declined/financial A1C - completed Urine microalbumin - completed Eye exam - per pt, exam in Oct 2017  Abnormal screenings: Hearing - failed  Patient concerns: None  TODAY:  He has appt today with ORTHO for shoulder pain.  Wrist brace for carpal ineffective.  Chronic gout:  Flares  1-2   In last year. HAs stopped animal gizzards. On no preventative. Lab Results  Component Value Date   LABURIC 7.4 02/25/2016     Hypertension:    Good control on HCTZ BP Readings from Last 3 Encounters:  10/04/16 130/76  09/29/16 124/78  09/14/16 140/76  Using medication without problems or lightheadedness:  none Chest pain with exertion: none Edema: None Short of breath: yes Average home BPs: Other issues:  Diabetes:   Good control with diet Lab Results  Component Value Date   HGBA1C 6.8 (H) 09/29/2016  Using medications without difficulties: Hypoglycemic episodes: Hyperglycemic episodes: Feet problems: he has sharp pains in feet and tingling in feet.. DM neuropathy. Bothers him the most at night. Blood Sugars averaging: eye exam within last year:  Elevated Cholesterol:  LDL at goal < 70 on lipitor 20 mg daily Lab Results  Component Value Date   CHOL 113 09/29/2016   HDL 49.70 09/29/2016   LDLCALC 51 09/29/2016   TRIG 63.0 09/29/2016   CHOLHDL 2 09/29/2016  Using medications without problems: Muscle aches:  Diet compliance: Goofd Exercise: Walking daily Wt Readings from Last 3 Encounters:  10/04/16 194 lb 4 oz (88.1 kg)  09/29/16 191 lb 12 oz (87 kg)  09/14/16 195 lb (88.5 kg)  Other complaints:  Vit B12 low:  He had forgotten B12 pills. Will restart.  PVD  secondary to DM: asymptomatic on no meds.  CHF, followed by Dr. Fletcher Anon.  Pt is euvolemic today. Wt Readings from Last 3 Encounters:  10/04/16 194 lb 4 oz (88.1 kg)  09/29/16 191 lb 12 oz (87 kg)  09/14/16 195 lb (88.5 kg)   Idiopathic pulmonary fibrosis: stable SOB with walking.  Social History /Family History/Past Medical History reviewed and updated if needed. Patient Care Team: [name], MD as PCP - General Robert Bellow, MD (General Surgery) Venia Carbon, MD as Referring Physician (Internal Medicine) Agapito Games as Consulting Physician (Optometry)    Dr. Fletcher Anon Cards   Dr. Freda Munro  Vascular ORTHO pending  Review of Systems  Constitutional: Negative for fatigue and fever.  HENT: Negative for ear pain.   Eyes: Negative for pain.  Respiratory: Negative for cough and shortness of breath.   Cardiovascular: Negative for chest pain, palpitations and leg swelling.  Gastrointestinal: Negative for abdominal pain.  Genitourinary: Negative for dysuria.  Musculoskeletal: Negative for arthralgias.  Neurological: Negative for syncope, light-headedness and headaches.  Psychiatric/Behavioral: Negative for dysphoric mood.       Objective:   Physical Exam  Constitutional: Vital signs are normal. He appears well-developed and well-nourished.  HENT:  Head: Normocephalic.  Right Ear: Hearing normal.  Left Ear: Hearing normal.  Nose: Nose normal.  Mouth/Throat: Oropharynx is clear and moist and mucous membranes are normal.  Neck: Trachea normal. Carotid bruit is not present. No thyroid mass and no  thyromegaly present.  Cardiovascular: Normal rate, regular rhythm and normal pulses.  Exam reveals no gallop, no distant heart sounds and no friction rub.   No murmur heard. No peripheral edema  Pulmonary/Chest: Effort normal and breath sounds normal. No respiratory distress.  Skin: Skin is warm, dry and intact. No rash noted.  Psychiatric: He has a normal mood and affect.  His speech is normal and behavior is normal. Thought content normal.   Diabetic foot exam: Normal inspection No skin breakdown  multiple calluses  Normal DP pulses decreased sensation to light touch and monofilament Nails normal        Assessment & Plan:

## 2016-10-04 NOTE — Patient Instructions (Addendum)
Start  Baby aspirin 81 mg daily. Start gabapentin for neuropathy in feet at night.  Restart B12 tabs  1000 mcg daily.

## 2016-10-04 NOTE — Assessment & Plan Note (Addendum)
Stable, pt not interested in medicaiton. Pt did well on AMW MMSE per Benita Stabile, LPN.

## 2016-10-04 NOTE — Assessment & Plan Note (Signed)
Stable control. Not on ACE. Microalbumin nml.

## 2016-10-04 NOTE — Assessment & Plan Note (Signed)
Start gabapentin to treat.. May need to titrate up. Pt will follow up in 4 weeks.

## 2016-10-04 NOTE — Assessment & Plan Note (Signed)
No current symptoms of claudication on no mediciation. Pt encouraged to use ASA81 mg dialy

## 2016-10-04 NOTE — Assessment & Plan Note (Signed)
Stable on no med, diet control.

## 2016-10-04 NOTE — Assessment & Plan Note (Signed)
Stable SOb.. If progressive will return to see pulm. Pt does not wish to see pulm at this time.

## 2016-10-06 ENCOUNTER — Other Ambulatory Visit: Payer: Self-pay | Admitting: Student

## 2016-10-06 DIAGNOSIS — M4722 Other spondylosis with radiculopathy, cervical region: Secondary | ICD-10-CM

## 2016-10-17 ENCOUNTER — Encounter: Payer: Self-pay | Admitting: Family Medicine

## 2016-10-17 ENCOUNTER — Ambulatory Visit (INDEPENDENT_AMBULATORY_CARE_PROVIDER_SITE_OTHER): Payer: PPO | Admitting: Family Medicine

## 2016-10-17 DIAGNOSIS — E1340 Other specified diabetes mellitus with diabetic neuropathy, unspecified: Secondary | ICD-10-CM | POA: Diagnosis not present

## 2016-10-17 DIAGNOSIS — E538 Deficiency of other specified B group vitamins: Secondary | ICD-10-CM

## 2016-10-17 DIAGNOSIS — K219 Gastro-esophageal reflux disease without esophagitis: Secondary | ICD-10-CM | POA: Insufficient documentation

## 2016-10-17 MED ORDER — GABAPENTIN 100 MG PO CAPS: 200.0000 mg | ORAL_CAPSULE | Freq: Every day | ORAL | 3 refills | Status: DC

## 2016-10-17 MED ORDER — PANTOPRAZOLE SODIUM 40 MG PO TBEC: 40.0000 mg | DELAYED_RELEASE_TABLET | Freq: Every day | ORAL | 3 refills | Status: DC

## 2016-10-17 NOTE — Assessment & Plan Note (Signed)
Trigger avoidance.  Refill pantoprazole.

## 2016-10-17 NOTE — Assessment & Plan Note (Signed)
Continue OTC supplement longterm.

## 2016-10-17 NOTE — Patient Instructions (Addendum)
Increase water.  Increase gabapentin to 200 mg at bedtime ( 2 tabs of 100 mg)  Continue vit B12 1000 mg daily at home. Refilled pantoprazole.

## 2016-10-17 NOTE — Assessment & Plan Note (Signed)
Improved with gabapentin at night.  Trial of increase to 200 mg at bedtime for better control.  Likely also better with repletion of B12.

## 2016-10-17 NOTE — Progress Notes (Signed)
Pre visit review using our clinic review tool, if applicable. No additional management support is needed unless otherwise documented below in the visit note. 

## 2016-10-17 NOTE — Progress Notes (Signed)
   Subjective:    Patient ID: Caleb Square Sr., male    DOB: 05-15-1934, 82 y.o.   MRN: [id number]  HPI 82 year old male presents for follow up on diabetic neuropathy foot pain.   At last OV on 10/04/2016.he was started on gabapentin low dose at night.  No SE to med. No sedation.  Has helped some with pain in feet.. Still pain in balls of feet and toes.  B12 was also found to be low and inj given 12/6.  Now taking b12 vit at home daily.  GERD.. Inadequate control... Started on pantoprazole by cardiology... Helped.. Need refill.  Review of Systems  Constitutional: Negative for fatigue.  HENT: Negative for ear pain.   Eyes: Negative for pain.  Respiratory: Negative for shortness of breath.   Cardiovascular: Negative for chest pain.       Heartburn        Objective:   Physical Exam  Constitutional: Vital signs are normal. He appears well-developed and well-nourished.  HENT:  Head: Normocephalic.  Right Ear: Hearing normal.  Left Ear: Hearing normal.  Nose: Nose normal.  Mouth/Throat: Oropharynx is clear and moist and mucous membranes are normal.  Neck: Trachea normal. Carotid bruit is not present. No thyroid mass and no thyromegaly present.  Cardiovascular: Normal rate, regular rhythm and normal pulses.  Exam reveals no gallop, no distant heart sounds and no friction rub.   No murmur heard. No peripheral edema  Pulmonary/Chest: Effort normal and breath sounds normal. No respiratory distress.  Skin: Skin is warm, dry and intact. No rash noted.  Psychiatric: He has a normal mood and affect. His speech is normal and behavior is normal. Thought content normal.          Assessment & Plan:

## 2016-10-18 ENCOUNTER — Ambulatory Visit
Admission: RE | Admit: 2016-10-18 | Discharge: 2016-10-18 | Disposition: A | Discharge status: Home / Self Care | Payer: PPO | Source: Ambulatory Visit | Attending: Student | Admitting: Student

## 2016-10-18 DIAGNOSIS — M50222 Other cervical disc displacement at C5-C6 level: Secondary | ICD-10-CM | POA: Diagnosis not present

## 2016-10-18 DIAGNOSIS — M4722 Other spondylosis with radiculopathy, cervical region: Secondary | ICD-10-CM | POA: Insufficient documentation

## 2016-11-09 ENCOUNTER — Other Ambulatory Visit: Payer: Self-pay | Admitting: Student

## 2016-11-09 DIAGNOSIS — M4722 Other spondylosis with radiculopathy, cervical region: Secondary | ICD-10-CM

## 2016-11-17 ENCOUNTER — Ambulatory Visit: Payer: PPO

## 2016-11-20 DIAGNOSIS — M19011 Primary osteoarthritis, right shoulder: Secondary | ICD-10-CM | POA: Diagnosis not present

## 2016-11-20 DIAGNOSIS — M19012 Primary osteoarthritis, left shoulder: Secondary | ICD-10-CM | POA: Diagnosis not present

## 2016-11-20 DIAGNOSIS — M4722 Other spondylosis with radiculopathy, cervical region: Secondary | ICD-10-CM | POA: Diagnosis not present

## 2016-12-08 ENCOUNTER — Ambulatory Visit (INDEPENDENT_AMBULATORY_CARE_PROVIDER_SITE_OTHER): Payer: PPO | Admitting: Cardiovascular Disease

## 2016-12-08 ENCOUNTER — Encounter: Payer: Self-pay | Admitting: Cardiovascular Disease

## 2016-12-08 VITALS — BP 120/68 | HR 74 | Ht 66.0 in | Wt 195.0 lb

## 2016-12-08 DIAGNOSIS — Z8679 Personal history of other diseases of the circulatory system: Secondary | ICD-10-CM

## 2016-12-08 NOTE — Progress Notes (Signed)
Cardiology Office Note   Date:  12/08/2016   ID:  Caleb Bryant Sr., DOB 08/01/1934, MRN [id number]  PCP:  Caleb Lofts, MD  Cardiologist:   Caleb Sacramento, MD   Chief Complaint  Patient presents with  . other    12 month follow up. Meds reviewed by the pt. verbally. Ascending Aorta of 4.1 cm from a CT of chest in March 2017.       History of Present Illness: Caleb Scheiber Sr. is a 83 y.o. male who presents for a followup visit regarding atrial flutter status post ablation and nonischemic cardiomyopathy. He has known history of type 2 diabetes not on medications, previous tobacco use, hypertension and hyperlipidemia.  He chest pain in the setting of atrial flutter in January of 2015 . Cardiac catheterization showed minor irregularities with no evidence of obstructive coronary artery disease. Echocardiogram showed an ejection fraction of 25-30%. He underwent catheter ablation by Dr. Lovena Le in February 4827 with no complications.  Ejection fraction improved gradually in most recent echocardiogram in August 2016 showed normal LV systolic function. He has been doing well from a cardiac standpoint with no chest pain or palpitations. No dizziness or tachycardia. He had a CT scan done of the chest last year for cough and was found to have a small ascending aortic aneurysm at 4.1 cm.   Past Medical History:  Diagnosis Date  . Anxiety   . Atrial flutter ([place])    "just dx'd today" (11/04/2013)  . Benign prostatic hypertrophy   . Borderline diabetes   . Colon, diverticulosis   . Exertional shortness of breath   . Gout   . Hyperlipidemia   . Hypertension   . Osteoarthritis    "all over" (11/04/2013)  . PVD (peripheral vascular disease) ([place])     Past Surgical History:  Procedure Laterality Date  . albation  11/04/13  . ANKLE FRACTURE SURGERY Right   . APPENDECTOMY  1997   "days before 1st colon OR"  . ATRIAL FLUTTER ABLATION N/A 11/26/2013   Procedure: ATRIAL FLUTTER  ABLATION;  Surgeon: Bryant Lance, MD;  Location: [facility] CATH LAB;  Service: Cardiovascular;  Laterality: N/A;  . CATARACT EXTRACTION, BILATERAL Bilateral   . CHOLECYSTECTOMY  12/03  . COLON SURGERY     "diverticulitis"  . COLONOSCOPY WITH ESOPHAGOGASTRODUODENOSCOPY (EGD)  12/00   gastic polyp   . COLONOSCOPY WITH ESOPHAGOGASTRODUODENOSCOPY (EGD)  11/07,12/2011   gastritis, 2 gastic polyps  . COLOSTOMY  1997  . COLOSTOMY REVERSAL     "think I wore it 4 months" (11/04/2013)  . CORNEAL TRANSPLANT Right   . CORONARY ANGIOGRAM  11/05/2013   Procedure: CORONARY ANGIOGRAM;  Surgeon: Caleb Hampshire, MD;  Location: [facility] CATH LAB;  Service: Cardiovascular;;  . HERNIA REPAIR  11/05/14   ventral hernia  . INGUINAL HERNIA REPAIR Left   . PARTIAL COLECTOMY  1997   "diverticulitis; busted; Byrnett"  . stress cardiolite negative  9/02   EF 60%  . TONSILLECTOMY AND ADENOIDECTOMY     child  . VENTRAL HERNIA REPAIR  11/07   Byrnett     Current Outpatient Prescriptions  Medication Sig Dispense Refill  . atorvastatin (LIPITOR) 20 MG tablet Take 1 tablet (20 mg total) by mouth daily. 90 tablet 3  . CINNAMON PO Take 1 tablet by mouth daily.    . Cyanocobalamin (VITAMIN B-12 PO) Take 1 tablet by mouth daily.    Marland Kitchen gabapentin (NEURONTIN) 100 MG capsule Take 2 capsules (200  mg total) by mouth at bedtime. 180 capsule 3  . hydrochlorothiazide (HYDRODIURIL) 25 MG tablet TAKE 1 TABLET(25 MG) BY MOUTH DAILY 90 tablet 1  . pantoprazole (PROTONIX) 40 MG tablet Take 1 tablet (40 mg total) by mouth daily. 90 tablet 3   No current facility-administered medications for this visit.     Allergies:   Codeine    Social History:  The patient  reports that he has quit smoking. His smoking use included Cigarettes. He has a 90.00 pack-year smoking history. He has quit using smokeless tobacco. His smokeless tobacco use included Chew. He reports that he does not drink alcohol or use drugs.   Family History:  The patient's  family history includes Coronary artery disease in his sister; Heart failure in his mother.    ROS:  Please see the history of present illness.   Otherwise, review of systems are positive for none.   All other systems are reviewed and negative.    PHYSICAL EXAM: VS:  BP 120/68 (BP Location: Left Arm, Patient Position: Sitting, Cuff Size: Normal)   Pulse 74   Ht _0  (1.676 m)   Wt 195 lb (88.5 kg)   BMI 31.47 kg/m  , BMI Body mass index is 31.47 kg/m. GEN: Well nourished, well developed, in no acute distress  HEENT: normal  Neck: no JVD, carotid bruits, or masses Cardiac: RRR; no  rubs, or gallops,no edema . One out of 6 systolic ejection murmur in the aortic area Respiratory:  clear to auscultation bilaterally, normal work of breathing GI: soft, nontender, nondistended, + BS MS: no deformity or atrophy  Skin: warm and dry, no rash Neuro:  Strength and sensation are intact Psych: euthymic mood, full affect   EKG:  EKG is ordered today. The ekg ordered today demonstrates normal sinus rhythm with right bundle branch block and left anterior fascicular block.   Recent Labs: 09/29/2016: ALT 12; BUN 17; Creatinine, Ser 1.45; Potassium 4.0; Sodium 141    Lipid Panel    Component Value Date/Time   CHOL 113 09/29/2016 1339   TRIG 63.0 09/29/2016 1339   HDL 49.70 09/29/2016 1339   CHOLHDL 2 09/29/2016 1339   VLDL 12.6 09/29/2016 1339   LDLCALC 51 09/29/2016 1339      Wt Readings from Last 3 Encounters:  12/08/16 195 lb (88.5 kg)  10/17/16 189 lb 5 oz (85.9 kg)  10/04/16 194 lb 4 oz (88.1 kg)       No flowsheet data found.    ASSESSMENT AND PLAN:  1.  History of atrial flutter status post successful ablation: No evidence of arrhythmia since then and thus he can follow-up with me as needed.  2. History of tachycardia-induced cardiomyopathy: Ejection fraction improved to normal after ablation.  3. Small ascending aortic aneurysm: Follow-up imaging in March 2018 is  recommended. If the size remains stable, this probably can be monitored every other year. Considering that the aneurysm was overall small in size and he is going to be 83 year old soon, I doubt that the aneurysm will become clinically significant during his lifetime.    Disposition:   FU with me as needed.   Signed,  Caleb Sacramento, MD  12/08/2016 11:03 AM    [place]

## 2016-12-08 NOTE — Patient Instructions (Addendum)
Medication Instructions: Continue same medications.   Labwork: None.   Procedures/Testing: None.   Follow-Up: As needed with Dr. Shine Mikes.   Any Additional Special Instructions Will Be Listed Below (If Applicable).     If you need a refill on your cardiac medications before your next appointment, please call your pharmacy.   

## 2016-12-13 ENCOUNTER — Other Ambulatory Visit: Payer: Self-pay | Admitting: Family Medicine

## 2017-01-12 ENCOUNTER — Other Ambulatory Visit: Payer: Self-pay | Admitting: Family Medicine

## 2017-05-05 DIAGNOSIS — M109 Gout, unspecified: Secondary | ICD-10-CM | POA: Diagnosis not present

## 2017-08-08 DIAGNOSIS — L570 Actinic keratosis: Secondary | ICD-10-CM | POA: Diagnosis not present

## 2017-08-08 DIAGNOSIS — L820 Inflamed seborrheic keratosis: Secondary | ICD-10-CM | POA: Diagnosis not present

## 2017-09-12 DIAGNOSIS — M7542 Impingement syndrome of left shoulder: Secondary | ICD-10-CM | POA: Diagnosis not present

## 2017-09-12 DIAGNOSIS — M7541 Impingement syndrome of right shoulder: Secondary | ICD-10-CM | POA: Diagnosis not present

## 2017-10-05 ENCOUNTER — Encounter: Payer: Self-pay | Admitting: Family Medicine

## 2017-10-05 ENCOUNTER — Other Ambulatory Visit: Payer: Self-pay

## 2017-10-05 ENCOUNTER — Ambulatory Visit: Payer: PPO | Admitting: Family Medicine

## 2017-10-05 VITALS — BP 124/80 | HR 71 | Temp 97.6°F | Ht 66.0 in | Wt 186.5 lb

## 2017-10-05 DIAGNOSIS — M1A00X1 Idiopathic chronic gout, unspecified site, with tophus (tophi): Secondary | ICD-10-CM | POA: Diagnosis not present

## 2017-10-05 DIAGNOSIS — E538 Deficiency of other specified B group vitamins: Secondary | ICD-10-CM

## 2017-10-05 DIAGNOSIS — N183 Chronic kidney disease, stage 3 (moderate): Secondary | ICD-10-CM | POA: Diagnosis not present

## 2017-10-05 DIAGNOSIS — E1122 Type 2 diabetes mellitus with diabetic chronic kidney disease: Secondary | ICD-10-CM | POA: Diagnosis not present

## 2017-10-05 DIAGNOSIS — E1340 Other specified diabetes mellitus with diabetic neuropathy, unspecified: Secondary | ICD-10-CM | POA: Diagnosis not present

## 2017-10-05 DIAGNOSIS — I5022 Chronic systolic (congestive) heart failure: Secondary | ICD-10-CM | POA: Diagnosis not present

## 2017-10-05 DIAGNOSIS — I10 Essential (primary) hypertension: Secondary | ICD-10-CM | POA: Diagnosis not present

## 2017-10-05 DIAGNOSIS — E1151 Type 2 diabetes mellitus with diabetic peripheral angiopathy without gangrene: Secondary | ICD-10-CM

## 2017-10-05 DIAGNOSIS — M1000 Idiopathic gout, unspecified site: Secondary | ICD-10-CM

## 2017-10-05 DIAGNOSIS — E1351 Other specified diabetes mellitus with diabetic peripheral angiopathy without gangrene: Secondary | ICD-10-CM | POA: Diagnosis not present

## 2017-10-05 DIAGNOSIS — E7800 Pure hypercholesterolemia, unspecified: Secondary | ICD-10-CM

## 2017-10-05 LAB — COMPREHENSIVE METABOLIC PANEL
ALK PHOS: 56 U/L (ref 39–117)
ALT: 11 U/L (ref 0–53)
AST: 18 U/L (ref 0–37)
Albumin: 4.1 g/dL (ref 3.5–5.2)
BUN: 20 mg/dL (ref 6–23)
CALCIUM: 9.4 mg/dL (ref 8.4–10.5)
CO2: 33 mEq/L — ABNORMAL HIGH (ref 19–32)
CREATININE: 1.18 mg/dL (ref 0.40–1.50)
Chloride: 97 mEq/L (ref 96–112)
GFR: 62.84 mL/min (ref >60.00)
Glucose, Bld: 99 mg/dL (ref 70–99)
Potassium: 3.9 mEq/L (ref 3.5–5.1)
Sodium: 138 mEq/L (ref 135–145)
TOTAL PROTEIN: 7.1 g/dL (ref 6.0–8.3)
Total Bilirubin: 0.8 mg/dL (ref 0.2–1.2)

## 2017-10-05 LAB — VITAMIN B12: VITAMIN B 12: 103 pg/mL — AB (ref 211–911)

## 2017-10-05 LAB — HEMOGLOBIN A1C: Hgb A1c MFr Bld: 6.7 % — ABNORMAL HIGH (ref 4.6–6.5)

## 2017-10-05 LAB — URIC ACID: Uric Acid, Serum: 7.4 mg/dL (ref 4.0–7.8)

## 2017-10-05 LAB — LIPID PANEL
Cholesterol: 108 mg/dL (ref 0–200)
HDL: 51.1 mg/dL (ref >39.00)
LDL CALC: 46 mg/dL (ref 0–99)
NONHDL: 57.15
Total CHOL/HDL Ratio: 2
Triglycerides: 58 mg/dL (ref 0.0–149.0)
VLDL: 11.6 mg/dL (ref 0.0–40.0)

## 2017-10-05 MED ORDER — HYDROCHLOROTHIAZIDE 25 MG PO TABS: ORAL_TABLET | ORAL | 3 refills | Status: DC

## 2017-10-05 MED ORDER — ATORVASTATIN CALCIUM 20 MG PO TABS: ORAL_TABLET | ORAL | 3 refills | Status: DC

## 2017-10-05 NOTE — Progress Notes (Signed)
   Subjective:    Patient ID: Caleb Gumz Sr., male    DOB: 01/20/1934, 83 y.o.   MRN: [id number]  HPI    83 year old male due for CPX presents for med refill.  Hypertension:  Good control on HCTZ BP Readings from Last 3 Encounters:  10/05/17 124/80  12/08/16 120/68  10/17/16 110/68  Using medication without problems or lightheadedness: none Chest pain with exertion:none Edema:none Short of breath: none Average home BPs: Other issues:  Elevated Cholesterol:  Due for re-eval. On atorvastatin Lab Results  Component Value Date   CHOL 113 09/29/2016   HDL 49.70 09/29/2016   LDLCALC 51 09/29/2016   TRIG 63.0 09/29/2016   CHOLHDL 2 09/29/2016     Due for re-eval of DM.Marland Kitchen On no med.  HAs numbness and pain in both feet. Feet are cold.  Hx of gout...  Has flares  Several times a year. Using medications without problems: Muscle aches:  Diet compliance: poor, eats a lot of fat back, organ meat, steak Exercise: Other complaints:   He has had pain in right scalp and head pain in last week. Pain in ear, behind ear and scalp. Pain constant in last week.  no new neuro changes, no weakness, no new numbness.  No rash.  Social History /Family History/Past Medical History reviewed in detail and updated in EMR if needed. Blood pressure 124/80, pulse 71, temperature 97.6 F (36.4 C), temperature source Oral, height _0  (1.676 m), weight 186 lb 8 oz (84.6 kg).   Review of Systems  Constitutional: Negative for fatigue and fever.  HENT: Negative for ear pain.   Eyes: Negative for pain.  Respiratory: Negative for cough and shortness of breath.   Cardiovascular: Negative for chest pain, palpitations and leg swelling.  Gastrointestinal: Negative for abdominal pain.  Genitourinary: Negative for dysuria.  Musculoskeletal: Positive for arthralgias.       Bilateral feet burning, intermittent soreness in right great toe  Neurological: Negative for syncope, light-headedness and  headaches.  Psychiatric/Behavioral: Negative for dysphoric mood.       Objective:   Physical Exam  Constitutional: Vital signs are normal. He appears well-developed and well-nourished.  HENT:  Head: Normocephalic.  Right Ear: Hearing normal.  Left Ear: Hearing normal.  Nose: Nose normal.  Mouth/Throat: Oropharynx is clear and moist and mucous membranes are normal.  Neck: Trachea normal. Carotid bruit is not present. No thyroid mass and no thyromegaly present.  Cardiovascular: Normal rate, regular rhythm and normal pulses. Exam reveals no gallop, no distant heart sounds and no friction rub.  No murmur heard. No peripheral edema  Pulmonary/Chest: Effort normal and breath sounds normal. No respiratory distress.  Neurological: He has normal strength. A sensory deficit is present.   Decreased sensation in bilateral feet.  Skin: Skin is warm, dry and intact. No rash noted.  Psychiatric: He has a normal mood and affect. His speech is normal and behavior is normal. Thought content normal.          Assessment & Plan:

## 2017-10-05 NOTE — Patient Instructions (Addendum)
Stop at lab on way out.  Can use  Colchicine for gout flares as needed. If uric acid is elevated we will start allopurinol to prevent gout flares.  If pain from numbness and neuropathy continuing .Marland Kitchen We can try a neuropathic pain med.  Can use tylenol for scalp tenderness.. Call if not improving in next 1-2 weeks.

## 2017-10-10 IMAGING — MR MR CERVICAL SPINE W/O CM
5 series · 32 of 48 positions shown · non-contrast
Comparison: 01/10/2013

CLINICAL DATA: The right arm numbness for 1 month. Bilateral
shoulder pain. Neck stiffness.

EXAM:
MRI CERVICAL SPINE WITHOUT CONTRAST
TECHNIQUE: Multiplanar, multisequence MR imaging of the cervical spine was
performed. No intravenous contrast was administered.

[Series 3: T2 · sagittal · 3.0mm · 0.70mm/px · 6 of 13 slices shown (1 of 2)]
[im 1/13]
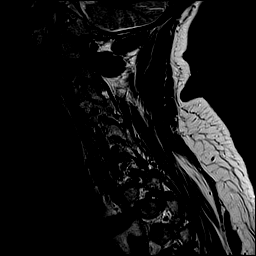
[im 3/13]
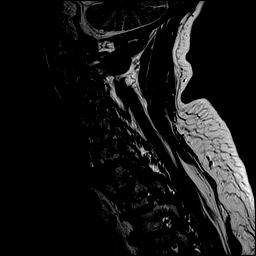
[im 5/13]
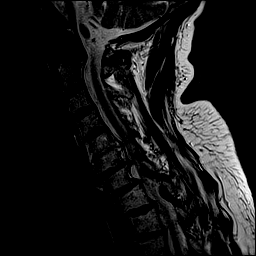
[im 8/13]
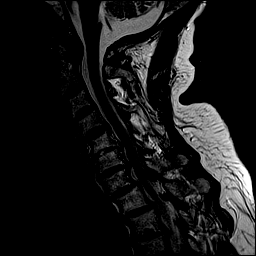
[im 10/13]
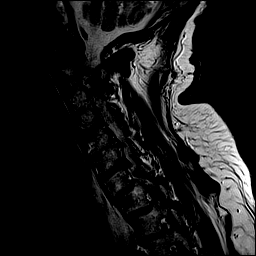
[im 13/13]
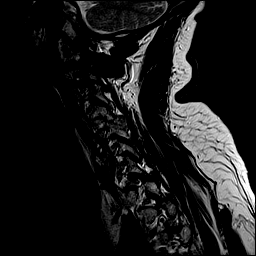

[Series 4: T1 · sagittal · 3.0mm · 0.70mm/px · 6 of 13 slices shown]
[im 1/13]
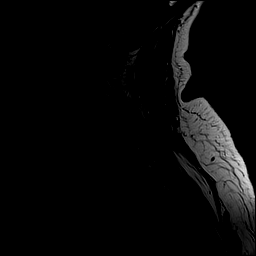
[im 3/13]
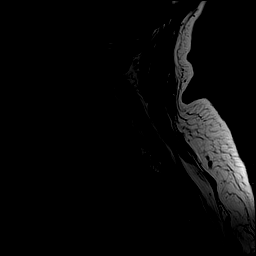
[im 5/13]
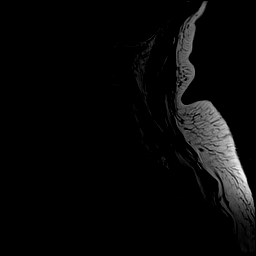
[im 8/13]
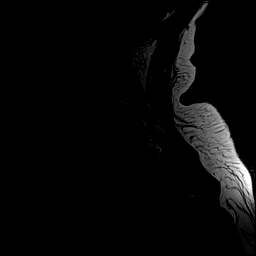
[im 10/13]
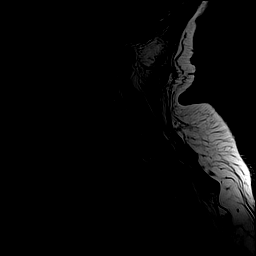
[im 13/13]
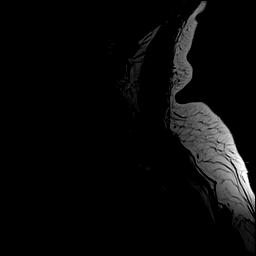

[Series 5: STIR · sagittal · 3.0mm · 0.35mm/px · 6 of 13 slices shown]
[im 1/13]
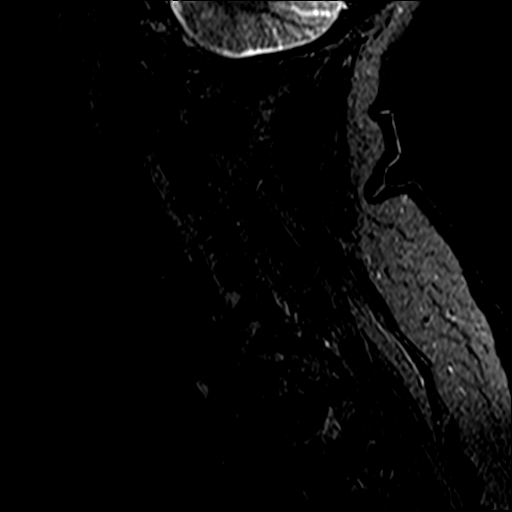
[im 3/13]
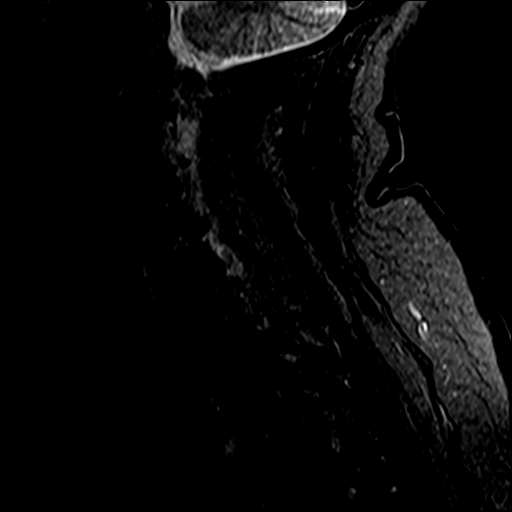
[im 5/13]
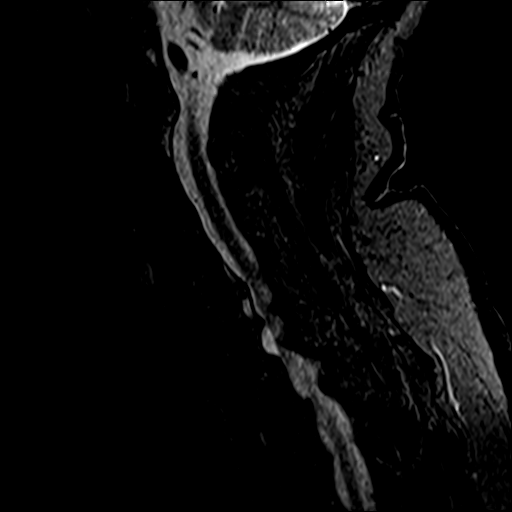
[im 8/13]
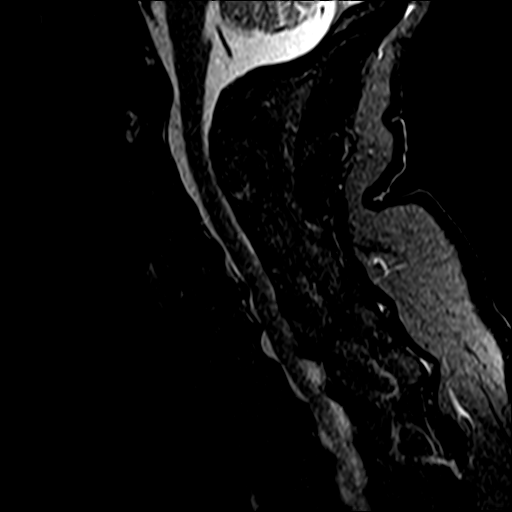
[im 10/13]
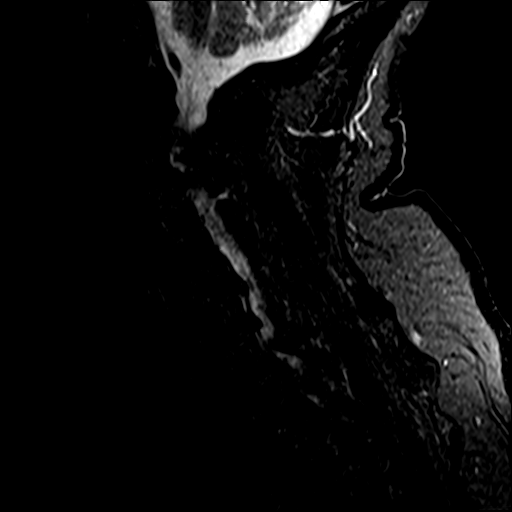
[im 13/13]
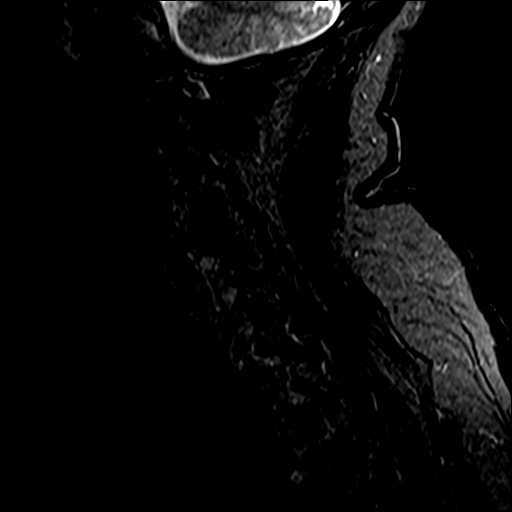

[Series 6: T2 · axial · 3.0mm · 0.70mm/px · z∈[-52,+40]mm · 9 of 31 slices shown (2 of 2)]
[im 1/31]
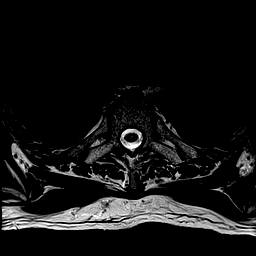
[im 5/31]
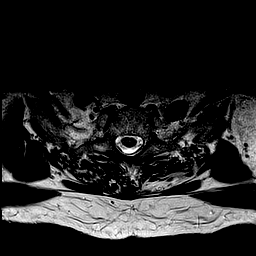
[im 9/31]
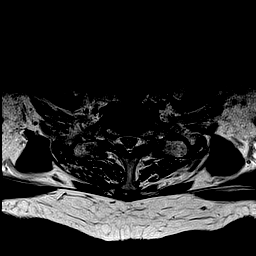
[im 13/31]
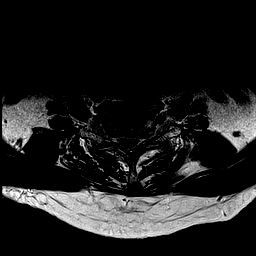
[im 16/31]
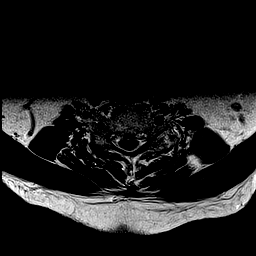
[im 18/31]
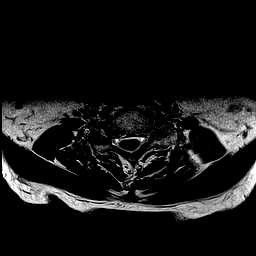
[im 22/31]
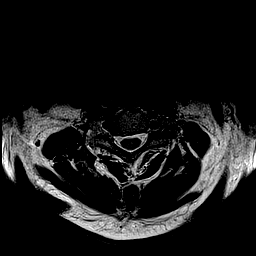
[im 26/31]
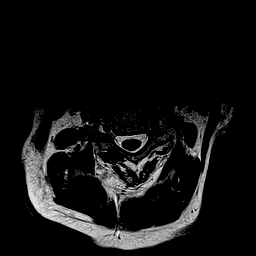
[im 31/31]
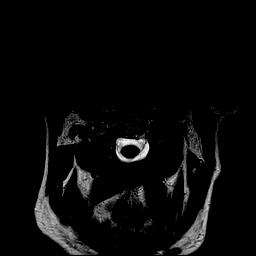

[Series 7: mpgr ax · axial · 3.0mm · 0.35mm/px · z∈[-52,-5]mm · 5 of 31 slices shown]
[im 1/31]
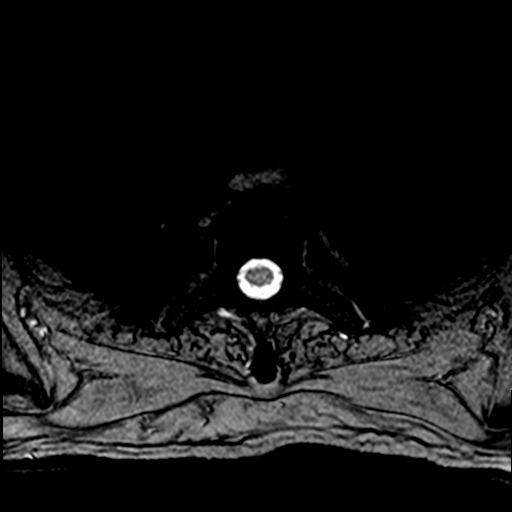
[im 5/31]
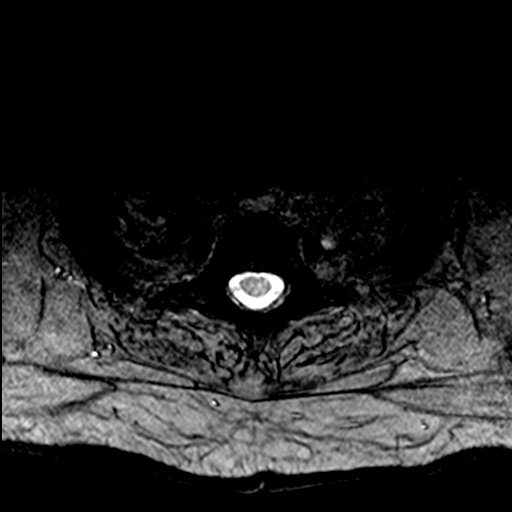
[im 9/31]
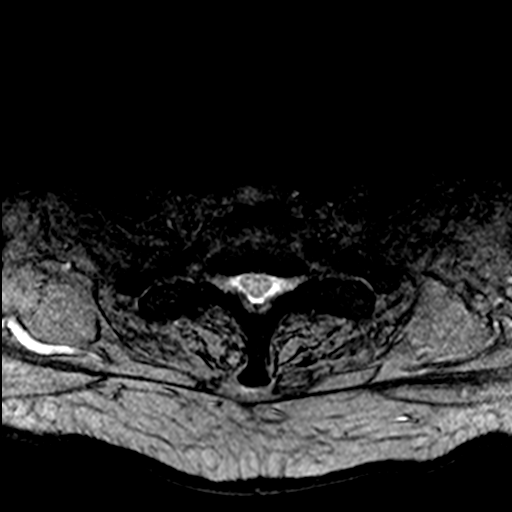
[im 13/31]
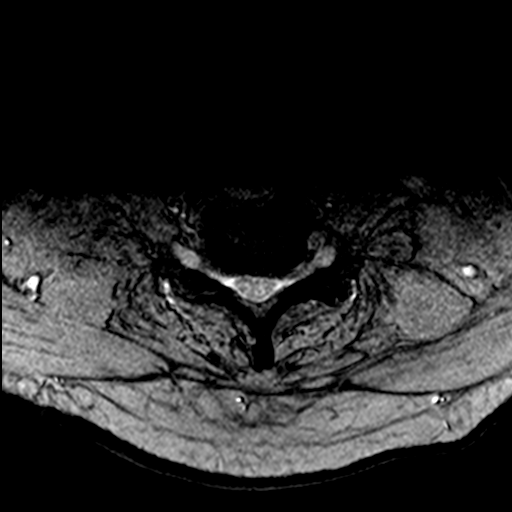
[im 18/31]
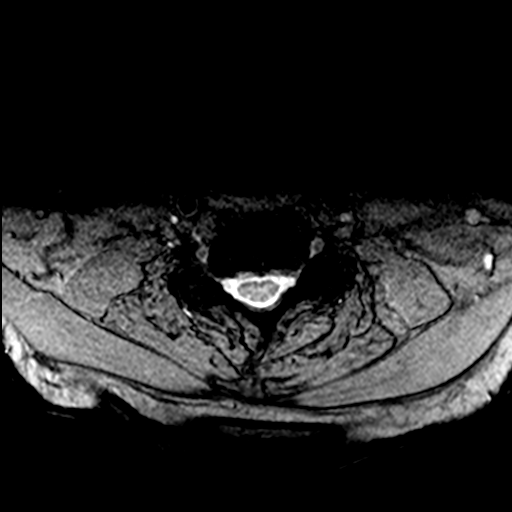

[32 of 48 positions shown; findings below may reference images not displayed]

FINDINGS: Alignment: 2 mm degenerative retrolisthesis at C6-7 with 4 mm
anterolisthesis at C7-T1 and 3 mm anterolisthesis at T1- 2,
generally the subluxations are not changed from 5994.

Vertebrae: Solid interbody fusion at C5-6. The facets appear fused
on the right at C4-5 and on the left potentially at C3-4. Small
Schmorl's node along the inferior endplate of T2. There is an
erosion of the right anterior base of the odontoid on image [DATE].

Cord: No significant abnormal spinal cord signal is observed.

Posterior Fossa, vertebral arteries, paraspinal tissues: Low T1 and
T2 signal intensity pannus posterior to the odontoid, 9 mm in
thickness, but not causing significant central narrowing of the
thecal sac at the craniocervical junction. Expected flow signal in
the vertebral arteries.

Disc levels:

C2-3: Mild bilateral foraminal stenosis primarily from facet
arthropathy.

C3-4: Moderate left and mild to moderate right foraminal stenosis
due to uncinate and facet spurring.

C4-5: Mild right and borderline left foraminal stenosis due to
uncinate and facet spurring. Mild disc bulge.

C5-6: Moderate to prominent bilateral foraminal stenosis and mild
central narrowing of the thecal sac due to posterior osseous
ridging, uncinate spurring, and facet arthropathy.

C6-7: Moderate to prominent bilateral foraminal stenosis and
moderate slightly left eccentric central narrowing of the thecal sac
due to uncinate spurring, disc bulge, and facet arthropathy.

C7-T1: Moderate left and mild to moderate right foraminal stenosis
and borderline central narrowing of the thecal sac due to disc
uncovering, uncinate spurring, and facet arthropathy.

T1-2: Moderate bilateral foraminal stenosis due to disc uncovering
and facet arthropathy.

T2-3: Mild right foraminal stenosis due to disc bulge and facet
arthropathy.
IMPRESSION: 1. Cervical spondylosis and degenerative disc disease, causing
moderate to prominent impingement at C5-6 and C6-7; moderate
impingement at C3-4, C7-T1, and T1-2; and mild impingement at C2- 3,
C4-5, and T2- 3, as detailed above.
2. Chronic degenerative subluxations at C6-7, C7-T1, and T1-2.
3. Erosion along the right anterior base of the odontoid with pannus
posterior to the odontoid. These findings can be seen in a variety
of erosive arthropathies including rheumatoid arthritis and gout.

## 2017-10-10 NOTE — Assessment & Plan Note (Signed)
Well controlled. Continue current medication.

## 2017-10-10 NOTE — Assessment & Plan Note (Signed)
Due for re-eval. 

## 2017-10-10 NOTE — Assessment & Plan Note (Signed)
Due for re-eval on no medication.

## 2017-10-10 NOTE — Assessment & Plan Note (Signed)
No current flare. Eval uric acid to determine need for allopurinol given he is unwilling to change diet (heavy organ meat). Will given colchicine again for flares as he feel insurance may now cover it.

## 2017-10-10 NOTE — Assessment & Plan Note (Signed)
Due in part to DM. Pt with foot pain but mpore consistent with gout flares as well as peripheral neuropathy.

## 2017-10-10 NOTE — Assessment & Plan Note (Signed)
Inadequate control.. Pt would like benefit from medicaiton like gabapentin. He would first like to treat gout flares.

## 2017-10-11 ENCOUNTER — Telehealth: Payer: Self-pay | Admitting: Family Medicine

## 2017-10-11 MED ORDER — ALLOPURINOL 100 MG PO TABS: 100.0000 mg | ORAL_TABLET | Freq: Every day | ORAL | 6 refills | Status: DC

## 2017-10-11 NOTE — Telephone Encounter (Signed)
Left message for Caleb Bryant that Dr. Diona Browner sent in the prescription for the allopurinol.  He will take one tablet by mouth daily.  I ask that he schedule a lab only appointment in 2 to 3 months for recheck his uric acid level.

## 2017-10-11 NOTE — Telephone Encounter (Signed)
-----  Message from Carter Kitten, [place] sent at 10/10/2017 12:48 PM EST ----- Caleb Bryant notified as instructed by telephone.  He would like to start the allopurinol. He will also start taking Vit B12 1000 mcg daily.

## 2017-10-11 NOTE — Telephone Encounter (Signed)
Have him return for uric acid recheck in 2-3 months.

## 2017-10-15 ENCOUNTER — Telehealth: Payer: Self-pay | Admitting: Family Medicine

## 2017-10-15 NOTE — Telephone Encounter (Signed)
Best number [phone]  Pt walked in today.  Wanting to know if he needs to still come in 11/15/17 appointment with lisa and cpx with dr Diona Browner on 11/20/17.  He wanted to know because he understood labs were ok Please let pt know

## 2017-10-16 NOTE — Telephone Encounter (Signed)
Can cancel appt with me but needs AMW with leisa.  Make sure have follow UP DM with labs prior in 6 months with me as well.

## 2017-10-17 NOTE — Telephone Encounter (Signed)
Left message for Caleb Bryant that we can cancel appt with Dr. Diona Browner on 11/20/2017 but she still wants him to see Lesia on 11/15/2017 at 8:15 am for his [place]. He will also need to schedule a follow up on DM with labs prior in 6 months with Dr. Diona Browner.

## 2017-10-22 ENCOUNTER — Other Ambulatory Visit: Payer: Self-pay | Admitting: Family Medicine

## 2017-10-29 DIAGNOSIS — R6889 Other general symptoms and signs: Secondary | ICD-10-CM | POA: Diagnosis not present

## 2017-11-06 DIAGNOSIS — R0781 Pleurodynia: Secondary | ICD-10-CM | POA: Diagnosis not present

## 2017-11-15 ENCOUNTER — Encounter: Payer: Self-pay | Admitting: *Deleted

## 2017-11-15 ENCOUNTER — Ambulatory Visit (INDEPENDENT_AMBULATORY_CARE_PROVIDER_SITE_OTHER): Payer: PPO

## 2017-11-15 VITALS — BP 118/70 | HR 76 | Temp 97.9°F | Ht 65.0 in | Wt 183.8 lb

## 2017-11-15 DIAGNOSIS — Z125 Encounter for screening for malignant neoplasm of prostate: Secondary | ICD-10-CM

## 2017-11-15 DIAGNOSIS — E1151 Type 2 diabetes mellitus with diabetic peripheral angiopathy without gangrene: Secondary | ICD-10-CM | POA: Diagnosis not present

## 2017-11-15 DIAGNOSIS — Z0000 Encounter for general adult medical examination without abnormal findings: Secondary | ICD-10-CM

## 2017-11-15 LAB — MICROALBUMIN / CREATININE URINE RATIO
CREATININE, U: 97.9 mg/dL
MICROALB UR: 1.3 mg/dL (ref 0.0–1.9)
Microalb Creat Ratio: 1.3 mg/g (ref 0.0–30.0)

## 2017-11-15 LAB — PSA, MEDICARE: PSA: 0.42 ng/ml (ref 0.10–4.00)

## 2017-11-15 NOTE — Progress Notes (Signed)
PCP notes:   Health maintenance:  Foot exam - PCP please address at next appt Eye exam - addressed; per pt, exam in 2018 Microalbumin - completed  Abnormal screenings:   Mini-Cog score: 19/20 Hearing - failed  Hearing Screening   _0  _1  _2  _3  _4  _5  _6  _7  _8   Right ear:   40 40 0  0    Left ear:   0 0 0  0     Patient concerns:   Patient is still experiencing respiratory concerns. States he was unable to take cough syrup prescribed by urgent care doctor. Completed antibiotics as prescribed.  Nurse concerns:  None  Next PCP appt:   04/12/18 @ 0830

## 2017-11-15 NOTE — Patient Instructions (Addendum)
Caleb Bryant , Thank you for taking time to come for your Medicare Wellness Visit. I appreciate your ongoing commitment to your health goals. Please review the following plan we discussed and let me know if I can assist you in the future.   These are the goals we discussed: Goals    . Follow up with Primary Care Provider     Starting 11/15/2017, I will continue to take medications as prescribed and to keep appointments as scheduled with PCP.        This is a list of the screening recommended for you and due dates:  Health Maintenance  Topic Date Due  . Complete foot exam   10/29/2018*  . Hemoglobin A1C  04/05/2018  . Eye exam for diabetics  07/30/2018  . Urine Protein Check  11/15/2018  . Tetanus Vaccine  05/13/2020  . Flu Shot  Completed  . Pneumonia vaccines  Completed  *Topic was postponed. The date shown is not the original due date.   Preventive Care for Adults  A healthy lifestyle and preventive care can promote health and wellness. Preventive health guidelines for adults include the following key practices.  . A routine yearly physical is a good way to check with your health care provider about your health and preventive screening. It is a chance to share any concerns and updates on your health and to receive a thorough exam.  . Visit your dentist for a routine exam and preventive care every 6 months. Brush your teeth twice a day and floss once a day. Good oral hygiene prevents tooth decay and gum disease.  . The frequency of eye exams is based on your age, health, family medical history, use  of contact lenses, and other factors. Follow your health care provider's recommendations for frequency of eye exams.  . Eat a healthy diet. Foods like vegetables, fruits, whole grains, low-fat dairy products, and lean protein foods contain the nutrients you need without too many calories. Decrease your intake of foods high in solid fats, added sugars, and salt. Eat the right amount of  calories for you. Get information about a proper diet from your health care provider, if necessary.  . Regular physical exercise is one of the most important things you can do for your health. Most adults should get at least 150 minutes of moderate-intensity exercise (any activity that increases your heart rate and causes you to sweat) each week. In addition, most adults need muscle-strengthening exercises on 2 or more days a week.  Silver Sneakers may be a benefit available to you. To determine eligibility, you may visit the website: www.silversneakers.com or contact program at 364-493-7694 Mon-Fri between 8AM-8PM.   . Maintain a healthy weight. The body mass index (BMI) is a screening tool to identify possible weight problems. It provides an estimate of body fat based on height and weight. Your health care provider can find your BMI and can help you achieve or maintain a healthy weight.   For adults 20 years and older: ? A BMI below 18.5 is considered underweight. ? A BMI of 18.5 to 24.9 is normal. ? A BMI of 25 to 29.9 is considered overweight. ? A BMI of 30 and above is considered obese.   . Maintain normal blood lipids and cholesterol levels by exercising and minimizing your intake of saturated fat. Eat a balanced diet with plenty of fruit and vegetables. Blood tests for lipids and cholesterol should begin at age 59 and be repeated every 5 years.  If your lipid or cholesterol levels are high, you are over 50, or you are at high risk for heart disease, you may need your cholesterol levels checked more frequently. Ongoing high lipid and cholesterol levels should be treated with medicines if diet and exercise are not working.  . If you smoke, find out from your health care provider how to quit. If you do not use tobacco, please do not start.  . If you choose to drink alcohol, please do not consume more than 2 drinks per day. One drink is considered to be 12 ounces (355 mL) of beer, 5 ounces  (148 mL) of wine, or 1.5 ounces (44 mL) of liquor.  . If you are 35-2 years old, ask your health care provider if you should take aspirin to prevent strokes.  . Use sunscreen. Apply sunscreen liberally and repeatedly throughout the day. You should seek shade when your shadow is shorter than you. Protect yourself by wearing long sleeves, pants, a wide-brimmed hat, and sunglasses year round, whenever you are outdoors.  . Once a month, do a whole body skin exam, using a mirror to look at the skin on your back. Tell your health care provider of new moles, moles that have irregular borders, moles that are larger than a pencil eraser, or moles that have changed in shape or color.

## 2017-11-15 NOTE — Progress Notes (Signed)
I reviewed health advisor's note, was available for consultation, and agree with documentation and plan.  Signed,  Maud Deed. Mercy Leppla, MD

## 2017-11-15 NOTE — Progress Notes (Signed)
Subjective:   Declan Mier Brenes Sr. is a 84 y.o. male who presents for Medicare Annual/Subsequent preventive examination.  Review of Systems:  N/A Cardiac Risk Factors include: advanced age (>14mn, >>66women);male gender;obesity (BMI >30kg/m2);diabetes mellitus;dyslipidemia;hypertension     Objective:    Vitals: BP 118/70 (BP Location: Right Arm, Patient Position: Sitting, Cuff Size: Normal)   Pulse 76   Temp 97.9 F (36.6 C) (Oral)   Ht _0  (1.651 m) Comment: no shoes  Wt 183 lb 12 oz (83.3 kg)   SpO2 90%   BMI 30.58 kg/m   Body mass index is 30.58 kg/m.  Advanced Directives 11/15/2017 09/29/2016 11/26/2013 11/05/2013  Does Patient Have a Medical Advance Directive? No No Patient does not have advance directive Patient does not have advance directive;Patient would not like information  Would patient like information on creating a medical advance directive? No - Patient declined - - -    Tobacco Social History   Tobacco Use  Smoking Status Former Smoker  . Packs/day: 3.00  . Years: 30.00  . Pack years: 90.00  . Types: Cigarettes  Smokeless Tobacco Former USystems developer . Types: Chew  Tobacco Comment   11/04/2013 "quit smoking in the 1980's or so; quit chewing before I quit smoking"     Counseling given: No Comment: 11/04/2013 "quit smoking in the 1980's or so; quit chewing before I quit smoking"   Clinical Intake:  Pre-visit preparation completed: Yes  Pain : No/denies pain Pain Score: 0-No pain     Nutritional Status: BMI > 30  Obese Nutritional Risks: None Diabetes: Yes CBG done?: No Did pt. bring in CBG monitor from home?: No  How often do you need to have someone help you when you read instructions, pamphlets, or other written materials from your doctor or pharmacy?: 1 - Never  Interpreter Needed?: No  Comments: pt lives with spouse Information entered by :: LPinson, LPN  Past Medical History:  Diagnosis Date  . Anxiety   . [place]rial flutter ([place]    "just  dx'd today" (11/04/2013)  . Benign prostatic hypertrophy   . Borderline diabetes   . Colon, diverticulosis   . Exertional shortness of breath   . Gout   . Hyperlipidemia   . Hypertension   . Osteoarthritis    "all over" (11/04/2013)  . PVD ([place]ipheral vascular disease) ([place]    Past Surgical History:  Procedure Laterality Date  . albation  11/04/13  . ANKLE FRACTURE SURGERY Right   . APPENDECTOMY  1997   "days before 1st colon OR"  . ATRIAL FLUTTER ABLATION N/A 11/26/2013   Procedure: ATRIAL FLUTTER ABLATION;  Surgeon: GEvans Lance MD;  [facility];  Service: Cardiovascular;  Laterality: N/A;  . CATARACT EXTRACTION, BILATERAL Bilateral   . CHOLECYSTECTOMY  12/03  . COLON SURGERY     "diverticulitis"  . COLONOSCOPY WITH ESOPHAGOGASTRODUODENOSCOPY (EGD)  12/00   gastic polyp   . COLONOSCOPY WITH ESOPHAGOGASTRODUODENOSCOPY (EGD)  11/07,12/2011   gastritis, 2 gastic polyps  . COLOSTOMY  1997  . COLOSTOMY REVERSAL     "think I wore it 4 months" (11/04/2013)  . CORNEAL TRANSPLANT Right   . CORONARY ANGIOGRAM  11/05/2013   Procedure: CORONARY ANGIOGRAM;  Surgeon: MWellington Hampshire MD;  [facility];  Service: Cardiovascular;;  . HERNIA REPAIR  11/05/14   ventral hernia  . INGUINAL HERNIA REPAIR Left   . PARTIAL COLECTOMY  1997   "diverticulitis; busted; Byrnett"  . stress cardiolite  negative  9/02   EF 60%  . TONSILLECTOMY AND ADENOIDECTOMY     child  . VENTRAL HERNIA REPAIR  11/07   Byrnett   Family History  Problem Relation Age of Onset  . Coronary artery disease Sister   . Heart failure Mother    Social History   Socioeconomic History  . Marital status: Married    Spouse name: None  . Number of children: 3  . Years of education: None  . Highest education level: None  Social Needs  . Financial resource strain: None  . Food insecurity - worry: None  . Food insecurity - inability: None  . Transportation needs - medical: None  . Transportation needs -  non-medical: None  Occupational History    Employer: RETIRED  Tobacco Use  . Smoking status: Former Smoker    Packs/day: 3.00    Years: 30.00    Pack years: 90.00    Types: Cigarettes  . Smokeless tobacco: Former Systems developer    Types: Chew  . Tobacco comment: 11/04/2013 "quit smoking in the 1980's or so; quit chewing before I quit smoking"  Substance and Sexual Activity  . Alcohol use: No    Comment: 11/04/2013 "quit driking in 1980's; was called a weekend alcoholic"  . Drug use: No  . Sexual activity: No  Other Topics Concern  . None  Social History Narrative   Still does lawn work.    No living will, Wife is HCPOA. (Reviewed 2015)    Outpatient Encounter Medications as of 11/15/2017  Medication Sig  . allopurinol (ZYLOPRIM) 100 MG tablet Take 1 tablet (100 mg total) by mouth daily.  Marland Kitchen atorvastatin (LIPITOR) 20 MG tablet TAKE 1 TABLET(20 MG) BY MOUTH DAILY  . Cyanocobalamin (B-12 PO) Take 1 tablet by mouth daily.  . hydrochlorothiazide (HYDRODIURIL) 25 MG tablet TAKE 1 TABLET(25 MG) BY MOUTH DAILY   No facility-administered encounter medications on file as of 11/15/2017.     Activities of Daily Living In your present state of health, do you have any difficulty performing the following activities: 11/15/2017  Hearing? Y  Vision? N  Difficulty concentrating or making decisions? Y  Comment concerns with short-term memory  Walking or climbing stairs? Y  Comment intermittent SOB  Dressing or bathing? N  Doing errands, shopping? N  Preparing Food and eating ? N  Using the Toilet? N  In the past six months, have you accidently leaked urine? Y  Comment leakage if he does not get to bathroom on time  Do you have problems with loss of bowel control? N  Managing your Medications? N  Managing your Finances? N  Comment states spouse has always managed finances  Housekeeping or managing your Housekeeping? N  Some recent data might be hidden    Patient Care Team: [name], MD as  PCP - General Byrnett, Forest Gleason, MD (General Surgery) Venia Carbon, MD as Referring Physician (Internal Medicine) Agapito Games as Consulting Physician (Optometry)   Assessment:   This is a routine wellness examination for [facility].   Hearing Screening   _0  _1  _2  _3  _4  _5  _6  _7  _8   Right ear:   40 40 0  0    Left ear:   0 0 0  0    Vision Screening Comments: Last vision exam in 2018 with Dr. Marvel Plan    Exercise Activities and Dietary recommendations Current Exercise Habits: The patient does not participate in regular exercise at present, Exercise limited by: None identified  Goals    . Follow up with Primary Care Provider     Starting 11/15/2017, I will continue to take medications as prescribed and to keep appointments as scheduled with PCP.        Fall Risk Fall Risk  11/15/2017 09/29/2016 10/14/2015 06/22/2014  Falls in the past year? No No Yes No  Number falls in past yr: - - 1 -  Injury with Fall? - - No -     Depression Screen PHQ 2/9 Scores 11/15/2017 09/29/2016 10/14/2015 06/22/2014  PHQ - 2 Score 0 0 0 0  PHQ- 9 Score 0 - - -    Cognitive Function MMSE - Mini Mental State Exam 11/15/2017 09/29/2016  Orientation to time 5 5  Orientation to Place 5 5  Registration 3 3  Attention/ Calculation 0 0  Recall 2 3  Recall-comments unable to recall 1 of 3 words -  Language- name 2 objects 0 0  Language- repeat 1 1  Language- follow 3 step command 3 3  Language- read & follow direction 0 0  Write a sentence 0 0  Copy design 0 0  Total score 19 20       PLEASE NOTE: A Mini-Cog screen was completed. Maximum score is 20. A value of 0 denotes this part of Folstein MMSE was not completed or the patient failed this part of the Mini-Cog screening.   Mini-Cog Screening Orientation to Time - Max 5 pts Orientation to Place - Max 5 pts Registration - Max 3 pts Recall - Max 3 pts Language Repeat - Max 1 pts Language Follow 3 Step  Command - Max 3 pts   Immunization History  Administered Date(s) Administered  . Influenza Split 07/31/2011, 08/08/2012, 09/01/2013  . Influenza Whole 10/05/2004, 08/16/2007, 08/11/2008, 08/06/2009  . Influenza, High Dose Seasonal PF 07/27/2016, 08/09/2017  . Influenza,inj,Quad PF,6+ Mos 06/22/2014, 10/14/2015  . Pneumococcal Conjugate-13 06/22/2014  . Pneumococcal Polysaccharide-23 04/14/2009  . Td 10/31/1997, 05/13/2010   Screening Tests Health Maintenance  Topic Date Due  . FOOT EXAM  10/29/2018 (Originally 10/13/2016)  . HEMOGLOBIN A1C  04/05/2018  . OPHTHALMOLOGY EXAM  07/30/2018  . URINE MICROALBUMIN  11/15/2018  . TETANUS/TDAP  05/13/2020  . INFLUENZA VACCINE  Completed  . PNA vac Low Risk Adult  Completed       Plan:     I have personally reviewed, addressed, and noted the following in the patient's chart:  A. Medical and social history B. Use of alcohol, tobacco or illicit drugs  C. Current medications and supplements D. Functional ability and status E.  Nutritional status F.  Physical activity G. Advance directives H. List of other physicians I.  Hospitalizations, surgeries, and ER visits in previous 12 months J.  [place] to include hearing, vision, cognitive, depression L. Referrals and appointments - none  In addition, I have reviewed and discussed with patient certain preventive protocols, quality metrics, and best practice recommendations. A written personalized care plan for preventive services as well as general preventive health recommendations were provided to patient.  See attached scanned questionnaire for additional information.   Signed,   Lindell Noe, MHA, BS, LPN Health Coach

## 2017-11-15 NOTE — Progress Notes (Signed)
Pre visit review using our clinic review tool, if applicable. No additional management support is needed unless otherwise documented below in the visit note.

## 2017-11-20 ENCOUNTER — Encounter: Payer: PPO | Admitting: Family Medicine

## 2017-12-21 ENCOUNTER — Telehealth: Payer: Self-pay | Admitting: Family Medicine

## 2017-12-21 NOTE — Telephone Encounter (Signed)
I have not called the patient.

## 2017-12-21 NOTE — Telephone Encounter (Signed)
Pt came by stating he had a voicemail asking him to call back. He's wondering why we called but I can't find anything documented. I advised him we couldn't pull cma from current patients. He's wanting to wait and discuss in person.

## 2017-12-21 NOTE — Telephone Encounter (Addendum)
Caleb Bryant stopped me in the lobby.  I advised that I had not tried to reach him for any reason.   He said he received a call from here that said it was very important.  I see no reason why patient would have received a phone call.  Will forward to Dr. Diona Browner to make sure she has not tried to call patient.

## 2018-01-01 DIAGNOSIS — H20013 Primary iridocyclitis, bilateral: Secondary | ICD-10-CM | POA: Diagnosis not present

## 2018-01-08 DIAGNOSIS — M545 Low back pain: Secondary | ICD-10-CM | POA: Diagnosis not present

## 2018-01-08 DIAGNOSIS — M7542 Impingement syndrome of left shoulder: Secondary | ICD-10-CM | POA: Diagnosis not present

## 2018-01-08 DIAGNOSIS — M7541 Impingement syndrome of right shoulder: Secondary | ICD-10-CM | POA: Diagnosis not present

## 2018-01-23 DIAGNOSIS — M7541 Impingement syndrome of right shoulder: Secondary | ICD-10-CM | POA: Diagnosis not present

## 2018-01-23 DIAGNOSIS — M7542 Impingement syndrome of left shoulder: Secondary | ICD-10-CM | POA: Diagnosis not present

## 2018-01-28 DIAGNOSIS — M48061 Spinal stenosis, lumbar region without neurogenic claudication: Secondary | ICD-10-CM | POA: Diagnosis not present

## 2018-01-30 DIAGNOSIS — M545 Low back pain: Secondary | ICD-10-CM | POA: Diagnosis not present

## 2018-02-06 DIAGNOSIS — M47817 Spondylosis without myelopathy or radiculopathy, lumbosacral region: Secondary | ICD-10-CM | POA: Diagnosis not present

## 2018-03-16 DIAGNOSIS — J019 Acute sinusitis, unspecified: Secondary | ICD-10-CM | POA: Diagnosis not present

## 2018-03-21 DIAGNOSIS — M47817 Spondylosis without myelopathy or radiculopathy, lumbosacral region: Secondary | ICD-10-CM | POA: Diagnosis not present

## 2018-04-01 DIAGNOSIS — B9689 Other specified bacterial agents as the cause of diseases classified elsewhere: Secondary | ICD-10-CM | POA: Diagnosis not present

## 2018-04-01 DIAGNOSIS — J209 Acute bronchitis, unspecified: Secondary | ICD-10-CM | POA: Diagnosis not present

## 2018-04-01 DIAGNOSIS — R05 Cough: Secondary | ICD-10-CM | POA: Diagnosis not present

## 2018-04-01 DIAGNOSIS — J9801 Acute bronchospasm: Secondary | ICD-10-CM | POA: Diagnosis not present

## 2018-04-01 DIAGNOSIS — J019 Acute sinusitis, unspecified: Secondary | ICD-10-CM | POA: Diagnosis not present

## 2018-04-05 ENCOUNTER — Ambulatory Visit (INDEPENDENT_AMBULATORY_CARE_PROVIDER_SITE_OTHER): Payer: PPO | Admitting: Family Medicine

## 2018-04-05 ENCOUNTER — Encounter: Payer: Self-pay | Admitting: Family Medicine

## 2018-04-05 VITALS — BP 136/86 | HR 80 | Temp 98.7°F | Ht 65.0 in | Wt 184.8 lb

## 2018-04-05 DIAGNOSIS — M1A00X1 Idiopathic chronic gout, unspecified site, with tophus (tophi): Secondary | ICD-10-CM | POA: Diagnosis not present

## 2018-04-05 DIAGNOSIS — E1340 Other specified diabetes mellitus with diabetic neuropathy, unspecified: Secondary | ICD-10-CM | POA: Diagnosis not present

## 2018-04-05 DIAGNOSIS — E1351 Other specified diabetes mellitus with diabetic peripheral angiopathy without gangrene: Secondary | ICD-10-CM

## 2018-04-05 DIAGNOSIS — E1151 Type 2 diabetes mellitus with diabetic peripheral angiopathy without gangrene: Secondary | ICD-10-CM | POA: Diagnosis not present

## 2018-04-05 MED ORDER — GABAPENTIN 100 MG PO CAPS: ORAL_CAPSULE | ORAL | 3 refills | Status: DC

## 2018-04-05 NOTE — Assessment & Plan Note (Signed)
Will return for  6 month re-eval in 2 weeks.

## 2018-04-05 NOTE — Assessment & Plan Note (Signed)
Start gabapentin and titrate to effectiveness.  Follow up in 2 weeks.

## 2018-04-05 NOTE — Patient Instructions (Signed)
Start gabapentin 100 mg at bedtime, if no side effects increase to 2 tabs after 1 week.

## 2018-04-05 NOTE — Progress Notes (Signed)
   Subjective:    Patient ID: Caleb Sakata Sr., male    DOB: [birth year], 84 y.o.   MRN: [id number]  HPI   84 year old male present for URI symptoms not improving after 2 courses of antibiotics.  Seen at urgent care for cough, headache, congestion.. Dx with sinus infection.  Treated with flonase and antibiotics not sure what.   1 week later 04/01/2018 not better  Currently taking albuterol, benzonatate, avelox and prednsione. He feels he may now be starting to improve.  He also notes chronic foot pain, bilateral.  He reports pain in balls of feet and numbess on sole of feet. Not really pain.. Just irritation.  no burning.   Having gout flares in great toes off and on.. Better since on allopurinol 100 mg.  Lab Results  Component Value Date   HGBA1C 6.7 (H) 10/05/2017    Hx of DM and gout. Review of Systems  Constitutional: Negative for fatigue and fever.  HENT: Negative for ear pain.   Eyes: Negative for pain.  Respiratory: Positive for cough. Negative for shortness of breath.   Cardiovascular: Negative for chest pain, palpitations and leg swelling.  Gastrointestinal: Negative for abdominal pain.  Genitourinary: Negative for dysuria.  Musculoskeletal: Negative for arthralgias.  Neurological: Negative for syncope, light-headedness and headaches.  Psychiatric/Behavioral: Negative for dysphoric mood.       Objective:   Physical Exam  Constitutional: He is oriented to person, place, and time. Vital signs are normal. He appears well-developed and well-nourished.  HENT:  Head: Normocephalic.  Right Ear: Hearing normal.  Left Ear: Hearing normal.  Nose: Nose normal.  Mouth/Throat: Oropharynx is clear and moist and mucous membranes are normal.  Neck: Trachea normal. Carotid bruit is not present. No thyroid mass and no thyromegaly present.  Cardiovascular: Normal rate, regular rhythm and normal pulses. Exam reveals no gallop, no distant heart sounds and no friction rub.    No murmur heard. No peripheral edema  Pulmonary/Chest: Effort normal and breath sounds normal. No respiratory distress. He has no wheezes. He has no rhonchi. He has no rales.  Neurological: He is oriented to person, place, and time. He has normal strength and normal reflexes. A sensory deficit is present. No cranial nerve deficit.   Decreased sensation in anterior feet over soles  Skin: Skin is warm, dry and intact. No rash noted.  Psychiatric: He has a normal mood and affect. His speech is normal and behavior is normal. Thought content normal.          Assessment & Plan:

## 2018-04-05 NOTE — Assessment & Plan Note (Signed)
No current flare. Allopurinol has helped decreased falres some.  Check uric acid with upcoming labs.

## 2018-04-05 NOTE — Assessment & Plan Note (Signed)
Issue does not seem to be related to PAD, no claudication symptoms.

## 2018-04-12 ENCOUNTER — Ambulatory Visit: Payer: PPO | Admitting: Family Medicine

## 2018-04-12 ENCOUNTER — Telehealth: Payer: Self-pay | Admitting: Family Medicine

## 2018-04-12 DIAGNOSIS — E1151 Type 2 diabetes mellitus with diabetic peripheral angiopathy without gangrene: Secondary | ICD-10-CM

## 2018-04-12 DIAGNOSIS — M1A00X1 Idiopathic chronic gout, unspecified site, with tophus (tophi): Secondary | ICD-10-CM

## 2018-04-12 DIAGNOSIS — E538 Deficiency of other specified B group vitamins: Secondary | ICD-10-CM

## 2018-04-12 NOTE — Telephone Encounter (Signed)
-----  Message from Ellamae Sia sent at 04/09/2018 10:17 AM EDT ----- Regarding: Lab orders for Monday, 6.17.19 Lab orders for a 2 week f/u

## 2018-04-15 ENCOUNTER — Other Ambulatory Visit (INDEPENDENT_AMBULATORY_CARE_PROVIDER_SITE_OTHER): Payer: PPO

## 2018-04-15 DIAGNOSIS — M1A00X1 Idiopathic chronic gout, unspecified site, with tophus (tophi): Secondary | ICD-10-CM | POA: Diagnosis not present

## 2018-04-15 DIAGNOSIS — E538 Deficiency of other specified B group vitamins: Secondary | ICD-10-CM | POA: Diagnosis not present

## 2018-04-15 DIAGNOSIS — E1151 Type 2 diabetes mellitus with diabetic peripheral angiopathy without gangrene: Secondary | ICD-10-CM | POA: Diagnosis not present

## 2018-04-15 LAB — COMPREHENSIVE METABOLIC PANEL
ALBUMIN: 4 g/dL (ref 3.5–5.2)
ALT: 20 U/L (ref 0–53)
AST: 22 U/L (ref 0–37)
Alkaline Phosphatase: 64 U/L (ref 39–117)
BUN: 14 mg/dL (ref 6–23)
CALCIUM: 9.9 mg/dL (ref 8.4–10.5)
CHLORIDE: 98 meq/L (ref 96–112)
CO2: 33 meq/L — AB (ref 19–32)
CREATININE: 1.12 mg/dL (ref 0.40–1.50)
GFR: 66.66 mL/min (ref >60.00)
Glucose, Bld: 113 mg/dL — ABNORMAL HIGH (ref 70–99)
POTASSIUM: 4.5 meq/L (ref 3.5–5.1)
SODIUM: 140 meq/L (ref 135–145)
Total Bilirubin: 0.7 mg/dL (ref 0.2–1.2)
Total Protein: 7.2 g/dL (ref 6.0–8.3)

## 2018-04-15 LAB — LIPID PANEL
CHOLESTEROL: 120 mg/dL (ref 0–200)
HDL: 59.6 mg/dL (ref >39.00)
LDL CALC: 47 mg/dL (ref 0–99)
NonHDL: 60.54
TRIGLYCERIDES: 70 mg/dL (ref 0.0–149.0)
Total CHOL/HDL Ratio: 2
VLDL: 14 mg/dL (ref 0.0–40.0)

## 2018-04-15 LAB — HEMOGLOBIN A1C: HEMOGLOBIN A1C: 7 % — AB (ref 4.6–6.5)

## 2018-04-15 LAB — VITAMIN B12: VITAMIN B 12: 567 pg/mL (ref 211–911)

## 2018-04-15 LAB — URIC ACID: Uric Acid, Serum: 5.6 mg/dL (ref 4.0–7.8)

## 2018-04-19 ENCOUNTER — Ambulatory Visit: Payer: PPO | Admitting: Family Medicine

## 2018-04-26 ENCOUNTER — Ambulatory Visit: Payer: PPO | Admitting: Family Medicine

## 2018-04-28 ENCOUNTER — Other Ambulatory Visit: Payer: Self-pay | Admitting: Family Medicine

## 2018-04-29 DIAGNOSIS — R0781 Pleurodynia: Secondary | ICD-10-CM | POA: Diagnosis not present

## 2018-04-29 DIAGNOSIS — S20211A Contusion of right front wall of thorax, initial encounter: Secondary | ICD-10-CM | POA: Diagnosis not present

## 2018-05-03 ENCOUNTER — Ambulatory Visit (INDEPENDENT_AMBULATORY_CARE_PROVIDER_SITE_OTHER): Payer: PPO | Admitting: Family Medicine

## 2018-05-03 ENCOUNTER — Encounter: Payer: Self-pay | Admitting: Family Medicine

## 2018-05-03 VITALS — BP 120/72 | HR 69 | Temp 98.5°F | Ht 65.0 in | Wt 187.5 lb

## 2018-05-03 DIAGNOSIS — E1151 Type 2 diabetes mellitus with diabetic peripheral angiopathy without gangrene: Secondary | ICD-10-CM

## 2018-05-03 DIAGNOSIS — E7800 Pure hypercholesterolemia, unspecified: Secondary | ICD-10-CM

## 2018-05-03 DIAGNOSIS — E1351 Other specified diabetes mellitus with diabetic peripheral angiopathy without gangrene: Secondary | ICD-10-CM | POA: Diagnosis not present

## 2018-05-03 DIAGNOSIS — M1A00X1 Idiopathic chronic gout, unspecified site, with tophus (tophi): Secondary | ICD-10-CM

## 2018-05-03 DIAGNOSIS — E1122 Type 2 diabetes mellitus with diabetic chronic kidney disease: Secondary | ICD-10-CM | POA: Diagnosis not present

## 2018-05-03 DIAGNOSIS — E538 Deficiency of other specified B group vitamins: Secondary | ICD-10-CM

## 2018-05-03 DIAGNOSIS — N183 Chronic kidney disease, stage 3 (moderate): Secondary | ICD-10-CM

## 2018-05-03 DIAGNOSIS — I5022 Chronic systolic (congestive) heart failure: Secondary | ICD-10-CM | POA: Diagnosis not present

## 2018-05-03 DIAGNOSIS — E1340 Other specified diabetes mellitus with diabetic neuropathy, unspecified: Secondary | ICD-10-CM | POA: Diagnosis not present

## 2018-05-03 LAB — HM DIABETES FOOT EXAM

## 2018-05-03 NOTE — Assessment & Plan Note (Signed)
Resolved on oral supplement.

## 2018-05-03 NOTE — Assessment & Plan Note (Signed)
Worsened control but adequate.. Will get back on track with low carb idet.

## 2018-05-03 NOTE — Assessment & Plan Note (Signed)
Improved with gabapentin 200 mg at bedtime. Continue current dose.

## 2018-05-03 NOTE — Assessment & Plan Note (Signed)
Well controlled. Continue current medication. Encouraged exercise, weight loss, healthy eating habits.

## 2018-05-03 NOTE — Patient Instructions (Addendum)
Work on low [employer].. Try to decrease sweet tea and orange juice.. change to stevia.
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
## 2018-05-03 NOTE — Assessment & Plan Note (Signed)
Asymptomatic. 

## 2018-05-03 NOTE — Assessment & Plan Note (Signed)
Improved control.

## 2018-05-03 NOTE — Assessment & Plan Note (Signed)
Euvolemic.

## 2018-05-14 ENCOUNTER — Telehealth: Payer: Self-pay | Admitting: Family Medicine

## 2018-05-14 NOTE — Telephone Encounter (Signed)
Pt came in today stating he received a vm about pt needing to come in pt was not sure what the message was. No note to justify. Please advise? Thank you!

## 2018-05-14 NOTE — Telephone Encounter (Signed)
I did not call.

## 2018-05-14 NOTE — Telephone Encounter (Signed)
Unable to see anything regarding this. Please advise.

## 2018-05-15 NOTE — Telephone Encounter (Signed)
Advised patient by telephone that we do not have any record of anyone calling him from this office.

## 2018-06-04 ENCOUNTER — Ambulatory Visit: Payer: Self-pay

## 2018-06-04 NOTE — Telephone Encounter (Signed)
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 States sometimes "I work until [employer] exhausted and I get dizzy and have to sit down." Reports eating well - "I don't drink as much as I should." Once he rest he feels better.Request appointment.Instructed if symptoms worsen, to call back. Reason for Disposition . [1] MODERATE dizziness (e.g., vertigo; feels very unsteady, interferes with normal activities) AND [2] has NOT been evaluated by physician for this  Answer Assessment - Initial Assessment Questions 1. DESCRIPTION: "Describe your dizziness."     Dizzy  2. VERTIGO: "Do you feel like either you or the room is spinning or tilting?"      No 3. LIGHTHEADED: "Do you feel lightheaded?" (e.g., somewhat faint, woozy, weak upon standing)     Lightheaededness 4. SEVERITY: "How bad is it?"  "Can you walk?"   - MILD - Feels unsteady but walking normally.   - MODERATE - Feels very unsteady when walking, but not falling; interferes with normal activities (e.g., school, work) .   - SEVERE - Unable to walk without falling (requires assistance).     Mild 5. ONSET:  "When did the dizziness begin?"     4-5 WEEKS 6. AGGRAVATING FACTORS: "Does anything make it worse?" (e.g., standing, change in head position)     Outside walking 7. CAUSE: "What do you think is causing the dizziness?"     Working 8. RECURRENT SYMPTOM: "Have you had dizziness before?" If so, ask: "When was the last time?" "What happened that time?"     No 9. OTHER SYMPTOMS: "Do you have any other symptoms?" (e.g., headache, weakness, numbness, vomiting, earache)     No 10. PREGNANCY: "Is there any chance you are pregnant?" "When was your last menstrual period?"       n/a  Protocols used: DIZZINESS - VERTIGO-A-AH

## 2018-06-04 NOTE — Telephone Encounter (Signed)
Appt on 8/9.

## 2018-06-04 NOTE — Telephone Encounter (Signed)
Agree with appt.

## 2018-06-07 ENCOUNTER — Ambulatory Visit
Admission: RE | Admit: 2018-06-07 | Discharge: 2018-06-07 | Disposition: A | Discharge status: Home / Self Care | Payer: PPO | Source: Ambulatory Visit | Attending: Family Medicine | Admitting: Family Medicine

## 2018-06-07 ENCOUNTER — Ambulatory Visit (INDEPENDENT_AMBULATORY_CARE_PROVIDER_SITE_OTHER): Payer: PPO | Admitting: Family Medicine

## 2018-06-07 ENCOUNTER — Encounter: Payer: Self-pay | Admitting: Family Medicine

## 2018-06-07 ENCOUNTER — Other Ambulatory Visit: Payer: Self-pay | Admitting: Family Medicine

## 2018-06-07 VITALS — BP 120/70 | HR 68 | Temp 97.9°F | Ht 65.0 in | Wt 187.8 lb

## 2018-06-07 DIAGNOSIS — R51 Headache: Secondary | ICD-10-CM | POA: Insufficient documentation

## 2018-06-07 DIAGNOSIS — R4020 Unspecified coma: Secondary | ICD-10-CM

## 2018-06-07 DIAGNOSIS — R110 Nausea: Secondary | ICD-10-CM | POA: Insufficient documentation

## 2018-06-07 DIAGNOSIS — R519 Headache, unspecified: Secondary | ICD-10-CM

## 2018-06-07 DIAGNOSIS — R42 Dizziness and giddiness: Secondary | ICD-10-CM

## 2018-06-07 DIAGNOSIS — S069X9A Unspecified intracranial injury with loss of consciousness of unspecified duration, initial encounter: Secondary | ICD-10-CM | POA: Diagnosis not present

## 2018-06-07 DIAGNOSIS — W28XXXA Contact with powered lawn mower, initial encounter: Secondary | ICD-10-CM | POA: Insufficient documentation

## 2018-06-07 NOTE — Patient Instructions (Addendum)
Stop gabapentin. Please stop at the front desk to set up referral.
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
## 2018-08-05 DIAGNOSIS — L821 Other seborrheic keratosis: Secondary | ICD-10-CM | POA: Diagnosis not present

## 2018-08-05 DIAGNOSIS — L820 Inflamed seborrheic keratosis: Secondary | ICD-10-CM | POA: Diagnosis not present

## 2018-08-05 DIAGNOSIS — L570 Actinic keratosis: Secondary | ICD-10-CM | POA: Diagnosis not present

## 2018-08-05 DIAGNOSIS — I781 Nevus, non-neoplastic: Secondary | ICD-10-CM | POA: Diagnosis not present

## 2018-08-05 DIAGNOSIS — L814 Other melanin hyperpigmentation: Secondary | ICD-10-CM | POA: Diagnosis not present

## 2018-09-25 ENCOUNTER — Other Ambulatory Visit: Payer: Self-pay | Admitting: Family Medicine

## 2018-10-09 ENCOUNTER — Other Ambulatory Visit: Payer: Self-pay | Admitting: Family Medicine

## 2018-10-21 ENCOUNTER — Other Ambulatory Visit: Payer: Self-pay | Admitting: Family Medicine

## 2018-11-01 DIAGNOSIS — H20013 Primary iridocyclitis, bilateral: Secondary | ICD-10-CM | POA: Diagnosis not present

## 2018-11-04 DIAGNOSIS — D1800 Hemangioma unspecified site: Secondary | ICD-10-CM | POA: Diagnosis not present

## 2018-11-04 DIAGNOSIS — L820 Inflamed seborrheic keratosis: Secondary | ICD-10-CM | POA: Diagnosis not present

## 2018-11-13 DIAGNOSIS — H1033 Unspecified acute conjunctivitis, bilateral: Secondary | ICD-10-CM | POA: Diagnosis not present

## 2018-11-21 ENCOUNTER — Telehealth: Payer: Self-pay | Admitting: Family Medicine

## 2018-11-21 DIAGNOSIS — E538 Deficiency of other specified B group vitamins: Secondary | ICD-10-CM

## 2018-11-21 DIAGNOSIS — E1151 Type 2 diabetes mellitus with diabetic peripheral angiopathy without gangrene: Secondary | ICD-10-CM

## 2018-11-21 NOTE — Telephone Encounter (Signed)
-----  Message from Eustace Pen, LPN sent at 06/07/9832  4:25 PM EST ----- Regarding: Labs 1/24 Lab orders needed. Thank you.

## 2018-11-22 ENCOUNTER — Ambulatory Visit: Payer: PPO

## 2018-11-22 ENCOUNTER — Encounter: Payer: PPO | Admitting: Family Medicine

## 2018-11-22 ENCOUNTER — Encounter: Payer: Self-pay | Admitting: *Deleted

## 2018-11-22 ENCOUNTER — Ambulatory Visit (INDEPENDENT_AMBULATORY_CARE_PROVIDER_SITE_OTHER): Payer: PPO

## 2018-11-22 ENCOUNTER — Ambulatory Visit (INDEPENDENT_AMBULATORY_CARE_PROVIDER_SITE_OTHER): Payer: PPO | Admitting: Family Medicine

## 2018-11-22 VITALS — BP 120/72 | HR 73 | Temp 97.6°F | Ht 64.0 in | Wt 183.5 lb

## 2018-11-22 DIAGNOSIS — E1151 Type 2 diabetes mellitus with diabetic peripheral angiopathy without gangrene: Secondary | ICD-10-CM

## 2018-11-22 DIAGNOSIS — I5022 Chronic systolic (congestive) heart failure: Secondary | ICD-10-CM

## 2018-11-22 DIAGNOSIS — J84112 Idiopathic pulmonary fibrosis: Secondary | ICD-10-CM

## 2018-11-22 DIAGNOSIS — E538 Deficiency of other specified B group vitamins: Secondary | ICD-10-CM | POA: Diagnosis not present

## 2018-11-22 DIAGNOSIS — M1A00X1 Idiopathic chronic gout, unspecified site, with tophus (tophi): Secondary | ICD-10-CM | POA: Diagnosis not present

## 2018-11-22 DIAGNOSIS — Z0000 Encounter for general adult medical examination without abnormal findings: Secondary | ICD-10-CM

## 2018-11-22 DIAGNOSIS — E7800 Pure hypercholesterolemia, unspecified: Secondary | ICD-10-CM | POA: Diagnosis not present

## 2018-11-22 DIAGNOSIS — I712 Thoracic aortic aneurysm, without rupture, unspecified: Secondary | ICD-10-CM

## 2018-11-22 DIAGNOSIS — N183 Chronic kidney disease, stage 3 (moderate): Secondary | ICD-10-CM

## 2018-11-22 DIAGNOSIS — I10 Essential (primary) hypertension: Secondary | ICD-10-CM | POA: Diagnosis not present

## 2018-11-22 DIAGNOSIS — F0390 Unspecified dementia without behavioral disturbance: Secondary | ICD-10-CM

## 2018-11-22 DIAGNOSIS — F03A0 Unspecified dementia, mild, without behavioral disturbance, psychotic disturbance, mood disturbance, and anxiety: Secondary | ICD-10-CM

## 2018-11-22 DIAGNOSIS — E1122 Type 2 diabetes mellitus with diabetic chronic kidney disease: Secondary | ICD-10-CM | POA: Diagnosis not present

## 2018-11-22 DIAGNOSIS — E1351 Other specified diabetes mellitus with diabetic peripheral angiopathy without gangrene: Secondary | ICD-10-CM

## 2018-11-22 DIAGNOSIS — E1340 Other specified diabetes mellitus with diabetic neuropathy, unspecified: Secondary | ICD-10-CM

## 2018-11-22 LAB — MICROALBUMIN / CREATININE URINE RATIO
Creatinine,U: 118.2 mg/dL
Microalb Creat Ratio: 2.1 mg/g (ref 0.0–30.0)
Microalb, Ur: 2.4 mg/dL — ABNORMAL HIGH (ref 0.0–1.9)

## 2018-11-22 LAB — COMPREHENSIVE METABOLIC PANEL
ALT: 18 U/L (ref 0–53)
AST: 25 U/L (ref 0–37)
Albumin: 4.3 g/dL (ref 3.5–5.2)
Alkaline Phosphatase: 63 U/L (ref 39–117)
BUN: 19 mg/dL (ref 6–23)
CO2: 30 mEq/L (ref 19–32)
CREATININE: 1.17 mg/dL (ref 0.40–1.50)
Calcium: 9.7 mg/dL (ref 8.4–10.5)
Chloride: 99 mEq/L (ref 96–112)
GFR: 59.55 mL/min — AB (ref >60.00)
Glucose, Bld: 110 mg/dL — ABNORMAL HIGH (ref 70–99)
POTASSIUM: 4.3 meq/L (ref 3.5–5.1)
Sodium: 138 mEq/L (ref 135–145)
Total Bilirubin: 0.8 mg/dL (ref 0.2–1.2)
Total Protein: 7.5 g/dL (ref 6.0–8.3)

## 2018-11-22 LAB — LIPID PANEL
Cholesterol: 123 mg/dL (ref 0–200)
HDL: 56.2 mg/dL (ref >39.00)
LDL Cholesterol: 52 mg/dL (ref 0–99)
NonHDL: 66.31
Total CHOL/HDL Ratio: 2
Triglycerides: 73 mg/dL (ref 0.0–149.0)
VLDL: 14.6 mg/dL (ref 0.0–40.0)

## 2018-11-22 LAB — HEMOGLOBIN A1C: Hgb A1c MFr Bld: 6.6 % — ABNORMAL HIGH (ref 4.6–6.5)

## 2018-11-22 LAB — VITAMIN B12: Vitamin B-12: 485 pg/mL (ref 211–911)

## 2018-11-22 MED ORDER — PREGABALIN 25 MG PO CAPS: 25.0000 mg | ORAL_CAPSULE | Freq: Every day | ORAL | 3 refills | Status: DC

## 2018-11-22 NOTE — Assessment & Plan Note (Signed)
Seen on ECHO EF 45% in 2015 Resolved on repeat ECHO with nml EF in 2016 Pt asymptomatic except stable SOB which may in part be from pulmonary fibrosis.

## 2018-11-22 NOTE — Assessment & Plan Note (Addendum)
?  SE to gabapentin. Start trial of low dose lyrica. Pt wants to take in day but if SE switch to night... will need BID if tolerated. Follow up in 3 weeks.

## 2018-11-22 NOTE — Assessment & Plan Note (Signed)
Due for re-eval.

## 2018-11-22 NOTE — Progress Notes (Signed)
PCP notes:   Health maintenance:  A1C - completed Microalbumin - completed  Abnormal screenings:   Hearing - failed  Hearing Screening   _0  _1  _2  _3  _4  _5  _6  _7  _8   Right ear:   40 40 40  0    Left ear:   40 40 0  0      Mini-Cog score: 19/20 MMSE - Mini Mental State Exam 11/22/2018 11/15/2017 09/29/2016  Orientation to time _9 Orientation to Place _10 Registration _11 Attention/ Calculation 0 0 0  Recall _12 Recall-comments unable to recall 1 of 3 words unable to recall 1 of 3 words -  Language- name 2 objects 0 0 0  Language- repeat _13 Language- follow 3 step command _14 Language- read & follow direction 0 0 0  Write a sentence 0 0 0  Copy design 0 0 0  Total score _15 Patient concerns:   Patient wants to discontinue use of Atorvastatin due to increased joint pain. Will discuss with PCP at CPE appt.   Nurse concerns:  None  Next PCP appt:   11/22/18 @ 0900

## 2018-11-22 NOTE — Assessment & Plan Note (Signed)
Stable SOb.. If progressive will return to see pulm. Pt does not wish to see pulm at this time.

## 2018-11-22 NOTE — Progress Notes (Signed)
Subjective:    Patient ID: Caleb Reeder Sr., male    DOB: January 26, 1934, 85 y.o.   MRN: [id number]  HPI The patient presents for complete physical and review of chronic health problems. He/She also has the following acute concerns today:  He continued to have pain in feet... buring in feet at night  The patient saw Candis Musa, LPN for medicare wellness. Note reviewed in detail and important notes copied below.  11/22/18    B12 def: pending  CHF: Euvolemic in office today, no swelling in feet.   No increase in SOB with exertion, fairly stable. No chest pain.   NWG:NFAOZ 3:  Diabetes:  Pending labs.. previously well controlled Lab Results  Component Value Date   HGBA1C 7.0 (H) 04/15/2018  Using medications without difficulties: Hypoglycemic episodes: Hyperglycemic episodes: Feet problems: neuropathy Blood Sugars averaging: eye exam within last year:  microalbumin pending.  Elevated Cholesterol: pending on atorvastatin 20 mg dialy Using medications without problems: Muscle aches:  Diet compliance: Exercise: Other complaints:  Mild dementia: stable, MMSE stable as well.. see AMW.  Hypertension:    Good control on HCTZ. BP Readings from Last 3 Encounters:  11/22/18 120/72  11/22/18 120/72  06/07/18 120/70  Using medication without problems or lightheadedness:  Chest pain with exertion: Edema: Short of breath: Average home BPs: Other issues:  Gout  No flares on allopurinol. Lab Results  Component Value Date   LABURIC 5.6 04/15/2018    idiopathic pulmonary fibrosis: Stable SOb.. If progressive will return to see pulm. Pt does not wish to see pulm at this time.   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 Blood pressure 120/72, pulse 73, temperature 97.6 F (36.4 C), temperature source  Oral, height _0  (1.626 m), weight 183 lb 8 oz (83.2 kg), SpO2 94 %.  Review of Systems  Constitutional: Negative for fatigue and fever.  HENT: Negative for ear pain.   Eyes: Negative for pain.  Respiratory: Negative for cough and shortness of breath.   Cardiovascular: Negative for chest pain, palpitations and leg swelling.  Gastrointestinal: Negative for abdominal pain.  Genitourinary: Negative for dysuria.  Musculoskeletal: Negative for arthralgias.  Neurological: Negative for syncope, light-headedness and headaches.  Psychiatric/Behavioral: Negative for dysphoric mood.       Objective:   Physical Exam Constitutional:      Appearance: He is well-developed. He is obese.  HENT:     Head: Normocephalic.     Right Ear: Hearing normal.     Left Ear: Hearing normal.     Nose: Nose normal.  Neck:     Thyroid: No thyroid mass or thyromegaly.     Vascular: No carotid bruit.     Trachea: Trachea normal.  Cardiovascular:     Rate and Rhythm: Normal rate and regular rhythm.     Pulses:          Posterior tibial pulses are 1+ on the right side and 2+ on the left side.     Heart sounds: Heart sounds not distant. No murmur. No friction rub. No gallop.      Comments: No peripheral edema Pulmonary:     Effort: Pulmonary effort is normal. No respiratory distress.     Breath sounds: Examination of the right-lower field reveals rales. Examination of the left-lower field reveals rales. Rales present.  Musculoskeletal:  Right lower leg: No edema.     Left lower leg: No edema.  Skin:    General: Skin is warm and dry.     Findings: No rash.  Psychiatric:        Speech: Speech normal.        Behavior: Behavior normal.        Thought Content: Thought content normal.       Diabetic foot exam: Normal inspection No skin breakdown No calluses  Normal DP pulses Normal sensation to light touch and monofilament Nails normal     Assessment & Plan:  The patient's preventative  maintenance and recommended screening tests for an annual wellness exam were reviewed in full today. Brought up to date unless services declined.  Counselled on the importance of diet, exercise, and its role in overall health and mortality. The patient's FH and SH was reviewed, including their home life, tobacco status, and drug and alcohol status.   UPTODATE with vaccines  PSA not indicated due to age.  Colon Ca [place] not indicated given age, last 2013

## 2018-11-22 NOTE — Assessment & Plan Note (Signed)
Stable memory issues.

## 2018-11-22 NOTE — Patient Instructions (Signed)
Mr. Caleb Bryant , Thank you for taking time to come for your Medicare Wellness Visit. I appreciate your ongoing commitment to your health goals. Please review the following plan we discussed and let me know if I can assist you in the future.   These are the goals we discussed: Goals    . Follow up with Primary Care Provider     Starting 11/22/2018, I will continue to take medications as prescribed and to keep appointments as scheduled with PCP.        This is a list of the screening recommended for you and due dates:  Health Maintenance  Topic Date Due  . Complete foot exam   05/04/2019  . Hemoglobin A1C  05/23/2019  . Eye exam for diabetics  10/31/2019  . Urine Protein Check  11/23/2019  . Tetanus Vaccine  05/13/2020  . Flu Shot  Completed  . Pneumonia vaccines  Completed   Preventive Care for Adults  A healthy lifestyle and preventive care can promote health and wellness. Preventive health guidelines for adults include the following key practices.  . A routine yearly physical is a good way to check with your health care provider about your health and preventive screening. It is a chance to share any concerns and updates on your health and to receive a thorough exam.  . Visit your dentist for a routine exam and preventive care every 6 months. Brush your teeth twice a day and floss once a day. Good oral hygiene prevents tooth decay and gum disease.  . The frequency of eye exams is based on your age, health, family medical history, use  of contact lenses, and other factors. Follow your health care provider's recommendations for frequency of eye exams.  . Eat a healthy diet. Foods like vegetables, fruits, whole grains, low-fat dairy products, and lean protein foods contain the nutrients you need without too many calories. Decrease your intake of foods high in solid fats, added sugars, and salt. Eat the right amount of calories for you. Get information about a proper diet from your health  care provider, if necessary.  . Regular physical exercise is one of the most important things you can do for your health. Most adults should get at least 150 minutes of moderate-intensity exercise (any activity that increases your heart rate and causes you to sweat) each week. In addition, most adults need muscle-strengthening exercises on 2 or more days a week.  Silver Sneakers may be a benefit available to you. To determine eligibility, you may visit the website: www.silversneakers.com or contact program at 3167682663 Mon-Fri between 8AM-8PM.   . Maintain a healthy weight. The body mass index (BMI) is a screening tool to identify possible weight problems. It provides an estimate of body fat based on height and weight. Your health care provider can find your BMI and can help you achieve or maintain a healthy weight.   For adults 20 years and older: ? A BMI below 18.5 is considered underweight. ? A BMI of 18.5 to 24.9 is normal. ? A BMI of 25 to 29.9 is considered overweight. ? A BMI of 30 and above is considered obese.   . Maintain normal blood lipids and cholesterol levels by exercising and minimizing your intake of saturated fat. Eat a balanced diet with plenty of fruit and vegetables. Blood tests for lipids and cholesterol should begin at age 42 and be repeated every 5 years. If your lipid or cholesterol levels are high, you are over 50, or  you are at high risk for heart disease, you may need your cholesterol levels checked more frequently. Ongoing high lipid and cholesterol levels should be treated with medicines if diet and exercise are not working.  . If you smoke, find out from your health care provider how to quit. If you do not use tobacco, please do not start.  . If you choose to drink alcohol, please do not consume more than 2 drinks per day. One drink is considered to be 12 ounces (355 mL) of beer, 5 ounces (148 mL) of wine, or 1.5 ounces (44 mL) of liquor.  . If you are 38-106  years old, ask your health care provider if you should take aspirin to prevent strokes.  . Use sunscreen. Apply sunscreen liberally and repeatedly throughout the day. You should seek shade when your shadow is shorter than you. Protect yourself by wearing long sleeves, pants, a wide-brimmed hat, and sunglasses year round, whenever you are outdoors.  . Once a month, do a whole body skin exam, using a mirror to look at the skin on your back. Tell your health care provider of new moles, moles that have irregular borders, moles that are larger than a pencil eraser, or moles that have changed in shape or color.

## 2018-11-22 NOTE — Patient Instructions (Addendum)
Start a low dose trial of lyrica generic daily.  We will call to set up blood flow eval to legs.

## 2018-11-22 NOTE — Progress Notes (Signed)
Subjective:   Caleb Tullier Moncada Sr. is a 85 y.o. male who presents for Medicare Annual/Subsequent preventive examination.  Review of Systems:  N/A Cardiac Risk Factors include: advanced age (>42mn, >>84women);male gender;diabetes mellitus;dyslipidemia;hypertension;obesity (BMI >30kg/m2)     Objective:    Vitals: BP 120/72 (BP Location: Right Arm, Patient Position: Sitting, Cuff Size: Normal)   Pulse 73   Temp 97.6 F (36.4 C) (Oral)   Ht 5' 4" (1.626 m) Comment: no shoes  Wt 183 lb 8 oz (83.2 kg)   SpO2 94%   BMI 31.50 kg/m   Body mass index is 31.5 kg/m.  Advanced Directives 11/22/2018 11/15/2017 09/29/2016 11/26/2013 11/05/2013  Does Patient Have a Medical Advance Directive? No No No Patient does not have advance directive Patient does not have advance directive;Patient would not like information  Would patient like information on creating a medical advance directive? No - Patient declined No - Patient declined - - -    Tobacco Social History   Tobacco Use  Smoking Status Former Smoker  . Packs/day: 3.00  . Years: 30.00  . Pack years: 90.00  . Types: Cigarettes  Smokeless Tobacco Former USystems developer . Types: Chew  Tobacco Comment   11/04/2013 "quit smoking in the 1980's or so; quit chewing before I quit smoking"     Counseling given: No Comment: 11/04/2013 "quit smoking in the 1980's or so; quit chewing before I quit smoking"   Clinical Intake:  Pre-visit preparation completed: Yes  Pain : 0-10 Pain Score: 4  Pain Type: Chronic pain Pain Location: Shoulder Pain Orientation: Left, Right Pain Onset: More than a month ago Pain Frequency: Constant     Nutritional Status: BMI > 30  Obese Nutritional Risks: None Diabetes: Yes CBG done?: No Did pt. bring in CBG monitor from home?: No  How often do you need to have someone help you when you read instructions, pamphlets, or other written materials from your doctor or pharmacy?: 1 - Never  Interpreter Needed?:  No  Comments: pt lives with spouse Information entered by :: LPinson, LPN  Past Medical History:  Diagnosis Date  . Anxiety   . [place]rial flutter ([place]    "just dx'd today" (11/04/2013)  . Benign prostatic hypertrophy   . Borderline diabetes   . Colon, diverticulosis   . Exertional shortness of breath   . Gout   . Hyperlipidemia   . Hypertension   . Osteoarthritis    "all over" (11/04/2013)  . P[place] disease) [place]    Past Surgical History:  Procedure Laterality Date  . albation  11/04/13  . ANKLE FRACTURE SURGERY Right   . APPENDECTOMY  1997   "days before 1st colon OR"  . ATRIAL FLUTTER ABLATION N/A 11/26/2013   Procedure: ATRIAL FLUTTER ABLATION;  Surgeon: GEvans Lance MD;  [facility];  Service: Cardiovascular;  Laterality: N/A;  . CATARACT EXTRACTION, BILATERAL Bilateral   . CHOLECYSTECTOMY  12/03  . COLON SURGERY     "diverticulitis"  . COLONOSCOPY WITH ESOPHAGOGASTRODUODENOSCOPY (EGD)  12/00   gastic polyp   . COLONOSCOPY WITH ESOPHAGOGASTRODUODENOSCOPY (EGD)  11/07,12/2011   gastritis, 2 gastic polyps  . COLOSTOMY  1997  . COLOSTOMY REVERSAL     "think I wore it 4 months" (11/04/2013)  . CORNEAL TRANSPLANT Right   . CORONARY ANGIOGRAM  11/05/2013   Procedure: CORONARY ANGIOGRAM;  Surgeon: MWellington Hampshire MD;  [facility];  Service: Cardiovascular;;  . HERNIA REPAIR  11/05/14  ventral hernia  . INGUINAL HERNIA REPAIR Left   . PARTIAL COLECTOMY  1997   "diverticulitis; busted; Byrnett"  . stress cardiolite negative  9/02   EF 60%  . TONSILLECTOMY AND ADENOIDECTOMY     child  . VENTRAL HERNIA REPAIR  11/07   Byrnett   Family History  Problem Relation Age of Onset  . Coronary artery disease Sister   . Heart failure Mother    Social History   Socioeconomic History  . Marital status: Married    Spouse name: Not on file  . Number of children: 3  . Years of education: Not on file  . Highest education level: Not on file   Occupational History    Employer: RETIRED  Social Needs  . Financial resource strain: Not on file  . Food insecurity:    Worry: Not on file    Inability: Not on file  . Transportation needs:    Medical: Not on file    Non-medical: Not on file  Tobacco Use  . Smoking status: Former Smoker    Packs/day: 3.00    Years: 30.00    Pack years: 90.00    Types: Cigarettes  . Smokeless tobacco: Former Systems developer    Types: Chew  . Tobacco comment: 11/04/2013 "quit smoking in the 1980's or so; quit chewing before I quit smoking"  Substance and Sexual Activity  . Alcohol use: No    Comment: 11/04/2013 "quit driking in 1980's; was called a weekend alcoholic"  . Drug use: No  . Sexual activity: Never  Lifestyle  . Physical activity:    Days per week: Not on file    Minutes per session: Not on file  . Stress: Not on file  Relationships  . Social connections:    Talks on phone: Not on file    Gets together: Not on file    Attends religious service: Not on file    Active member of club or organization: Not on file    Attends meetings of clubs or organizations: Not on file    Relationship status: Not on file  Other Topics Concern  . Not on file  Social History Narrative   Still does lawn work.    No living will, Wife is HCPOA. (Reviewed 2015)    Outpatient Encounter Medications as of 11/22/2018  Medication Sig  . allopurinol (ZYLOPRIM) 100 MG tablet TAKE 1 TABLET(100 MG) BY MOUTH DAILY  . atorvastatin (LIPITOR) 20 MG tablet TAKE 1 TABLET(20 MG) BY MOUTH DAILY  . Cyanocobalamin (B-12 PO) Take 1 tablet by mouth daily.  . hydrochlorothiazide (HYDRODIURIL) 25 MG tablet TAKE 1 TABLET(25 MG) BY MOUTH DAILY  . pregabalin (LYRICA) 25 MG capsule Take 1 capsule (25 mg total) by mouth daily.   No facility-administered encounter medications on file as of 11/22/2018.     Activities of Daily Living In your present state of health, do you have any difficulty performing the following activities: 11/22/2018   Hearing? Y  Vision? N  Difficulty concentrating or making decisions? Y  Walking or climbing stairs? N  Dressing or bathing? N  Doing errands, shopping? N  Preparing Food and eating ? N  Using the Toilet? N  In the past six months, have you accidently leaked urine? N  Do you have problems with loss of bowel control? N  Managing your Medications? N  Managing your Finances? N  Housekeeping or managing your Housekeeping? N  Some recent data might be hidden    Patient Care  Team: [name], MD as PCP - General Byrnett, Forest Gleason, MD (General Surgery) Venia Carbon, MD as Referring Physician (Internal Medicine) Agapito Games as Consulting Physician (Optometry)   Assessment:   This is a routine wellness examination for [facility].   Hearing Screening   125Hz 250Hz 500Hz 1000Hz 2000Hz 3000Hz 4000Hz 6000Hz 8000Hz  Right ear:   40 40 40  0    Left ear:   40 40 0  0    Vision Screening Comments: Vision exam in 2020 with Dr. Marvel Plan   Exercise Activities and Dietary recommendations Current Exercise Habits: The patient does not participate in regular exercise at present, Exercise limited by: None identified  Goals    . Follow up with Primary Care Provider     Starting 11/22/2018, I will continue to take medications as prescribed and to keep appointments as scheduled with PCP.        Fall Risk Fall Risk  11/22/2018 11/15/2017 09/29/2016 10/14/2015 06/22/2014  Falls in the past year? 0 No No Yes No  Number falls in past yr: - - - 1 -  Injury with Fall? - - - No -   Depression Screen PHQ 2/9 Scores 11/22/2018 11/15/2017 09/29/2016 10/14/2015  PHQ - 2 Score 0 0 0 0  PHQ- 9 Score 0 0 - -    Cognitive Function MMSE - Mini Mental State Exam 11/22/2018 11/15/2017 09/29/2016  Orientation to time _0 Orientation to Place _1 Registration _2 Attention/ Calculation 0 0 0  Recall _3 Recall-comments unable to recall 1 of 3 words unable to recall 1 of 3 words -   Language- name 2 objects 0 0 0  Language- repeat _4 Language- follow 3 step command _5 Language- read & follow direction 0 0 0  Write a sentence 0 0 0  Copy design 0 0 0  Total score _6 PLEASE NOTE: A Mini-Cog screen was completed. Maximum score is 20. A value of 0 denotes this part of Folstein MMSE was not completed or the patient failed this part of the Mini-Cog screening.   Mini-Cog Screening Orientation to Time - Max 5 pts Orientation to Place - Max 5 pts Registration - Max 3 pts Recall - Max 3 pts Language Repeat - Max 1 pts Language Follow 3 Step Command - Max 3 pts   Immunization History  Administered Date(s) Administered  . Influenza Split 07/31/2011, 08/08/2012, 09/01/2013  . Influenza Whole 10/05/2004, 08/16/2007, 08/11/2008, 08/06/2009  . Influenza, High Dose Seasonal PF 07/27/2016, 08/09/2017  . Influenza,inj,Quad PF,6+ Mos 06/22/2014, 10/14/2015  . Influenza-Unspecified 08/30/2018  . Pneumococcal Conjugate-13 06/22/2014  . Pneumococcal Polysaccharide-23 04/14/2009  . Td 10/31/1997, 05/13/2010    Screening Tests Health Maintenance  Topic Date Due  . FOOT EXAM  05/04/2019  . HEMOGLOBIN A1C  05/23/2019  . OPHTHALMOLOGY EXAM  10/31/2019  . URINE MICROALBUMIN  11/23/2019  . TETANUS/TDAP  05/13/2020  . INFLUENZA VACCINE  Completed  . PNA vac Low Risk Adult  Completed     Plan:     I have personally reviewed, addressed, and noted the following in the patient's chart:  A. Medical and social history B. Use of alcohol, tobacco or illicit drugs  C. Current medications and supplements D. Functional ability and status E.  Nutritional status F.  Physical activity G. Advance directives H. List of other physicians  I.  Hospitalizations, surgeries, and ER visits in previous 12 months J.  Vitals K. Screenings to include hearing, vision, cognitive, depression L. Referrals and appointments - none  In addition, I have reviewed and discussed with  patient certain preventive protocols, quality metrics, and best practice recommendations. A written personalized care plan for preventive services as well as general preventive health recommendations were provided to patient.  See attached scanned questionnaire for additional information.   Signed,   Lindell Noe, MHA, BS, LPN Health Coach

## 2018-11-22 NOTE — Assessment & Plan Note (Signed)
No recent flare and uric acid >< 6 on allopurinol in last 6 months.

## 2018-11-22 NOTE — Assessment & Plan Note (Signed)
Due for re-eval on diet control. Encouraged exercise, weight loss, healthy eating habits.

## 2018-11-22 NOTE — Progress Notes (Signed)
I reviewed health advisor's note, was available for consultation, and agree with documentation and plan.

## 2018-11-22 NOTE — Assessment & Plan Note (Signed)
Re-eval on supplement.

## 2018-11-22 NOTE — Assessment & Plan Note (Addendum)
Was asymptomatic so not on medication, treating risk factors.. some of foot pain may be related.Marland Kitchen re-eval with ABIs

## 2018-11-22 NOTE — Progress Notes (Signed)
PCP notes:   Health maintenance:  No gaps identified  Abnormal screenings:   Hearing - failed  Hearing Screening   _0  _1  _2  _3  _4  _5  _6  _7  _8   Right ear:   40 40 40  0    Left ear:   40 40 0  0    Vision Screening Comments: Vision exam in 2020 with Dr. Marvel Plan  Mini-Cog score: 19/20 MMSE - Mini Mental State Exam 11/22/2018 11/15/2017 09/29/2016  Orientation to time _9 Orientation to Place _10 Registration _11 Attention/ Calculation 0 0 0  Recall _12 Recall-comments unable to recall 1 of 3 words unable to recall 1 of 3 words -  Language- name 2 objects 0 0 0  Language- repeat _13 Language- follow 3 step command _14 Language- read & follow direction 0 0 0  Write a sentence 0 0 0  Copy design 0 0 0  Total score _15 Patient concerns:   Patient wants to discontinue Atorvastatin because he is having increased joint pain in shoulders, hips, and knees  Nurse concerns:  None  Next PCP appt:   Today @ 9:00

## 2018-11-22 NOTE — Assessment & Plan Note (Signed)
Discussed need for follow up. Pt does not want to re-eval, he understands risk and would not do surgery no matter what.

## 2018-11-22 NOTE — Assessment & Plan Note (Signed)
Well controlled. Continue current medication.  

## 2018-12-12 ENCOUNTER — Ambulatory Visit (INDEPENDENT_AMBULATORY_CARE_PROVIDER_SITE_OTHER): Payer: PPO | Admitting: Family Medicine

## 2018-12-12 ENCOUNTER — Encounter: Payer: Self-pay | Admitting: Family Medicine

## 2018-12-12 DIAGNOSIS — E1340 Other specified diabetes mellitus with diabetic neuropathy, unspecified: Secondary | ICD-10-CM

## 2018-12-12 MED ORDER — PREGABALIN 50 MG PO CAPS: 50.0000 mg | ORAL_CAPSULE | Freq: Every day | ORAL | 3 refills | Status: DC

## 2018-12-12 NOTE — Progress Notes (Signed)
   Subjective:    Patient ID: Caleb Maimone Sr., male    DOB: [birth year], 85 y.o.   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   At last appt on 1/24.. started him on low dose lyrica 25 mg daily... he has had no side effects.  He still has sharp pain in bottom feet and burning pain in soles but  maybe a little better.   ABIs scheduled at  12/24/2018 10:30 AM at [facility] History /Family History/Past Medical History reviewed in detail and updated in EMR if needed. Blood pressure 108/70, pulse 65, temperature (!) 97.5 F (36.4 C), temperature source Oral, resp. rate 16, height _0  (1.626 m), weight 189 lb (85.7 kg), SpO2 94 %.  Review of Systems  Constitutional: Negative for fatigue and fever.  HENT: Negative for ear pain.   Eyes: Negative for pain.  Respiratory: Negative for cough and shortness of breath.   Cardiovascular: Negative for chest pain, palpitations and leg swelling.  Gastrointestinal: Negative for abdominal pain.  Genitourinary: Negative for dysuria.  Musculoskeletal: Negative for arthralgias.  Neurological: Negative for syncope, light-headedness and headaches.  Psychiatric/Behavioral: Negative for dysphoric mood.       Objective:   Physical Exam        Assessment & Plan:

## 2018-12-12 NOTE — Assessment & Plan Note (Signed)
NO SE, possible slight improvement from low dose lyrica. Increase  To 50 mg.. will plan tolerate up in 2 weeks if no SE. Moving slowly given pt SE to gabapentin in past.

## 2018-12-12 NOTE — Patient Instructions (Signed)
Increase dose of lyrica to 50 mg daily. Call if any side effects.  Keep appt for blood flow test on 12/24/2018 10:30 AM  at [facility].

## 2018-12-13 ENCOUNTER — Emergency Department
Admission: EM | Admit: 2018-12-13 | Discharge: 2018-12-13 | Disposition: A | Discharge status: Home / Self Care | Payer: PPO | Attending: Emergency Medicine | Admitting: Emergency Medicine

## 2018-12-13 ENCOUNTER — Other Ambulatory Visit: Payer: Self-pay

## 2018-12-13 ENCOUNTER — Emergency Department: Payer: PPO

## 2018-12-13 DIAGNOSIS — F17210 Nicotine dependence, cigarettes, uncomplicated: Secondary | ICD-10-CM | POA: Diagnosis not present

## 2018-12-13 DIAGNOSIS — E1140 Type 2 diabetes mellitus with diabetic neuropathy, unspecified: Secondary | ICD-10-CM | POA: Insufficient documentation

## 2018-12-13 DIAGNOSIS — N183 Chronic kidney disease, stage 3 (moderate): Secondary | ICD-10-CM | POA: Insufficient documentation

## 2018-12-13 DIAGNOSIS — I5022 Chronic systolic (congestive) heart failure: Secondary | ICD-10-CM | POA: Diagnosis not present

## 2018-12-13 DIAGNOSIS — R040 Epistaxis: Secondary | ICD-10-CM | POA: Insufficient documentation

## 2018-12-13 DIAGNOSIS — E1122 Type 2 diabetes mellitus with diabetic chronic kidney disease: Secondary | ICD-10-CM | POA: Insufficient documentation

## 2018-12-13 DIAGNOSIS — Z79899 Other long term (current) drug therapy: Secondary | ICD-10-CM | POA: Insufficient documentation

## 2018-12-13 DIAGNOSIS — I130 Hypertensive heart and chronic kidney disease with heart failure and stage 1 through stage 4 chronic kidney disease, or unspecified chronic kidney disease: Secondary | ICD-10-CM | POA: Insufficient documentation

## 2018-12-13 DIAGNOSIS — R51 Headache: Secondary | ICD-10-CM | POA: Insufficient documentation

## 2018-12-13 MED ORDER — AYR SALINE NASAL NA GEL: 1.0000 "application " | Freq: Four times a day (QID) | NASAL | 3 refills | Status: DC

## 2018-12-13 MED ORDER — OXYMETAZOLINE HCL 0.05 % NA SOLN
1.0000 | Freq: Once | NASAL | Status: AC
  Administered 2018-12-13: 1 via NASAL
  Filled 2018-12-13: qty 30

## 2018-12-13 NOTE — ED Notes (Addendum)
See triage note  Presents with nosebleed earlier today    States he was able stop the bleeding  And then restarted   Denies any trauma.describes pressure in top of head  Nose clamp removed  No bleeding on arrival to treatment room

## 2018-12-13 NOTE — ED Triage Notes (Signed)
Pt c/o bleeding from the right nare for the past 10mn. Nose clamp applied in triage. Denies being on any blood thinners. Pt admitted to pulling out the clot.

## 2018-12-13 NOTE — ED Provider Notes (Signed)
[facility] Emergency Department Provider Note   ____________________________________________   First MD Initiated Contact with Patient 12/13/18 1243     (approximate)  I have reviewed the triage vital signs and the nursing notes.   HISTORY  Chief Complaint Epistaxis    HPI Caleb Zenon Fritchman Sr. is a 85 y.o. male patient complain of epistaxis from the right nare.  Patient stated bleeding continued for 20 minutes before he decided come to the ED.  Patient admitted that the bleeding was clotted and he pulled out a clot prior to arrival.  Patient denies aspirin use or other blood thinners.  Patient state severe headache preceded the nosebleed.  Wife states patient face was "flushed".  Patient denies vision disturbance, vertigo, or weakness.  A nasal clamp was applied in triage.    Past Medical History:  Diagnosis Date  . Anxiety   . Atrial flutter ([place])    "just dx'd today" (11/04/2013)  . Benign prostatic hypertrophy   . Borderline diabetes   . Colon, diverticulosis   . Exertional shortness of breath   . Gout   . Hyperlipidemia   . Hypertension   . Osteoarthritis    "all over" (11/04/2013)  . PVD (peripheral vascular disease) [facility])     Patient Active Problem List   Diagnosis Date Noted  . GERD (gastroesophageal reflux disease) 10/17/2016  . CKD stage 3 due to type 2 diabetes mellitus ([place]) 10/04/2016  . Numbness and tingling of right arm 09/14/2016  . Idiopathic pulmonary fibrosis ([place]) 02/25/2016  . Thoracic aortic aneurysm without rupture ([place]) 02/01/2016  . Chronic cough 01/25/2016  . Midline thoracic back pain 10/19/2015  . Advanced directives, counseling/discussion 10/14/2015  . History of atrial flutter 12/21/2014  . Ventral hernia without obstruction or gangrene 10/02/2014  . Chronic systolic CHF (congestive heart failure) ([place]) 12/23/2013  . Intermediate coronary syndrome ([place]) 11/04/2013  . 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  . DDD  (degenerative disc disease), cervical 03/31/2013  . Fuchs' corneal dystrophy 02/19/2013  . Neuropathy due to secondary diabetes mellitus ([place]) 12/31/2012  . 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 DM (diabetes mellitus) with peripheral vascular complication ([place]) 18/59/0931  . Shoulder pain, bilateral 11/06/2011  . Vitamin B12 deficiency 03/11/2009  . INTERMITTENT VERTIGO 03/11/2009  . Mild dementia ([place]) 09/01/2008  . HYPERCHOLESTEROLEMIA 11/29/2007  . ALLERGIC RHINITIS 11/29/2007  . HYPERTENSION, BENIGN ESSENTIAL 11/14/2007  . OSTEOARTHRITIS 06/13/2007  . Peripheral vascular disease due to secondary diabetes mellitus ([place]) 06/12/2007  . DIVERTICULOSIS, COLON 06/12/2007  . BENIGN PROSTATIC HYPERTROPHY 06/12/2007    Past Surgical History:  Procedure Laterality Date  . albation  11/04/13  . ANKLE FRACTURE SURGERY Right   . APPENDECTOMY  1997   "days before 1st colon OR"  . ATRIAL FLUTTER ABLATION N/A 11/26/2013   Procedure: ATRIAL FLUTTER ABLATION;  Surgeon: Evans Lance, MD;  [facility] C[facility] LAB;  Service: Cardiovascular;  Laterality: N/A;  . CATARACT EXTRACTION, BILATERAL Bilateral   . CHOLECYSTECTOMY  12/03  . COLON SURGERY     "diverticulitis"  . COLONOSCOPY WITH ESOPHAGOGASTRODUODENOSCOPY (EGD)  12/00   gastic polyp   . COLONOSCOPY WITH ESOPHAGOGASTRODUODENOSCOPY (EGD)  11/07,12/2011   gastritis, 2 gastic polyps  . COLOSTOMY  1997  . COLOSTOMY REVERSAL     "think I wore it 4 months" (11/04/2013)  . CORNEAL TRANSPLANT Right   . CORONARY ANGIOGRAM  11/05/2013   Procedure: CORONARY ANGIOGRAM;  Surgeon: Wellington Hampshire, MD;  Location: [facility] CATH LAB;  Service:  Cardiovascular;;  . HERNIA REPAIR  11/05/14   ventral hernia  . INGUINAL HERNIA REPAIR Left   . PARTIAL COLECTOMY  1997   "diverticulitis; busted; Byrnett"  . stress cardiolite negative  9/02   EF 60%  . TONSILLECTOMY AND ADENOIDECTOMY     child  . VENTRAL HERNIA REPAIR  11/07   Byrnett    Prior to Admission  medications   Medication Sig Start Date End Date Taking? Authorizing Provider  allopurinol (ZYLOPRIM) 100 MG tablet TAKE 1 TABLET(100 MG) BY MOUTH DAILY 09/25/18   Bedsole, Amy E, MD  atorvastatin (LIPITOR) 20 MG tablet TAKE 1 TABLET(20 MG) BY MOUTH DAILY 10/21/18   Bedsole, Amy E, MD  Cyanocobalamin (B-12 PO) Take 1 tablet by mouth daily.    [provider]  hydrochlorothiazide (HYDRODIURIL) 25 MG tablet TAKE 1 TABLET(25 MG) BY MOUTH DAILY 09/25/18   Bedsole, Amy E, MD  pregabalin (LYRICA) 50 MG capsule Take 1 capsule (50 mg total) by mouth daily. 12/12/18   Bedsole, Amy E, MD  saline (AYR) GEL Place 1 application into the nose every 6 (six) hours. 12/13/18   Sable Feil, PA-C    Allergies Codeine  Family History  Problem Relation Age of Onset  . Coronary artery disease Sister   . Heart failure Mother     Social History Social History   Tobacco Use  . Smoking status: Former Smoker    Packs/day: 3.00    Years: 30.00    Pack years: 90.00    Types: Cigarettes  . Smokeless tobacco: Former Systems developer    Types: Chew  . Tobacco comment: 11/04/2013 "quit smoking in the 1980's or so; quit chewing before I quit smoking"  Substance Use Topics  . Alcohol use: No    Comment: 11/04/2013 "quit driking in 1980's; was called a weekend alcoholic"  . Drug use: No    Review of Systems  Constitutional: No fever/chills Eyes: No visual changes. ENT: No sore throat. Cardiovascular: Denies chest pain. Respiratory: Denies shortness of breath. Gastrointestinal: No abdominal pain.  No nausea, no vomiting.  No diarrhea.  No constipation. Genitourinary: Negative for dysuria. Musculoskeletal: Negative for back pain. Skin: Negative for rash. Neurological: Negative for headaches, focal weakness or numbness. Psychiatric:  Anxiety. Endocrine:  Borderline diabetes, hyperlipidemia, and hypertension. Allergic/Immunilogical: Codeine. ____________________________________________   PHYSICAL  EXAM:  VITAL SIGNS: ED Triage Vitals  Enc Vitals Group     BP 12/13/18 1216 (!) 152/86     Pulse Rate 12/13/18 1216 92     Resp 12/13/18 1216 17     Temp 12/13/18 1216 (!) 97.5 F (36.4 C)     Temp Source 12/13/18 1216 Oral     SpO2 12/13/18 1216 95 %     Weight 12/13/18 1217 189 lb (85.7 kg)     Height 12/13/18 1217 _0  (1.626 m)     Head Circumference --      Peak Flow --      Pain Score 12/13/18 1217 0     Pain Loc --      Pain Edu? --      Excl. in [place]? --     Constitutional: Alert and oriented. Well appearing and in no acute distress. Eyes: Conjunctivae are normal. PERRL. EOMI. Head: Atraumatic. Nose: Resolved epistaxis. Mouth/Throat: Mucous membranes are moist.  Oropharynx non-erythematous. Neck: No stridor.  Cardiovascular: Normal rate, regular rhythm. Grossly normal heart sounds.  Good peripheral circulation. Respiratory: Normal respiratory effort.  No retractions. Lungs CTAB. Neurologic:  Normal speech and language. No gross focal neurologic deficits are appreciated. No gait instability. Skin:  Skin is warm, dry and intact. No rash noted. Psychiatric: Mood and affect are normal. Speech and behavior are normal.  ____________________________________________   LABS (all labs ordered are listed, but only abnormal results are displayed)  Labs Reviewed - No data to display ____________________________________________  EKG   ____________________________________________  RADIOLOGY  ED MD interpretation:    Official radiology report(s): Ct Head Wo Contrast  Result Date: 12/13/2018 CLINICAL DATA:  Headache and epistaxis EXAM: CT HEAD WITHOUT CONTRAST TECHNIQUE: Contiguous axial images were obtained from the base of the skull through the vertex without intravenous contrast. COMPARISON:  June 07, 2018 FINDINGS: Brain: There is mild diffuse atrophy. There is no intracranial mass, hemorrhage, extra-axial fluid collection, or midline shift. There is patchy small  vessel disease in the centra semiovale bilaterally. Brain parenchyma elsewhere appears unremarkable. No acute infarct evident. Vascular: No hyperdense vessel. There is calcification in each carotid siphon region. Skull: The bony calvarium appears intact. Sinuses/Orbits: There is mucosal thickening in several ethmoid air cells. Other visualized paranasal sinuses are clear. Visualized orbits appear symmetric bilaterally. Other: Mastoid air cells are clear. IMPRESSION: Mild atrophy with patchy periventricular small vessel disease. No acute infarct evident. No mass or hemorrhage. There are foci of arterial vascular calcification. There is mucosal thickening in several ethmoid air cells. Electronically Signed   By: Lowella Grip III M.D.   On: 12/13/2018 13:30    ____________________________________________   PROCEDURES  Procedure(s) performed: None  Procedures  Critical Care performed: No  ____________________________________________   INITIAL IMPRESSION / ASSESSMENT AND PLAN / ED COURSE  As part of my medical decision making, I reviewed the following data within the electronic MEDICAL RECORD NUMBER    Right nare epistaxis.  Due to the patient complain of severe headache CT scan was ordered to rule out intracranial injury.  Patient reassured no acute findings on CT scan.  Patient given discharge care instructions and advised to use a nasal clamp if recurrence of epistaxis.      ____________________________________________   FINAL CLINICAL IMPRESSION(S) / ED DIAGNOSES  Final diagnoses:  Right-sided epistaxis     ED Discharge Orders         Ordered    saline (AYR) GEL  Every 6 hours     12/13/18 1339           Note:  This document was prepared using Dragon voice recognition software and may include unintentional dictation errors.    Sable Feil, PA-C 12/13/18 1342    Schaevitz, Randall An, MD 12/13/18 [phone]

## 2018-12-27 ENCOUNTER — Ambulatory Visit (INDEPENDENT_AMBULATORY_CARE_PROVIDER_SITE_OTHER): Payer: PPO | Admitting: Family Medicine

## 2018-12-27 ENCOUNTER — Encounter: Payer: Self-pay | Admitting: Family Medicine

## 2018-12-27 VITALS — BP 112/70 | HR 60 | Temp 97.6°F | Ht 64.0 in | Wt 190.5 lb

## 2018-12-27 DIAGNOSIS — E1340 Other specified diabetes mellitus with diabetic neuropathy, unspecified: Secondary | ICD-10-CM

## 2018-12-27 DIAGNOSIS — E1351 Other specified diabetes mellitus with diabetic peripheral angiopathy without gangrene: Secondary | ICD-10-CM | POA: Diagnosis not present

## 2018-12-27 NOTE — Progress Notes (Signed)
   Subjective:    Patient ID: Caleb Trulock Sr., male    DOB: [birth year], 85 y.o.   MRN: [id number]  HPI  85 year old male presents for follow up peripheral neuropathy.  At last OV increased lyrica to 50 mg daily  He reports he has noted decrease in pain in feet.. now tolerable. 20% improvement.  No SE.. no dizziness, no sedation, not unsteady.  Able to sleep at night.  PAD assessment:  He never went for ABIs as requested. He refuses eval given concern of cost.  No definite claudication symptoms.   He feels shoes are not comfortable.    He went to ER for nose bleed. He  Did not go given  Review of Systems  Constitutional: Negative for fatigue and fever.  HENT: Negative for ear pain.   Eyes: Negative for pain.  Respiratory: Negative for cough and shortness of breath.   Cardiovascular: Negative for chest pain, palpitations and leg swelling.  Gastrointestinal: Negative for abdominal pain.  Genitourinary: Negative for dysuria.  Musculoskeletal: Negative for arthralgias.  Neurological: Negative for syncope, light-headedness and headaches.  Psychiatric/Behavioral: Negative for dysphoric mood.       Objective:   Physical Exam Constitutional:      Appearance: He is well-developed. He is obese.  HENT:     Head: Normocephalic.     Right Ear: Hearing normal.     Left Ear: Hearing normal.     Nose: Nose normal.  Neck:     Thyroid: No thyroid mass or thyromegaly.     Vascular: No carotid bruit.     Trachea: Trachea normal.  Cardiovascular:     Rate and Rhythm: Normal rate and regular rhythm.     Pulses:          Dorsalis pedis pulses are 1+ on the right side and 1+ on the left side.       Posterior tibial pulses are 1+ on the right side and 1+ on the left side.     Heart sounds: Heart sounds not distant. No murmur. No friction rub. No gallop.      Comments: No peripheral edema Pulmonary:     Effort: Pulmonary effort is normal. No respiratory distress.     Breath  sounds: Normal breath sounds. Examination of the right-lower field reveals rales. Examination of the left-lower field reveals rales.  Musculoskeletal:     Right lower leg: No edema.     Left lower leg: No edema.     Right foot: Deformity present.     Left foot: Deformity present.  Feet:     Right foot:     Protective Sensation: 2 sites tested. 0 sites sensed.     Skin integrity: Callus present.     Left foot:     Protective Sensation: 2 sites tested. 0 sites sensed.     Skin integrity: Callus present.  Skin:    General: Skin is warm and dry.     Findings: No rash.  Psychiatric:        Speech: Speech normal.        Behavior: Behavior normal.        Thought Content: Thought content normal.           Assessment & Plan:

## 2018-12-27 NOTE — Assessment & Plan Note (Signed)
Hard to determine if any component of symptoms from PAD worsening.  Pt refuses return  For ABIs for now.. will call if worsening foot pain.

## 2018-12-27 NOTE — Patient Instructions (Signed)
Call if you would be interested in increase of lyrica or if change you mind about blood flow test to legs.  Get diabetic shoes.

## 2018-12-27 NOTE — Assessment & Plan Note (Signed)
Improved with lyrica.Marland Kitchen low dose.  Prescription given for diabetic shoes. Offered referral to podiatry.. he will consider.

## 2019-01-10 DIAGNOSIS — L570 Actinic keratosis: Secondary | ICD-10-CM | POA: Diagnosis not present

## 2019-01-10 DIAGNOSIS — L820 Inflamed seborrheic keratosis: Secondary | ICD-10-CM | POA: Diagnosis not present

## 2019-02-19 ENCOUNTER — Telehealth: Payer: Self-pay | Admitting: Family Medicine

## 2019-02-19 NOTE — Telephone Encounter (Signed)
Left message asking pt to call office please ask if pt can do virtual appointment with dr Diona Browner

## 2019-02-19 NOTE — Telephone Encounter (Signed)
Patient is returning a call regarding scheduling an appointment.  Please call patient back at [phone]

## 2019-02-25 ENCOUNTER — Ambulatory Visit: Payer: PPO | Admitting: Family Medicine

## 2019-04-14 ENCOUNTER — Other Ambulatory Visit: Payer: Self-pay | Admitting: Family Medicine

## 2019-08-07 ENCOUNTER — Other Ambulatory Visit: Payer: Self-pay | Admitting: General Surgery

## 2019-08-07 DIAGNOSIS — R1013 Epigastric pain: Secondary | ICD-10-CM

## 2019-08-07 NOTE — Progress Notes (Unsigned)
cbc

## 2019-08-08 ENCOUNTER — Other Ambulatory Visit: Payer: Self-pay | Admitting: General Surgery

## 2019-08-11 ENCOUNTER — Other Ambulatory Visit: Payer: Self-pay

## 2019-08-11 ENCOUNTER — Other Ambulatory Visit
Admission: RE | Admit: 2019-08-11 | Discharge: 2019-08-11 | Disposition: A | Discharge status: Home / Self Care | Payer: PPO | Source: Home / Self Care | Attending: *Deleted | Admitting: *Deleted

## 2019-08-11 ENCOUNTER — Other Ambulatory Visit
Admission: RE | Admit: 2019-08-11 | Discharge: 2019-08-11 | Disposition: A | Discharge status: Home / Self Care | Payer: PPO | Source: Ambulatory Visit | Attending: General Surgery | Admitting: General Surgery

## 2019-08-11 DIAGNOSIS — Z20828 Contact with and (suspected) exposure to other viral communicable diseases: Secondary | ICD-10-CM | POA: Diagnosis not present

## 2019-08-11 DIAGNOSIS — Z01812 Encounter for preprocedural laboratory examination: Secondary | ICD-10-CM | POA: Diagnosis not present

## 2019-08-11 DIAGNOSIS — R1013 Epigastric pain: Secondary | ICD-10-CM | POA: Diagnosis not present

## 2019-08-11 LAB — SARS CORONAVIRUS 2 (TAT 6-24 HRS): SARS Coronavirus 2: NEGATIVE

## 2019-08-11 LAB — CBC WITH DIFFERENTIAL/PLATELET
Abs Immature Granulocytes: 0.05 10*3/uL (ref 0.00–0.07)
Basophils Absolute: 0.1 10*3/uL (ref 0.0–0.1)
Basophils Relative: 1 %
Eosinophils Absolute: 0.5 10*3/uL (ref 0.0–0.5)
Eosinophils Relative: 5 %
HCT: 47 % (ref 39.0–52.0)
Hemoglobin: 15.4 g/dL (ref 13.0–17.0)
Immature Granulocytes: 1 %
Lymphocytes Relative: 24 %
Lymphs Abs: 2.6 10*3/uL (ref 0.7–4.0)
MCH: 30.8 pg (ref 26.0–34.0)
MCHC: 32.8 g/dL (ref 30.0–36.0)
MCV: 94 fL (ref 80.0–100.0)
Monocytes Absolute: 0.8 10*3/uL (ref 0.1–1.0)
Monocytes Relative: 8 %
Neutro Abs: 6.5 10*3/uL (ref 1.7–7.7)
Neutrophils Relative %: 61 %
Platelets: 240 10*3/uL (ref 150–400)
RBC: 5 MIL/uL (ref 4.22–5.81)
RDW: 12.9 % (ref 11.5–15.5)
WBC: 10.5 10*3/uL (ref 4.0–10.5)
nRBC: 0 % (ref 0.0–0.2)

## 2019-08-13 ENCOUNTER — Encounter
Admission: RE | Disposition: A | Discharge status: Home / Self Care | Payer: Self-pay | Source: Home / Self Care | Attending: Physician Assistant

## 2019-08-13 ENCOUNTER — Other Ambulatory Visit: Payer: Self-pay

## 2019-08-13 ENCOUNTER — Ambulatory Visit: Payer: PPO | Admitting: Certified Registered Nurse Anesthetist

## 2019-08-13 ENCOUNTER — Encounter: Payer: Self-pay | Admitting: *Deleted

## 2019-08-13 ENCOUNTER — Ambulatory Visit
Admission: RE | Admit: 2019-08-13 | Discharge: 2019-08-13 | Disposition: A | Discharge status: Home / Self Care | Payer: PPO | Attending: Physician Assistant | Admitting: Physician Assistant

## 2019-08-13 DIAGNOSIS — I739 Peripheral vascular disease, unspecified: Secondary | ICD-10-CM | POA: Diagnosis not present

## 2019-08-13 DIAGNOSIS — J449 Chronic obstructive pulmonary disease, unspecified: Secondary | ICD-10-CM | POA: Insufficient documentation

## 2019-08-13 DIAGNOSIS — Z947 Corneal transplant status: Secondary | ICD-10-CM | POA: Diagnosis not present

## 2019-08-13 DIAGNOSIS — Z87891 Personal history of nicotine dependence: Secondary | ICD-10-CM | POA: Diagnosis not present

## 2019-08-13 DIAGNOSIS — R109 Unspecified abdominal pain: Secondary | ICD-10-CM | POA: Diagnosis not present

## 2019-08-13 DIAGNOSIS — K317 Polyp of stomach and duodenum: Secondary | ICD-10-CM | POA: Diagnosis not present

## 2019-08-13 DIAGNOSIS — R1013 Epigastric pain: Secondary | ICD-10-CM | POA: Diagnosis not present

## 2019-08-13 DIAGNOSIS — M109 Gout, unspecified: Secondary | ICD-10-CM | POA: Insufficient documentation

## 2019-08-13 DIAGNOSIS — E785 Hyperlipidemia, unspecified: Secondary | ICD-10-CM | POA: Diagnosis not present

## 2019-08-13 DIAGNOSIS — R634 Abnormal weight loss: Secondary | ICD-10-CM | POA: Diagnosis not present

## 2019-08-13 DIAGNOSIS — K5730 Diverticulosis of large intestine without perforation or abscess without bleeding: Secondary | ICD-10-CM | POA: Diagnosis not present

## 2019-08-13 DIAGNOSIS — Z7689 Persons encountering health services in other specified circumstances: Secondary | ICD-10-CM | POA: Diagnosis not present

## 2019-08-13 DIAGNOSIS — Z9049 Acquired absence of other specified parts of digestive tract: Secondary | ICD-10-CM | POA: Insufficient documentation

## 2019-08-13 DIAGNOSIS — I10 Essential (primary) hypertension: Secondary | ICD-10-CM | POA: Diagnosis not present

## 2019-08-13 DIAGNOSIS — Z79899 Other long term (current) drug therapy: Secondary | ICD-10-CM | POA: Insufficient documentation

## 2019-08-13 HISTORY — DX: Chronic obstructive pulmonary disease, unspecified: J44.9

## 2019-08-13 HISTORY — DX: Cardiac arrhythmia, unspecified: I49.9

## 2019-08-13 HISTORY — PX: ESOPHAGOGASTRODUODENOSCOPY (EGD) WITH PROPOFOL: SHX5813

## 2019-08-13 SURGERY — ESOPHAGOGASTRODUODENOSCOPY (EGD) WITH PROPOFOL
Anesthesia: General

## 2019-08-13 MED ORDER — PROPOFOL 500 MG/50ML IV EMUL
INTRAVENOUS | Status: AC
  Filled 2019-08-13: qty 50

## 2019-08-13 MED ORDER — PROPOFOL 500 MG/50ML IV EMUL
INTRAVENOUS | Status: DC | PRN
  Administered 2019-08-13: 125 ug/kg/min via INTRAVENOUS

## 2019-08-13 MED ORDER — SODIUM CHLORIDE 0.9 % IV SOLN
INTRAVENOUS | Status: DC
  Administered 2019-08-13: 1000 mL via INTRAVENOUS

## 2019-08-13 MED ORDER — PROPOFOL 10 MG/ML IV BOLUS
INTRAVENOUS | Status: AC
  Filled 2019-08-13: qty 20

## 2019-08-13 MED ORDER — PROPOFOL 10 MG/ML IV BOLUS
INTRAVENOUS | Status: DC | PRN
  Administered 2019-08-13: 40 mg via INTRAVENOUS

## 2019-08-13 MED ORDER — LIDOCAINE HCL (CARDIAC) PF 100 MG/5ML IV SOSY
PREFILLED_SYRINGE | INTRAVENOUS | Status: DC | PRN
  Administered 2019-08-13: 50 mg via INTRAVENOUS

## 2019-08-13 NOTE — Anesthesia Post-op Follow-up Note (Signed)
Anesthesia QCDR form completed.

## 2019-08-13 NOTE — Op Note (Signed)
[facility] Patient Name: Caleb Bryant Procedure Date: 08/13/2019 7:29 AM MRN: [id number] Account #: [id number] Date of Birth: 18-Aug-1934 Admit Type: Outpatient Age: 85 Room: [facility] ENDO ROOM 1 Gender: Male Note Status: Finalized Procedure:            Upper GI endoscopy Indications:          Epigastric abdominal pain Providers:            Robert Bellow, MD Medicines:            Monitored Anesthesia Care Complications:        No immediate complications. Procedure:            Pre-Anesthesia Assessment:                       - Prior to the procedure, a History and Physical was                        performed, and patient medications, allergies and                        sensitivities were reviewed. The patient's tolerance of                        previous anesthesia was reviewed.                       - The risks and benefits of the procedure and the                        sedation options and risks were discussed with the                        patient. All questions were answered and informed                        consent was obtained.                       After obtaining informed consent, the endoscope was                        passed under direct vision. Throughout the procedure,                        the patient's blood pressure, pulse, and oxygen                        saturations were monitored continuously. The Endoscope                        was introduced through the mouth, and advanced to the                        third part of duodenum. The upper GI endoscopy was                        accomplished without difficulty. The patient tolerated                        the procedure well.  The patient had three days of black, tarry stools two                        weeks ago, now resolved. He has experienced early                        satiety and upper abdominal discomfort. Findings:      The esophagus was  normal.      The examined duodenum was normal.      A single 5 mm sessile polyp with no stigmata of recent bleeding was       found in the gastric body. Biopsies were taken with a cold forceps for       histology. Impression:           - Normal esophagus.                       - Normal examined duodenum.                       - A single gastric polyp. Biopsied. Recommendation:       - Telephone endoscopist for pathology results in 1 week. Procedure Code(s):    --- Professional ---                       [phone], Esophagogastroduodenoscopy, flexible, transoral;                        with biopsy, single or multiple Diagnosis Code(s):    --- Professional ---                       K31.7, Polyp of stomach and duodenum                       R10.13, Epigastric pain CPT copyright 2019 American Medical Association. All rights reserved. The codes documented in this report are preliminary and upon coder review may  be revised to meet current compliance requirements. Robert Bellow, MD 08/13/2019 8:10:49 AM This report has been signed electronically. Number of Addenda: 0 Note Initiated On: 08/13/2019 7:29 AM Estimated Blood Loss: Estimated blood loss: none.      [facility]

## 2019-08-13 NOTE — Anesthesia Preprocedure Evaluation (Signed)
Anesthesia Evaluation  Patient identified by MRN, date of birth, ID band Patient awake    Reviewed: Allergy & Precautions, NPO status , Patient's Chart, lab work & pertinent test results  History of Anesthesia Complications Negative for: history of anesthetic complications  Airway Mallampati: III       Dental  (+) Missing, Chipped   Pulmonary neg sleep apnea, neg COPD, Not current smoker, former smoker,           Cardiovascular hypertension, Pt. on medications (-) Past MI and (-) CHF + dysrhythmias (s/p ablation) (-) pacemaker(-) Valvular Problems/Murmurs     Neuro/Psych neg Seizures Anxiety    GI/Hepatic Neg liver ROS, GERD  ,  Endo/Other  diabetes (borderline, diet controlled), Type 2  Renal/GU Renal InsufficiencyRenal disease     Musculoskeletal   Abdominal   Peds  Hematology   Anesthesia Other Findings   Reproductive/Obstetrics                             Anesthesia Physical Anesthesia Plan  ASA: III  Anesthesia Plan: General   Post-op Pain Management:    Induction: Intravenous  PONV Risk Score and Plan: 2 and TIVA and Propofol infusion  Airway Management Planned: Nasal Cannula  Additional Equipment:   Intra-op Plan:   Post-operative Plan:   Informed Consent: I have reviewed the patients History and Physical, chart, labs and discussed the procedure including the risks, benefits and alternatives for the proposed anesthesia with the patient or authorized representative who has indicated his/her understanding and acceptance.       Plan Discussed with:   Anesthesia Plan Comments:         Anesthesia Quick Evaluation

## 2019-08-13 NOTE — Anesthesia Postprocedure Evaluation (Signed)
Anesthesia Post Note  Patient: Caleb Bearden Gebert Sr.  Procedure(s) Performed: ESOPHAGOGASTRODUODENOSCOPY (EGD) WITH PROPOFOL (N/A )  Patient location during evaluation: Endoscopy Anesthesia Type: General Level of consciousness: awake and alert Pain management: pain level controlled Vital Signs Assessment: post-procedure vital signs reviewed and stable Respiratory status: spontaneous breathing and respiratory function stable Cardiovascular status: stable Anesthetic complications: no     Last Vitals:  Vitals:   08/13/19 0820 08/13/19 0836  BP: 118/77   Pulse: 75 61  Resp: (!) 31 (!) 24  Temp:    SpO2: 94% 96%    Last Pain:  Vitals:   08/13/19 0810  TempSrc: Tympanic  PainSc:                  Leatrice Parilla K

## 2019-08-13 NOTE — Transfer of Care (Signed)
Immediate Anesthesia Transfer of Care Note  Patient: Caleb Bradly Filippi Sr.  Procedure(s) Performed: ESOPHAGOGASTRODUODENOSCOPY (EGD) WITH PROPOFOL (N/A )  Patient Location: PACU  Anesthesia Type:General  Level of Consciousness: awake, alert  and oriented  Airway & Oxygen Therapy: Patient Spontanous Breathing and Patient connected to face mask oxygen  Post-op Assessment: Report given to RN and Post -op Vital signs reviewed and stable  Post vital signs: Reviewed and stable  Last Vitals:  Vitals Value Taken Time  BP 90/62 08/13/19 0811  Temp 36.1 C 08/13/19 0810  Pulse 76 08/13/19 0811  Resp 31 08/13/19 0811  SpO2 99 % 08/13/19 0811  Vitals shown include unvalidated device data.  Last Pain:  Vitals:   08/13/19 0810  TempSrc: Tympanic  PainSc:          Complications: No apparent anesthesia complications

## 2019-08-13 NOTE — H&P (Signed)
Caleb Ellerby Treichler Sr. [id number] 03-09-1934     HPI:  Abdominal pain, weight loss and report of black stools x 3 days. HGB stable. For EGD.  Medications Prior to Admission  Medication Sig Dispense Refill Last Dose  . allopurinol (ZYLOPRIM) 100 MG tablet TAKE 1 TABLET(100 MG) BY MOUTH DAILY 90 tablet 2   . atorvastatin (LIPITOR) 20 MG tablet TAKE 1 TABLET(20 MG) BY MOUTH DAILY 90 tablet 2   . Cyanocobalamin (B-12 PO) Take 1 tablet by mouth daily.     . hydrochlorothiazide (HYDRODIURIL) 25 MG tablet TAKE 1 TABLET(25 MG) BY MOUTH DAILY 90 tablet 2   . pregabalin (LYRICA) 50 MG capsule Take 1 capsule (50 mg total) by mouth daily. (Patient not taking: Reported on 12/27/2018) 30 capsule 3   . saline (AYR) GEL Place 1 application into the nose every 6 (six) hours. 14.1 g 3    Allergies  Allergen Reactions  . Codeine Nausea And Vomiting   Past Medical History:  Diagnosis Date  . Anxiety   . Atrial flutter ([place])    "just dx'd today" (11/04/2013)  . Benign prostatic hypertrophy   . Borderline diabetes   . Colon, diverticulosis   . COPD (chronic obstructive pulmonary disease) ([place])   . Dysrhythmia   . Exertional shortness of breath   . Gout   . Hyperlipidemia   . Hypertension   . Osteoarthritis    "all over" (11/04/2013)  . PVD (peripheral vascular disease) ([place])    Past Surgical History:  Procedure Laterality Date  . albation  11/04/13  . ANKLE FRACTURE SURGERY Right   . APPENDECTOMY  1997   "days before 1st colon OR"  . ATRIAL FLUTTER ABLATION N/A 11/26/2013   Procedure: ATRIAL FLUTTER ABLATION;  Surgeon: Evans Lance, MD;  [facility] CATH LAB;  Service: Cardiovascular;  Laterality: N/A;  . CATARACT EXTRACTION, BILATERAL Bilateral   . CHOLECYSTECTOMY  12/03  . COLON SURGERY     "diverticulitis"  . COLONOSCOPY WITH ESOPHAGOGASTRODUODENOSCOPY (EGD)  12/00   gastic polyp   . COLONOSCOPY WITH ESOPHAGOGASTRODUODENOSCOPY (EGD)  11/07,12/2011   gastritis, 2 gastic polyps  .  COLOSTOMY  1997  . COLOSTOMY REVERSAL     "think I wore it 4 months" (11/04/2013)  . CORNEAL TRANSPLANT Right   . CORONARY ANGIOGRAM  11/05/2013   Procedure: CORONARY ANGIOGRAM;  Surgeon: Wellington Hampshire, MD;  Location: [place] CATH LAB;  Service: Cardiovascular;;  . HERNIA REPAIR  11/05/14   ventral hernia  . INGUINAL HERNIA REPAIR Left   . PARTIAL COLECTOMY  1997   "diverticulitis; busted; Markus Casten"  . stress cardiolite negative  9/02   EF 60%  . TONSILLECTOMY AND ADENOIDECTOMY     child  . VENTRAL HERNIA REPAIR  11/07   Leoni Goodness   Social History   Socioeconomic History  . Marital status: Married    Spouse name: Not on file  . Number of children: 3  . Years of education: Not on file  . Highest education level: Not on file  Occupational History    Employer: RETIRED  Social Needs  . Financial resource strain: Not on file  . Food insecurity    Worry: Not on file    Inability: Not on file  . Transportation needs    Medical: Not on file    Non-medical: Not on file  Tobacco Use  . Smoking status: Former Smoker    Packs/day: 3.00    Years: 30.00    Pack years: 90.00  Types: Cigarettes  . Smokeless tobacco: Former Systems developer    Types: Chew  . Tobacco comment: 11/04/2013 "quit smoking in the 1980's or so; quit chewing before I quit smoking"  Substance and Sexual Activity  . Alcohol use: No    Comment: 11/04/2013 "quit driking in 1980's; was called a weekend alcoholic"  . Drug use: No  . Sexual activity: Never  Lifestyle  . Physical activity    Days per week: Not on file    Minutes per session: Not on file  . Stress: Not on file  Relationships  . Social Herbalist on phone: Not on file    Gets together: Not on file    Attends religious service: Not on file    Active member of club or organization: Not on file    Attends meetings of clubs or organizations: Not on file    Relationship status: Not on file  . Intimate partner violence    Fear of current or ex partner: Not on  file    Emotionally abused: Not on file    Physically abused: Not on file    Forced sexual activity: Not on file  Other Topics Concern  . Not on file  Social History Narrative   Still does lawn work.    No living will, Wife is Economist. (Reviewed 75)   Social History   Social History Narrative   Still does lawn work.    No living will, Wife is HCPOA. (Reviewed 2015)     ROS: Negative.     PE: HEENT: Negative. Lungs: Clear. Cardio: RR..  CBC of 08/11/19 was normal.   Assessment/Plan:  Proceed with planned endoscopy. Forest Gleason Kayan Blissett 08/13/2019

## 2019-08-14 ENCOUNTER — Encounter: Payer: Self-pay | Admitting: General Surgery

## 2019-08-14 LAB — SURGICAL PATHOLOGY

## 2019-09-29 ENCOUNTER — Ambulatory Visit
Admission: EM | Admit: 2019-09-29 | Discharge: 2019-09-29 | Disposition: A | Discharge status: Home / Self Care | Payer: PPO | Attending: Emergency Medicine | Admitting: Emergency Medicine

## 2019-09-29 ENCOUNTER — Encounter: Payer: Self-pay | Admitting: *Deleted

## 2019-09-29 DIAGNOSIS — Z20828 Contact with and (suspected) exposure to other viral communicable diseases: Secondary | ICD-10-CM

## 2019-09-29 NOTE — ED Provider Notes (Signed)
Caleb Bryant    CSN: [id number] Arrival date & time: 09/29/19  [place]      History   Chief Complaint Chief Complaint  Patient presents with  . COVID Testing    HPI Caleb Spinello Laursen Sr. is a 85 y.o. male.   Patient presents with request for a COVID test.  He reports exposure to a positive family member last week.  He denies current symptoms.  He has COPD but reports no unusual shortness of breath or cough.  He denies fever, chills, congestion, vomiting, diarrhea.  No treatments attempted at home.  The history is provided by the patient.    Past Medical History:  Diagnosis Date  . Anxiety   . Atrial flutter ([place])    "just dx'd today" (11/04/2013)  . Benign prostatic hypertrophy   . Borderline diabetes   . Colon, diverticulosis   . COPD (chronic obstructive pulmonary disease) ([place])   . Dysrhythmia   . Exertional shortness of breath   . Gout   . Hyperlipidemia   . Hypertension   . Osteoarthritis    "all over" (11/04/2013)  . PVD (peripheral vascular disease) [facility])     Patient Active Problem List   Diagnosis Date Noted  . GERD (gastroesophageal reflux disease) 10/17/2016  . CKD stage 3 due to type 2 diabetes mellitus ([place]) 10/04/2016  . Numbness and tingling of right arm 09/14/2016  . Idiopathic pulmonary fibrosis ([place]) 02/25/2016  . Thoracic aortic aneurysm without rupture ([place]) 02/01/2016  . Chronic cough 01/25/2016  . Midline thoracic back pain 10/19/2015  . Advanced directives, counseling/discussion 10/14/2015  . History of atrial flutter 12/21/2014  . Ventral hernia without obstruction or gangrene 10/02/2014  . Chronic systolic CHF (congestive heart failure) ([place]) 12/23/2013  . Intermediate coronary syndrome ([place]) 11/04/2013  . 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  . DDD (degenerative disc disease), cervical 03/31/2013  . Fuchs' corneal dystrophy 02/19/2013  . Neuropathy due to secondary diabetes mellitus ([place]) 12/31/2012  . Gout, chronic, with tophus  09/19/2012  . DM (diabetes mellitus) with peripheral vascular complication ([place]) 09/32/3557  . Shoulder pain, bilateral 11/06/2011  . Vitamin B12 deficiency 03/11/2009  . INTERMITTENT VERTIGO 03/11/2009  . Mild dementia ([place]) 09/01/2008  . HYPERCHOLESTEROLEMIA 11/29/2007  . ALLERGIC RHINITIS 11/29/2007  . HYPERTENSION, BENIGN ESSENTIAL 11/14/2007  . OSTEOARTHRITIS 06/13/2007  . Peripheral vascular disease due to secondary diabetes mellitus ([place]) 06/12/2007  . DIVERTICULOSIS, COLON 06/12/2007  . BENIGN PROSTATIC HYPERTROPHY 06/12/2007    Past Surgical History:  Procedure Laterality Date  . albation  11/04/13  . ANKLE FRACTURE SURGERY Right   . APPENDECTOMY  1997   "days before 1st colon OR"  . ATRIAL FLUTTER ABLATION N/A 11/26/2013   Procedure: ATRIAL FLUTTER ABLATION;  Surgeon: Evans Lance, MD;  Location: [facility] CATH LAB;  Service: Cardiovascular;  Laterality: N/A;  . CATARACT EXTRACTION, BILATERAL Bilateral   . CHOLECYSTECTOMY  12/03  . COLON SURGERY     "diverticulitis"  . COLONOSCOPY WITH ESOPHAGOGASTRODUODENOSCOPY (EGD)  12/00   gastic polyp   . COLONOSCOPY WITH ESOPHAGOGASTRODUODENOSCOPY (EGD)  11/07,12/2011   gastritis, 2 gastic polyps  . COLOSTOMY  1997  . COLOSTOMY REVERSAL     "think I wore it 4 months" (11/04/2013)  . CORNEAL TRANSPLANT Right   . CORONARY ANGIOGRAM  11/05/2013   Procedure: CORONARY ANGIOGRAM;  Surgeon: Wellington Hampshire, MD;  Location: [place] CATH LAB;  Service: Cardiovascular;;  . ESOPHAGOGASTRODUODENOSCOPY (EGD) WITH PROPOFOL N/A 08/13/2019   Procedure: ESOPHAGOGASTRODUODENOSCOPY (EGD) WITH PROPOFOL;  Surgeon: Robert Bellow, MD;  Location: [facility] ENDOSCOPY;  Service: Endoscopy;  Laterality: N/A;  . HERNIA REPAIR  11/05/14   ventral hernia  . INGUINAL HERNIA REPAIR Left   . PARTIAL COLECTOMY  1997   "diverticulitis; busted; [place]"  . stress cardiolite negative  9/02   EF 60%  . TONSILLECTOMY AND ADENOIDECTOMY     child  . VENTRAL HERNIA REPAIR  11/07    [place]       Home Medications    Prior to Admission medications   Medication Sig Start Date End Date Taking? Authorizing Provider  allopurinol (ZYLOPRIM) 100 MG tablet TAKE 1 TABLET(100 MG) BY MOUTH DAILY 04/14/19   Bedsole, Amy E, MD  atorvastatin (LIPITOR) 20 MG tablet TAKE 1 TABLET(20 MG) BY MOUTH DAILY 04/14/19   Bedsole, Amy E, MD  Cyanocobalamin (B-12 PO) Take 1 tablet by mouth daily.    [provider]  hydrochlorothiazide (HYDRODIURIL) 25 MG tablet TAKE 1 TABLET(25 MG) BY MOUTH DAILY 04/14/19   Bedsole, Amy E, MD  pregabalin (LYRICA) 50 MG capsule Take 1 capsule (50 mg total) by mouth daily. Patient not taking: Reported on 12/27/2018 12/12/18   [name], MD  saline (AYR) GEL Place 1 application into the nose every 6 (six) hours. 12/13/18   Sable Feil, PA-C    Family History Family History  Problem Relation Age of Onset  . Coronary artery disease Sister   . Heart failure Mother     Social History Social History   Tobacco Use  . Smoking status: Former Smoker    Packs/day: 3.00    Years: 30.00    Pack years: 90.00    Types: Cigarettes  . Smokeless tobacco: Former Systems developer    Types: Chew  . Tobacco comment: 11/04/2013 "quit smoking in the 1980's or so; quit chewing before I quit smoking"  Substance Use Topics  . Alcohol use: No    Comment: 11/04/2013 "quit driking in 1980's; was called a weekend alcoholic"  . Drug use: No     Allergies   Codeine   Review of Systems Review of Systems  Constitutional: Negative for chills and fever.  HENT: Negative for congestion, ear pain, rhinorrhea and sore throat.   Eyes: Negative for pain and visual disturbance.  Respiratory: Negative for cough and shortness of breath.   Cardiovascular: Negative for chest pain and palpitations.  Gastrointestinal: Negative for abdominal pain, diarrhea and vomiting.  Genitourinary: Negative for dysuria and hematuria.  Musculoskeletal: Negative for arthralgias and back pain.   Skin: Negative for color change and rash.  Neurological: Negative for seizures and syncope.  All other systems reviewed and are negative.    Physical Exam Triage Vital Signs ED Triage Vitals [09/29/19 1659]  Enc Vitals Group     BP 124/86     Pulse Rate 97     Resp 18     Temp 98.6 F (37 C)     Temp Source Oral     SpO2 95 %     Weight      Height      Head Circumference      Peak Flow      Pain Score      Pain Loc      Pain Edu?      Excl. in [place]?    No data found.  Updated Vital Signs BP 124/86   Pulse 97   Temp 98.6 F (37 C) (Oral)   Resp 18   SpO2  95%   Visual Acuity Right Eye Distance:   Left Eye Distance:   Bilateral Distance:    Right Eye Near:   Left Eye Near:    Bilateral Near:     Physical Exam Vitals signs and nursing note reviewed.  Constitutional:      Appearance: He is well-developed.  HENT:     Head: Normocephalic and atraumatic.     Right Ear: Tympanic membrane normal.     Left Ear: Tympanic membrane normal.     Nose: Nose normal.     Mouth/Throat:     Mouth: Mucous membranes are moist.     Pharynx: Oropharynx is clear.  Eyes:     Conjunctiva/sclera: Conjunctivae normal.  Neck:     Musculoskeletal: Neck supple.  Cardiovascular:     Rate and Rhythm: Normal rate and regular rhythm.     Heart sounds: No murmur.  Pulmonary:     Effort: Pulmonary effort is normal. No respiratory distress.     Breath sounds: Normal breath sounds.  Abdominal:     General: Bowel sounds are normal.     Palpations: Abdomen is soft.     Tenderness: There is no abdominal tenderness. There is no guarding or rebound.  Skin:    General: Skin is warm and dry.     Findings: No rash.  Neurological:     General: No focal deficit present.     Mental Status: He is alert and oriented to person, place, and time.      UC Treatments / Results  Labs (all labs ordered are listed, but only abnormal results are displayed) Labs Reviewed  NOVEL CORONAVIRUS, NAA     EKG   Radiology No results found.  Procedures Procedures (including critical care time)  Medications Ordered in UC Medications - No data to display  Initial Impression / Assessment and Plan / UC Course  I have reviewed the triage vital signs and the nursing notes.  Pertinent labs & imaging results that were available during my care of the patient were reviewed by me and considered in my medical decision making (see chart for details).    Patient request for COVID test due to exposure to positive family member.  COVID test performed here.  Instructed patient to self quarantine until the test result is back.  Instructed patient to go to the emergency department if he develops high fever, shortness of breath, severe diarrhea, or other concerning symptoms.  Patient agrees with plan of care.     Final Clinical Impressions(s) / UC Diagnoses   Final diagnoses:  Exposure to SARS-associated coronavirus     Discharge Instructions     Your COVID test is pending.  You should self quarantine until your test result is back and is negative.    Go to the emergency department if you develop high fever, shortness of breath, severe diarrhea, or other concerning symptoms.       ED Prescriptions    None     PDMP not reviewed this encounter.   Sharion Balloon, NP 09/29/19 1744

## 2019-09-29 NOTE — ED Notes (Signed)
Patient states he would like to be tested for COVID. No symptoms.

## 2019-09-29 NOTE — Discharge Instructions (Addendum)
Your COVID test is pending.  You should self quarantine until your test result is back and is negative.    Go to the emergency department if you develop high fever, shortness of breath, severe diarrhea, or other concerning symptoms.

## 2019-09-30 LAB — NOVEL CORONAVIRUS, NAA: SARS-CoV-2, NAA: NOT DETECTED

## 2019-11-27 DIAGNOSIS — M7541 Impingement syndrome of right shoulder: Secondary | ICD-10-CM | POA: Diagnosis not present

## 2019-11-27 DIAGNOSIS — M7540 Impingement syndrome of unspecified shoulder: Secondary | ICD-10-CM | POA: Diagnosis not present

## 2019-11-27 DIAGNOSIS — M7542 Impingement syndrome of left shoulder: Secondary | ICD-10-CM | POA: Diagnosis not present

## 2019-12-05 ENCOUNTER — Encounter: Payer: Self-pay | Admitting: Family Medicine

## 2019-12-05 ENCOUNTER — Ambulatory Visit (INDEPENDENT_AMBULATORY_CARE_PROVIDER_SITE_OTHER): Payer: PPO | Admitting: Family Medicine

## 2019-12-05 ENCOUNTER — Other Ambulatory Visit: Payer: Self-pay

## 2019-12-05 ENCOUNTER — Ambulatory Visit: Payer: PPO

## 2019-12-05 ENCOUNTER — Encounter: Payer: PPO | Admitting: Family Medicine

## 2019-12-05 VITALS — BP 118/76 | HR 80 | Temp 98.2°F | Ht 65.0 in | Wt 179.8 lb

## 2019-12-05 DIAGNOSIS — Z0000 Encounter for general adult medical examination without abnormal findings: Secondary | ICD-10-CM

## 2019-12-05 DIAGNOSIS — I10 Essential (primary) hypertension: Secondary | ICD-10-CM

## 2019-12-05 DIAGNOSIS — E1351 Other specified diabetes mellitus with diabetic peripheral angiopathy without gangrene: Secondary | ICD-10-CM | POA: Diagnosis not present

## 2019-12-05 DIAGNOSIS — E538 Deficiency of other specified B group vitamins: Secondary | ICD-10-CM | POA: Diagnosis not present

## 2019-12-05 DIAGNOSIS — M1A00X1 Idiopathic chronic gout, unspecified site, with tophus (tophi): Secondary | ICD-10-CM

## 2019-12-05 DIAGNOSIS — S91032A Puncture wound without foreign body, left ankle, initial encounter: Secondary | ICD-10-CM | POA: Diagnosis not present

## 2019-12-05 DIAGNOSIS — I5022 Chronic systolic (congestive) heart failure: Secondary | ICD-10-CM | POA: Diagnosis not present

## 2019-12-05 DIAGNOSIS — W540XXA Bitten by dog, initial encounter: Secondary | ICD-10-CM

## 2019-12-05 DIAGNOSIS — J84112 Idiopathic pulmonary fibrosis: Secondary | ICD-10-CM | POA: Diagnosis not present

## 2019-12-05 DIAGNOSIS — Z23 Encounter for immunization: Secondary | ICD-10-CM

## 2019-12-05 DIAGNOSIS — E7800 Pure hypercholesterolemia, unspecified: Secondary | ICD-10-CM

## 2019-12-05 DIAGNOSIS — F03A0 Unspecified dementia, mild, without behavioral disturbance, psychotic disturbance, mood disturbance, and anxiety: Secondary | ICD-10-CM

## 2019-12-05 DIAGNOSIS — E1151 Type 2 diabetes mellitus with diabetic peripheral angiopathy without gangrene: Secondary | ICD-10-CM

## 2019-12-05 DIAGNOSIS — E1340 Other specified diabetes mellitus with diabetic neuropathy, unspecified: Secondary | ICD-10-CM

## 2019-12-05 DIAGNOSIS — Z0001 Encounter for general adult medical examination with abnormal findings: Secondary | ICD-10-CM | POA: Diagnosis not present

## 2019-12-05 DIAGNOSIS — N183 Chronic kidney disease, stage 3 unspecified: Secondary | ICD-10-CM | POA: Diagnosis not present

## 2019-12-05 DIAGNOSIS — E1122 Type 2 diabetes mellitus with diabetic chronic kidney disease: Secondary | ICD-10-CM | POA: Diagnosis not present

## 2019-12-05 DIAGNOSIS — F0390 Unspecified dementia without behavioral disturbance: Secondary | ICD-10-CM | POA: Diagnosis not present

## 2019-12-05 LAB — CBC WITH DIFFERENTIAL/PLATELET
Basophils Absolute: 0.1 10*3/uL (ref 0.0–0.1)
Basophils Relative: 0.5 % (ref 0.0–3.0)
Eosinophils Absolute: 0.4 10*3/uL (ref 0.0–0.7)
Eosinophils Relative: 2.6 % (ref 0.0–5.0)
HCT: 50 % (ref 39.0–52.0)
Hemoglobin: 16.5 g/dL (ref 13.0–17.0)
Lymphocytes Relative: 19.5 % (ref 12.0–46.0)
Lymphs Abs: 2.7 10*3/uL (ref 0.7–4.0)
MCHC: 33 g/dL (ref 30.0–36.0)
MCV: 93.9 fl (ref 78.0–100.0)
Monocytes Absolute: 1.1 10*3/uL — ABNORMAL HIGH (ref 0.1–1.0)
Monocytes Relative: 8 % (ref 3.0–12.0)
Neutro Abs: 9.5 10*3/uL — ABNORMAL HIGH (ref 1.4–7.7)
Neutrophils Relative %: 69.4 % (ref 43.0–77.0)
Platelets: 262 10*3/uL (ref 150.0–400.0)
RBC: 5.32 Mil/uL (ref 4.22–5.81)
RDW: 14.3 % (ref 11.5–15.5)
WBC: 13.7 10*3/uL — ABNORMAL HIGH (ref 4.0–10.5)

## 2019-12-05 LAB — COMPREHENSIVE METABOLIC PANEL
ALT: 21 U/L (ref 0–53)
AST: 22 U/L (ref 0–37)
Albumin: 4.4 g/dL (ref 3.5–5.2)
Alkaline Phosphatase: 78 U/L (ref 39–117)
BUN: 23 mg/dL (ref 6–23)
CO2: 33 mEq/L — ABNORMAL HIGH (ref 19–32)
Calcium: 10.1 mg/dL (ref 8.4–10.5)
Chloride: 95 mEq/L — ABNORMAL LOW (ref 96–112)
Creatinine, Ser: 1.28 mg/dL (ref 0.40–1.50)
GFR: 53.55 mL/min — ABNORMAL LOW (ref >60.00)
Glucose, Bld: 111 mg/dL — ABNORMAL HIGH (ref 70–99)
Potassium: 4.3 mEq/L (ref 3.5–5.1)
Sodium: 138 mEq/L (ref 135–145)
Total Bilirubin: 0.9 mg/dL (ref 0.2–1.2)
Total Protein: 7.5 g/dL (ref 6.0–8.3)

## 2019-12-05 LAB — LIPID PANEL
Cholesterol: 119 mg/dL (ref 0–200)
HDL: 60.6 mg/dL (ref >39.00)
LDL Cholesterol: 48 mg/dL (ref 0–99)
NonHDL: 58.79
Total CHOL/HDL Ratio: 2
Triglycerides: 55 mg/dL (ref 0.0–149.0)
VLDL: 11 mg/dL (ref 0.0–40.0)

## 2019-12-05 LAB — URIC ACID: Uric Acid, Serum: 5 mg/dL (ref 4.0–7.8)

## 2019-12-05 LAB — VITAMIN B12: Vitamin B-12: 550 pg/mL (ref 211–911)

## 2019-12-05 LAB — HEMOGLOBIN A1C: Hgb A1c MFr Bld: 6.7 % — ABNORMAL HIGH (ref 4.6–6.5)

## 2019-12-05 LAB — HM DIABETES FOOT EXAM

## 2019-12-05 MED ORDER — PREGABALIN 50 MG PO CAPS: 50.0000 mg | ORAL_CAPSULE | Freq: Two times a day (BID) | ORAL | 3 refills | Status: DC

## 2019-12-05 NOTE — Assessment & Plan Note (Signed)
Due for re-eval. 

## 2019-12-05 NOTE — Assessment & Plan Note (Signed)
Euvolemic.  2016 EF back in nml range.

## 2019-12-05 NOTE — Assessment & Plan Note (Signed)
Due for re-eval.

## 2019-12-05 NOTE — Patient Instructions (Addendum)
Start lyrica  At 50 mg TWICE daily.  Call if makes you very sleepy.   Please stop at the lab to have labs drawn.

## 2019-12-05 NOTE — Assessment & Plan Note (Signed)
Well controlled. Continue current medication.

## 2019-12-05 NOTE — Progress Notes (Signed)
Chief Complaint  Patient presents with  . Medicare Wellness    History of Present Illness: HPI   The patient presents for annual medicare wellness, complete physical and review of chronic health problems. He/She also has the following acute concerns today: Bit in left ankle by cousins dog 2 days ago... scabbing. Mild pain, minimal redness around it.. he cleaned it aggressively.  Vaccinated for rabies.  Last Td 2011   Continued numbness across pad of feet. Aching in feet.  Worse with sitting.  Has PAD history .. but does not want to re-eval.. no claudication symptoms. Feet are cold. Pt not interested in further work up.   No improvement with Lyrica at  50 mg daily.Marland Kitchen stopped.  SE to gabapentin.  No redness in joints, no heat.. on allopurinol for gout.  I have personally reviewed the Medicare Annual Wellness questionnaire and have noted 1. The patient's medical and social history 2. Their use of alcohol, tobacco or illicit drugs 3. Their current medications and supplements 4. The patient's functional ability including ADL's, fall risks, home safety risks and hearing or visual             impairment. 5. Diet and physical activities 6. Evidence for depression or mood disorders 7.         Updated provider list Cognitive evaluation was performed and recorded on pt medicare questionnaire form. The patients weight, height, BMI and visual acuity have been recorded in the chart  I have made referrals, counseling and provided education to the patient based review of the above and I have provided the pt with a written personalized care plan for preventive services.   Documentation of this information was scanned into the electronic record under the media tab.   Advance directives and end of life planning reviewed in detail with patient and documented in EMR. Patient given handout on advance care directives if needed. HCPOA and living will updated if needed.   Hearing Screening   _0   _1  _2  _3  _4  _5  _6  _7  _8   Right ear:   25 25 40  0    Left ear:   0 40 0  0    Vision Screening Comments: Has vision checked in 10/2019 by Dr. Marvel Plan  Fall Risk  12/05/2019 11/22/2018 11/15/2017 09/29/2016 10/14/2015  Falls in the past year? 0 0 No No Yes  Number falls in past yr: - - - - 1  Injury with Fall? - - - - No   Depression screen [facility] 2/9 12/05/2019 11/22/2018 11/15/2017 09/29/2016 10/14/2015  Decreased Interest 0 0 0 0 0  Down, Depressed, Hopeless 0 0 0 0 0  PHQ - 2 Score 0 0 0 0 0  Altered sleeping - 0 0 - -  Tired, decreased energy - 0 0 - -  Change in appetite - 0 0 - -  Feeling bad or failure about yourself  - 0 0 - -  Trouble concentrating - 0 0 - -  Moving slowly or fidgety/restless - 0 0 - -  Suicidal thoughts - 0 0 - -  PHQ-9 Score - 0 0 - -  Difficult doing work/chores - Not difficult at all Not difficult at all - -    Patient Care Team: [name], MD as PCP - General Byrnett, Forest Gleason, MD (General Surgery) Venia Carbon, MD as Referring Physician (Internal Medicine) Agapito Games as Consulting Physician (Optometry)    Diabetes:  OVERDUE FOR LAB EVAL. Lab Results  Component Value  Date   HGBA1C 6.6 (H) 11/22/2018  Using medications without difficulties: Hypoglycemic episodes: Hyperglycemic episodes: Feet problems: Blood Sugars averaging: eye exam within last year:  Elevated Cholesterol: needs re-eval. Using medications without problems: Muscle aches:  Diet compliance: Exercise: Other complaints:  Mild dementia/memory loss  stable .. not interested in medication  Hypertension:    At goal BP Readings from Last 3 Encounters:  12/05/19 118/76  09/29/19 124/86  08/13/19 118/77  Using medication without problems or lightheadedness:  Chest pain with exertion: Edema: Short of breath: Average home BPs: Other issues:   CHF: euvolemic  idiopathic pulmonary fibrosis: Stable SOB.. If progressive will return  to see pulm. Pt does not wish to see pulm at this time, reviewed again 202, continues to not want to see pulm or take med to treat.   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  This visit occurred during the SARS-CoV-2 public health emergency.  Safety protocols were in place, including screening questions prior to the visit, additional usage of staff PPE, and extensive cleaning of exam room while observing appropriate contact time as indicated for disinfecting solutions.   COVID 19 screen:  No recent travel or known exposure to COVID19 The patient denies respiratory symptoms of COVID 19 at this time. The importance of social distancing was discussed today.     Review of Systems  Constitutional: Negative for chills and fever.  HENT: Negative for congestion and ear pain.   Eyes: Negative for pain and redness.  Respiratory: Negative for cough and shortness of breath.   Cardiovascular: Negative for chest pain, palpitations and leg swelling.  Gastrointestinal: Negative for abdominal pain, blood in stool, constipation, diarrhea, nausea and vomiting.  Genitourinary: Negative for dysuria.  Musculoskeletal: Negative for falls and myalgias.  Skin: Negative for rash.  Neurological: Negative for dizziness.  Psychiatric/Behavioral: Negative for depression. The patient is not nervous/anxious.       Past Medical History:  Diagnosis Date  . Anxiety   . Atrial flutter ([place])    "just dx'd today" (11/04/2013)  . Benign prostatic hypertrophy   . Borderline diabetes   . Colon, diverticulosis   . COPD (chronic obstructive pulmonary disease) ([place])   . Dysrhythmia   . Exertional shortness of breath   . Gout   . Hyperlipidemia   . Hypertension   . Osteoarthritis    "all over" (11/04/2013)  . PVD (peripheral vascular disease) ([place])     reports that he has quit smoking. His smoking use included cigarettes. He has a 90.00 pack-year smoking history. He has  quit using smokeless tobacco.  His smokeless tobacco use included chew. He reports that he does not drink alcohol or use drugs.   Current Outpatient Medications:  .  allopurinol (ZYLOPRIM) 100 MG tablet, TAKE 1 TABLET(100 MG) BY MOUTH DAILY, Disp: 90 tablet, Rfl: 2 .  atorvastatin (LIPITOR) 20 MG tablet, TAKE 1 TABLET(20 MG) BY MOUTH DAILY, Disp: 90 tablet, Rfl: 2 .  Cyanocobalamin (B-12 PO), Take 1 tablet by mouth daily., Disp: , Rfl:  .  hydrochlorothiazide (HYDRODIURIL) 25 MG tablet, TAKE 1 TABLET(25 MG) BY MOUTH DAILY, Disp: 90 tablet, Rfl: 2 .  pregabalin (LYRICA) 50 MG capsule, Take 1 capsule (50 mg total) by mouth daily. (Patient not taking: Reported on 12/27/2018), Disp: 30 capsule, Rfl: 3 .  saline (AYR) GEL, Place 1 application into the nose every 6 (six) hours. (Patient not taking: Reported on 12/05/2019), Disp: 14.1 g, Rfl: 3  Observations/Objective: Blood pressure 118/76, pulse 80, temperature 98.2 F (36.8 C), height _0  (1.651 m), weight 179 lb 12.8 oz (81.6 kg), SpO2 96 %.  Physical Exam Constitutional:      General: He is not in acute distress.    Appearance: Normal appearance. He is well-developed. He is obese. He is not ill-appearing or toxic-appearing.  HENT:     Head: Normocephalic and atraumatic.     Right Ear: Hearing, tympanic membrane, ear canal and external ear normal.     Left Ear: Hearing, tympanic membrane, ear canal and external ear normal.     Nose: Nose normal.     Mouth/Throat:     Pharynx: Uvula midline.  Eyes:     General: Lids are normal. Lids are everted, no foreign bodies appreciated.     Conjunctiva/sclera: Conjunctivae normal.     Pupils: Pupils are equal, round, and reactive to light.  Neck:     Thyroid: No thyroid mass or thyromegaly.     Vascular: No carotid bruit.     Trachea: Trachea and phonation normal.  Cardiovascular:     Rate and Rhythm: Normal rate and regular rhythm.     Pulses:          Dorsalis pedis pulses are 1+ on the right  side and 1+ on the left side.       Posterior tibial pulses are 1+ on the right side and 1+ on the left side.     Heart sounds: S1 normal and S2 normal. No murmur. No gallop.   Pulmonary:     Breath sounds: Examination of the right-lower field reveals rales. Examination of the left-lower field reveals rales. Rales present. No wheezing or rhonchi.  Abdominal:     General: Bowel sounds are normal.     Palpations: Abdomen is soft.     Tenderness: There is no abdominal tenderness. There is no guarding or rebound.     Hernia: No hernia is present. There is no hernia in the left inguinal area.  Genitourinary:    Penis: Normal. No paraphimosis or tenderness.      Testes: Normal.        Right: Mass or tenderness not present.        Left: Mass or tenderness not present.     Prostate: Normal. Not enlarged and not tender.     Rectum: Guaiac result negative. No mass, tenderness, anal fissure, external hemorrhoid or internal hemorrhoid. Normal anal tone.  Musculoskeletal:     Cervical back: Normal range of motion and neck supple.     Right foot: Normal range of motion. No deformity.     Left foot: Normal range of motion. No deformity.  Feet:     Right foot:     Protective Sensation: 3 sites tested. 0 sites sensed.     Skin integrity: Callus and dry skin present. No skin breakdown.     Toenail Condition: Right toenails are normal.     Left foot:     Protective Sensation: 3 sites tested. 0 sites sensed.     Skin integrity: Callus and dry skin present. No skin breakdown.     Toenail Condition: Left toenails are normal.     Comments: No tenderness to palpation in feet, no sign of gout flare Lymphadenopathy:     Cervical: No cervical adenopathy.  Skin:    General: Skin is warm and dry.     Findings: No rash.  Neurological:     Mental Status: He  is alert.     Cranial Nerves: No cranial nerve deficit.     Sensory: No sensory deficit.     Gait: Gait normal.     Deep Tendon Reflexes: Reflexes are  normal and symmetric.  Psychiatric:        Speech: Speech normal.        Behavior: Behavior normal.        Judgment: Judgment normal.      Diabetic foot exam: Normal inspection.Marland Kitchen except cold and purplish feet No skin breakdown No calluses  Normal DP pulses decreased sensation to light touch and monofilament Nails normal  Assessment and Plan   The patient's preventative maintenance and recommended screening tests for an annual wellness exam were reviewed in full today. Brought up to date unless services declined.  Counselled on the importance of diet, exercise, and its role in overall health and mortality. The patient's FH and SH was reviewed, including their home life, tobacco status, and drug and alcohol status.   UPTODATE with vaccines  PSA not indicated due to age.  Colon Ca [place] not indicated given age, last 2013  Vitamin B12 deficiency Due for re-eval.  Peripheral vascular disease due to secondary diabetes mellitus ([place]) Due for re-val but pt refuses. Foot pain not clearly claudication, but pulses slightly diminished and felt cold. Consider pushing eval and possible pletal if not improving foot pain at follow up.  Neuropathy due to secondary diabetes mellitus ([place]) Most likely cause of foot pain as well as numbness.  Will try higher doe of lyrica.  Mild dementia Stable per pt. Not currently interested in med to treat.  Idiopathic pulmonary fibrosis (HCC) Stable SOB.. If progressive will return to see pulm. Pt does not wish to see pulm at this time, reviewed again 202, continues to not want to see pulm or take med to treat.   HYPERTENSION, BENIGN ESSENTIAL Well controlled. Continue current medication.   HYPERCHOLESTEROLEMIA Due for re-eval.  DM (diabetes mellitus) with peripheral vascular complication (HCC) Due for re-eval.  Chronic systolic CHF (congestive heart failure) ([place]) Euvolemic.  2016 EF back in nml range.    Eliezer Lofts, MD

## 2019-12-05 NOTE — Assessment & Plan Note (Signed)
Stable SOB.. If progressive will return to see pulm. Pt does not wish to see pulm at this time, reviewed again 202, continues to not want to see pulm or take med to treat.

## 2019-12-05 NOTE — Assessment & Plan Note (Signed)
Stable per pt. Not currently interested in med to treat.

## 2019-12-05 NOTE — Assessment & Plan Note (Addendum)
Most likely cause of foot pain as well as numbness.  Will try higher doe of lyrica.

## 2019-12-05 NOTE — Addendum Note (Signed)
Addended by: Lurlean Nanny on: 12/05/2019 10:25 AM   Modules accepted: Orders

## 2019-12-05 NOTE — Assessment & Plan Note (Signed)
Due for re-val but pt refuses. Foot pain not clearly claudication, but pulses slightly diminished and felt cold. Consider pushing eval and possible pletal if not improving foot pain at follow up.

## 2020-01-01 ENCOUNTER — Other Ambulatory Visit: Payer: Self-pay

## 2020-01-01 ENCOUNTER — Encounter: Payer: Self-pay | Admitting: Family Medicine

## 2020-01-01 ENCOUNTER — Ambulatory Visit (INDEPENDENT_AMBULATORY_CARE_PROVIDER_SITE_OTHER): Payer: PPO | Admitting: Family Medicine

## 2020-01-01 VITALS — BP 128/82 | HR 56 | Temp 98.1°F | Ht 65.0 in | Wt 186.5 lb

## 2020-01-01 DIAGNOSIS — E1340 Other specified diabetes mellitus with diabetic neuropathy, unspecified: Secondary | ICD-10-CM

## 2020-01-01 DIAGNOSIS — I5022 Chronic systolic (congestive) heart failure: Secondary | ICD-10-CM

## 2020-01-01 DIAGNOSIS — E1351 Other specified diabetes mellitus with diabetic peripheral angiopathy without gangrene: Secondary | ICD-10-CM | POA: Diagnosis not present

## 2020-01-01 DIAGNOSIS — J84112 Idiopathic pulmonary fibrosis: Secondary | ICD-10-CM

## 2020-01-01 MED ORDER — ALLOPURINOL 100 MG PO TABS: ORAL_TABLET | ORAL | 3 refills | Status: DC

## 2020-01-01 MED ORDER — HYDROCHLOROTHIAZIDE 25 MG PO TABS: ORAL_TABLET | ORAL | 3 refills | Status: DC

## 2020-01-01 MED ORDER — ATORVASTATIN CALCIUM 20 MG PO TABS: ORAL_TABLET | ORAL | 3 refills | Status: DC

## 2020-01-01 NOTE — Patient Instructions (Signed)
Continue current dose of lyrica twice daily. If shortness of breath is worsening.. call for possible cardiac eval and pulmonary referral.

## 2020-01-01 NOTE — Assessment & Plan Note (Signed)
Stable SOB.

## 2020-01-01 NOTE — Assessment & Plan Note (Addendum)
Improved control of pain.  Minimal SE on low dose lyrica. Tolerating well.

## 2020-01-01 NOTE — Assessment & Plan Note (Signed)
Stable SOB, euvolemic.

## 2020-01-01 NOTE — Assessment & Plan Note (Signed)
Denies claudication symptoms at this time.

## 2020-01-01 NOTE — Progress Notes (Signed)
Chief Complaint  Patient presents with  . Follow-up    History of Present Illness: HPI  86 year old male with  Peripheral neuropathy due to well controlled diabetes, PVD   At last OV 1 month ago lyrica dose was increased to  50 mg twice daily.  he has noted improvement in sensation in feet.. he is  Tolerating pain. More occ pain.  Pain is nott changing his activity or keeping up at night. He denies any new drowsiness or dizziness.  No falls.  Denies gout flare or claudication symptoms.  He is due for refills of his gout preventative, diuretic and cholesterol emdication.    No swelling.   He states he is SOB ever since past heart issue. No progression or change.  Only happens if he really pushes himself.  Occ epigastric pain if pushing himself really hard with physical exertion.Marland Kitchen no chest pain. He does not want to have further eval at this time. " I am wearing out and getting old"  This visit occurred during the SARS-CoV-2 public health emergency.  Safety protocols were in place, including screening questions prior to the visit, additional usage of staff PPE, and extensive cleaning of exam room while observing appropriate contact time as indicated for disinfecting solutions.   COVID 19 screen:  No recent travel or known exposure to COVID19 The patient denies respiratory symptoms of COVID 19 at this time. The importance of social distancing was discussed today.     Review of Systems  Constitutional: Negative for chills and fever.  HENT: Negative for congestion and ear pain.   Eyes: Negative for pain and redness.  Respiratory: Positive for shortness of breath. Negative for cough and wheezing.   Cardiovascular: Negative for chest pain, palpitations and leg swelling.  Gastrointestinal: Negative for abdominal pain, blood in stool, constipation, diarrhea, nausea and vomiting.  Genitourinary: Negative for dysuria.  Musculoskeletal: Negative for falls and myalgias.  Skin:  Negative for rash.  Neurological: Negative for dizziness.  Psychiatric/Behavioral: Negative for depression. The patient is not nervous/anxious.       Past Medical History:  Diagnosis Date  . Anxiety   . Atrial flutter ([place])    "just dx'd today" (11/04/2013)  . Benign prostatic hypertrophy   . Borderline diabetes   . Colon, diverticulosis   . COPD (chronic obstructive pulmonary disease) ([place])   . Dysrhythmia   . Exertional shortness of breath   . Gout   . Hyperlipidemia   . Hypertension   . Osteoarthritis    "all over" (11/04/2013)  . PVD (peripheral vascular disease) ([place])     reports that he has quit smoking. His smoking use included cigarettes. He has a 90.00 pack-year smoking history. He has quit using smokeless tobacco.  His smokeless tobacco use included chew. He reports that he does not drink alcohol or use drugs.   Current Outpatient Medications:  .  allopurinol (ZYLOPRIM) 100 MG tablet, TAKE 1 TABLET(100 MG) BY MOUTH DAILY, Disp: 90 tablet, Rfl: 2 .  atorvastatin (LIPITOR) 20 MG tablet, TAKE 1 TABLET(20 MG) BY MOUTH DAILY, Disp: 90 tablet, Rfl: 2 .  Cyanocobalamin (B-12 PO), Take 1 tablet by mouth daily., Disp: , Rfl:  .  fluorometholone (FML) 0.1 % ophthalmic suspension, 1 drop 2 (two) times daily., Disp: , Rfl:  .  hydrochlorothiazide (HYDRODIURIL) 25 MG tablet, TAKE 1 TABLET(25 MG) BY MOUTH DAILY, Disp: 90 tablet, Rfl: 2 .  pregabalin (LYRICA) 50 MG capsule, Take 1 capsule (50 mg total) by mouth 2 (  two) times daily., Disp: 60 capsule, Rfl: 3   Observations/Objective: Blood pressure 128/82, pulse (!) 56, temperature 98.1 F (36.7 C), temperature source Temporal, height 5' 5" (1.651 m), weight 186 lb 8 oz (84.6 kg), SpO2 93 %.  Physical Exam Constitutional:      Appearance: He is well-developed. He is obese.     Comments: Elderly male in NAD  HENT:     Head: Normocephalic.     Right Ear: Hearing normal.     Left Ear: Hearing normal.     Nose: Nose normal.  Neck:      Thyroid: No thyroid mass or thyromegaly.     Vascular: No carotid bruit.     Trachea: Trachea normal.  Cardiovascular:     Rate and Rhythm: Normal rate and regular rhythm.     Pulses: Normal pulses.     Heart sounds: Heart sounds not distant. No murmur. No friction rub. No gallop.      Comments: No peripheral edema Pulmonary:     Effort: Pulmonary effort is normal. No respiratory distress.     Breath sounds: Examination of the right-lower field reveals rales. Examination of the left-lower field reveals rales. Rales present.  Skin:    General: Skin is warm and dry.     Findings: No rash.  Psychiatric:        Speech: Speech normal.        Behavior: Behavior normal.        Thought Content: Thought content normal.      Assessment and Plan   Neuropathy due to secondary diabetes mellitus ([place]) Improved control of pain.  Minimal SE on low dose lyrica. Tolerating well.  Peripheral vascular disease due to secondary diabetes mellitus ([place]) Denies claudication symptoms at this time.  Chronic systolic CHF (congestive heart failure) (HCC) Stable SOB, euvolemic.  Idiopathic pulmonary fibrosis (HCC) Stable SOB.     Eliezer Lofts, MD

## 2020-02-05 ENCOUNTER — Encounter: Payer: Self-pay | Admitting: Cardiovascular Disease

## 2020-02-05 ENCOUNTER — Other Ambulatory Visit: Payer: Self-pay

## 2020-02-05 ENCOUNTER — Ambulatory Visit (INDEPENDENT_AMBULATORY_CARE_PROVIDER_SITE_OTHER): Payer: PPO | Admitting: Cardiovascular Disease

## 2020-02-05 VITALS — BP 160/100 | HR 77 | Ht 65.0 in | Wt 183.4 lb

## 2020-02-05 DIAGNOSIS — Z8679 Personal history of other diseases of the circulatory system: Secondary | ICD-10-CM

## 2020-02-05 DIAGNOSIS — R072 Precordial pain: Secondary | ICD-10-CM | POA: Diagnosis not present

## 2020-02-05 DIAGNOSIS — I10 Essential (primary) hypertension: Secondary | ICD-10-CM

## 2020-02-05 DIAGNOSIS — R0602 Shortness of breath: Secondary | ICD-10-CM | POA: Diagnosis not present

## 2020-02-05 NOTE — Patient Instructions (Signed)
Medication Instructions:  Your physician recommends that you continue on your current medications as directed. Please refer to the Current Medication list given to you today.  *If you need a refill on your cardiac medications before your next appointment, please call your pharmacy*   Lab Work: None ordered If you have labs (blood work) drawn today and your tests are completely normal, you will receive your results only by: Marland Kitchen MyChart Message (if you have MyChart) OR . A paper copy in the mail If you have any lab test that is abnormal or we need to change your treatment, we will call you to review the results.   Testing/Procedures: Your physician has requested that you have an echocardiogram. Echocardiography is a painless test that uses sound waves to create images of your heart. It provides your doctor with information about the size and shape of your heart and how well your heart's chambers and valves are working. This procedure takes approximately one hour. There are no restrictions for this procedure.  Your physician has requested that you have a lexiscan myoview. For further information please visit [email or web address]. Please follow instruction sheet, as given.     Follow-Up: At [facility], you and your health needs are our priority.  As part of our continuing mission to provide you with exceptional heart care, we have created designated Provider Care Teams.  These Care Teams include your primary Cardiologist (physician) and Advanced Practice Providers (APPs -  Physician Assistants and Nurse Practitioners) who all work together to provide you with the care you need, when you need it.  We recommend signing up for the patient portal called "MyChart".  Sign up information is provided on this After Visit Summary.  MyChart is used to connect with patients for Virtual Visits (Telemedicine).  Patients are able to view lab/test results, encounter notes, upcoming appointments, etc.   Non-urgent messages can be sent to your provider as well.   To learn more about what you can do with MyChart, go to [email or web address].    Your next appointment:   4 week(s)  The format for your next appointment:   In Person  Provider:    You may see Dr. Fletcher Anon or one of the following Advanced Practice Providers on your designated Care Team:    Murray Hodgkins, NP  Christell Faith, PA-C  Marrianne Mood, PA-C    Other Instructions [place]  Your caregiver has ordered a Stress Test with nuclear imaging. The purpose of this test is to evaluate the blood supply to your heart muscle. This procedure is referred to as a "Non-Invasive Stress Test." This is because other than having an IV started in your vein, nothing is inserted or "invades" your body. Cardiac stress tests are done to find areas of poor blood flow to the heart by determining the extent of coronary artery disease (CAD). Some patients exercise on a treadmill, which naturally increases the blood flow to your heart, while others who are  unable to walk on a treadmill due to physical limitations have a pharmacologic/chemical stress agent called Lexiscan . This medicine will mimic walking on a treadmill by temporarily increasing your coronary blood flow.   Please note: these test may take anywhere between 2-4 hours to complete  PLEASE REPORT TO [facility] AT THE FIRST DESK WILL DIRECT YOU WHERE TO GO  Date of Procedure:_____________________________________  Arrival Time for Procedure:______________________________  Instructions regarding medication:    __X__:  Hold other medications as follows:___HCTZ  the morning of the test  PLEASE NOTIFY THE OFFICE AT LEAST 24 HOURS IN ADVANCE IF YOU ARE UNABLE TO KEEP YOUR APPOINTMENT.  [phone] AND  PLEASE NOTIFY NUCLEAR MEDICINE AT [facility] AT LEAST 24 HOURS IN ADVANCE IF YOU ARE UNABLE TO KEEP YOUR APPOINTMENT. [phone]  How to prepare for  your Myoview test:  1. Do not eat or drink after midnight 2. No caffeine for 24 hours prior to test 3. No smoking 24 hours prior to test. 4. Your medication may be taken with water.  If your doctor stopped a medication because of this test, do not take that medication. 5. Ladies, please do not wear dresses.  Skirts or pants are appropriate. Please wear a short sleeve shirt. 6. No perfume, cologne or lotion. 7. Wear comfortable walking shoes. No heels!

## 2020-02-05 NOTE — Progress Notes (Signed)
Cardiology Office Note   Date:  02/05/2020   ID:  Caleb Nevin Spurling Sr., DOB 1934/05/17, MRN [id number]  PCP:  Caleb Sanders, MD  Cardiologist:   Caleb Sacramento, MD   Chief Complaint  Patient presents with  . New Patient (Initial Visit)    Chest pain/SOB; Meds verbally reviewed with patient.      History of Present Illness: Caleb Schleicher Sr. is a 86 y.o. male who presents for evaluation of chest pain and shortness of breath.  He was most recently seen by me in February 2018.     He has known history of atrial flutter status post ablation.   He has known history of type 2 diabetes not on medications, previous tobacco use, hypertension and hyperlipidemia.  He was diagnosed with atrial flutter in January 2015 with cardiomyopathy and ejection fraction of 25 to 30%.  He underwent cardiac catheterization which showed minor irregularities with no obstructive disease. He underwent catheter ablation by Dr. Lovena Le in February 4010 with no complications.  Ejection fraction improved gradually in most recent echocardiogram in August 2016 showed normal LV systolic function. He is also known to have small ascending aortic aneurysm at 4.1 cm.  He has history of pulmonary fibrosis.  He used to work in a [employer].  Over the last year, he experienced significant worsening of exertional dyspnea which is happening when he does his yard work.  Occasionally, this is associated with substernal chest pain and aching.  His symptoms improved with rest.  No orthopnea, PND or lower extremity edema.  Past Medical History:  Diagnosis Date  . Anxiety   . Atrial flutter ([place])    "just dx'd today" (11/04/2013)  . Benign prostatic hypertrophy   . Borderline diabetes   . Colon, diverticulosis   . COPD (chronic obstructive pulmonary disease) ([place])   . Dysrhythmia   . Exertional shortness of breath   . Gout   . Hyperlipidemia   . Hypertension   . Osteoarthritis    "all over" (11/04/2013)  . PVD  (peripheral vascular disease) ([place])     Past Surgical History:  Procedure Laterality Date  . albation  11/04/13  . ANKLE FRACTURE SURGERY Right   . APPENDECTOMY  1997   "days before 1st colon OR"  . ATRIAL FLUTTER ABLATION N/A 11/26/2013   Procedure: ATRIAL FLUTTER ABLATION;  Surgeon: Evans Lance, MD;  Location: [facility] CATH LAB;  Service: Cardiovascular;  Laterality: N/A;  . CATARACT EXTRACTION, BILATERAL Bilateral   . CHOLECYSTECTOMY  12/03  . COLON SURGERY     "diverticulitis"  . COLONOSCOPY WITH ESOPHAGOGASTRODUODENOSCOPY (EGD)  12/00   gastic polyp   . COLONOSCOPY WITH ESOPHAGOGASTRODUODENOSCOPY (EGD)  11/07,12/2011   gastritis, 2 gastic polyps  . COLOSTOMY  1997  . COLOSTOMY REVERSAL     "think I wore it 4 months" (11/04/2013)  . CORNEAL TRANSPLANT Right   . CORONARY ANGIOGRAM  11/05/2013   Procedure: CORONARY ANGIOGRAM;  Surgeon: Caleb Hampshire, MD;  Location: [facility] CATH LAB;  Service: Cardiovascular;;  . ESOPHAGOGASTRODUODENOSCOPY (EGD) WITH PROPOFOL N/A 08/13/2019   Procedure: ESOPHAGOGASTRODUODENOSCOPY (EGD) WITH PROPOFOL;  Surgeon: Caleb Bellow, MD;  Location: [facility] ENDOSCOPY;  Service: Endoscopy;  Laterality: N/A;  . HERNIA REPAIR  11/05/14   ventral hernia  . INGUINAL HERNIA REPAIR Left   . PARTIAL COLECTOMY  1997   "diverticulitis; busted; Caleb Bryant"  . stress cardiolite negative  9/02   EF 60%  . TONSILLECTOMY AND ADENOIDECTOMY  child  . VENTRAL HERNIA REPAIR  11/07   Caleb Bryant     Current Outpatient Medications  Medication Sig Dispense Refill  . allopurinol (ZYLOPRIM) 100 MG tablet TAKE 1 TABLET(100 MG) BY MOUTH DAILY 90 tablet 3  . atorvastatin (LIPITOR) 20 MG tablet Take one table by mouth daily 90 tablet 3  . fluorometholone (FML) 0.1 % ophthalmic suspension 1 drop 2 (two) times daily.    . hydrochlorothiazide (HYDRODIURIL) 25 MG tablet Take one tablet by mouth daily 90 tablet 3  . pregabalin (LYRICA) 50 MG capsule Take 1 capsule (50 mg total) by mouth 2 (two)  times daily. 60 capsule 3  . Cyanocobalamin (B-12 PO) Take 1 tablet by mouth daily.     No current facility-administered medications for this visit.    Allergies:   Codeine    Social History:  The patient  reports that he has quit smoking. His smoking use included cigarettes. He has a 90.00 pack-year smoking history. He has quit using smokeless tobacco.  His smokeless tobacco use included chew. He reports that he does not drink alcohol or use drugs.   Family History:  The patient's family history includes Coronary artery disease in his sister; Heart failure in his mother.    ROS:  Please see the history of present illness.   Otherwise, review of systems are positive for none.   All other systems are reviewed and negative.    PHYSICAL EXAM: VS:  BP (!) 160/100 (BP Location: Right Arm, Patient Position: Sitting, Cuff Size: Normal)   Pulse 77   Ht _0  (1.651 m)   Wt 183 lb 6 oz (83.2 kg)   SpO2 92%   BMI 30.52 kg/m  , BMI Body mass index is 30.52 kg/m. GEN: Well nourished, well developed, in no acute distress  HEENT: normal  Neck: no JVD, carotid bruits, or masses Cardiac: RRR; no  rubs, or gallops,no edema . One out of 6 systolic ejection murmur in the aortic area Respiratory: Bilateral dry crackles at both bases consistent with pulmonary fibrosis. GI: soft, nontender, nondistended, + BS MS: no deformity or atrophy  Skin: warm and dry, no rash Neuro:  Strength and sensation are intact Psych: euthymic mood, full affect   EKG:  EKG is ordered today. The ekg ordered today demonstrates normal sinus rhythm with right bundle branch block and left anterior fascicular block.   Recent Labs: 12/05/2019: ALT 21; BUN 23; Creatinine, Ser 1.28; Hemoglobin 16.5; Platelets 262.0; Potassium 4.3; Sodium 138    Lipid Panel    Component Value Date/Time   CHOL 119 12/05/2019 1006   TRIG 55.0 12/05/2019 1006   HDL 60.60 12/05/2019 1006   CHOLHDL 2 12/05/2019 1006   VLDL 11.0 12/05/2019  1006   LDLCALC 48 12/05/2019 1006      Wt Readings from Last 3 Encounters:  02/05/20 183 lb 6 oz (83.2 kg)  01/01/20 186 lb 8 oz (84.6 kg)  12/05/19 179 lb 12.8 oz (81.6 kg)       PAD Screen 02/05/2020  Previous PAD dx? No  Previous surgical procedure? No  Pain with walking? No  Feet/toe relief with dangling? No  Painful, non-healing ulcers? No  Extremities discolored? No      ASSESSMENT AND PLAN:  1.  History of atrial flutter status post successful ablation: No evidence of recurrent arrhythmia.  2. History of tachycardia-induced cardiomyopathy: Most recent ejection fraction was normal in 2016.  However, he now reports significant worsening of exertional dyspnea and thus  I am going to repeat his echocardiogram.  3.  Exertional chest pain: Most of his symptoms seem to be related shortness of breath but occasionally has associated chest discomfort.  Previous cardiac catheterization in 2015 showed minor irregularities with no obstructive disease.  I am going to obtain a [employer].  4. Small ascending aortic aneurysm: Ascending aorta and aortic root will be evaluated with echocardiogram.  5.  Essential hypertension: His blood pressure is elevated today but normally his blood pressure is controlled.  He seems to be anxious about being here.  I made no changes.  6.  Pulmonary fibrosis: if cardiac work-up is unremarkable, consider pulmonary evaluation.   Disposition:   FU with me in 1 month  Signed,  Caleb Sacramento, MD  02/05/2020 2:06 PM    [place]

## 2020-02-18 ENCOUNTER — Telehealth: Payer: Self-pay | Admitting: Family Medicine

## 2020-02-18 NOTE — Progress Notes (Signed)
  Chronic Care Management   Outreach Note  02/18/2020 Name: Caleb Manzer [place] Sr. MRN: [id number] DOB: [birth year]  Referred by: [name], MD Reason for referral : No chief complaint on file.   An unsuccessful telephone outreach was attempted today. The patient was referred to the pharmacist for assistance with care management and care coordination.   Follow Up Plan:   Raynicia Dukes [employer] Scheduler

## 2020-02-18 NOTE — Chronic Care Management (AMB) (Signed)
  Chronic Care Management   Note  02/18/2020 Name: [name] Sr. MRN: [id number] DOB: 1933/11/11  Angela Nevin Heng Sr. is a 86 y.o. year old male who is a primary care patient of Bedsole, Amy E, MD. I reached out to [employer] Sr. by phone today in response to a referral sent by [name]'s PCP, [name], MD.   [name] was given information about Chronic Care Management services today including:  1. CCM service includes personalized support from designated clinical staff supervised by his physician, including individualized plan of care and coordination with other care providers 2. 24/7 contact phone numbers for assistance for urgent and routine care needs. 3. Service will only be billed when office clinical staff spend 20 minutes or more in a month to coordinate care. 4. Only one practitioner may furnish and bill the service in a calendar month. 5. The patient may stop CCM services at any time (effective at the end of the month) by phone call to the office staff.   Patient agreed to services and verbal consent obtained.   Follow up plan:   Raynicia Dukes [employer] Scheduler

## 2020-02-27 ENCOUNTER — Other Ambulatory Visit: Payer: PPO

## 2020-03-02 DIAGNOSIS — M545 Low back pain: Secondary | ICD-10-CM | POA: Diagnosis not present

## 2020-03-05 ENCOUNTER — Telehealth: Payer: Self-pay

## 2020-03-05 DIAGNOSIS — I10 Essential (primary) hypertension: Secondary | ICD-10-CM

## 2020-03-05 DIAGNOSIS — E1151 Type 2 diabetes mellitus with diabetic peripheral angiopathy without gangrene: Secondary | ICD-10-CM

## 2020-03-05 NOTE — Telephone Encounter (Signed)
Per written referral from PCP, requesting referral in Epic for Caleb Bryant. to chronic care management pharmacy services for the following conditions:   Essential hypertension, benign  [I10]  DM (diabetes mellitus) with peripheral vascular complication [B39.67]  Debbora Dus, PharmD Clinical Pharmacist [facility] at [facility] [phone]

## 2020-03-08 NOTE — Telephone Encounter (Signed)
Referral placed.

## 2020-03-10 ENCOUNTER — Other Ambulatory Visit: Payer: PPO

## 2020-03-12 ENCOUNTER — Ambulatory Visit: Payer: PPO | Admitting: Family

## 2020-03-15 ENCOUNTER — Other Ambulatory Visit: Payer: Self-pay

## 2020-03-15 ENCOUNTER — Ambulatory Visit: Payer: PPO

## 2020-03-15 DIAGNOSIS — E7800 Pure hypercholesterolemia, unspecified: Secondary | ICD-10-CM

## 2020-03-15 DIAGNOSIS — I10 Essential (primary) hypertension: Secondary | ICD-10-CM

## 2020-03-15 NOTE — Chronic Care Management (AMB) (Signed)
Chronic Care Management Pharmacy  Name: Caleb Bryant [facility] Sr.  MRN: [id number] DOB: 21-Feb-1934  Chief Complaint/ HPI  Caleb Nevin [place] Sr.,  86 y.o., male presents for their Initial CCM visit with the clinical pharmacist via telephone. Patient requested visit be completed with his wife, Caleb Bryant.   PCP : [name], MD  Their chronic conditions include: hypertension, PVD, diabetes, chronic systolic heart failure, allergic rhinitis, diverticulosis, GERD, neuropathy, CKD stage 3, mild dementia, osteoarthritis, gout, DDD, BPH, vitamin B12 deficiency, hypercholesterolemia  Patient concerns: still having some shortness of breath, pt decided to cancel echo and stress test with cardiology as he believes his dyspnea is primarily related to COPD  Office Visits:  01/01/20: Diona Browner - neuropathy, Lyrica increase in February 2021, noticed improvement, denies gout flares, SOB, declines further eval  12/05/19: Bedsole - try higher dose of Lyrica  Consult Visit:  02/05/20: Cardiology - Aflutter s/p ablation 2015, worsening dyspnea with yard work over the past year, history of working in [employer], prior smoker, pulmonary fibrosis, order echo, rtc 1 month  Allergies  Allergen Reactions  . Codeine Nausea And Vomiting   Medications: Outpatient Encounter Medications as of 03/15/2020  Medication Sig  . allopurinol (ZYLOPRIM) 100 MG tablet TAKE 1 TABLET(100 MG) BY MOUTH DAILY  . atorvastatin (LIPITOR) 20 MG tablet Take one table by mouth daily  . Cyanocobalamin (B-12 PO) Take 1 tablet by mouth daily.  . fluorometholone (FML) 0.1 % ophthalmic suspension 1 drop 2 (two) times daily.  . hydrochlorothiazide (HYDRODIURIL) 25 MG tablet Take one tablet by mouth daily  . pregabalin (LYRICA) 50 MG capsule Take 1 capsule (50 mg total) by mouth 2 (two) times daily.   No facility-administered encounter medications on file as of 03/15/2020.   Current Diagnosis/Assessment:   Emergency planning/management officer Strain: Low  Risk   . Difficulty of Paying Living Expenses: Not hard at all   Goals Addressed            This Visit's Progress   . Pharmacy Care Plan       CARE PLAN ENTRY  Current Barriers:  . Chronic Disease Management support, education, and care coordination needs related to Hypertension and peripheral artery disease   Hypertension . Pharmacist Clinical Goal(s): o Over the next 2 weeks, patient will work with PharmD and providers to achieve BP goal <140/90 mmHg . Current regimen:  o Hydrochlorothiazide 25 mg - 1 tablet daily . Interventions: o Recommend checking blood pressure . Patient self care activities - Over the next 2 weeks, patient will: o Check blood pressure three days per week, document, and provide at future appointment - Pharmacist will follow up in 2 weeks  o Ensure daily salt intake < 2300 mg/day  Peripheral Artery Disease . Pharmacist Clinical Goal(s): o Over the next 6 months, patient will work with PharmD and providers to reduce cardiovascular risk . Current regimen:  o No pharmacotherapy . Interventions: o Recommend aspirin 81 mg daily . Patient self care activities - Over the next 6 months, patient will: o Restart aspirin 81 mg daily  o Call if any abnormal bruising or bleeding  Additional Recommendations: . Avoid additional non-steroidal anti-inflammatories for pain (Goody's powder, Advil (ibuprofen), Aleve (naproxen), Excedrin products) due to increasing risks with daily meloxicam. Use Tylenol or topical products for additional pain relief.  . Recommend updating vaccines: Tetanus (Tdap) and 2-dose series of shingles vaccines (Shingrix) recommended; Both can be given at your local pharmacy. . Incorporate more lean meats, fruits, and  vegetables into diet. . Consider trying a maintenance inhaler for COPD in additional to your rescue inhaler (albuterol).   Initial goal documentation      Hypertension   CMP Latest Ref Rng & Units 12/05/2019 11/22/2018 04/15/2018   Glucose 70 - 99 mg/dL 111(H) 110(H) 113(H)  BUN 6 - 23 mg/dL _0 Creatinine 0.40 - 1.50 mg/dL 1.28 1.17 1.12  Sodium 135 - 145 mEq/L 138 138 140  Potassium 3.5 - 5.1 mEq/L 4.3 4.3 4.5  Chloride 96 - 112 mEq/L 95(L) 99 98  CO2 19 - 32 mEq/L 33(H) 30 33(H)  Calcium 8.4 - 10.5 mg/dL 10.1 9.7 9.9  Total Protein 6.0 - 8.3 g/dL 7.5 7.5 7.2  Total Bilirubin 0.2 - 1.2 mg/dL 0.9 0.8 0.7  Alkaline Phos 39 - 117 U/L 78 63 64  AST 0 - 37 U/L _1 ALT 0 - 53 U/L _2 Office blood pressures are: BP Readings from Last 3 Encounters:  02/05/20 (!) 160/100  01/01/20 128/82  12/05/19 118/76   BP goal < 140/90 mmHg Patient has failed these meds in the past: none  Patient checks BP at home several times per month Patient home BP readings are ranging: 150s/90s  Patient is currently uncontrolled on the following medications:   Hydrochlorothiazide 25 mg - 1 tablet daily  We discussed: recommend checking BP more frequently   Plan: Continue current medications; Recommend checking BP 3 days per week for the next 2 weeks. Follow up call to assess.   COPD   Spirometry: 12/2015 FEV1 62% Former smoker Patient has failed these meds in past: Spiriva 2017 - too expensive  Patient is currently controlled on the following medications:   Albuterol 90 mcg - 2 puffs every 4-6 hours PRN  We discussed: Suggested retrial of maintenance inhaler due to frequent SOB and rescue inhaler use, due to cost concerns, pt would like to continue current therapy; Reports seeing a NP from [facility] in the home several weeks ago who prescribed albuterol and was able to provided at low cost ($30 for 3 inhalers). Reports NP visits the home monthly and they have 24/7 telephone access.   Exercise: Mows lawns/weedeating, can only weed-eat for 15 min intervals due to SOB  Plan: Continue current medications; Recommend maintenance inhaler for COPD if affordable.   Hyperlipidemia/PAD   Lipid Panel     Component  Value Date/Time   CHOL 119 12/05/2019 1006   TRIG 55.0 12/05/2019 1006   HDL 60.60 12/05/2019 1006   CHOLHDL 2 12/05/2019 1006   VLDL 11.0 12/05/2019 1006   LDLCALC 48 12/05/2019 1006    CBC Latest Ref Rng & Units 12/05/2019 08/11/2019 10/27/2014  WBC 4.0 - 10.5 K/uL 13.7(H) 10.5 9.7  Hemoglobin 13.0 - 17.0 g/dL 16.5 15.4 16.5  Hematocrit 39.0 - 52.0 % 50.0 47.0 51.3  Platelets 150.0 - 400.0 K/uL 262.0 240 183   LDL goal < 70 Patient has failed these meds in past: none reported Patient is currently controlled on the following medications:   Atorvastatin 20 mg - 1 tablet daily  We discussed:  Recommended starting aspirin 81 mg due to PAD, per chart review, patient previously on aspirin and no clear reason noted for discontinuation   Plan: Continue current medications; Recommend resuming aspirin 81 mg daily due to PAD.  Diabetes   Recent Relevant Labs: Lab Results  Component Value Date/Time   HGBA1C 6.7 (H) 12/05/2019 10:06 AM   HGBA1C 6.6 (H)  11/22/2018 08:29 AM   MICROALBUR 2.4 (H) 11/22/2018 08:29 AM   MICROALBUR 1.3 11/15/2017 08:33 AM    Checking BG: Never, does not have glucometer  Patient has failed these meds in past: none Patient is currently controlled on the following medications:   No pharmacotherapy  Last diabetic eye exam:  Lab Results  Component Value Date/Time   HMDIABEYEEXA No Retinopathy 07/15/2015 12:00 AM    Last diabetic foot exam:  Lab Results  Component Value Date/Time   HMDIABFOOTEX done 12/05/2019 12:00 AM    We discussed: reports poor diet, minimal exercise - red meats, fried chicken, sweet tea, pepsi (has cut back some to < 1/day), Gatorade without sugar, does not drink water; freedom with diet is important to patient   Plan: Continue current medications; Recommend incorporating more lean meats, fruits, and vegetables into diet.  Heart Failure   Type: Systolic   Last ejection fraction: August 2016 - LVEF normal (10/2013 - LVEF  25-30%) NYHA Class: II (slight limitation of activity) AHA HF Stage: C (Heart disease and symptoms present)  Patient has failed these meds in past: none  Patient is currently controlled on the following medications:   No pharmacotherapy  We discussed: believes SOB is primarily due to COPD, declined further cardiac evaluation 01/2020  Plan: Continue current medications   Gout   Uric Acid 11/03/2013 2.6 Patient has failed these meds in past: none reported Patient is currently controlled on the following medications:   Allopurinol 100 mg - 1 tablet daily  We discussed: denies recent gout flares  Plan: Continue current medications   Neuropathy   Patient has failed these meds in past: gabapentin  Patient is currently controlled on the following medications:   Pregabalin (Lyrica) 50 mg - 1 capsule BID  Meloxicam 7.5 mg - 1 tablet daily (osteoarthritis)  We discussed: reports well controlled, pt takes Advil PRN headache or back pain and occasional BC for headaches, counseled on avoiding additional NSAIDs and taking Tylenol instead  Plan: Continue current medications; Avoid OTC NSAIDs.  Vaccines   Reviewed and discussed patient's vaccination history.    Immunization History  Administered Date(s) Administered  . Influenza Split 07/31/2011, 08/08/2012, 09/01/2013  . Influenza Whole 10/05/2004, 08/16/2007, 08/11/2008, 08/06/2009  . Influenza, High Dose Seasonal PF 07/27/2016, 08/09/2017, 08/01/2018, 07/25/2019  . Influenza,inj,Quad PF,6+ Mos 06/22/2014, 10/14/2015  . Influenza-Unspecified 08/30/2018  . Pneumococcal Conjugate-13 06/22/2014  . Pneumococcal Polysaccharide-23 04/14/2009  . Td 10/31/1997, 05/13/2010, 12/05/2019   Plan: Recommended patient receive Tdap, Shingrix   Medication Management  Misc: FML 0.1% ophthalmic suspension - 1 drop in each eye BID for dry eyes   OTCs: Vitamin B12 1000 mcg - daily, vitamin C - daily, Alive multivitamin,   Pharmacy/Benefits:  [facility]  Adherence: no concerns, refills timely   Social support: long-term church friends, family  Affordability: current medications are affordable   CCM Follow Up: 03/30/20 at 8:30 AM (telephone) for BP log   Debbora Dus, PharmD Clinical Pharmacist [facility] at [facility] [phone]

## 2020-03-15 NOTE — Patient Instructions (Addendum)
Mar 15, 2020  Dear Caleb Alexanders Sr.,  It was a pleasure meeting you and your wife during our initial appointment on Mar 15, 2020. Below is a summary of the goals we discussed and components of chronic care management. Please contact me anytime with questions or concerns.   Visit Information  Goals Addressed            This Visit's Progress   . Pharmacy Care Plan       CARE PLAN ENTRY  Current Barriers:  . Chronic Disease Management support, education, and care coordination needs related to Hypertension and peripheral artery disease   Hypertension . Pharmacist Clinical Goal(s): o Over the next 2 weeks, patient will work with PharmD and providers to achieve BP goal <140/90 mmHg . Current regimen:  o Hydrochlorothiazide 25 mg - 1 tablet daily . Interventions: o Recommend checking blood pressure . Patient self care activities - Over the next 2 weeks, patient will: o Check blood pressure three days per week, document, and provide at future appointment - Pharmacist will follow up in 2 weeks  o Ensure daily salt intake < 2300 mg/day  Peripheral Artery Disease . Pharmacist Clinical Goal(s): o Over the next 6 months, patient will work with PharmD and providers to reduce cardiovascular risk . Current regimen:  o No pharmacotherapy . Interventions: o Recommend aspirin 81 mg daily . Patient self care activities - Over the next 6 months, patient will: o Restart aspirin 81 mg daily  o Call if any abnormal bruising or bleeding  Additional Recommendations: . Avoid additional non-steroidal anti-inflammatories for pain (Goody's powder, Advil (ibuprofen), Aleve (naproxen), Excedrin products) due to increasing risks with daily meloxicam. Use Tylenol or topical products for additional pain relief.  . Recommend updating vaccines: Tetanus (Tdap) and 2-dose series of shingles vaccines (Shingrix) recommended; Both can be given at your local pharmacy. . Incorporate more lean meats, fruits,  and vegetables into diet. . Consider trying a maintenance inhaler for COPD in additional to your rescue inhaler (albuterol).   Initial goal documentation      Caleb Bryant was given information about Chronic Care Management services today including:  1. CCM service includes personalized support from designated clinical staff supervised by his physician, including individualized plan of care and coordination with other care providers 2. 24/7 contact phone numbers for assistance for urgent and routine care needs. 3. Standard insurance, coinsurance, copays and deductibles apply for chronic care management only during months in which we provide at least 20 minutes of these services. Most insurances cover these services at 100%, however patients may be responsible for any copay, coinsurance and/or deductible if applicable. This service may help you avoid the need for more expensive face-to-face services. 4. Only one practitioner may furnish and bill the service in a calendar month. 5. The patient may stop CCM services at any time (effective at the end of the month) by phone call to the office staff.  Patient agreed to services and verbal consent obtained.   The patient verbalized understanding of instructions provided today and agreed to receive a mailed copy of patient instruction and/or educational materials. Telephone follow up appointment with pharmacy team member scheduled for: 03/30/20 at 8:30 AM (telephone) for blood pressure log   Debbora Dus, PharmD Clinical Pharmacist [facility] at [facility] [phone]   Aspirin and Your Heart  Aspirin is a medicine that prevents the cells in the blood that are used for clotting, called platelets, from sticking together. Aspirin can  be used to help reduce the risk of blood clots, heart attacks, and other heart-related problems. Can I take aspirin? Your health care provider will help you determine whether it is safe and beneficial for  you to take aspirin daily. Taking aspirin daily may be helpful if you:  Have had a heart attack or chest pain.  Are at risk for a heart attack.  Have undergone open-heart surgery, such as coronary artery bypass surgery (CABG).  Have had coronary angioplasty or a stent.  Have had certain types of stroke or transient ischemic attack (TIA).  Have peripheral artery disease (PAD).  Have chronic heart rhythm problems such as atrial fibrillation and cannot take an anticoagulant.  Have valve disease or have had surgery on a valve. What are the risks? Daily use of aspirin can cause side effects. Some of these include:  Bleeding. Bleeding problems can be minor or serious. An example of a minor problem is a cut that does not stop bleeding. An example of a more serious problem is stomach bleeding or, rarely, bleeding into the brain. Your risk of bleeding is increased if you are also taking non-steroidal anti-inflammatory drugs (NSAIDs).  Increased bruising.  Upset stomach.  An allergic reaction. People who have nasal polyps have an increased risk of developing an aspirin allergy. General guidelines  Take aspirin only as told by your health care provider. Make sure that you understand how much you should take and what form you should take. The two forms of aspirin are: ? Non-enteric-coated.This type of aspirin does not have a coating and is absorbed quickly. This type of aspirin also comes in a chewable form. ? Enteric-coated. This type of aspirin has a coating that releases the medicine very slowly. Enteric-coated aspirin might cause less stomach upset than non-enteric-coated aspirin. This type of aspirin should not be chewed or crushed.  Limit alcohol intake to no more than 1 drink a day for nonpregnant women and 2 drinks a day for men. Drinking alcohol increases your risk of bleeding. One drink equals 12 oz of beer, 5 oz of wine, or 1 oz of hard liquor. Contact a health care provider if  you:  Have unusual bleeding or bruising.  Have stomach pain or nausea.  Have ringing in your ears.  Have an allergic reaction that causes: ? Hives. ? Itchy skin. ? Swelling of the lips, tongue, or face. Get help right away if you:  Notice that your bowel movements are bloody, dark red, or black in color.  Vomit or cough up blood.  Have blood in your urine.  Cough, have noisy breathing (wheeze), or feel short of breath.  Have chest pain, especially if the pain spreads to the arms, back, neck, or jaw.  Have a severe headache, or a headache with confusion, or dizziness. These symptoms may represent a serious problem that is an emergency. Do not wait to see if the symptoms will go away. Get medical help right away. Call your local emergency services (911 in the [place] Do not drive yourself to the hospital. Summary  Aspirin can be used to help reduce the risk of blood clots, heart attacks, and other heart-related problems.  Daily use of aspirin can increase your risk of side effects. Your health care provider will help you determine whether it is safe and beneficial for you to take aspirin daily.  Take aspirin only as told by your health care provider. Make sure that you understand how much you can take and what form  you can take. This information is not intended to replace advice given to you by your health care provider. Make sure you discuss any questions you have with your health care provider. Document Revised: 08/16/2017 Document Reviewed: 08/16/2017 Elsevier Patient Education  2020 [employer].

## 2020-03-30 ENCOUNTER — Telehealth: Payer: PPO

## 2020-03-31 NOTE — Chronic Care Management (AMB) (Unsigned)
Chronic Care Management Pharmacy  Name: Caleb Bryant [facility] Sr.  MRN: [id number] DOB: 1934-01-07  Chief Complaint/ HPI  Caleb Bryant Sr.,  86 y.o., male presents for their Initial CCM visit with the clinical pharmacist via telephone. Patient requested visit be completed with his wife, Caleb Bryant.   PCP : [name], MD  Their chronic conditions include: hypertension, PVD, diabetes, chronic systolic heart failure, allergic rhinitis, diverticulosis, GERD, neuropathy, CKD stage 3, mild dementia, osteoarthritis, gout, DDD, BPH, vitamin B12 deficiency, hypercholesterolemia  Patient concerns: still having some shortness of breath, pt decided to cancel echo and stress test with cardiology as he believes his dyspnea is primarily related to COPD  Office Visits:  01/01/20: Diona Browner - neuropathy, Lyrica increase in February 2021, noticed improvement, denies gout flares, SOB, declines further eval  12/05/19: [facility] - try higher dose of Lyrica  Consult Visit:  02/05/20: Cardiology - Aflutter s/p ablation 2015, worsening dyspnea with yard work over the past year, history of working in [employer], prior smoker, pulmonary fibrosis, order echo, rtc 1 month  Allergies  Allergen Reactions  . Codeine Nausea And Vomiting   Medications: Outpatient Encounter Medications as of 03/30/2020  Medication Sig  . allopurinol (ZYLOPRIM) 100 MG tablet TAKE 1 TABLET(100 MG) BY MOUTH DAILY  . atorvastatin (LIPITOR) 20 MG tablet Take one table by mouth daily  . Cyanocobalamin (B-12 PO) Take 1,000 mcg by mouth daily.   . fluorometholone (FML) 0.1 % ophthalmic suspension 1 drop 2 (two) times daily.  . hydrochlorothiazide (HYDRODIURIL) 25 MG tablet Take one tablet by mouth daily  . pregabalin (LYRICA) 50 MG capsule Take 1 capsule (50 mg total) by mouth 2 (two) times daily.   No facility-administered encounter medications on file as of 03/30/2020.   Current Diagnosis/Assessment:   Emergency planning/management officer Strain: Low  Risk   . Difficulty of Paying Living Expenses: Not hard at all   Goals Addressed   None    Hypertension   CMP Latest Ref Rng & Units 12/05/2019 11/22/2018 04/15/2018  Glucose 70 - 99 mg/dL 111(H) 110(H) 113(H)  BUN 6 - 23 mg/dL _0 Creatinine 0.40 - 1.50 mg/dL 1.28 1.17 1.12  Sodium 135 - 145 mEq/L 138 138 140  Potassium 3.5 - 5.1 mEq/L 4.3 4.3 4.5  Chloride 96 - 112 mEq/L 95(L) 99 98  CO2 19 - 32 mEq/L 33(H) 30 33(H)  Calcium 8.4 - 10.5 mg/dL 10.1 9.7 9.9  Total Protein 6.0 - 8.3 g/dL 7.5 7.5 7.2  Total Bilirubin 0.2 - 1.2 mg/dL 0.9 0.8 0.7  Alkaline Phos 39 - 117 U/L 78 63 64  AST 0 - 37 U/L _1 ALT 0 - 53 U/L _2 Office blood pressures are: BP Readings from Last 3 Encounters:  02/05/20 (!) 160/100  01/01/20 128/82  12/05/19 118/76   BP goal < 140/90 mmHg Patient has failed these meds in the past: none  Patient checks BP at home several times per month Patient home BP readings are ranging: 150s/90s  Patient is currently uncontrolled on the following medications:   Hydrochlorothiazide 25 mg - 1 tablet daily  We discussed: recommend checking BP more frequently   Plan: Continue current medications; Recommend checking BP 3 days per week for the next 2 weeks. Follow up call to assess.   2 weeks 169/100, 65 158/100, 74 - took medicine 150/83, 62 After meds 147/76, 55 - same day,143/88, 62 Sat - 155/92, 60 - 152/89, 62,  151/89, 64 Sun - 150/93, 65, 154/85, 73 Wed this week - 138/82, 66, 143/78, 63 (Later in the day - after BF) 137/86, 80, 131/86, 89  142/89, 63, 144/79, 58  COPD   Spirometry: 12/2015 FEV1 62% Former smoker Patient has failed these meds in past: Spiriva 2017 - too expensive  Patient is currently controlled on the following medications:   Albuterol 90 mcg - 2 puffs every 4-6 hours PRN  We discussed: Suggested retrial of maintenance inhaler due to frequent SOB and rescue inhaler use, due to cost concerns, pt would like to continue  current therapy; Reports seeing a NP from [facility] in the home several weeks ago who prescribed albuterol and was able to provided at low cost ($30 for 3 inhalers). Reports NP visits the home monthly and they have 24/7 telephone access.   Exercise: Mows lawns/weedeating, can only weed-eat for 15 min intervals due to SOB  Plan: Continue current medications; Recommend maintenance inhaler for COPD if affordable.   Hyperlipidemia/PAD   Lipid Panel     Component Value Date/Time   CHOL 119 12/05/2019 1006   TRIG 55.0 12/05/2019 1006   HDL 60.60 12/05/2019 1006   CHOLHDL 2 12/05/2019 1006   VLDL 11.0 12/05/2019 1006   LDLCALC 48 12/05/2019 1006    CBC Latest Ref Rng & Units 12/05/2019 08/11/2019 10/27/2014  WBC 4.0 - 10.5 K/uL 13.7(H) 10.5 9.7  Hemoglobin 13.0 - 17.0 g/dL 16.5 15.4 16.5  Hematocrit 39.0 - 52.0 % 50.0 47.0 51.3  Platelets 150.0 - 400.0 K/uL 262.0 240 183   LDL goal < 70 Patient has failed these meds in past: none reported Patient is currently controlled on the following medications:   Atorvastatin 20 mg - 1 tablet daily  We discussed:  Recommended starting aspirin 81 mg due to PAD, per chart review, patient previously on aspirin and no clear reason noted for discontinuation   Plan: Continue current medications; Recommend resuming aspirin 81 mg daily due to PAD. - resumed*  Diabetes   Recent Relevant Labs: Lab Results  Component Value Date/Time   HGBA1C 6.7 (H) 12/05/2019 10:06 AM   HGBA1C 6.6 (H) 11/22/2018 08:29 AM   MICROALBUR 2.4 (H) 11/22/2018 08:29 AM   MICROALBUR 1.3 11/15/2017 08:33 AM    Checking BG: Never, does not have glucometer  Patient has failed these meds in past: none Patient is currently controlled on the following medications:   No pharmacotherapy  Last diabetic eye exam:  Lab Results  Component Value Date/Time   HMDIABEYEEXA No Retinopathy 07/15/2015 12:00 AM    Last diabetic foot exam:  Lab Results  Component Value Date/Time    HMDIABFOOTEX done 12/05/2019 12:00 AM    We discussed: reports poor diet, minimal exercise - red meats, fried chicken, sweet tea, pepsi (has cut back some to < 1/day), Gatorade without sugar, does not drink water; freedom with diet is important to patient   Plan: Continue current medications; Recommend incorporating more lean meats, fruits, and vegetables into diet.  Heart Failure   Type: Systolic   Last ejection fraction: August 2016 - LVEF normal (10/2013 - LVEF 25-30%) NYHA Class: II (slight limitation of activity) AHA HF Stage: C (Heart disease and symptoms present)  Patient has failed these meds in past: none  Patient is currently controlled on the following medications:   No pharmacotherapy  We discussed: believes SOB is primarily due to COPD, declined further cardiac evaluation 01/2020   Plan: Continue current medications   Gout   Uric  Acid 11/03/2013 2.6 Patient has failed these meds in past: none reported Patient is currently controlled on the following medications:   Allopurinol 100 mg - 1 tablet daily  We discussed: denies recent gout flares  Plan: Continue current medications   Neuropathy   Patient has failed these meds in past: gabapentin  Patient is currently controlled on the following medications:   Pregabalin (Lyrica) 50 mg - 1 capsule BID  Meloxicam 7.5 mg - 1 tablet daily (osteoarthritis)  We discussed: reports well controlled, pt takes Advil PRN headache or back pain and occasional BC for headaches, counseled on avoiding additional NSAIDs and taking Tylenol instead  Plan: Continue current medications; Avoid OTC NSAIDs.  Vaccines   Reviewed and discussed patient's vaccination history.    Immunization History  Administered Date(s) Administered  . Influenza Split 07/31/2011, 08/08/2012, 09/01/2013  . Influenza Whole 10/05/2004, 08/16/2007, 08/11/2008, 08/06/2009  . Influenza, High Dose Seasonal PF 07/27/2016, 08/09/2017, 08/01/2018, 07/25/2019    . Influenza,inj,Quad PF,6+ Mos 06/22/2014, 10/14/2015  . Influenza-Unspecified 08/30/2018  . Pneumococcal Conjugate-13 06/22/2014  . Pneumococcal Polysaccharide-23 04/14/2009  . Td 10/31/1997, 05/13/2010, 12/05/2019   Plan: Recommended patient receive Tdap, Shingrix   Medication Management  Misc: FML 0.1% ophthalmic suspension - 1 drop in each eye BID for dry eyes   OTCs: Vitamin B12 1000 mcg - daily, vitamin C - daily, Alive multivitamin,   Pharmacy/Benefits: [facility]  Adherence: no concerns, refills timely   Social support: long-term church friends, family  Affordability: current medications are affordable   CCM Follow Up: 03/30/20 at 8:30 AM (telephone) for BP log   Debbora Dus, PharmD Clinical Pharmacist [facility] at [facility] [phone]

## 2020-04-01 ENCOUNTER — Ambulatory Visit: Payer: PPO

## 2020-04-01 ENCOUNTER — Other Ambulatory Visit: Payer: Self-pay

## 2020-04-01 DIAGNOSIS — I10 Essential (primary) hypertension: Secondary | ICD-10-CM

## 2020-04-01 DIAGNOSIS — E7800 Pure hypercholesterolemia, unspecified: Secondary | ICD-10-CM

## 2020-04-01 NOTE — Chronic Care Management (AMB) (Signed)
Chronic Care Management Pharmacy  Name: Caleb Bryant [facility] Sr.  MRN: [id number] DOB: 1934-08-18  Chief Complaint/ HPI  Caleb Bryant Sr.,  86 y.o., male presents for their Follow-Up CCM visit with the clinical pharmacist via telephone. Completed with his wife, Caleb Bryant.   PCP : [name], MD  Their chronic conditions include: hypertension, PVD, diabetes, chronic systolic heart failure, allergic rhinitis, diverticulosis, GERD, neuropathy, CKD stage 3, mild dementia, osteoarthritis, gout, DDD, BPH, vitamin B12 deficiency, hypercholesterolemia  No office visits since last CCM visit 03/15/20  Allergies  Allergen Reactions  . Codeine Nausea And Vomiting   Medications: Outpatient Encounter Medications as of 04/01/2020  Medication Sig  . allopurinol (ZYLOPRIM) 100 MG tablet TAKE 1 TABLET(100 MG) BY MOUTH DAILY  . atorvastatin (LIPITOR) 20 MG tablet Take one table by mouth daily  . Cyanocobalamin (B-12 PO) Take 1,000 mcg by mouth daily.   . fluorometholone (FML) 0.1 % ophthalmic suspension 1 drop 2 (two) times daily.  . hydrochlorothiazide (HYDRODIURIL) 25 MG tablet Take one tablet by mouth daily  . pregabalin (LYRICA) 50 MG capsule Take 1 capsule (50 mg total) by mouth 2 (two) times daily.   No facility-administered encounter medications on file as of 04/01/2020.   Current Diagnosis/Assessment:   Emergency planning/management officer Strain: Low Risk   . Difficulty of Paying Living Expenses: Not hard at all   Goals Addressed            This Visit's Progress   . Pharmacy Care Plan       CARE PLAN ENTRY  Current Barriers:  . Chronic Disease Management support, education, and care coordination needs related to Hypertension and COPD   Hypertension . Pharmacist Clinical Goal(s): o Over the next 4 weeks, patient will work with PharmD and providers to achieve BP goal <140/90 mmHg . Current regimen:  o Hydrochlorothiazide 25 mg - 1 tablet daily . Interventions: o Reviewed home blood  pressure log and average home BP was 141/87 mmHg with home BP goal < 135/85 mmHg. Due to variance in timing of day, would like patient to check for 7 days prior to next telephone visit.  . Patient self care activities - Over the next 4 weeks, patient will: o Monitor home blood pressure for 7 days before next visit. Check twice in the morning before breakfast and twice in the evening before bedtime with 2-5 minutes in between checks.   Additional Recommendations: . Avoid additional non-steroidal anti-inflammatories for pain (Goody's powder, Advil (ibuprofen), Aleve (naproxen), Excedrin products) due to increasing risks with daily meloxicam. Use Tylenol or topical products for additional pain relief.  . Recommend updating vaccines: Tetanus (Tdap) and 2-dose series of shingles vaccines (Shingrix) recommended; Both can be given at your local pharmacy. . Incorporate more lean meats, fruits, and vegetables into diet. . Consider trying a maintenance inhaler for COPD in additional to your rescue inhaler (albuterol).   Please see past updates related to this goal by clicking on the "Past Updates" button in the selected goal       Hypertension   CMP Latest Ref Rng & Units 12/05/2019 11/22/2018 04/15/2018  Glucose 70 - 99 mg/dL 111(H) 110(H) 113(H)  BUN 6 - 23 mg/dL _0 Creatinine 0.40 - 1.50 mg/dL 1.28 1.17 1.12  Sodium 135 - 145 mEq/L 138 138 140  Potassium 3.5 - 5.1 mEq/L 4.3 4.3 4.5  Chloride 96 - 112 mEq/L 95(L) 99 98  CO2 19 - 32 mEq/L 33(H) 30 33(H)  Calcium  8.4 - 10.5 mg/dL 10.1 9.7 9.9  Total Protein 6.0 - 8.3 g/dL 7.5 7.5 7.2  Total Bilirubin 0.2 - 1.2 mg/dL 0.9 0.8 0.7  Alkaline Phos 39 - 117 U/L 78 63 64  AST 0 - 37 U/L _0 ALT 0 - 53 U/L _1 Office blood pressures are: BP Readings from Last 3 Encounters:  02/05/20 (!) 160/100  01/01/20 128/82  12/05/19 118/76   BP goal < 140/90 mmHg Patient has failed these meds in the past: none  Patient checks BP at home: 2-3  days per week  Patient home BP readings are ranging: (last 2 weeks) Before breakfast   169/100, 65    158/100, 74; 150/83, 62    147/76, 55; 143/88, 62    155/92, 60; 152/89, 62; 151/89, 64    150/93, 65, 154/85, 73   Afternoon:  138/82, 66; 143/78, 63   137/86, 80; 131/86, 89   142/89, 63; 144/79, 58  Average blood pressure: 141/87 mmHg  Patient is currently uncontrolled on the following medications:   Hydrochlorothiazide 25 mg - 1 tablet daily  We discussed: average home blood pressure goal < 135/85  Plan: Continue current medications; Monitor home blood pressure for 7 days before next visit. Check twice in the morning before breakfast and twice in the evening before bedtime with 2-5 minutes in between checks.   Hyperlipidemia/PAD   Lipid Panel     Component Value Date/Time   CHOL 119 12/05/2019 1006   TRIG 55.0 12/05/2019 1006   HDL 60.60 12/05/2019 1006   CHOLHDL 2 12/05/2019 1006   VLDL 11.0 12/05/2019 1006   LDLCALC 48 12/05/2019 1006    CBC Latest Ref Rng & Units 12/05/2019 08/11/2019 10/27/2014  WBC 4.0 - 10.5 K/uL 13.7(H) 10.5 9.7  Hemoglobin 13.0 - 17.0 g/dL 16.5 15.4 16.5  Hematocrit 39 - 52 % 50.0 47.0 51.3  Platelets 150 - 400 K/uL 262.0 240 183   LDL goal < 70 Patient has failed these meds in past: none reported Patient is currently controlled on the following medications:   Atorvastatin 20 mg - 1 tablet daily  Aspirin 81 mg - 1 tablet daily  We discussed: patient resumed aspirin since last visit, denies concerns  Plan: Continue current medications  CCM Follow Up: 05/07/20 at 9:30 AM (telephone) for BP log   Debbora Dus, PharmD Clinical Pharmacist [facility] at [facility] [phone]

## 2020-04-14 DIAGNOSIS — M47896 Other spondylosis, lumbar region: Secondary | ICD-10-CM | POA: Diagnosis not present

## 2020-04-14 DIAGNOSIS — M545 Low back pain: Secondary | ICD-10-CM | POA: Diagnosis not present

## 2020-04-17 NOTE — Patient Instructions (Signed)
Dear Caleb Nevin Ohern Sr.,  Below is a summary of the goals we discussed during our follow up appointment on April 01, 2020. Please contact me anytime with questions or concerns.   Visit Information  Goals Addressed            This Visit's Progress   . Pharmacy Care Plan       CARE PLAN ENTRY  Current Barriers:  . Chronic Disease Management support, education, and care coordination needs related to Hypertension and COPD   Hypertension . Pharmacist Clinical Goal(s): o Over the next 4 weeks, patient will work with PharmD and providers to achieve BP goal <140/90 mmHg . Current regimen:  o Hydrochlorothiazide 25 mg - 1 tablet daily . Interventions: o Reviewed home blood pressure log and average home BP was 141/87 mmHg with home BP goal < 135/85 mmHg. Due to variance in timing of day, would like patient to check for 7 days prior to next telephone visit.  . Patient self care activities - Over the next 4 weeks, patient will: o Monitor home blood pressure for 7 days before next visit. Check twice in the morning before breakfast and twice in the evening before bedtime with 2-5 minutes in between checks.   Additional Recommendations: . Avoid additional non-steroidal anti-inflammatories for pain (Goody's powder, Advil (ibuprofen), Aleve (naproxen), Excedrin products) due to increasing risks with daily meloxicam. Use Tylenol or topical products for additional pain relief.  . Recommend updating vaccines: Tetanus (Tdap) and 2-dose series of shingles vaccines (Shingrix) recommended; Both can be given at your local pharmacy. . Incorporate more lean meats, fruits, and vegetables into diet. . Consider trying a maintenance inhaler for COPD in additional to your rescue inhaler (albuterol).   Please see past updates related to this goal by clicking on the "Past Updates" button in the selected goal        The patient verbalized understanding of instructions provided today and agreed to receive a  mailed copy of patient instruction and/or educational materials.  Telephone follow up appointment with pharmacy team member scheduled for: 05/07/20 at 9:30 AM (telephone) for BP log   Debbora Dus, PharmD Clinical Pharmacist [facility] at [facility] [phone]   How to Take Your Blood Pressure You can take your blood pressure at home with a machine. You may need to check your blood pressure at home:  To check if you have high blood pressure (hypertension).  To check your blood pressure over time.  To make sure your blood pressure medicine is working. Supplies needed: You will need a blood pressure machine, or monitor. You can buy one at a drugstore or online. When choosing one:  Choose one with an arm cuff.  Choose one that wraps around your upper arm. Only one finger should fit between your arm and the cuff.  Do not choose one that measures your blood pressure from your wrist or finger. Your doctor can suggest a monitor. How to prepare Avoid these things for 30 minutes before checking your blood pressure:  Drinking caffeine.  Drinking alcohol.  Eating.  Smoking.  Exercising. Five minutes before checking your blood pressure:  Pee.  Sit in a dining chair. Avoid sitting in a soft couch or armchair.  Be quiet. Do not talk. How to take your blood pressure Follow the instructions that came with your machine. If you have a digital blood pressure monitor, these may be the instructions: 1. Sit up straight. 2. Place your feet on the floor. Do  not cross your ankles or legs. 3. Rest your left arm at the level of your heart. You may rest it on a table, desk, or chair. 4. Pull up your shirt sleeve. 5. Wrap the blood pressure cuff around the upper part of your left arm. The cuff should be 1 inch (2.5 cm) above your elbow. It is best to wrap the cuff around bare skin. 6. Fit the cuff snugly around your arm. You should be able to place only one finger between the  cuff and your arm. 7. Put the cord inside the groove of your elbow. 8. Press the power button. 9. Sit quietly while the cuff fills with air and loses air. 10. Write down the numbers on the screen. 11. Wait 2-3 minutes and then repeat steps 1-10. What do the numbers mean? Two numbers make up your blood pressure. The first number is called systolic pressure. The second is called diastolic pressure. An example of a blood pressure reading is "120 over 80" (or 120/80). If you are an adult and do not have a medical condition, use this guide to find out if your blood pressure is normal: Normal  First number: below 120.  Second number: below 80. Elevated  First number: 120-129.  Second number: below 80. Hypertension stage 1  First number: 130-139.  Second number: 80-89. Hypertension stage 2  First number: 140 or above.  Second number: 57 or above. Your blood pressure is above normal even if only the top or bottom number is above normal. Follow these instructions at home:  Check your blood pressure as often as your doctor tells you to.  Take your monitor to your next doctor's appointment. Your doctor will: ? Make sure you are using it correctly. ? Make sure it is working right.  Make sure you understand what your blood pressure numbers should be.  Tell your doctor if your medicines are causing side effects. Contact a doctor if:  Your blood pressure keeps being high. Get help right away if:  Your first blood pressure number is higher than 180.  Your second blood pressure number is higher than 120. This information is not intended to replace advice given to you by your health care provider. Make sure you discuss any questions you have with your health care provider. Document Revised: 09/28/2017 Document Reviewed: 03/24/2016 Elsevier Patient Education  2020 [employer].

## 2020-04-19 NOTE — Progress Notes (Signed)
I have collaborated with the care management provider regarding care management and care coordination activities outlined in this encounter and have reviewed this encounter including documentation in the note and care plan. I am certifying that I agree with the content of this note and encounter as supervising physician.   Signed,  Maud Deed. Shaketta Rill, MD

## 2020-05-07 ENCOUNTER — Telehealth: Payer: Self-pay

## 2020-05-07 ENCOUNTER — Telehealth: Payer: PPO

## 2020-05-07 NOTE — Telephone Encounter (Signed)
Attempted to reach patient for CCM follow up appt 05/07/20 at 9:30 AM. Left voicemail with contact information.  Patient's wife called back at noon. No updated BP readings. Will check twice daily for next 7 days and call with update next Friday 05/14/20.  Debbora Dus, PharmD Clinical Pharmacist [facility] at [facility] [phone]

## 2020-05-07 NOTE — Chronic Care Management (AMB) (Deleted)
Chronic Care Management Pharmacy  Name: Caleb Sevin [facility] Sr.  MRN: [id number] DOB: [birth year]  Chief Complaint/ HPI  Caleb Nevin Fanelli Sr.,  86 y.o., male presents for their Follow-Up CCM visit with the clinical pharmacist via telephone. Completed with his wife, Caleb Bryant.   PCP : [name], MD  Their chronic conditions include: hypertension, PVD, diabetes, chronic systolic heart failure, allergic rhinitis, diverticulosis, GERD, neuropathy, CKD stage 3, mild dementia, osteoarthritis, gout, DDD, BPH, vitamin B12 deficiency, hypercholesterolemia  No office visits since last CCM visit 04/01/20  Allergies  Allergen Reactions  . Codeine Nausea And Vomiting   Medications: Outpatient Encounter Medications as of 05/07/2020  Medication Sig  . allopurinol (ZYLOPRIM) 100 MG tablet TAKE 1 TABLET(100 MG) BY MOUTH DAILY  . atorvastatin (LIPITOR) 20 MG tablet Take one table by mouth daily  . Cyanocobalamin (B-12 PO) Take 1,000 mcg by mouth daily.   . fluorometholone (FML) 0.1 % ophthalmic suspension 1 drop 2 (two) times daily.  . hydrochlorothiazide (HYDRODIURIL) 25 MG tablet Take one tablet by mouth daily  . pregabalin (LYRICA) 50 MG capsule Take 1 capsule (50 mg total) by mouth 2 (two) times daily.   No facility-administered encounter medications on file as of 05/07/2020.   Current Diagnosis/Assessment:   Emergency planning/management officer Strain: Low Risk   . Difficulty of Paying Living Expenses: Not hard at all   Goals Addressed   None    Hypertension   CMP Latest Ref Rng & Units 12/05/2019 11/22/2018 04/15/2018  Glucose 70 - 99 mg/dL 111(H) 110(H) 113(H)  BUN 6 - 23 mg/dL _0 Creatinine 0.40 - 1.50 mg/dL 1.28 1.17 1.12  Sodium 135 - 145 mEq/L 138 138 140  Potassium 3.5 - 5.1 mEq/L 4.3 4.3 4.5  Chloride 96 - 112 mEq/L 95(L) 99 98  CO2 19 - 32 mEq/L 33(H) 30 33(H)  Calcium 8.4 - 10.5 mg/dL 10.1 9.7 9.9  Total Protein 6.0 - 8.3 g/dL 7.5 7.5 7.2  Total Bilirubin 0.2 - 1.2 mg/dL 0.9 0.8 0.7   Alkaline Phos 39 - 117 U/L 78 63 64  AST 0 - 37 U/L _1 ALT 0 - 53 U/L _2 Office blood pressures are: BP Readings from Last 3 Encounters:  02/05/20 (!) 160/100  01/01/20 128/82  12/05/19 118/76   BP goal < 140/90 mmHg Patient has failed these meds in the past: none  Patient checks BP at home: 2-3 days per week  Patient home BP readings are ranging: (last 2 weeks) Before breakfast   169/100, 65    158/100, 74; 150/83, 62    147/76, 55; 143/88, 62    155/92, 60; 152/89, 62; 151/89, 64    150/93, 65, 154/85, 73   Afternoon:  138/82, 66; 143/78, 63   137/86, 80; 131/86, 89   142/89, 63; 144/79, 58  Average blood pressure: 141/87 mmHg  Patient is currently uncontrolled on the following medications:   Hydrochlorothiazide 25 mg - 1 tablet daily  We discussed: average home blood pressure goal < 135/85  Plan: Continue current medications; Monitor home blood pressure for 7 days before next visit. Check twice in the morning before breakfast and twice in the evening before bedtime with 2-5 minutes in between checks.   Hyperlipidemia/PAD   Lipid Panel     Component Value Date/Time   CHOL 119 12/05/2019 1006   TRIG 55.0 12/05/2019 1006   HDL 60.60 12/05/2019 1006   CHOLHDL 2 12/05/2019 1006  VLDL 11.0 12/05/2019 1006   LDLCALC 48 12/05/2019 1006    CBC Latest Ref Rng & Units 12/05/2019 08/11/2019 10/27/2014  WBC 4.0 - 10.5 K/uL 13.7(H) 10.5 9.7  Hemoglobin 13.0 - 17.0 g/dL 16.5 15.4 16.5  Hematocrit 39 - 52 % 50.0 47.0 51.3  Platelets 150 - 400 K/uL 262.0 240 183   LDL goal < 70 Patient has failed these meds in past: none reported Patient is currently controlled on the following medications:   Atorvastatin 20 mg - 1 tablet daily  Aspirin 81 mg - 1 tablet daily  We discussed: patient resumed aspirin since last visit, denies concerns  Plan: Continue current medications  CCM Follow Up: 05/07/20 at 9:30 AM (telephone) for BP log   Debbora Dus,  PharmD Clinical Pharmacist [facility] at [facility] [phone]

## 2020-07-12 DIAGNOSIS — L570 Actinic keratosis: Secondary | ICD-10-CM | POA: Diagnosis not present

## 2020-07-12 DIAGNOSIS — L814 Other melanin hyperpigmentation: Secondary | ICD-10-CM | POA: Diagnosis not present

## 2020-07-12 DIAGNOSIS — D492 Neoplasm of unspecified behavior of bone, soft tissue, and skin: Secondary | ICD-10-CM | POA: Diagnosis not present

## 2020-07-12 DIAGNOSIS — L821 Other seborrheic keratosis: Secondary | ICD-10-CM | POA: Diagnosis not present

## 2020-07-12 DIAGNOSIS — L738 Other specified follicular disorders: Secondary | ICD-10-CM | POA: Diagnosis not present

## 2020-07-12 DIAGNOSIS — D229 Melanocytic nevi, unspecified: Secondary | ICD-10-CM | POA: Diagnosis not present

## 2020-07-13 DIAGNOSIS — M545 Low back pain: Secondary | ICD-10-CM | POA: Diagnosis not present

## 2020-07-29 ENCOUNTER — Emergency Department: Payer: PPO

## 2020-07-29 ENCOUNTER — Ambulatory Visit
Admission: EM | Admit: 2020-07-29 | Discharge: 2020-07-29 | Disposition: A | Discharge status: Home / Self Care | Payer: PPO

## 2020-07-29 ENCOUNTER — Other Ambulatory Visit: Payer: Self-pay

## 2020-07-29 DIAGNOSIS — Z7951 Long term (current) use of inhaled steroids: Secondary | ICD-10-CM | POA: Insufficient documentation

## 2020-07-29 DIAGNOSIS — Z947 Corneal transplant status: Secondary | ICD-10-CM | POA: Diagnosis not present

## 2020-07-29 DIAGNOSIS — E1142 Type 2 diabetes mellitus with diabetic polyneuropathy: Secondary | ICD-10-CM | POA: Diagnosis present

## 2020-07-29 DIAGNOSIS — R52 Pain, unspecified: Secondary | ICD-10-CM | POA: Diagnosis not present

## 2020-07-29 DIAGNOSIS — I1310 Hypertensive heart and chronic kidney disease without heart failure, with stage 1 through stage 4 chronic kidney disease, or unspecified chronic kidney disease: Secondary | ICD-10-CM | POA: Diagnosis present

## 2020-07-29 DIAGNOSIS — I10 Essential (primary) hypertension: Secondary | ICD-10-CM | POA: Diagnosis not present

## 2020-07-29 DIAGNOSIS — E1151 Type 2 diabetes mellitus with diabetic peripheral angiopathy without gangrene: Secondary | ICD-10-CM | POA: Diagnosis present

## 2020-07-29 DIAGNOSIS — R079 Chest pain, unspecified: Secondary | ICD-10-CM | POA: Diagnosis not present

## 2020-07-29 DIAGNOSIS — E119 Type 2 diabetes mellitus without complications: Secondary | ICD-10-CM | POA: Insufficient documentation

## 2020-07-29 DIAGNOSIS — R069 Unspecified abnormalities of breathing: Secondary | ICD-10-CM | POA: Diagnosis not present

## 2020-07-29 DIAGNOSIS — R0902 Hypoxemia: Secondary | ICD-10-CM | POA: Diagnosis not present

## 2020-07-29 DIAGNOSIS — E1122 Type 2 diabetes mellitus with diabetic chronic kidney disease: Secondary | ICD-10-CM | POA: Diagnosis present

## 2020-07-29 DIAGNOSIS — J440 Chronic obstructive pulmonary disease with acute lower respiratory infection: Secondary | ICD-10-CM | POA: Diagnosis present

## 2020-07-29 DIAGNOSIS — Z8249 Family history of ischemic heart disease and other diseases of the circulatory system: Secondary | ICD-10-CM | POA: Diagnosis not present

## 2020-07-29 DIAGNOSIS — E1169 Type 2 diabetes mellitus with other specified complication: Secondary | ICD-10-CM | POA: Diagnosis present

## 2020-07-29 DIAGNOSIS — N1831 Chronic kidney disease, stage 3a: Secondary | ICD-10-CM | POA: Diagnosis present

## 2020-07-29 DIAGNOSIS — Z79899 Other long term (current) drug therapy: Secondary | ICD-10-CM | POA: Diagnosis not present

## 2020-07-29 DIAGNOSIS — N400 Enlarged prostate without lower urinary tract symptoms: Secondary | ICD-10-CM | POA: Diagnosis present

## 2020-07-29 DIAGNOSIS — U071 COVID-19: Secondary | ICD-10-CM

## 2020-07-29 DIAGNOSIS — N183 Chronic kidney disease, stage 3 unspecified: Secondary | ICD-10-CM | POA: Insufficient documentation

## 2020-07-29 DIAGNOSIS — E860 Dehydration: Secondary | ICD-10-CM | POA: Diagnosis present

## 2020-07-29 DIAGNOSIS — I5022 Chronic systolic (congestive) heart failure: Secondary | ICD-10-CM | POA: Diagnosis present

## 2020-07-29 DIAGNOSIS — J449 Chronic obstructive pulmonary disease, unspecified: Secondary | ICD-10-CM | POA: Diagnosis not present

## 2020-07-29 DIAGNOSIS — R1084 Generalized abdominal pain: Secondary | ICD-10-CM | POA: Diagnosis not present

## 2020-07-29 DIAGNOSIS — Z885 Allergy status to narcotic agent status: Secondary | ICD-10-CM | POA: Diagnosis not present

## 2020-07-29 DIAGNOSIS — J9601 Acute respiratory failure with hypoxia: Secondary | ICD-10-CM | POA: Diagnosis present

## 2020-07-29 DIAGNOSIS — R05 Cough: Secondary | ICD-10-CM | POA: Diagnosis not present

## 2020-07-29 DIAGNOSIS — E785 Hyperlipidemia, unspecified: Secondary | ICD-10-CM | POA: Diagnosis present

## 2020-07-29 DIAGNOSIS — Y929 Unspecified place or not applicable: Secondary | ICD-10-CM | POA: Diagnosis not present

## 2020-07-29 DIAGNOSIS — E538 Deficiency of other specified B group vitamins: Secondary | ICD-10-CM | POA: Diagnosis present

## 2020-07-29 DIAGNOSIS — I130 Hypertensive heart and chronic kidney disease with heart failure and stage 1 through stage 4 chronic kidney disease, or unspecified chronic kidney disease: Secondary | ICD-10-CM | POA: Insufficient documentation

## 2020-07-29 DIAGNOSIS — E878 Other disorders of electrolyte and fluid balance, not elsewhere classified: Secondary | ICD-10-CM | POA: Diagnosis present

## 2020-07-29 DIAGNOSIS — N182 Chronic kidney disease, stage 2 (mild): Secondary | ICD-10-CM | POA: Diagnosis not present

## 2020-07-29 DIAGNOSIS — E876 Hypokalemia: Secondary | ICD-10-CM | POA: Diagnosis present

## 2020-07-29 DIAGNOSIS — Z87891 Personal history of nicotine dependence: Secondary | ICD-10-CM | POA: Diagnosis not present

## 2020-07-29 DIAGNOSIS — T502X5A Adverse effect of carbonic-anhydrase inhibitors, benzothiadiazides and other diuretics, initial encounter: Secondary | ICD-10-CM | POA: Diagnosis present

## 2020-07-29 DIAGNOSIS — J1282 Pneumonia due to coronavirus disease 2019: Secondary | ICD-10-CM | POA: Diagnosis present

## 2020-07-29 DIAGNOSIS — Z791 Long term (current) use of non-steroidal anti-inflammatories (NSAID): Secondary | ICD-10-CM | POA: Diagnosis not present

## 2020-07-29 DIAGNOSIS — R0602 Shortness of breath: Secondary | ICD-10-CM | POA: Diagnosis not present

## 2020-07-29 LAB — BASIC METABOLIC PANEL
Anion gap: 13 (ref 5–15)
BUN: 22 mg/dL (ref 8–23)
CO2: 26 mmol/L (ref 22–32)
Calcium: 9 mg/dL (ref 8.9–10.3)
Chloride: 96 mmol/L — ABNORMAL LOW (ref 98–111)
Creatinine, Ser: 1.41 mg/dL — ABNORMAL HIGH (ref 0.61–1.24)
GFR calc Af Amer: 53 mL/min — ABNORMAL LOW (ref >60)
GFR calc non Af Amer: 45 mL/min — ABNORMAL LOW (ref >60)
Glucose, Bld: 108 mg/dL — ABNORMAL HIGH (ref 70–99)
Potassium: 4 mmol/L (ref 3.5–5.1)
Sodium: 135 mmol/L (ref 135–145)

## 2020-07-29 LAB — CBC
HCT: 45.2 % (ref 39.0–52.0)
Hemoglobin: 15.7 g/dL (ref 13.0–17.0)
MCH: 31.9 pg (ref 26.0–34.0)
MCHC: 34.7 g/dL (ref 30.0–36.0)
MCV: 91.9 fL (ref 80.0–100.0)
Platelets: 200 10*3/uL (ref 150–400)
RBC: 4.92 MIL/uL (ref 4.22–5.81)
RDW: 14.1 % (ref 11.5–15.5)
WBC: 7.6 10*3/uL (ref 4.0–10.5)
nRBC: 0 % (ref 0.0–0.2)

## 2020-07-29 LAB — TROPONIN I (HIGH SENSITIVITY)
Troponin I (High Sensitivity): 60 ng/L — ABNORMAL HIGH (ref <18)
Troponin I (High Sensitivity): 62 ng/L — ABNORMAL HIGH (ref <18)

## 2020-07-29 NOTE — ED Triage Notes (Signed)
Pt arrives to ED via ems from urgent care where he tested covid +. Cough starting x 1 week ago. Pt sent to ed due to low oxygen saturations. Pt currently 87-88% room air. 100% on 2L [place]. Pt reports generalized body ache and chest pain he contributes to coughing.  NAD noted at this time.

## 2020-07-29 NOTE — ED Triage Notes (Signed)
Pt comes into the ED via EMS from [place] urgent care with c/o abd pain with diarrhea for the past week. 96HR, 18RR, 97-98%2L [place], was 93%RA.

## 2020-07-29 NOTE — ED Notes (Signed)
Patient is being discharged from the Urgent Care and sent to the Emergency Department via EMS . Per Barkley Boards, NP, patient is in need of higher level of care due to hypoxia. Patient is aware and verbalizes understanding of plan of care.  Vitals:   07/29/20 1129 07/29/20 1135  BP:    Pulse:  95  Resp:    Temp:    SpO2: (!) 87% 95%

## 2020-07-29 NOTE — Discharge Instructions (Addendum)
COVID positive today at Urgent Care.  O2 sat 91%; repeated 87%

## 2020-07-29 NOTE — ED Triage Notes (Signed)
Patient is here with wife/caregiver c/o cough, intermittent fever, diarrhea, and fatigue x1 week. Patient has hx. COPD but patient is not COVID vaccinated. Patient has received his flu shot this season. Requesting COVID testing.

## 2020-07-29 NOTE — ED Provider Notes (Signed)
Caleb Bryant    CSN: [id number] Arrival date & time: 07/29/20  1025      History   Chief Complaint Chief Complaint  Patient presents with   Fever   Cough   Diarrhea   Fatigue    HPI Caleb Geiger Gowens Sr. is a 86 y.o. male.   Patient presents with 1 week history of fatigue, intermittent fever, cough that was originally nonproductive but has loosened some in the last day.  He also reports some diarrhea in the last 1 to 2 days.  He denies chest pain, shortness of breath, vomiting, or other symptoms.  Treatment attempted at home with [facility].  His medical history includes COPD, pulmonary fibrosis, thoracic aortic aneurysm, hypertension, PVD, CKD, atrial flutter, diabetes.  The history is provided by the patient, the spouse and medical records.    Past Medical History:  Diagnosis Date   Anxiety    Atrial flutter ([place])    "just dx'd today" (11/04/2013)   Benign prostatic hypertrophy    Borderline diabetes    Colon, diverticulosis    COPD (chronic obstructive pulmonary disease) (H[facility])    Dysrhythmia    Exertional shortness of breath    Gout    Hyperlipidemia    Hypertension    Osteoarthritis    "all over" (11/04/2013)   PVD (peripheral vascular disease) ([place])     Patient Active Problem List   Diagnosis Date Noted   GERD (gastroesophageal reflux disease) 10/17/2016   CKD stage 3 due to type 2 diabetes mellitus ([place]) 10/04/2016   Idiopathic pulmonary fibrosis ([place]) 02/25/2016   Thoracic aortic aneurysm without rupture ([place]) 02/01/2016   Chronic cough 01/25/2016   Midline thoracic back pain 10/19/2015   Advanced directives, counseling/discussion 10/14/2015   History of atrial flutter 12/21/2014   Ventral hernia without obstruction or gangrene 76/22/6333   Chronic systolic CHF (congestive heart failure) ([place]) 12/23/2013   Intermediate coronary syndrome ([place]) 11/04/2013   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   DDD (degenerative disc  disease), cervical 03/31/2013   Fuchs' corneal dystrophy 02/19/2013   Neuropathy due to secondary diabetes mellitus ([place]) 12/31/2012   Gout, chronic, 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   DM (diabetes mellitus) with peripheral vascular complication ([place]) 54/56/2563   Shoulder pain, bilateral 11/06/2011   Vitamin B12 deficiency 03/11/2009   INTERMITTENT VERTIGO 03/11/2009   Mild dementia ([place]) 09/01/2008   HYPERCHOLESTEROLEMIA 11/29/2007   ALLERGIC RHINITIS 11/29/2007   HYPERTENSION, BENIGN ESSENTIAL 11/14/2007   OSTEOARTHRITIS 06/13/2007   Peripheral vascular disease due to secondary diabetes mellitus ([place]) 06/12/2007   DIVERTICULOSIS, COLON 06/12/2007   BENIGN PROSTATIC HYPERTROPHY 06/12/2007    Past Surgical History:  Procedure Laterality Date   albation  11/04/13   ANKLE FRACTURE SURGERY Right    APPENDECTOMY  1997   "days before 1st colon OR"   ATRIAL FLUTTER ABLATION N/A 11/26/2013   Procedure: ATRIAL FLUTTER ABLATION;  Surgeon: Evans Lance, MD;  Location[facility] LAB;  Service: Cardiovascular;  Laterality: N/A;   CATARACT EXTRACTION, BILATERAL Bilateral    CHOLECYSTECTOMY  12/03   COLON SURGERY     "diverticulitis"   COLONOSCOPY WITH ESOPHAGOGASTRODUODENOSCOPY (EGD)  12/00   gastic polyp    COLONOSCOPY WITH ESOPHAGOGASTRODUODENOSCOPY (EGD)  11/07,12/2011   gastritis, 2 gastic polyps   COLOSTOMY  1997   COLOSTOMY REVERSAL     "think I wore it 4 months" (11/04/2013)   CORNEAL TRANSPLANT Right    CORONARY ANGIOGRAM  11/05/2013   Procedure: CORONARY ANGIOGRAM;  Surgeon: Wellington Hampshire,  MD;  Location: [facility] CATH LAB;  Service: Cardiovascular;;   ESOPHAGOGASTRODUODENOSCOPY (EGD) WITH PROPOFOL N/A 08/13/2019   Procedure: ESOPHAGOGASTRODUODENOSCOPY (EGD) WITH PROPOFOL;  Surgeon: Robert Bellow, MD;  Location: [facility] ENDOSCOPY;  Service: Endoscopy;  Laterality: N/A;   HERNIA REPAIR  11/05/14   ventral hernia   INGUINAL HERNIA REPAIR Left    PARTIAL COLECTOMY   1997   "diverticulitis; busted; [place]"   stress cardiolite negative  9/02   EF 60%   TONSILLECTOMY AND ADENOIDECTOMY     child   VENTRAL HERNIA REPAIR  11/07   [place]       Home Medications    Prior to Admission medications   Medication Sig Start Date End Date Taking? Authorizing Provider  allopurinol (ZYLOPRIM) 100 MG tablet TAKE 1 TABLET(100 MG) BY MOUTH DAILY 01/01/20  Yes Bedsole, Amy E, MD  atorvastatin (LIPITOR) 20 MG tablet Take one table by mouth daily 01/01/20  Yes Bedsole, Amy E, MD  Cyanocobalamin (B-12 PO) Take 1,000 mcg by mouth daily.    Yes [provider]  fluorometholone (FML) 0.1 % ophthalmic suspension 1 drop 2 (two) times daily. 11/19/19  Yes [provider]  hydrochlorothiazide (HYDRODIURIL) 25 MG tablet Take one tablet by mouth daily 01/01/20  Yes Bedsole, Amy E, MD  albuterol (VENTOLIN HFA) 108 (90 Base) MCG/ACT inhaler Inhale 2 puffs into the lungs every 4 (four) hours as needed for shortness of breath or wheezing. 02/29/20   [provider]  meloxicam (MOBIC) 7.5 MG tablet Take 7.5 mg by mouth daily. 07/13/20   [provider]  methocarbamol (ROBAXIN) 500 MG tablet Take 500 mg by mouth 3 (three) times daily as needed. 07/13/20   [provider]  pregabalin (LYRICA) 50 MG capsule Take 1 capsule (50 mg total) by mouth 2 (two) times daily. Patient not taking: Reported on 07/29/2020 12/05/19   [name], MD    Family History Family History  Problem Relation Age of Onset   Coronary artery disease Sister    Heart failure Mother     Social History Social History   Tobacco Use   Smoking status: Former Smoker    Packs/day: 3.00    Years: 30.00    Pack years: 90.00    Types: Cigarettes   Smokeless tobacco: Former Systems developer    Types: Chew   Tobacco comment: 11/04/2013 "quit smoking in the 1980's or so; quit chewing before I quit smoking"  Vaping Use   Vaping Use: Never used  Substance Use Topics   Alcohol use:  No    Comment: 11/04/2013 "quit driking in 1980's; was called a weekend alcoholic"   Drug use: No     Allergies   Codeine   Review of Systems Review of Systems  Constitutional: Positive for fatigue and fever. Negative for chills.  HENT: Negative for ear pain and sore throat.   Eyes: Negative for pain and visual disturbance.  Respiratory: Positive for cough. Negative for shortness of breath.   Cardiovascular: Negative for chest pain and palpitations.  Gastrointestinal: Positive for diarrhea. Negative for abdominal pain and vomiting.  Genitourinary: Negative for dysuria and hematuria.  Musculoskeletal: Negative for arthralgias and back pain.  Skin: Negative for color change and rash.  Neurological: Negative for seizures and syncope.  All other systems reviewed and are negative.    Physical Exam Triage Vital Signs ED Triage Vitals  Enc Vitals Group     BP      Pulse      Resp  Temp      Temp src      SpO2      Weight      Height      Head Circumference      Peak Flow      Pain Score      Pain Loc      Pain Edu?      Excl. in [place]?    No data found.  Updated Vital Signs BP 111/78    Pulse (!) 50    Temp 100.1 F (37.8 C)    Resp 16    SpO2 (!) 87%   Visual Acuity Right Eye Distance:   Left Eye Distance:   Bilateral Distance:    Right Eye Near:   Left Eye Near:    Bilateral Near:     Physical Exam Vitals and nursing note reviewed.  Constitutional:      General: He is not in acute distress.    Appearance: He is well-developed.  HENT:     Head: Normocephalic and atraumatic.     Right Ear: Tympanic membrane normal.     Left Ear: Tympanic membrane normal.     Nose: Nose normal.     Mouth/Throat:     Mouth: Mucous membranes are moist.     Pharynx: Oropharynx is clear.  Eyes:     Conjunctiva/sclera: Conjunctivae normal.  Cardiovascular:     Rate and Rhythm: Normal rate and regular rhythm.     Heart sounds: No murmur heard.   Pulmonary:     Effort:  Pulmonary effort is normal. No respiratory distress.     Breath sounds: Normal breath sounds. No wheezing or rhonchi.     Comments: Diminished breath sounds. Abdominal:     Palpations: Abdomen is soft.     Tenderness: There is no abdominal tenderness.  Musculoskeletal:     Cervical back: Neck supple.  Skin:    General: Skin is warm and dry.     Findings: No rash.  Neurological:     General: No focal deficit present.     Mental Status: He is alert and oriented to person, place, and time.     Gait: Gait normal.  Psychiatric:        Mood and Affect: Mood normal.        Behavior: Behavior normal.      UC Treatments / Results  Labs (all labs ordered are listed, but only abnormal results are displayed) Labs Reviewed  POC SARS CORONAVIRUS 2 AG -  ED    EKG   Radiology No results found.  Procedures Procedures (including critical care time)  Medications Ordered in UC Medications - No data to display  Initial Impression / Assessment and Plan / UC Course  I have reviewed the triage vital signs and the nursing notes.  Pertinent labs & imaging results that were available during my care of the patient were reviewed by me and considered in my medical decision making (see chart for details).   COVID-19.  Hypoxia.  O2 sat 91% then repeated at 87%.  Sending patient to the ED via EMS.       Final Clinical Impressions(s) / UC Diagnoses   Final diagnoses:  [id number]  Hypoxia     Discharge Instructions     COVID positive today at Urgent Care.  O2 sat 91%; repeated 87%        ED Prescriptions    None     PDMP not reviewed this encounter.  Sharion Balloon, NP 07/29/20 [phone]

## 2020-07-30 ENCOUNTER — Inpatient Hospital Stay
Admission: EM | Admit: 2020-07-30 | Discharge: 2020-08-03 | DRG: 177 | Disposition: A | Discharge status: Home / Self Care | Payer: PPO | Attending: Internal Medicine | Admitting: Internal Medicine

## 2020-07-30 ENCOUNTER — Emergency Department
Admission: EM | Admit: 2020-07-30 | Discharge: 2020-07-30 | Disposition: A | Discharge status: Home / Self Care | Payer: PPO | Source: Home / Self Care | Attending: Emergency Medicine | Admitting: Emergency Medicine

## 2020-07-30 ENCOUNTER — Emergency Department: Payer: PPO

## 2020-07-30 ENCOUNTER — Telehealth: Payer: Self-pay | Admitting: Infectious Diseases

## 2020-07-30 ENCOUNTER — Other Ambulatory Visit: Payer: Self-pay

## 2020-07-30 ENCOUNTER — Telehealth: Payer: Self-pay | Admitting: *Deleted

## 2020-07-30 DIAGNOSIS — E876 Hypokalemia: Secondary | ICD-10-CM | POA: Diagnosis not present

## 2020-07-30 DIAGNOSIS — I5022 Chronic systolic (congestive) heart failure: Secondary | ICD-10-CM | POA: Diagnosis present

## 2020-07-30 DIAGNOSIS — Z947 Corneal transplant status: Secondary | ICD-10-CM | POA: Diagnosis not present

## 2020-07-30 DIAGNOSIS — E860 Dehydration: Secondary | ICD-10-CM | POA: Diagnosis present

## 2020-07-30 DIAGNOSIS — Y929 Unspecified place or not applicable: Secondary | ICD-10-CM

## 2020-07-30 DIAGNOSIS — R0602 Shortness of breath: Secondary | ICD-10-CM

## 2020-07-30 DIAGNOSIS — E1122 Type 2 diabetes mellitus with diabetic chronic kidney disease: Secondary | ICD-10-CM | POA: Diagnosis present

## 2020-07-30 DIAGNOSIS — E785 Hyperlipidemia, unspecified: Secondary | ICD-10-CM | POA: Diagnosis present

## 2020-07-30 DIAGNOSIS — N1831 Chronic kidney disease, stage 3a: Secondary | ICD-10-CM | POA: Diagnosis present

## 2020-07-30 DIAGNOSIS — Z8249 Family history of ischemic heart disease and other diseases of the circulatory system: Secondary | ICD-10-CM

## 2020-07-30 DIAGNOSIS — Z79899 Other long term (current) drug therapy: Secondary | ICD-10-CM

## 2020-07-30 DIAGNOSIS — Z791 Long term (current) use of non-steroidal anti-inflammatories (NSAID): Secondary | ICD-10-CM

## 2020-07-30 DIAGNOSIS — R079 Chest pain, unspecified: Secondary | ICD-10-CM | POA: Diagnosis not present

## 2020-07-30 DIAGNOSIS — E1169 Type 2 diabetes mellitus with other specified complication: Secondary | ICD-10-CM

## 2020-07-30 DIAGNOSIS — U071 COVID-19: Secondary | ICD-10-CM | POA: Diagnosis present

## 2020-07-30 DIAGNOSIS — J1282 Pneumonia due to coronavirus disease 2019: Secondary | ICD-10-CM | POA: Diagnosis not present

## 2020-07-30 DIAGNOSIS — I10 Essential (primary) hypertension: Secondary | ICD-10-CM | POA: Diagnosis not present

## 2020-07-30 DIAGNOSIS — N400 Enlarged prostate without lower urinary tract symptoms: Secondary | ICD-10-CM | POA: Diagnosis present

## 2020-07-30 DIAGNOSIS — N182 Chronic kidney disease, stage 2 (mild): Secondary | ICD-10-CM

## 2020-07-30 DIAGNOSIS — J440 Chronic obstructive pulmonary disease with acute lower respiratory infection: Secondary | ICD-10-CM | POA: Diagnosis present

## 2020-07-30 DIAGNOSIS — Z87891 Personal history of nicotine dependence: Secondary | ICD-10-CM

## 2020-07-30 DIAGNOSIS — E1151 Type 2 diabetes mellitus with diabetic peripheral angiopathy without gangrene: Secondary | ICD-10-CM | POA: Diagnosis present

## 2020-07-30 DIAGNOSIS — E1142 Type 2 diabetes mellitus with diabetic polyneuropathy: Secondary | ICD-10-CM | POA: Diagnosis present

## 2020-07-30 DIAGNOSIS — Z885 Allergy status to narcotic agent status: Secondary | ICD-10-CM

## 2020-07-30 DIAGNOSIS — J449 Chronic obstructive pulmonary disease, unspecified: Secondary | ICD-10-CM | POA: Diagnosis not present

## 2020-07-30 DIAGNOSIS — I1310 Hypertensive heart and chronic kidney disease without heart failure, with stage 1 through stage 4 chronic kidney disease, or unspecified chronic kidney disease: Secondary | ICD-10-CM | POA: Diagnosis present

## 2020-07-30 DIAGNOSIS — T502X5A Adverse effect of carbonic-anhydrase inhibitors, benzothiadiazides and other diuretics, initial encounter: Secondary | ICD-10-CM | POA: Diagnosis present

## 2020-07-30 DIAGNOSIS — E538 Deficiency of other specified B group vitamins: Secondary | ICD-10-CM | POA: Diagnosis present

## 2020-07-30 DIAGNOSIS — J9601 Acute respiratory failure with hypoxia: Secondary | ICD-10-CM | POA: Diagnosis present

## 2020-07-30 DIAGNOSIS — E7800 Pure hypercholesterolemia, unspecified: Secondary | ICD-10-CM | POA: Diagnosis present

## 2020-07-30 DIAGNOSIS — E878 Other disorders of electrolyte and fluid balance, not elsewhere classified: Secondary | ICD-10-CM | POA: Diagnosis present

## 2020-07-30 DIAGNOSIS — M1A9XX1 Chronic gout, unspecified, with tophus (tophi): Secondary | ICD-10-CM | POA: Diagnosis present

## 2020-07-30 DIAGNOSIS — E1159 Type 2 diabetes mellitus with other circulatory complications: Secondary | ICD-10-CM

## 2020-07-30 DIAGNOSIS — Z20822 Contact with and (suspected) exposure to covid-19: Secondary | ICD-10-CM

## 2020-07-30 DIAGNOSIS — K219 Gastro-esophageal reflux disease without esophagitis: Secondary | ICD-10-CM | POA: Diagnosis present

## 2020-07-30 DIAGNOSIS — F419 Anxiety disorder, unspecified: Secondary | ICD-10-CM | POA: Diagnosis present

## 2020-07-30 LAB — BRAIN NATRIURETIC PEPTIDE
B Natriuretic Peptide: 57.7 pg/mL (ref 0.0–100.0)
B Natriuretic Peptide: 64 pg/mL (ref 0.0–100.0)

## 2020-07-30 LAB — CBC
HCT: 45.7 % (ref 39.0–52.0)
Hemoglobin: 15.3 g/dL (ref 13.0–17.0)
MCH: 31.5 pg (ref 26.0–34.0)
MCHC: 33.5 g/dL (ref 30.0–36.0)
MCV: 94 fL (ref 80.0–100.0)
Platelets: 210 10*3/uL (ref 150–400)
RBC: 4.86 MIL/uL (ref 4.22–5.81)
RDW: 13.8 % (ref 11.5–15.5)
WBC: 7.8 10*3/uL (ref 4.0–10.5)
nRBC: 0 % (ref 0.0–0.2)

## 2020-07-30 LAB — HEPATIC FUNCTION PANEL
ALT: 22 U/L (ref 0–44)
AST: 38 U/L (ref 15–41)
Albumin: 3.9 g/dL (ref 3.5–5.0)
Alkaline Phosphatase: 62 U/L (ref 38–126)
Bilirubin, Direct: 0.2 mg/dL (ref 0.0–0.2)
Indirect Bilirubin: 0.9 mg/dL (ref 0.3–0.9)
Total Bilirubin: 1.1 mg/dL (ref 0.3–1.2)
Total Protein: 7.9 g/dL (ref 6.5–8.1)

## 2020-07-30 LAB — FIBRIN DERIVATIVES D-DIMER (ARMC ONLY): Fibrin derivatives D-dimer (ARMC): 657.95 ng/mL (FEU) — ABNORMAL HIGH (ref 0.00–499.00)

## 2020-07-30 LAB — TROPONIN I (HIGH SENSITIVITY)
Troponin I (High Sensitivity): 64 ng/L — ABNORMAL HIGH (ref <18)
Troponin I (High Sensitivity): 41 ng/L — ABNORMAL HIGH (ref <18)

## 2020-07-30 LAB — BASIC METABOLIC PANEL
Anion gap: 13 (ref 5–15)
BUN: 28 mg/dL — ABNORMAL HIGH (ref 8–23)
CO2: 29 mmol/L (ref 22–32)
Calcium: 8.8 mg/dL — ABNORMAL LOW (ref 8.9–10.3)
Chloride: 93 mmol/L — ABNORMAL LOW (ref 98–111)
Creatinine, Ser: 1.33 mg/dL — ABNORMAL HIGH (ref 0.61–1.24)
GFR calc Af Amer: 56 mL/min — ABNORMAL LOW (ref >60)
GFR calc non Af Amer: 49 mL/min — ABNORMAL LOW (ref >60)
Glucose, Bld: 102 mg/dL — ABNORMAL HIGH (ref 70–99)
Potassium: 3.3 mmol/L — ABNORMAL LOW (ref 3.5–5.1)
Sodium: 135 mmol/L (ref 135–145)

## 2020-07-30 LAB — FERRITIN: Ferritin: 130 ng/mL (ref 24–336)

## 2020-07-30 LAB — MAGNESIUM
Magnesium: 1 mg/dL — ABNORMAL LOW (ref 1.7–2.4)
Magnesium: 1.6 mg/dL — ABNORMAL LOW (ref 1.7–2.4)

## 2020-07-30 LAB — RESPIRATORY PANEL BY RT PCR (FLU A&B, COVID)
Influenza A by PCR: NEGATIVE
Influenza B by PCR: NEGATIVE
SARS Coronavirus 2 by RT PCR: POSITIVE — AB

## 2020-07-30 LAB — POC SARS CORONAVIRUS 2 AG -  ED: SARS Coronavirus 2 Ag: POSITIVE — AB

## 2020-07-30 LAB — LACTATE DEHYDROGENASE: LDH: 172 U/L (ref 98–192)

## 2020-07-30 LAB — PROCALCITONIN
Procalcitonin: <0.1 ng/mL
Procalcitonin: <0.1 ng/mL

## 2020-07-30 MED ORDER — PREDNISONE 50 MG PO TABS
50.0000 mg | ORAL_TABLET | Freq: Every day | ORAL | Status: DC
  Administered 2020-08-03: 50 mg via ORAL
  Filled 2020-07-30: qty 1

## 2020-07-30 MED ORDER — POTASSIUM CHLORIDE CRYS ER 20 MEQ PO TBCR
40.0000 meq | EXTENDED_RELEASE_TABLET | Freq: Once | ORAL | Status: AC
  Administered 2020-07-30: 40 meq via ORAL
  Filled 2020-07-30: qty 2

## 2020-07-30 MED ORDER — VITAMIN D 25 MCG (1000 UNIT) PO TABS
1000.0000 [IU] | ORAL_TABLET | Freq: Every day | ORAL | Status: DC
  Administered 2020-07-31 – 2020-08-03 (×4): 1000 [IU] via ORAL
  Filled 2020-07-30 (×4): qty 1

## 2020-07-30 MED ORDER — POTASSIUM CHLORIDE CRYS ER 20 MEQ PO TBCR: 40.0000 meq | EXTENDED_RELEASE_TABLET | Freq: Once | ORAL | Status: DC

## 2020-07-30 MED ORDER — PREGABALIN 50 MG PO CAPS
50.0000 mg | ORAL_CAPSULE | Freq: Two times a day (BID) | ORAL | Status: DC
  Administered 2020-07-30 – 2020-07-31 (×2): 50 mg via ORAL
  Filled 2020-07-30 (×2): qty 1

## 2020-07-30 MED ORDER — INSULIN ASPART 100 UNIT/ML ~~LOC~~ SOLN
0.0000 [IU] | Freq: Three times a day (TID) | SUBCUTANEOUS | Status: DC
  Administered 2020-07-31: 2 [IU] via SUBCUTANEOUS
  Administered 2020-07-31: 09:00:00 3 [IU] via SUBCUTANEOUS
  Administered 2020-07-31: 2 [IU] via SUBCUTANEOUS
  Administered 2020-07-31 – 2020-08-01 (×3): 3 [IU] via SUBCUTANEOUS
  Administered 2020-08-01 – 2020-08-02 (×2): 2 [IU] via SUBCUTANEOUS
  Administered 2020-08-03: 08:00:00 3 [IU] via SUBCUTANEOUS
  Filled 2020-07-30 (×9): qty 1

## 2020-07-30 MED ORDER — FLUOROMETHOLONE 0.1 % OP SUSP
1.0000 [drp] | Freq: Two times a day (BID) | OPHTHALMIC | Status: DC
  Filled 2020-07-30: qty 5

## 2020-07-30 MED ORDER — LACTATED RINGERS IV BOLUS
1000.0000 mL | Freq: Once | INTRAVENOUS | Status: AC
  Administered 2020-07-30: 1000 mL via INTRAVENOUS

## 2020-07-30 MED ORDER — METHYLPREDNISOLONE SODIUM SUCC 125 MG IJ SOLR
80.0000 mg | Freq: Two times a day (BID) | INTRAMUSCULAR | Status: AC
  Administered 2020-07-31 – 2020-08-02 (×6): 80 mg via INTRAVENOUS
  Filled 2020-07-30 (×6): qty 2

## 2020-07-30 MED ORDER — ACETAMINOPHEN 325 MG PO TABS
650.0000 mg | ORAL_TABLET | Freq: Four times a day (QID) | ORAL | Status: DC | PRN
  Administered 2020-08-02: 16:00:00 650 mg via ORAL
  Filled 2020-07-30: qty 2

## 2020-07-30 MED ORDER — ENOXAPARIN SODIUM 40 MG/0.4ML ~~LOC~~ SOLN
40.0000 mg | SUBCUTANEOUS | Status: DC
  Administered 2020-07-31 – 2020-08-02 (×3): 40 mg via SUBCUTANEOUS
  Filled 2020-07-30 (×3): qty 0.4

## 2020-07-30 MED ORDER — TRAZODONE HCL 50 MG PO TABS
25.0000 mg | ORAL_TABLET | Freq: Every evening | ORAL | Status: DC | PRN
  Filled 2020-07-30: qty 1

## 2020-07-30 MED ORDER — CYANOCOBALAMIN 500 MCG PO TABS
1000.0000 ug | ORAL_TABLET | Freq: Every day | ORAL | Status: DC
  Administered 2020-07-31 – 2020-08-03 (×4): 1000 ug via ORAL
  Filled 2020-07-30 (×6): qty 2

## 2020-07-30 MED ORDER — ZINC SULFATE 220 (50 ZN) MG PO CAPS
220.0000 mg | ORAL_CAPSULE | Freq: Every day | ORAL | Status: DC
  Administered 2020-07-31 – 2020-08-03 (×4): 220 mg via ORAL
  Filled 2020-07-30 (×4): qty 1

## 2020-07-30 MED ORDER — SODIUM CHLORIDE 0.9 % IV SOLN: INTRAVENOUS | Status: DC

## 2020-07-30 MED ORDER — FAMOTIDINE 20 MG PO TABS
20.0000 mg | ORAL_TABLET | Freq: Every day | ORAL | Status: DC
  Administered 2020-07-31 – 2020-08-03 (×4): 20 mg via ORAL
  Filled 2020-07-30 (×4): qty 1

## 2020-07-30 MED ORDER — HYDROCHLOROTHIAZIDE 25 MG PO TABS
25.0000 mg | ORAL_TABLET | Freq: Every day | ORAL | Status: DC
  Administered 2020-07-31 – 2020-08-03 (×4): 25 mg via ORAL
  Filled 2020-07-30 (×4): qty 1

## 2020-07-30 MED ORDER — SODIUM CHLORIDE 0.9 % IV SOLN
100.0000 mg | Freq: Every day | INTRAVENOUS | Status: DC
  Administered 2020-07-31 – 2020-08-02 (×3): 100 mg via INTRAVENOUS
  Filled 2020-07-30 (×4): qty 20
  Filled 2020-07-30: qty 100

## 2020-07-30 MED ORDER — ASCORBIC ACID 500 MG PO TABS
500.0000 mg | ORAL_TABLET | Freq: Every day | ORAL | Status: DC
  Administered 2020-07-31 – 2020-08-03 (×4): 500 mg via ORAL
  Filled 2020-07-30 (×4): qty 1

## 2020-07-30 MED ORDER — MAGNESIUM HYDROXIDE 400 MG/5ML PO SUSP
30.0000 mL | Freq: Every day | ORAL | Status: DC | PRN
  Filled 2020-07-30: qty 30

## 2020-07-30 MED ORDER — ASPIRIN EC 81 MG PO TBEC
81.0000 mg | DELAYED_RELEASE_TABLET | Freq: Every day | ORAL | Status: DC
  Administered 2020-07-31 – 2020-08-03 (×4): 81 mg via ORAL
  Filled 2020-07-30 (×4): qty 1

## 2020-07-30 MED ORDER — METHOCARBAMOL 500 MG PO TABS
500.0000 mg | ORAL_TABLET | Freq: Three times a day (TID) | ORAL | Status: DC
  Administered 2020-07-30: 500 mg via ORAL
  Filled 2020-07-30 (×2): qty 1

## 2020-07-30 MED ORDER — BARICITINIB 2 MG PO TABS
2.0000 mg | ORAL_TABLET | Freq: Every day | ORAL | Status: DC
  Administered 2020-07-30 – 2020-08-03 (×5): 2 mg via ORAL
  Filled 2020-07-30 (×7): qty 1

## 2020-07-30 MED ORDER — GUAIFENESIN ER 600 MG PO TB12
600.0000 mg | ORAL_TABLET | Freq: Two times a day (BID) | ORAL | Status: DC
  Administered 2020-07-30 – 2020-08-03 (×8): 600 mg via ORAL
  Filled 2020-07-30 (×8): qty 1

## 2020-07-30 MED ORDER — ONDANSETRON HCL 4 MG/2ML IJ SOLN: 4.0000 mg | Freq: Four times a day (QID) | INTRAMUSCULAR | Status: DC | PRN

## 2020-07-30 MED ORDER — GUAIFENESIN-DM 100-10 MG/5ML PO SYRP
10.0000 mL | ORAL_SOLUTION | ORAL | Status: DC | PRN
  Administered 2020-08-02 – 2020-08-03 (×2): 10 mL via ORAL
  Filled 2020-07-30 (×3): qty 10

## 2020-07-30 MED ORDER — DEXAMETHASONE SODIUM PHOSPHATE 10 MG/ML IJ SOLN
6.0000 mg | INTRAMUSCULAR | Status: DC
  Administered 2020-07-30: 6 mg via INTRAVENOUS
  Filled 2020-07-30: qty 1

## 2020-07-30 MED ORDER — SODIUM CHLORIDE 0.9 % IV SOLN
200.0000 mg | Freq: Once | INTRAVENOUS | Status: AC
  Administered 2020-07-30: 200 mg via INTRAVENOUS
  Filled 2020-07-30: qty 200

## 2020-07-30 MED ORDER — ALLOPURINOL 100 MG PO TABS
100.0000 mg | ORAL_TABLET | Freq: Every day | ORAL | Status: DC
  Administered 2020-07-31 – 2020-08-03 (×4): 100 mg via ORAL
  Filled 2020-07-30 (×4): qty 1

## 2020-07-30 MED ORDER — ONDANSETRON HCL 4 MG PO TABS: 4.0000 mg | ORAL_TABLET | Freq: Four times a day (QID) | ORAL | Status: DC | PRN

## 2020-07-30 MED ORDER — ALBUTEROL SULFATE HFA 108 (90 BASE) MCG/ACT IN AERS
2.0000 | INHALATION_SPRAY | RESPIRATORY_TRACT | Status: DC | PRN
  Filled 2020-07-30: qty 6.7

## 2020-07-30 MED ORDER — HYDROCOD POLST-CPM POLST ER 10-8 MG/5ML PO SUER: 5.0000 mL | Freq: Two times a day (BID) | ORAL | Status: DC | PRN

## 2020-07-30 MED ORDER — ATORVASTATIN CALCIUM 20 MG PO TABS
20.0000 mg | ORAL_TABLET | Freq: Every day | ORAL | Status: DC
  Administered 2020-07-31 – 2020-08-02 (×3): 20 mg via ORAL
  Filled 2020-07-30 (×4): qty 1

## 2020-07-30 NOTE — ED Provider Notes (Signed)
[facility] Emergency Department Provider Note   ____________________________________________   First MD Initiated Contact with Patient 07/30/20 0019     (approximate)  I have reviewed the triage vital signs and the nursing notes.   HISTORY  Chief Complaint Shortness of Breath, covid +, and Chest Pain    HPI Caleb Blyden Riemann Sr. is a 86 y.o. male with past medical history of hypertension, hyperlipidemia, COPD, and atrial flutter who presents to the ED complaining of cough and shortness of breath.  Patient reports that he initially developed cough with some mild difficulty breathing 5 days ago.  He states he will occasionally have sharp pains in his chest when he goes to take a deep breath, but he is not currently having any pain in his chest.  He denies any fevers and has not had any nausea or vomiting.  He initially presented to [facility] urgent care, where he reportedly had a rapid test for COVID-19 that was positive.  He was also found to be hypoxic on room air, placed on 2 L nasal cannula and brought to the ED for further evaluation.  Patient currently states that his breathing difficulties are mild and he is requesting to be discharged home.        Past Medical History:  Diagnosis Date  . Anxiety   . Atrial flutter ([place])    "just dx'd today" (11/04/2013)  . Benign prostatic hypertrophy   . Borderline diabetes   . Colon, diverticulosis   . COPD (chronic obstructive pulmonary disease) ([place])   . Dysrhythmia   . Exertional shortness of breath   . Gout   . Hyperlipidemia   . Hypertension   . Osteoarthritis    "all over" (11/04/2013)  . PVD (peripheral vascular disease) [facility])     Patient Active Problem List   Diagnosis Date Noted  . GERD (gastroesophageal reflux disease) 10/17/2016  . CKD stage 3 due to type 2 diabetes mellitus ([place]) 10/04/2016  . Idiopathic pulmonary fibrosis ([place]) 02/25/2016  . Thoracic aortic aneurysm without rupture ([place])  02/01/2016  . Chronic cough 01/25/2016  . Midline thoracic back pain 10/19/2015  . Advanced directives, counseling/discussion 10/14/2015  . History of atrial flutter 12/21/2014  . Ventral hernia without obstruction or gangrene 10/02/2014  . Chronic systolic CHF (congestive heart failure) ([place]) 12/23/2013  . Intermediate coronary syndrome ([place]) 11/04/2013  . 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  . DDD (degenerative disc disease), cervical 03/31/2013  . Fuchs' corneal dystrophy 02/19/2013  . Neuropathy due to secondary diabetes mellitus ([place]) 12/31/2012  . 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 DM (diabetes mellitus) with peripheral vascular complication ([place]) [id number]  . Shoulder pain, bilateral 11/06/2011  . Vitamin B12 deficiency 03/11/2009  . INTERMITTENT VERTIGO 03/11/2009  . Mild dementia ([place]) 09/01/2008  . HYPERCHOLESTEROLEMIA 11/29/2007  . ALLERGIC RHINITIS 11/29/2007  . HYPERTENSION, BENIGN ESSENTIAL 11/14/2007  . OSTEOARTHRITIS 06/13/2007  . Peripheral vascular disease due to secondary diabetes mellitus ([place]) 06/12/2007  . DIVERTICULOSIS, COLON 06/12/2007  . BENIGN PROSTATIC HYPERTROPHY 06/12/2007    Past Surgical History:  Procedure Laterality Date  . albation  11/04/13  . ANKLE FRACTURE SURGERY Right   . APPENDECTOMY  1997   "days before 1st colon OR"  . ATRIAL FLUTTER ABLATION N/A 11/26/2013   Procedure: ATRIAL FLUTTER ABLATION;  Surgeon: Evans Lance, MD;  Location: [facility] CATH LAB;  Service: Cardiovascular;  Laterality: N/A;  . CATARACT EXTRACTION, BILATERAL Bilateral   . CHOLECYSTECTOMY  12/03  . COLON SURGERY     "  diverticulitis"  . COLONOSCOPY WITH ESOPHAGOGASTRODUODENOSCOPY (EGD)  12/00   gastic polyp   . COLONOSCOPY WITH ESOPHAGOGASTRODUODENOSCOPY (EGD)  11/07,12/2011   gastritis, 2 gastic polyps  . COLOSTOMY  1997  . COLOSTOMY REVERSAL     "think I wore it 4 months" (11/04/2013)  . CORNEAL TRANSPLANT Right   . CORONARY ANGIOGRAM  11/05/2013   Procedure:  CORONARY ANGIOGRAM;  Surgeon: Wellington Hampshire, MD;  Location: [place] CATH LAB;  Service: Cardiovascular;;  . ESOPHAGOGASTRODUODENOSCOPY (EGD) WITH PROPOFOL N/A 08/13/2019   Procedure: ESOPHAGOGASTRODUODENOSCOPY (EGD) WITH PROPOFOL;  Surgeon: Robert Bellow, MD;  Location: [facility] ENDOSCOPY;  Service: Endoscopy;  Laterality: N/A;  . HERNIA REPAIR  11/05/14   ventral hernia  . INGUINAL HERNIA REPAIR Left   . PARTIAL COLECTOMY  1997   "diverticulitis; busted; [place]"  . stress cardiolite negative  9/02   EF 60%  . TONSILLECTOMY AND ADENOIDECTOMY     child  . VENTRAL HERNIA REPAIR  11/07   [place]    Prior to Admission medications   Medication Sig Start Date End Date Taking? Authorizing Provider  albuterol (VENTOLIN HFA) 108 (90 Base) MCG/ACT inhaler Inhale 2 puffs into the lungs every 4 (four) hours as needed for shortness of breath or wheezing. 02/29/20   [provider]  allopurinol (ZYLOPRIM) 100 MG tablet TAKE 1 TABLET(100 MG) BY MOUTH DAILY 01/01/20   Bedsole, Amy E, MD  atorvastatin (LIPITOR) 20 MG tablet Take one table by mouth daily 01/01/20   Bedsole, Amy E, MD  Cyanocobalamin (B-12 PO) Take 1,000 mcg by mouth daily.     [provider]  fluorometholone (FML) 0.1 % ophthalmic suspension 1 drop 2 (two) times daily. 11/19/19   [provider]  hydrochlorothiazide (HYDRODIURIL) 25 MG tablet Take one tablet by mouth daily 01/01/20   Bedsole, Amy E, MD  meloxicam (MOBIC) 7.5 MG tablet Take 7.5 mg by mouth daily. 07/13/20   [provider]  methocarbamol (ROBAXIN) 500 MG tablet Take 500 mg by mouth 3 (three) times daily as needed. 07/13/20   [provider]  pregabalin (LYRICA) 50 MG capsule Take 1 capsule (50 mg total) by mouth 2 (two) times daily. Patient not taking: Reported on 07/29/2020 12/05/19   [name], MD    Allergies Codeine  Family History  Problem Relation Age of Onset  . Coronary artery disease Sister   . Heart failure Mother      Social History Social History   Tobacco Use  . Smoking status: Former Smoker    Packs/day: 3.00    Years: 30.00    Pack years: 90.00    Types: Cigarettes  . Smokeless tobacco: Former Systems developer    Types: Chew  . Tobacco comment: 11/04/2013 "quit smoking in the 1980's or so; quit chewing before I quit smoking"  Vaping Use  . Vaping Use: Never used  Substance Use Topics  . Alcohol use: No    Comment: 11/04/2013 "quit driking in 1980's; was called a weekend alcoholic"  . Drug use: No    Review of Systems  Constitutional: No fever/chills Eyes: No visual changes. ENT: No sore throat. Cardiovascular: Positive for chest pain. Respiratory: Positive for cough and shortness of breath. Gastrointestinal: No abdominal pain.  No nausea, no vomiting.  No diarrhea.  No constipation. Genitourinary: Negative for dysuria. Musculoskeletal: Negative for back pain. Skin: Negative for rash. Neurological: Negative for headaches, focal weakness or numbness.  ____________________________________________   PHYSICAL EXAM:  VITAL SIGNS: ED Triage Vitals  Enc  Vitals Group     BP 07/29/20 1324 123/73     Pulse Rate 07/29/20 1324 84     Resp 07/29/20 1324 18     Temp 07/29/20 1324 98 F (36.7 C)     Temp Source 07/29/20 1324 Axillary     SpO2 07/29/20 1324 100 %     Weight 07/29/20 1325 175 lb (79.4 kg)     Height 07/29/20 1325 _0  (1.651 m)     Head Circumference --      Peak Flow --      Pain Score 07/29/20 1324 7     Pain Loc --      Pain Edu? --      Excl. in [place]? --     Constitutional: Alert and oriented. Eyes: Conjunctivae are normal. Head: Atraumatic. Nose: No congestion/rhinnorhea. Mouth/Throat: Mucous membranes are moist. Neck: Normal ROM Cardiovascular: Normal rate, regular rhythm. Grossly normal heart sounds. Respiratory: Normal respiratory effort.  No retractions. Lungs CTAB. Gastrointestinal: Soft and nontender. No distention. Genitourinary: deferred Musculoskeletal: No  lower extremity tenderness nor edema. Neurologic:  Normal speech and language. No gross focal neurologic deficits are appreciated. Skin:  Skin is warm, dry and intact. No rash noted. Psychiatric: Mood and affect are normal. Speech and behavior are normal.  ____________________________________________   LABS (all labs ordered are listed, but only abnormal results are displayed)  Labs Reviewed  BASIC METABOLIC PANEL - Abnormal; Notable for the following components:      Result Value   Chloride 96 (*)    Glucose, Bld 108 (*)    Creatinine, Ser 1.41 (*)    GFR calc non Af Amer 45 (*)    GFR calc Af Amer 53 (*)    All other components within normal limits  TROPONIN I (HIGH SENSITIVITY) - Abnormal; Notable for the following components:   Troponin I (High Sensitivity) 60 (*)    All other components within normal limits  TROPONIN I (HIGH SENSITIVITY) - Abnormal; Notable for the following components:   Troponin I (High Sensitivity) 62 (*)    All other components within normal limits  RESPIRATORY PANEL BY RT PCR (FLU A&B, COVID)  CBC   ____________________________________________  EKG  ED ECG REPORT I, Blake Divine, the attending physician, personally viewed and interpreted this ECG.   Date: 07/30/2020  EKG Time: 13:30  Rate: 90  Rhythm: normal sinus rhythm  Axis: Normal  Intervals:right bundle branch block  ST&T Change: None   PROCEDURES  Procedure(s) performed (including Critical Care):  Procedures   ____________________________________________   INITIAL IMPRESSION / ASSESSMENT AND PLAN / ED COURSE       86 year old male with past medical history of hypertension, hyperlipidemia, COPD, and atrial flutter who presents to the ED for cough and shortness of breath as well as intermittent sharp chest pain for the past 5 days.  He was reportedly diagnosed with COVID-19 at urgent care, however I am not able to see this result and we will perform confirmatory PCR.  EKG  shows no evidence of arrhythmia or ischemia, troponin is mildly elevated but stable on recheck and this is likely secondary to his chronic kidney disease.  He is chest pain-free and I doubt ACS at this time.  He describes his shortness of breath as mild at this time, however his O2 sats will drop down to 86-87% on room air while he was seated on the stretcher.  He was placed on 2 L nasal cannula with improvement and advised that  he will need to be admitted to the hospital.  He adamantly refuses admission and wishes to be discharged home.  I have explained the risks of this to him, including but not limited to worsening breathing and death, however he continues to wish to sign out AMA.  He has an inhaler available at home and was provided with information for monoclonal antibody infusion.  He was counseled to return to the ED for new worsening symptoms.      ____________________________________________   FINAL CLINICAL IMPRESSION(S) / ED DIAGNOSES  Final diagnoses:  Suspected COVID-19 virus infection  Shortness of breath     ED Discharge Orders    None       Note:  This document was prepared using Dragon voice recognition software and may include unintentional dictation errors.   Blake Divine, MD 07/30/20 [phone]

## 2020-07-30 NOTE — H&P (Signed)
[name]    MR#:  [id number]  DATE OF BIRTH:  1933-12-30  DATE OF ADMISSION:  07/30/2020  PRIMARY CARE PHYSICIAN: [name], MD   REQUESTING/REFERRING PHYSICIAN: Hulan Saas, MD  CHIEF COMPLAINT:   Chief Complaint  Patient presents with  . Shortness of Breath  . covid+    HISTORY OF PRESENT ILLNESS:  Caleb Bryant  is a 86 y.o. Caucasian male with a known history of hypertension, dyslipidemia, COPD, gout, BPH, anxiety, atrial flutter, gout, osteoarthritis and peripheral vascular disease, who presented to the emergency room with acute onset of worsening dyspnea and dry cough for the last week.  He admitted to diarrhea without nausea or vomiting.  He has lost his taste and smell.  He admitted to chills but apparently has not checked temperature.  He has been having fatigue and tiredness as well as body aches.  He tested positive for COVID-19 antigen yesterday in the ER here but left AGAINST MEDICAL ADVICE despite being hypoxic.  Today he became more short of breath and continued to be hypoxic.  He has not been vaccinated against COVID-19 as his wife had a dream that he took the shot and died.  Upon presentation to the emergency room, temperature 100.1 respiratory rate 16 with a pulse of 50 and pulse oximetry of 91% on room air and later 87% with ambulation on room air.  That came up to 100% on 2 L of O2 by nasal cannula.  Labs revealed mild hypokalemia of 3.3 and hypochloremia of 93, BUN of 28 and creatinine 1.33, BNP of 57.7 and high-sensitivity troponin I of 64 with procalcitonin less than 0.1.  CBC was unremarkable COVID-19 PCR came back positive and influenza antigens were negative.  Two-view chest x-ray showed mild perihilar and basilar groundglass opacities suspicious for bilateral pneumonia.  EKG showed normal sinus rhythm with rate of 94 with right bundle branch block and right ventricle hypertrophy with inverted T waves laterally  anteroseptally.  The patient was given IV remdesivir, 6 mg IV Decadron, 1 L bolus of IV lactated Ringer and 20 mcg p.o. potassium chloride.  He will be admitted to a medically monitored bed for further evaluation and management.  PAST MEDICAL HISTORY:   Past Medical History:  Diagnosis Date  . Anxiety   . Atrial flutter ([place])    "just dx'd today" (11/04/2013)  . Benign prostatic hypertrophy   . Borderline diabetes   . Colon, diverticulosis   . COPD (chronic obstructive pulmonary disease) ([place])   . Dysrhythmia   . Exertional shortness of breath   . Gout   . Hyperlipidemia   . Hypertension   . Osteoarthritis    "all over" (11/04/2013)  . PVD (peripheral vascular disease) ([place])     PAST SURGICAL HISTORY:   Past Surgical History:  Procedure Laterality Date  . albation  11/04/13  . ANKLE FRACTURE SURGERY Right   . APPENDECTOMY  1997   "days before 1st colon OR"  . ATRIAL FLUTTER ABLATION N/A 11/26/2013   Procedure: ATRIAL FLUTTER ABLATION;  Surgeon: Evans Lance, MD;  Location: [facility] CATH LAB;  Service: Cardiovascular;  Laterality: N/A;  . CATARACT EXTRACTION, BILATERAL Bilateral   . CHOLECYSTECTOMY  12/03  . COLON SURGERY     "diverticulitis"  . COLONOSCOPY WITH ESOPHAGOGASTRODUODENOSCOPY (EGD)  12/00   gastic polyp   . COLONOSCOPY WITH ESOPHAGOGASTRODUODENOSCOPY (EGD)  11/07,12/2011   gastritis, 2 gastic polyps  . COLOSTOMY  [place]     "think I wore it 4 months" (11/04/2013)  . CORNEAL TRANSPLANT Right   . CORONARY ANGIOGRAM  11/05/2013   Procedure: CORONARY ANGIOGRAM;  Surgeon: Wellington Hampshire, MD;  Location: [place] CATH LAB;  Service: Cardiovascular;;  . ESOPHAGOGASTRODUODENOSCOPY (EGD) WITH PROPOFOL N/A 08/13/2019   Procedure: ESOPHAGOGASTRODUODENOSCOPY (EGD) WITH PROPOFOL;  Surgeon: Robert Bellow, MD;  Location: [facility] ENDOSCOPY;  Service: Endoscopy;  Laterality: N/A;  . HERNIA REPAIR  11/05/14   ventral hernia  . INGUINAL HERNIA REPAIR Left   . PARTIAL  COLECTOMY  1997   "diverticulitis; busted; [place]"  . stress cardiolite negative  9/02   EF 60%  . TONSILLECTOMY AND ADENOIDECTOMY     child  . VENTRAL HERNIA REPAIR  11/07   [place]    SOCIAL HISTORY:   Social History   Tobacco Use  . Smoking status: Former Smoker    Packs/day: 3.00    Years: 30.00    Pack years: 90.00    Types: Cigarettes  . Smokeless tobacco: Former Systems developer    Types: Chew  . Tobacco comment: 11/04/2013 "quit smoking in the 1980's or so; quit chewing before I quit smoking"  Substance Use Topics  . Alcohol use: No    Comment: 11/04/2013 "quit driking in 1980's; was called a weekend alcoholic"    FAMILY HISTORY:   Family History  Problem Relation Age of Onset  . Coronary artery disease Sister   . Heart failure Mother     DRUG ALLERGIES:   Allergies  Allergen Reactions  . Codeine Nausea And Vomiting    REVIEW OF SYSTEMS:   ROS As per history of present illness. All pertinent systems were reviewed above. Constitutional, HEENT, cardiovascular, respiratory, GI, GU, musculoskeletal, neuro, psychiatric, endocrine, integumentary and hematologic systems were reviewed and are otherwise negative/unremarkable except for positive findings mentioned above in the HPI.   MEDICATIONS AT HOME:   Prior to Admission medications   Medication Sig Start Date End Date Taking? Authorizing Provider  albuterol (VENTOLIN HFA) 108 (90 Base) MCG/ACT inhaler Inhale 2 puffs into the lungs every 4 (four) hours as needed for shortness of breath or wheezing. 02/29/20   [provider]  allopurinol (ZYLOPRIM) 100 MG tablet TAKE 1 TABLET(100 MG) BY MOUTH DAILY 01/01/20   Bedsole, Amy E, MD  atorvastatin (LIPITOR) 20 MG tablet Take one table by mouth daily 01/01/20   Bedsole, Amy E, MD  Cyanocobalamin (B-12 PO) Take 1,000 mcg by mouth daily.     [provider]  fluorometholone (FML) 0.1 % ophthalmic suspension 1 drop 2 (two) times daily. 11/19/19   [provider]    hydrochlorothiazide (HYDRODIURIL) 25 MG tablet Take one tablet by mouth daily 01/01/20   Bedsole, Amy E, MD  meloxicam (MOBIC) 7.5 MG tablet Take 7.5 mg by mouth daily. 07/13/20   [provider]  methocarbamol (ROBAXIN) 500 MG tablet Take 500 mg by mouth 3 (three) times daily as needed. 07/13/20   [provider]  pregabalin (LYRICA) 50 MG capsule Take 1 capsule (50 mg total) by mouth 2 (two) times daily. Patient not taking: Reported on 07/29/2020 12/05/19   [name], MD      VITAL SIGNS:  Blood pressure 135/86, pulse 85, temperature 99.6 F (37.6 C), temperature source Oral, resp. rate 18, height _0  (1.676 m), weight 79.4 kg, SpO2 99 %.  PHYSICAL EXAMINATION:  Physical Exam  GENERAL:  86 y.o.-year-old patient lying in the bed with  mild respiratory distress with conversational dyspnea. EYES: Pupils equal, round, reactive to light and accommodation. No scleral icterus. Extraocular muscles intact.  HEENT: Head atraumatic, normocephalic. Oropharynx and nasopharynx clear.  NECK:  Supple, no jugular venous distention. No thyroid enlargement, no tenderness.  LUNGS: Diminished bibasal breath sounds with bibasal crackles. CARDIOVASCULAR: Regular rate and rhythm, S1, S2 normal. No murmurs, rubs, or gallops.  ABDOMEN: Soft, nondistended, nontender. Bowel sounds present. No organomegaly or mass.  EXTREMITIES: No pedal edema, cyanosis, or clubbing.  NEUROLOGIC: Cranial nerves II through XII are intact. Muscle strength 5/5 in all extremities. Sensation intact. Gait not checked.  PSYCHIATRIC: The patient is alert and oriented x 3.  Normal affect and good eye contact. SKIN: No obvious rash, lesion, or ulcer.   LABORATORY PANEL:   CBC Recent Labs  Lab 07/30/20 1629  WBC 7.8  HGB 15.3  HCT 45.7  PLT 210   ------------------------------------------------------------------------------------------------------------------  Chemistries  Recent Labs  Lab 07/30/20 1629  NA  135  K 3.3*  CL 93*  CO2 29  GLUCOSE 102*  BUN 28*  CREATININE 1.33*  CALCIUM 8.8*  AST 38  ALT 22  ALKPHOS 62  BILITOT 1.1   ------------------------------------------------------------------------------------------------------------------  Cardiac Enzymes No results for input(s): TROPONINI in the last 168 hours. ------------------------------------------------------------------------------------------------------------------  RADIOLOGY:  DG Chest 2 View  Result Date: 07/30/2020 CLINICAL DATA:  Chest pain EXAM: CHEST - 2 VIEW COMPARISON:  07/29/2020,, 01/25/2016 CT 01/28/2016 FINDINGS: Mild peripheral and basilar ground-glass opacity, slightly more conspicuous compared to prior. Stable cardiomediastinal silhouette. No pneumothorax. IMPRESSION: Mild peripheral and basilar ground-glass opacities suspicious for bilateral pneumonia, findings are more conspicuous compared to previous exam. Electronically Signed   By: Donavan Foil M.D.   On: 07/30/2020 17:22   DG Chest 2 View  Result Date: 07/29/2020 CLINICAL DATA:  Chest pain, cough. EXAM: CHEST - 2 VIEW COMPARISON:  January 25, 2016. FINDINGS: Stable cardiomediastinal silhouette. No pneumothorax or pleural effusion is noted. Stable interstitial densities are noted in the lungs which may represent scarring. No definite acute abnormality is noted. Bony thorax is unremarkable. IMPRESSION: No active cardiopulmonary disease. Electronically Signed   By: Marijo Conception M.D.   On: 07/29/2020 13:51      IMPRESSION AND PLAN:   1.  Acute hypoxemic respiratory failure secondary to COVID-19. -The patient will be admitted to a medically monitored isolation bed. -O2 protocol will be followed to keep O2 saturation above 93.   2.  Multifocal pneumonia secondary to COVID-19. -The patient will be admitted to an isolation monitored bed with droplet and contact precautions. -Given multifocal pneumonia we will empirically place the patient on IV  Rocephin and Zithromax for possible bacterial superinfection only with elevated Procalcitonin. -The patient will be placed on scheduled Mucinex and as needed Tussionex. -We will avoid nebulization as much as we can, give bronchodilator MDI if needed, and with deterioration of oxygenation try to avoid BiPAP/CPAP if possible.    -Will obtain sputum Gram stain culture and sensitivity and follow blood cultures. -O2 protocol will be followed. -We will follow CRP, ferritin, LDH and D-dimer. -Will follow manual differential for ANC/ALC ratio as well as follow troponin I and daily CBC with manual differential and CMP. - Will place the patient on IV Remdesivir and IV steroid therapy with IV Solu-Medrol with elevated inflammatory markers. -The patient will be placed on vitamin D3, vitamin C, zinc sulfate, p.o. Pepcid and aspirin. -I discussed Baricitinib and the patient agreed to proceed with it.  3.  Hypokalemia with stable stage IIIa chronic kidney disease. -Potassium will be replaced and magnesium level will be checked. -He will be hydrated with IV normal saline.  4.  Hypertension. -We will continue his HCTZ.  5.  Type 2 diabetes mellitus with peripheral neuropathy. -The patient will be placed on supplement coverage with NovoLog. -We will continue Lyrica.  6.  Dyslipidemia. -We will continue statin therapy.  7.  Gout. -Continue allopurinol.  8.  COPD. -We will continue as needed albuterol MDI.  9.  Vitamin B12 deficiency. -We will continue vitamin B12.  10.  DVT prophylaxis. -Subcutaneous Lovenox.   All the records are reviewed and case discussed with ED provider. The plan of care was discussed in details with the patient (and family). I answered all questions. The patient agreed to proceed with the above mentioned plan. Further management will depend upon hospital course.   CODE STATUS: Full code  Status is: Inpatient  Remains inpatient appropriate because:Hemodynamically  unstable, Ongoing diagnostic testing needed not appropriate for outpatient work up, Unsafe d/c plan, IV treatments appropriate due to intensity of illness or inability to take PO and Inpatient level of care appropriate due to severity of illness   Dispo: The patient is from: Home              Anticipated d/c is to: Home              Anticipated d/c date is: > 3 days              Patient currently is not medically stable to d/c.   TOTAL TIME TAKING CARE OF THIS PATIENT: 55 minutes.    Christel Mormon M.D on 07/30/2020 at 8:34 PM  [facility]   From 7 PM-7 AM, contact night-coverage [email or web address]  CC: Primary care physician; [name], MD

## 2020-07-30 NOTE — Consult Note (Signed)
Remdesivir - Pharmacy Brief Note   O:  ALT: 22 CXR: "Mild peripheral and basilar ground-glass opacities suspicious for bilateral pneumonia, findings are more conspicuous compared to previous exam" SpO2: Hypoxic requiring supplemental oxygen   A/P:  07/30/20 SARS-CoV-2 PCR +  Remdesivir 200 mg IVPB once followed by 100 mg IVPB daily x 4 days.   Caleb Bryant 07/30/2020 7:12 PM

## 2020-07-30 NOTE — Telephone Encounter (Signed)
Called to Discuss with patient about Covid symptoms and the use of the monoclonal antibody infusion for those with mild to moderate Covid symptoms and at a high risk of hospitalization.     Pt appears to qualify for this infusion due to co-morbid conditions and/or a member of an at-risk group in accordance with the FDA Emergency Use Authorization.    Mr. Appelt is still sleeping and his wife is on the phone with me. She discussed that he had low oxygen to mid 80s during Urgent Care Visit and stayed in the ER for a long time last night.  He was recommended hospitalization however the patient declined for unknown reasons.  His wife is not certain either.  She was hopeful he was an eligible candidate for the monoclonal antibody infusion.  I told her with his new onset hypoxemia that his symptoms are too severe and we are unable to offer treatment with monoclonal therapy.  I explained to her that this would likely worsen the patient's condition if given at this stage in the illness.  We discussed trying to wake him up soon to do a check on him.  Also discussed symptoms that would warrant EMS to be called to bring him back to the hospital for care.  She will contact his primary doctor as well to discuss further.  He was sent home with no oxygen and an inhaler.  I told her I would strongly reconsider having him back to the hospital to consider remdesivir and steroids to help prevent progression of disease.   Janene Madeira, MSN, NP-C [facility] [email or web address] Pager: [phone] Office: [phone] [place]: [phone]

## 2020-07-30 NOTE — Telephone Encounter (Signed)
Patient's wife called in stating she spoke with someone earlier and was told husband does not qualify for infusion. Patient's wife states she was told to bring him back to ER but wife states he will most likely refuse, as they waited from 12:00pm to 1:00am to be seen. Patient's wife is wondering if oxygen would be recommended or what else to do. Please advise.

## 2020-07-30 NOTE — ED Notes (Signed)
Dr. Charna Archer at bedside. Placed pt on nasal canula and on 2L

## 2020-07-30 NOTE — Telephone Encounter (Signed)
Patient's wife called stating that her husband went to the ER yesterday and needed to stay in the hospital but would not. Patient's wife stated that he wants Ivermectin and does not want to go back to the hospital. Mrs. Ells stated that her husband's oxygen level is too low for him to qualify for the infusion. Patient's wife stated that he is sitting in the chair and his oxygen level now is 90%. Patient got up and walked across the room and his oxygen level dropped to 85%. After speaking with Dr. Diona Browner patient was advised that he needs to be at the ER because he needs some oxygen. Patient stated that he does not want to go back to the ER. Patient was finally convinced that he needs to go to the ER and his wife stated that she will drive him back to [facility] now. Patient was advised that Dr. Diona Browner does not prescribe Ivermectin.

## 2020-07-30 NOTE — ED Notes (Signed)
Pt placed on 2L [place] at this time and O2 increased to 94%.

## 2020-07-30 NOTE — ED Triage Notes (Addendum)
Pt arrived to ED via [place] with wife. Pt c/o cough and increased SOB since testing covid+ last Monday.   Pt currently on 80-81% RA at this time. Pt has hx COPD.  Pt states PCP told him to come back to ED.

## 2020-07-30 NOTE — Telephone Encounter (Signed)
Reviewed.  Agree with ER dispo.

## 2020-07-30 NOTE — ED Provider Notes (Addendum)
[facility] Emergency Department Provider Note  ____________________________________________   First MD Initiated Contact with Patient 07/30/20 1859     (approximate)  I have reviewed the triage vital signs and the nursing notes.   HISTORY  Chief Complaint Shortness of Breath and covid+   HPI Caleb Lundberg Mcelhinney Sr. is a 86 y.o. male with a past medical history of anxiety, flutter status post ablation, COPD, HTN, HDL, gout, osteoarthritis, BPH and PVD who presents for assessment of worsening cough associated with shortness of breath and posttussive chest pain in the setting of recent COVID-19 infection and hypoxia.  Patient notes he was diagnosed with Covid yesterday and came to the ED after being prompted by his wife but left [place] as he states that one to be in the hospital.  Patient states he was told he needed admission for oxygen but did not want to stay.  He states he has been feeling bad for five or 6 days with some chest tightness, cough, shortness of breath, nausea, crampy abdominal pain, nonbloody diarrhea, chills, and malaise.  He denies any vomiting, chest pain when he is not coughing, hemoptysis, earache, sore throat, back pain, rash, burning with urination, blood in his urine, recent falls or injuries, or other acute complaints.  Denies any recent tobacco abuse, EtOH use, or illicit drug use.  He has not been vaccinated against COVID-19.  No clear alleviating or aggravating factors for patient's symptoms.         Past Medical History:  Diagnosis Date  . Anxiety   . Atrial flutter ([place])    "just dx'd today" (11/04/2013)  . Benign prostatic hypertrophy   . Borderline diabetes   . Colon, diverticulosis   . COPD (chronic obstructive pulmonary disease) ([place])   . Dysrhythmia   . Exertional shortness of breath   . Gout   . Hyperlipidemia   . Hypertension   . Osteoarthritis    "all over" (11/04/2013)  . PVD (peripheral vascular  disease) [facility])     Patient Active Problem List   Diagnosis Date Noted  . GERD (gastroesophageal reflux disease) 10/17/2016  . CKD stage 3 due to type 2 diabetes mellitus ([place]) 10/04/2016  . Idiopathic pulmonary fibrosis ([place]) 02/25/2016  . Thoracic aortic aneurysm without rupture ([place]) 02/01/2016  . Chronic cough 01/25/2016  . Midline thoracic back pain 10/19/2015  . Advanced directives, counseling/discussion 10/14/2015  . History of atrial flutter 12/21/2014  . Ventral hernia without obstruction or gangrene 10/02/2014  . Chronic systolic CHF (congestive heart failure) ([place]) 12/23/2013  . Intermediate coronary syndrome ([place]) 11/04/2013  . 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  . DDD (degenerative disc disease), cervical 03/31/2013  . Fuchs' corneal dystrophy 02/19/2013  . Neuropathy due to secondary diabetes mellitus ([place]) 12/31/2012  . 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 DM (diabetes mellitus) with peripheral vascular complication ([place]) 74/16/3845  . Shoulder pain, bilateral 11/06/2011  . Vitamin B12 deficiency 03/11/2009  . INTERMITTENT VERTIGO 03/11/2009  . Mild dementia ([place]) 09/01/2008  . HYPERCHOLESTEROLEMIA 11/29/2007  . ALLERGIC RHINITIS 11/29/2007  . HYPERTENSION, BENIGN ESSENTIAL 11/14/2007  . OSTEOARTHRITIS 06/13/2007  . Peripheral vascular disease due to secondary diabetes mellitus ([place]) 06/12/2007  . DIVERTICULOSIS, COLON 06/12/2007  . BENIGN PROSTATIC HYPERTROPHY 06/12/2007    Past Surgical History:  Procedure Laterality Date  . albation  11/04/13  . ANKLE FRACTURE SURGERY Right   . APPENDECTOMY  1997   "days before 1st colon OR"  . ATRIAL FLUTTER ABLATION N/A 11/26/2013  Procedure: ATRIAL FLUTTER ABLATION;  Surgeon: Evans Lance, MD;  Location: [facility] CATH LAB;  Service: Cardiovascular;  Laterality: N/A;  . CATARACT EXTRACTION, BILATERAL Bilateral   . CHOLECYSTECTOMY  12/03  . COLON SURGERY     "diverticulitis"  . COLONOSCOPY WITH ESOPHAGOGASTRODUODENOSCOPY  (EGD)  12/00   gastic polyp   . COLONOSCOPY WITH ESOPHAGOGASTRODUODENOSCOPY (EGD)  11/07,12/2011   gastritis, 2 gastic polyps  . COLOSTOMY  1997  . COLOSTOMY REVERSAL     "think I wore it 4 months" (11/04/2013)  . CORNEAL TRANSPLANT Right   . CORONARY ANGIOGRAM  11/05/2013   Procedure: CORONARY ANGIOGRAM;  Surgeon: Wellington Hampshire, MD;  Location: [facility] CATH LAB;  Service: Cardiovascular;;  . ESOPHAGOGASTRODUODENOSCOPY (EGD) WITH PROPOFOL N/A 08/13/2019   Procedure: ESOPHAGOGASTRODUODENOSCOPY (EGD) WITH PROPOFOL;  Surgeon: Robert Bellow, MD;  Location: [facility] ENDOSCOPY;  Service: Endoscopy;  Laterality: N/A;  . HERNIA REPAIR  11/05/14   ventral hernia  . INGUINAL HERNIA REPAIR Left   . PARTIAL COLECTOMY  1997   "diverticulitis; busted; [place]"  . stress cardiolite negative  9/02   EF 60%  . TONSILLECTOMY AND ADENOIDECTOMY     child  . VENTRAL HERNIA REPAIR  11/07   [place]    Prior to Admission medications   Medication Sig Start Date End Date Taking? Authorizing Provider  albuterol (VENTOLIN HFA) 108 (90 Base) MCG/ACT inhaler Inhale 2 puffs into the lungs every 4 (four) hours as needed for shortness of breath or wheezing. 02/29/20   [provider]  allopurinol (ZYLOPRIM) 100 MG tablet TAKE 1 TABLET(100 MG) BY MOUTH DAILY 01/01/20   Bedsole, Amy E, MD  atorvastatin (LIPITOR) 20 MG tablet Take one table by mouth daily 01/01/20   Bedsole, Amy E, MD  Cyanocobalamin (B-12 PO) Take 1,000 mcg by mouth daily.     [provider]  fluorometholone (FML) 0.1 % ophthalmic suspension 1 drop 2 (two) times daily. 11/19/19   [provider]  hydrochlorothiazide (HYDRODIURIL) 25 MG tablet Take one tablet by mouth daily 01/01/20   Bedsole, Amy E, MD  meloxicam (MOBIC) 7.5 MG tablet Take 7.5 mg by mouth daily. 07/13/20   [provider]  methocarbamol (ROBAXIN) 500 MG tablet Take 500 mg by mouth 3 (three) times daily as needed. 07/13/20   [provider]  pregabalin  (LYRICA) 50 MG capsule Take 1 capsule (50 mg total) by mouth 2 (two) times daily. Patient not taking: Reported on 07/29/2020 12/05/19   [name], MD    Allergies Codeine  Family History  Problem Relation Age of Onset  . Coronary artery disease Sister   . Heart failure Mother     Social History Social History   Tobacco Use  . Smoking status: Former Smoker    Packs/day: 3.00    Years: 30.00    Pack years: 90.00    Types: Cigarettes  . Smokeless tobacco: Former Systems developer    Types: Chew  . Tobacco comment: 11/04/2013 "quit smoking in the 1980's or so; quit chewing before I quit smoking"  Vaping Use  . Vaping Use: Never used  Substance Use Topics  . Alcohol use: No    Comment: 11/04/2013 "quit driking in 1980's; was called a weekend alcoholic"  . Drug use: No    Review of Systems  Review of Systems  Constitutional: Positive for chills and malaise/fatigue. Negative for fever.  HENT: Negative for hearing loss and sore throat.   Eyes: Negative for pain.  Respiratory: Positive for cough  and shortness of breath. Negative for stridor.   Cardiovascular: Positive for chest pain.  Gastrointestinal: Positive for abdominal pain, diarrhea and nausea. Negative for vomiting.  Genitourinary: Negative for dysuria.  Musculoskeletal: Positive for myalgias.  Skin: Negative for rash.  Neurological: Negative for seizures, loss of consciousness and headaches.  Psychiatric/Behavioral: Negative for suicidal ideas.  All other systems reviewed and are negative.     ____________________________________________   PHYSICAL EXAM:  VITAL SIGNS: ED Triage Vitals  Enc Vitals Group     BP 07/30/20 1622 120/68     Pulse Rate 07/30/20 1622 95     Resp 07/30/20 1622 18     Temp 07/30/20 1622 99.6 F (37.6 C)     Temp Source 07/30/20 1622 Oral     SpO2 07/30/20 1622 (!) 80 %     Weight 07/30/20 1624 175 lb (79.4 kg)     Height 07/30/20 1624 _0  (1.676 m)     Head Circumference --      Peak Flow  --      Pain Score 07/30/20 1623 6     Pain Loc --      Pain Edu? --      Excl. in [place]? --    Vitals:   07/30/20 1622  BP: 120/68  Pulse: 95  Resp: 18  Temp: 99.6 F (37.6 C)  SpO2: (!) 80%   Physical Exam Vitals and nursing note reviewed.  Constitutional:      General: He is not in acute distress.    Appearance: He is well-developed.  HENT:     Head: Normocephalic and atraumatic.     Right Ear: External ear normal.     Left Ear: External ear normal.     Nose: Nose normal.     Mouth/Throat:     Mouth: Mucous membranes are moist.  Eyes:     Conjunctiva/sclera: Conjunctivae normal.  Cardiovascular:     Rate and Rhythm: Normal rate and regular rhythm.     Heart sounds: No murmur heard.   Pulmonary:     Effort: Pulmonary effort is normal. No respiratory distress.     Breath sounds: Decreased breath sounds present.  Abdominal:     Palpations: Abdomen is soft.     Tenderness: There is no abdominal tenderness.  Musculoskeletal:     Cervical back: Neck supple. No rigidity.     Right lower leg: No edema.     Left lower leg: No edema.  Skin:    General: Skin is warm and dry.     Capillary Refill: Capillary refill takes less than 2 seconds.  Neurological:     Mental Status: He is alert and oriented to person, place, and time.  Psychiatric:        Mood and Affect: Mood normal.      ____________________________________________   LABS (all labs ordered are listed, but only abnormal results are displayed)  Labs Reviewed  BASIC METABOLIC PANEL - Abnormal; Notable for the following components:      Result Value   Potassium 3.3 (*)    Chloride 93 (*)    Glucose, Bld 102 (*)    BUN 28 (*)    Creatinine, Ser 1.33 (*)    Calcium 8.8 (*)    GFR calc non Af Amer 49 (*)    GFR calc Af Amer 56 (*)    All other components within normal limits  TROPONIN I (HIGH SENSITIVITY) - Abnormal; Notable for the following components:  Troponin I (High Sensitivity) 64 (*)    All  other components within normal limits  CBC  HEPATIC FUNCTION PANEL  BRAIN NATRIURETIC PEPTIDE  PROCALCITONIN  FIBRIN DERIVATIVES D-DIMER ([facility] ONLY)  MAGNESIUM  TROPONIN I (HIGH SENSITIVITY)   ____________________________________________  EKG  Sinus rhythm with a ventricular rate of ninety-four, bundle branch block, nonspecific ST changes in the inferior and anterior leads that appear present on EKG obtained yesterday.  T wave inversions in V2 are slightly more pronounced when compared to prior. ____________________________________________  RADIOLOGY  ED MD interpretation: Bilateral pneumonia without significant effusion, pneumothorax, or other acute in thoracic process.  Official radiology report(s): DG Chest 2 View  Result Date: 07/30/2020 CLINICAL DATA:  Chest pain EXAM: CHEST - 2 VIEW COMPARISON:  07/29/2020,, 01/25/2016 CT 01/28/2016 FINDINGS: Mild peripheral and basilar ground-glass opacity, slightly more conspicuous compared to prior. Stable cardiomediastinal silhouette. No pneumothorax. IMPRESSION: Mild peripheral and basilar ground-glass opacities suspicious for bilateral pneumonia, findings are more conspicuous compared to previous exam. Electronically Signed   By: Donavan Foil M.D.   On: 07/30/2020 17:22    ____________________________________________   PROCEDURES  Procedure(s) performed (including Critical Care):  .Critical Care Performed by: Lucrezia Starch, MD Authorized by: Lucrezia Starch, MD   Critical care provider statement:    Critical care time (minutes):  45   Critical care was necessary to treat or prevent imminent or life-threatening deterioration of the following conditions:  Respiratory failure   Critical care was time spent personally by me on the following activities:  Discussions with consultants, evaluation of patient's response to treatment, examination of patient, ordering and performing treatments and interventions, ordering and review of  laboratory studies, ordering and review of radiographic studies, pulse oximetry, re-evaluation of patient's condition, obtaining history from patient or surrogate and review of old charts     ____________________________________________   INITIAL IMPRESSION / [place] / ED COURSE        Patient presents with [place] to history exam for assessment of worsening respiratory symptoms as well as GI symptoms in the setting of known COVID-19 infection and recent discharge [place] after presenting yesterday and being found to be hypoxic requiring 2 L.  Today patient presents with an SPO2 of 80% on room air which improved to the low 90s on 3 L nasal cannula.  Is otherwise hemodynamically stable.  Exam as above.  Overall impression is likely acute toxic respiratory failure secondary to COVID-19 pneumonia.  While patient's troponins are slightly elevated today they are stable when compared to yesterday given absence of acute chest pain likely secondary to demand ischemia in the setting of COVID-19 infection have a low suspicion for coronary thrombosis at this time.  Will obtain D-dimer to assess risk for PE.  Low suspicion for superinfection with bacterial pneumonia as patient has no fever, elevation of his white blood cell count, and procalcitonin is less than 0.1.  Labs remarkable for mildly elevated creatinine at 1.4 compared to 1.28 obtained yesterday.  No other significant derangements noted.  BMP is within normal limits and overall patient does not appear volume overloaded.  Given concern for acute hypoxic respiratory failure secondary to COVID-19 will order Decadron and remdesivir.  Will plan to admit to hospital service for further evaluation management.   ____________________________________________   FINAL CLINICAL IMPRESSION(S) / ED DIAGNOSES  Final diagnoses:  Acute respiratory failure with hypoxia ([place])  Pneumonia due to COVID-19 virus  Hypokalemia  Dehydration     Medications  dexamethasone (DECADRON) injection 6 mg (has no administration in time range)  remdesivir 200 mg in sodium chloride 0.9% 250 mL IVPB (has no administration in time range)    Followed by  remdesivir 100 mg in sodium chloride 0.9 % 100 mL IVPB (has no administration in time range)  lactated ringers bolus 1,000 mL (has no administration in time range)  potassium chloride SA (KLOR-CON) CR tablet 40 mEq (has no administration in time range)     ED Discharge Orders    None       Note:  This document was prepared using Dragon voice recognition software and may include unintentional dictation errors.   Lucrezia Starch, MD 07/30/20 Lattie Corns    Lucrezia Starch, MD 07/30/20 2000

## 2020-07-31 DIAGNOSIS — E1169 Type 2 diabetes mellitus with other specified complication: Secondary | ICD-10-CM

## 2020-07-31 DIAGNOSIS — J1282 Pneumonia due to coronavirus disease 2019: Secondary | ICD-10-CM

## 2020-07-31 DIAGNOSIS — E876 Hypokalemia: Secondary | ICD-10-CM

## 2020-07-31 DIAGNOSIS — U071 COVID-19: Principal | ICD-10-CM

## 2020-07-31 DIAGNOSIS — J9601 Acute respiratory failure with hypoxia: Secondary | ICD-10-CM

## 2020-07-31 DIAGNOSIS — N1831 Chronic kidney disease, stage 3a: Secondary | ICD-10-CM

## 2020-07-31 DIAGNOSIS — E785 Hyperlipidemia, unspecified: Secondary | ICD-10-CM

## 2020-07-31 LAB — COMPREHENSIVE METABOLIC PANEL
ALT: 21 U/L (ref 0–44)
AST: 33 U/L (ref 15–41)
Albumin: 3.2 g/dL — ABNORMAL LOW (ref 3.5–5.0)
Alkaline Phosphatase: 54 U/L (ref 38–126)
Anion gap: 10 (ref 5–15)
BUN: 27 mg/dL — ABNORMAL HIGH (ref 8–23)
CO2: 25 mmol/L (ref 22–32)
Calcium: 8.5 mg/dL — ABNORMAL LOW (ref 8.9–10.3)
Chloride: 101 mmol/L (ref 98–111)
Creatinine, Ser: 1.24 mg/dL (ref 0.61–1.24)
GFR calc Af Amer: >60 mL/min (ref >60)
GFR calc non Af Amer: 53 mL/min — ABNORMAL LOW (ref >60)
Glucose, Bld: 147 mg/dL — ABNORMAL HIGH (ref 70–99)
Potassium: 4 mmol/L (ref 3.5–5.1)
Sodium: 136 mmol/L (ref 135–145)
Total Bilirubin: 0.8 mg/dL (ref 0.3–1.2)
Total Protein: 6.5 g/dL (ref 6.5–8.1)

## 2020-07-31 LAB — CBC WITH DIFFERENTIAL/PLATELET
Abs Immature Granulocytes: 0.01 10*3/uL (ref 0.00–0.07)
Basophils Absolute: 0 10*3/uL (ref 0.0–0.1)
Basophils Relative: 0 %
Eosinophils Absolute: 0 10*3/uL (ref 0.0–0.5)
Eosinophils Relative: 0 %
HCT: 42.9 % (ref 39.0–52.0)
Hemoglobin: 15.1 g/dL (ref 13.0–17.0)
Immature Granulocytes: 0 %
Lymphocytes Relative: 23 %
Lymphs Abs: 0.7 10*3/uL (ref 0.7–4.0)
MCH: 31.7 pg (ref 26.0–34.0)
MCHC: 35.2 g/dL (ref 30.0–36.0)
MCV: 90.1 fL (ref 80.0–100.0)
Monocytes Absolute: 0.2 10*3/uL (ref 0.1–1.0)
Monocytes Relative: 8 %
Neutro Abs: 2.2 10*3/uL (ref 1.7–7.7)
Neutrophils Relative %: 69 %
Platelets: 172 10*3/uL (ref 150–400)
RBC: 4.76 MIL/uL (ref 4.22–5.81)
RDW: 13.5 % (ref 11.5–15.5)
WBC: 3.2 10*3/uL — ABNORMAL LOW (ref 4.0–10.5)
nRBC: 0 % (ref 0.0–0.2)

## 2020-07-31 LAB — HEMOGLOBIN A1C
Hgb A1c MFr Bld: 6.5 % — ABNORMAL HIGH (ref 4.8–5.6)
Mean Plasma Glucose: 139.85 mg/dL

## 2020-07-31 LAB — C-REACTIVE PROTEIN
CRP: 5.2 mg/dL — ABNORMAL HIGH (ref <1.0)
CRP: 5.6 mg/dL — ABNORMAL HIGH (ref <1.0)

## 2020-07-31 LAB — GLUCOSE, CAPILLARY
Glucose-Capillary: 152 mg/dL — ABNORMAL HIGH (ref 70–99)
Glucose-Capillary: 145 mg/dL — ABNORMAL HIGH (ref 70–99)
Glucose-Capillary: 157 mg/dL — ABNORMAL HIGH (ref 70–99)
Glucose-Capillary: 132 mg/dL — ABNORMAL HIGH (ref 70–99)

## 2020-07-31 LAB — FIBRIN DERIVATIVES D-DIMER (ARMC ONLY): Fibrin derivatives D-dimer (ARMC): 753.2 ng/mL (FEU) — ABNORMAL HIGH (ref 0.00–499.00)

## 2020-07-31 LAB — FERRITIN: Ferritin: 173 ng/mL (ref 24–336)

## 2020-07-31 MED ORDER — METHOCARBAMOL 500 MG PO TABS
500.0000 mg | ORAL_TABLET | Freq: Three times a day (TID) | ORAL | Status: DC | PRN
  Filled 2020-07-31: qty 1

## 2020-07-31 MED ORDER — MAGNESIUM SULFATE 2 GM/50ML IV SOLN
2.0000 g | Freq: Once | INTRAVENOUS | Status: AC
  Administered 2020-07-31: 09:00:00 2 g via INTRAVENOUS
  Filled 2020-07-31: qty 50

## 2020-07-31 MED ORDER — ORAL CARE MOUTH RINSE
15.0000 mL | Freq: Two times a day (BID) | OROMUCOSAL | Status: DC
  Administered 2020-07-31 – 2020-08-02 (×6): 15 mL via OROMUCOSAL

## 2020-07-31 NOTE — Progress Notes (Signed)
Patient ID: Caleb Assefa Sr., male   DOB: 1934/05/26, 86 y.o.   MRN: [id number] [facility] PROGRESS NOTE  Caleb Matton Menands Sr. FIE:[id number] DOB: [birth year] DOA: 07/30/2020 PCP: [name], MD  HPI/Subjective: Patient feeling better.  Little bit of cough and a little bit of shortness of breath.  Admitted with COVID-19 infection and had low pulse ox in the emergency room.  Objective: Vitals:   07/31/20 0906 07/31/20 1141  BP: 129/84 (!) 129/91  Pulse: 82 68  Resp: 20 20  Temp: 97.7 F (36.5 C) 97.8 F (36.6 C)  SpO2: 98% 98%    Intake/Output Summary (Last 24 hours) at 07/31/2020 1234 Last data filed at 07/31/2020 1040 Gross per 24 hour  Intake 913.84 ml  Output --  Net 913.84 ml   Filed Weights   07/30/20 1624 07/30/20 2145  Weight: 79.4 kg 79.8 kg    ROS: Review of Systems  Respiratory: Positive for cough and shortness of breath.   Cardiovascular: Negative for chest pain.  Gastrointestinal: Negative for abdominal pain, nausea and vomiting.   Exam: Physical Exam HENT:     Nose: No mucosal edema.     Mouth/Throat:     Pharynx: No oropharyngeal exudate.  Eyes:     General: Lids are normal.     Conjunctiva/sclera: Conjunctivae normal.     Pupils: Pupils are equal, round, and reactive to light.  Cardiovascular:     Rate and Rhythm: Normal rate and regular rhythm.     Heart sounds: Normal heart sounds, S1 normal and S2 normal.  Pulmonary:     Breath sounds: Examination of the right-lower field reveals decreased breath sounds. Examination of the left-lower field reveals decreased breath sounds. Decreased breath sounds present. No wheezing, rhonchi or rales.  Abdominal:     General: Abdomen is flat.     Palpations: Abdomen is soft.     Tenderness: There is no abdominal tenderness.  Musculoskeletal:     Right lower leg: No swelling.     Left lower leg: No swelling.  Skin:    General: Skin is warm.     Findings: No rash.  Neurological:     Mental  Status: He is alert and oriented to person, place, and time.       Data Reviewed: Basic Metabolic Panel: Recent Labs  Lab 07/29/20 1338 07/30/20 1629 07/30/20 1957 07/30/20 2110 07/31/20 0726  NA 135 135  --   --  136  K 4.0 3.3*  --   --  4.0  CL 96* 93*  --   --  101  CO2 26 29  --   --  25  GLUCOSE 108* 102*  --   --  147*  BUN 22 28*  --   --  27*  CREATININE 1.41* 1.33*  --   --  1.24  CALCIUM 9.0 8.8*  --   --  8.5*  MG  --   --  1.0* 1.6*  --    Liver Function Tests: Recent Labs  Lab 07/30/20 1629 07/31/20 0726  AST 38 33  ALT 22 21  ALKPHOS 62 54  BILITOT 1.1 0.8  PROT 7.9 6.5  ALBUMIN 3.9 3.2*   CBC: Recent Labs  Lab 07/29/20 1338 07/30/20 1629 07/31/20 0726  WBC 7.6 7.8 3.2*  NEUTROABS  --   --  2.2  HGB 15.7 15.3 15.1  HCT 45.2 45.7 42.9  MCV 91.9 94.0 90.1  PLT 200 210 172   BNP (  last 3 results) Recent Labs    07/30/20 1629 07/30/20 2110  BNP 57.7 64.0    CBG: Recent Labs  Lab 07/31/20 0825 07/31/20 1139  GLUCAP 152* 145*    Recent Results (from the past 240 hour(s))  Respiratory Panel by RT PCR (Flu A&B, Covid) - Nasopharyngeal Swab     Status: Abnormal   Collection Time: 07/30/20 12:40 AM   Specimen: Nasopharyngeal Swab  Result Value Ref Range Status   SARS Coronavirus 2 by RT PCR POSITIVE (A) NEGATIVE Final    Comment: RESULT CALLED TO, READ BACK BY AND VERIFIED WITH:  STEPHEN JONES AT 0330 07/30/20 RH/SDR (NOTE) SARS-CoV-2 target nucleic acids are DETECTED.  SARS-CoV-2 RNA is generally detectable in upper respiratory specimens  during the acute phase of infection. Positive results are indicative of the presence of the identified virus, but do not rule out bacterial infection or co-infection with other pathogens not detected by the test. Clinical correlation with patient history and other diagnostic information is necessary to determine patient infection status. The expected result is Negative.  Fact Sheet for  Patients:  PinkCheek.be  Fact Sheet for Healthcare Providers: GravelBags.it  This test is not yet approved or cleared by the [place] FDA and  has been authorized for detection and/or diagnosis of SARS-CoV-2 by FDA under an Emergency Use Authorization (EUA).  This EUA will remain in effect (meaning this test can  be used) for the duration of  the COVID-19 declaration under Section 564(b)(1) of the Act, 21 U.S.C. section 360bbb-3(b)(1), unless the authorization is terminated or revoked sooner.      Influenza A by PCR NEGATIVE NEGATIVE Final   Influenza B by PCR NEGATIVE NEGATIVE Final    Comment: (NOTE) The Xpert Xpress SARS-CoV-2/FLU/RSV assay is intended as an aid in  the diagnosis of influenza from Nasopharyngeal swab specimens and  should not be used as a sole basis for treatment. Nasal washings and  aspirates are unacceptable for Xpert Xpress SARS-CoV-2/FLU/RSV  testing.  Fact Sheet for Patients: PinkCheek.be  Fact Sheet for Healthcare Providers: GravelBags.it  This test is not yet approved or cleared by the [place] FDA and  has been authorized for detection and/or diagnosis of SARS-CoV-2 by  FDA under an Emergency Use Authorization (EUA). This EUA will remain  in effect (meaning this test can be used) for the duration of the  Covid-19 declaration under Section 564(b)(1) of the Act, 21  U.S.C. section 360bbb-3(b)(1), unless the authorization is  terminated or revoked. Performed at [facility], [address] [place], [place] [address]      Studies: DG Chest 2 View  Result Date: 07/30/2020 CLINICAL DATA:  Chest pain EXAM: CHEST - 2 VIEW COMPARISON:  07/29/2020,, 01/25/2016 CT 01/28/2016 FINDINGS: Mild peripheral and basilar ground-glass opacity, slightly more conspicuous compared to prior. Stable cardiomediastinal silhouette. No  pneumothorax. IMPRESSION: Mild peripheral and basilar ground-glass opacities suspicious for bilateral pneumonia, findings are more conspicuous compared to previous exam. Electronically Signed   By: Donavan Foil M.D.   On: 07/30/2020 17:22   DG Chest 2 View  Result Date: 07/29/2020 CLINICAL DATA:  Chest pain, cough. EXAM: CHEST - 2 VIEW COMPARISON:  January 25, 2016. FINDINGS: Stable cardiomediastinal silhouette. No pneumothorax or pleural effusion is noted. Stable interstitial densities are noted in the lungs which may represent scarring. No definite acute abnormality is noted. Bony thorax is unremarkable. IMPRESSION: No active cardiopulmonary disease. Electronically Signed   By: Marijo Conception M.D.   On:  07/29/2020 13:51    Scheduled Meds: . allopurinol  100 mg Oral Daily  . vitamin C  500 mg Oral Daily  . aspirin EC  81 mg Oral Daily  . atorvastatin  20 mg Oral q1800  . baricitinib  2 mg Oral Daily  . cholecalciferol  1,000 Units Oral Daily  . enoxaparin (LOVENOX) injection  40 mg Subcutaneous Q24H  . famotidine  20 mg Oral Daily  . guaiFENesin  600 mg Oral BID  . hydrochlorothiazide  25 mg Oral Daily  . insulin aspart  0-15 Units Subcutaneous TID PC & HS  . mouth rinse  15 mL Mouth Rinse BID  . methylPREDNISolone (SOLU-MEDROL) injection  80 mg Intravenous Q12H   Followed by  . [START ON 08/03/2020] predniSONE  50 mg Oral Daily  . vitamin B-12  1,000 mcg Oral Daily  . zinc sulfate  220 mg Oral Daily   Continuous Infusions: . remdesivir 100 mg in NS 100 mL 100 mg (07/31/20 1044)    Assessment/Plan:  1. Acute hypoxic respiratory failure with pulse ox of 80% on room air.  Patient now off oxygen.  Check a pulse ox with ambulation today. 2. COVID-19 multifocal pneumonia on remdesivir day 2.  Continue zinc vitamin C and albuterol inhaler.  Looks like admitting physician put on baricitinib. 3. Chronic kidney disease stage IIIa.  Discontinue IV fluids 4. Essential hypertension on  hydrochlorothiazide 5. Type 2 diabetes with hyperlipidemia unspecified on atorvastatin.  Continue sliding scale insulin while on steroids 6. Hypokalemia on presentation likely secondary to hydrochlorothiazide      Code Status:     Code Status Orders  (From admission, onward)         Start     Ordered   07/30/20 2015  Full code  Continuous        07/30/20 2026        Code Status History    Date Active Date Inactive Code Status Order ID Comments User Context   11/26/2013 1401 11/27/2013 1313 Full Code [id number]  Evans Lance, MD Inpatient   11/04/2013 2235 11/06/2013 1522 Full Code [id number]  Barrett, Evelene Croon, PA-C Inpatient   Advance Care Planning Activity     Family Communication: Left message for wife on the home phone and cell phone Disposition Plan: Status is: Inpatient  Dispo: The patient is from: Home              Anticipated d/c is to: Home              Anticipated d/c date is: We will take it day by day on day 2 of 5 of remdesivir.  Patient hesitant on outpatient remdesivir.  We will talk more tomorrow with him about that.              Patient currently being treated for acute hypoxic respiratory failure and COVID-19 pneumonia  Time spent: 28 minutes  [place]

## 2020-07-31 NOTE — Progress Notes (Signed)
Patient O2 sats on RA at rest 87%.  2L O2 per [place] reapplied while in bed sats up to mid 90's.  Pt ambulated around the unit with 2L O2 with sats 88%-90%.

## 2020-08-01 DIAGNOSIS — N182 Chronic kidney disease, stage 2 (mild): Secondary | ICD-10-CM

## 2020-08-01 DIAGNOSIS — I10 Essential (primary) hypertension: Secondary | ICD-10-CM

## 2020-08-01 LAB — CBC WITH DIFFERENTIAL/PLATELET
Abs Immature Granulocytes: 0.01 10*3/uL (ref 0.00–0.07)
Basophils Absolute: 0 10*3/uL (ref 0.0–0.1)
Basophils Relative: 0 %
Eosinophils Absolute: 0 10*3/uL (ref 0.0–0.5)
Eosinophils Relative: 0 %
HCT: 42.7 % (ref 39.0–52.0)
Hemoglobin: 14.9 g/dL (ref 13.0–17.0)
Immature Granulocytes: 0 %
Lymphocytes Relative: 17 %
Lymphs Abs: 1 10*3/uL (ref 0.7–4.0)
MCH: 31.9 pg (ref 26.0–34.0)
MCHC: 34.9 g/dL (ref 30.0–36.0)
MCV: 91.4 fL (ref 80.0–100.0)
Monocytes Absolute: 0.3 10*3/uL (ref 0.1–1.0)
Monocytes Relative: 5 %
Neutro Abs: 4.3 10*3/uL (ref 1.7–7.7)
Neutrophils Relative %: 78 %
Platelets: 182 10*3/uL (ref 150–400)
RBC: 4.67 MIL/uL (ref 4.22–5.81)
RDW: 13.2 % (ref 11.5–15.5)
WBC: 5.6 10*3/uL (ref 4.0–10.5)
nRBC: 0 % (ref 0.0–0.2)

## 2020-08-01 LAB — COMPREHENSIVE METABOLIC PANEL
ALT: 23 U/L (ref 0–44)
AST: 38 U/L (ref 15–41)
Albumin: 3.4 g/dL — ABNORMAL LOW (ref 3.5–5.0)
Alkaline Phosphatase: 57 U/L (ref 38–126)
Anion gap: 10 (ref 5–15)
BUN: 34 mg/dL — ABNORMAL HIGH (ref 8–23)
CO2: 26 mmol/L (ref 22–32)
Calcium: 8.9 mg/dL (ref 8.9–10.3)
Chloride: 101 mmol/L (ref 98–111)
Creatinine, Ser: 1.12 mg/dL (ref 0.61–1.24)
GFR calc Af Amer: >60 mL/min (ref >60)
GFR calc non Af Amer: 60 mL/min — ABNORMAL LOW (ref >60)
Glucose, Bld: 151 mg/dL — ABNORMAL HIGH (ref 70–99)
Potassium: 3.6 mmol/L (ref 3.5–5.1)
Sodium: 137 mmol/L (ref 135–145)
Total Bilirubin: 0.8 mg/dL (ref 0.3–1.2)
Total Protein: 6.9 g/dL (ref 6.5–8.1)

## 2020-08-01 LAB — C-REACTIVE PROTEIN: CRP: 3.6 mg/dL — ABNORMAL HIGH (ref <1.0)

## 2020-08-01 LAB — GLUCOSE, CAPILLARY
Glucose-Capillary: 152 mg/dL — ABNORMAL HIGH (ref 70–99)
Glucose-Capillary: 160 mg/dL — ABNORMAL HIGH (ref 70–99)
Glucose-Capillary: 123 mg/dL — ABNORMAL HIGH (ref 70–99)
Glucose-Capillary: 127 mg/dL — ABNORMAL HIGH (ref 70–99)

## 2020-08-01 LAB — FERRITIN: Ferritin: 238 ng/mL (ref 24–336)

## 2020-08-01 LAB — FIBRIN DERIVATIVES D-DIMER (ARMC ONLY): Fibrin derivatives D-dimer (ARMC): 807.64 ng/mL (FEU) — ABNORMAL HIGH (ref 0.00–499.00)

## 2020-08-01 NOTE — Progress Notes (Signed)
SATURATION QUALIFICATIONS: (This note is used to comply with regulatory documentation for home oxygen)  Patient Saturations on Room Air at Rest = 95%  Patient Saturations on Room Air while Ambulating = 90%  Patient Saturations on 0 Liters of oxygen while Ambulating = 93%  Please briefly explain why patient needs home oxygen:

## 2020-08-01 NOTE — Progress Notes (Signed)
Patient ID: Caleb Mojica Sr., male   DOB: 1934-09-25, 86 y.o.   MRN: [id number] [facility] PROGRESS NOTE  Caleb Khim [place] Sr. OEU:[id number] DOB: Jul 05, 1934 DOA: 07/30/2020 PCP: [name], MD  HPI/Subjective: Patient stated he had some diarrhea.  States he is doing okay with regards to his breathing still with some shortness of breath and cough.  Objective: Vitals:   08/01/20 0753 08/01/20 1249  BP: (!) 135/95 136/85  Pulse: 73 90  Resp: 16 16  Temp: 97.8 F (36.6 C) (!) 97.5 F (36.4 C)  SpO2: 96% 91%    Intake/Output Summary (Last 24 hours) at 08/01/2020 1303 Last data filed at 07/31/2020 1559 Gross per 24 hour  Intake 107.73 ml  Output --  Net 107.73 ml   Filed Weights   07/30/20 1624 07/30/20 2145  Weight: 79.4 kg 79.8 kg    ROS: Review of Systems  Respiratory: Positive for cough and shortness of breath.   Cardiovascular: Negative for chest pain.  Gastrointestinal: Positive for diarrhea. Negative for abdominal pain, nausea and vomiting.   Exam: Physical Exam HENT:     Head: Normocephalic.     Mouth/Throat:     Pharynx: No oropharyngeal exudate.  Eyes:     General: Lids are normal.     Conjunctiva/sclera: Conjunctivae normal.  Cardiovascular:     Rate and Rhythm: Normal rate and regular rhythm.     Heart sounds: Normal heart sounds, S1 normal and S2 normal.  Pulmonary:     Breath sounds: Examination of the right-lower field reveals decreased breath sounds. Examination of the left-lower field reveals decreased breath sounds. Decreased breath sounds present. No wheezing, rhonchi or rales.  Abdominal:     Palpations: Abdomen is soft.     Tenderness: There is no abdominal tenderness.  Musculoskeletal:     Right lower leg: No swelling.     Left lower leg: No swelling.  Skin:    General: Skin is warm.     Findings: No rash.  Neurological:     Mental Status: He is alert and oriented to person, place, and time.       Data  Reviewed: Basic Metabolic Panel: Recent Labs  Lab 07/29/20 1338 07/30/20 1629 07/30/20 1957 07/30/20 2110 07/31/20 0726 08/01/20 0514  NA 135 135  --   --  136 137  K 4.0 3.3*  --   --  4.0 3.6  CL 96* 93*  --   --  101 101  CO2 26 29  --   --  25 26  GLUCOSE 108* 102*  --   --  147* 151*  BUN 22 28*  --   --  27* 34*  CREATININE 1.41* 1.33*  --   --  1.24 1.12  CALCIUM 9.0 8.8*  --   --  8.5* 8.9  MG  --   --  1.0* 1.6*  --   --    Liver Function Tests: Recent Labs  Lab 07/30/20 1629 07/31/20 0726 08/01/20 0514  AST 38 33 38  ALT _0 ALKPHOS 62 54 57  BILITOT 1.1 0.8 0.8  PROT 7.9 6.5 6.9  ALBUMIN 3.9 3.2* 3.4*   CBC: Recent Labs  Lab 07/29/20 1338 07/30/20 1629 07/31/20 0726 08/01/20 0514  WBC 7.6 7.8 3.2* 5.6  NEUTROABS  --   --  2.2 4.3  HGB 15.7 15.3 15.1 14.9  HCT 45.2 45.7 42.9 42.7  MCV 91.9 94.0 90.1 91.4  PLT 200 210  172 182   BNP (last 3 results) Recent Labs    07/30/20 1629 07/30/20 2110  BNP 57.7 64.0    CBG: Recent Labs  Lab 07/31/20 1139 07/31/20 1555 07/31/20 2142 08/01/20 0751 08/01/20 1219  GLUCAP 145* 157* 132* 152* 160*    Recent Results (from the past 240 hour(s))  Respiratory Panel by RT PCR (Flu A&B, Covid) - Nasopharyngeal Swab     Status: Abnormal   Collection Time: 07/30/20 12:40 AM   Specimen: Nasopharyngeal Swab  Result Value Ref Range Status   SARS Coronavirus 2 by RT PCR POSITIVE (A) NEGATIVE Final    Comment: RESULT CALLED TO, READ BACK BY AND VERIFIED WITH:  STEPHEN JONES AT 0330 07/30/20 RH/SDR (NOTE) SARS-CoV-2 target nucleic acids are DETECTED.  SARS-CoV-2 RNA is generally detectable in upper respiratory specimens  during the acute phase of infection. Positive results are indicative of the presence of the identified virus, but do not rule out bacterial infection or co-infection with other pathogens not detected by the test. Clinical correlation with patient history and other diagnostic  information is necessary to determine patient infection status. The expected result is Negative.  Fact Sheet for Patients:  PinkCheek.be  Fact Sheet for Healthcare Providers: GravelBags.it  This test is not yet approved or cleared by the [place] FDA and  has been authorized for detection and/or diagnosis of SARS-CoV-2 by FDA under an Emergency Use Authorization (EUA).  This EUA will remain in effect (meaning this test can  be used) for the duration of  the COVID-19 declaration under Section 564(b)(1) of the Act, 21 U.S.C. section 360bbb-3(b)(1), unless the authorization is terminated or revoked sooner.      Influenza A by PCR NEGATIVE NEGATIVE Final   Influenza B by PCR NEGATIVE NEGATIVE Final    Comment: (NOTE) The Xpert Xpress SARS-CoV-2/FLU/RSV assay is intended as an aid in  the diagnosis of influenza from Nasopharyngeal swab specimens and  should not be used as a sole basis for treatment. Nasal washings and  aspirates are unacceptable for Xpert Xpress SARS-CoV-2/FLU/RSV  testing.  Fact Sheet for Patients: PinkCheek.be  Fact Sheet for Healthcare Providers: GravelBags.it  This test is not yet approved or cleared by the [place] FDA and  has been authorized for detection and/or diagnosis of SARS-CoV-2 by  FDA under an Emergency Use Authorization (EUA). This EUA will remain  in effect (meaning this test can be used) for the duration of the  Covid-19 declaration under Section 564(b)(1) of the Act, 21  U.S.C. section 360bbb-3(b)(1), unless the authorization is  terminated or revoked. Performed at [facility] [address] [place], [place] [address]      Studies: DG Chest 2 View  Result Date: 07/30/2020 CLINICAL DATA:  Chest pain EXAM: CHEST - 2 VIEW COMPARISON:  07/29/2020,, 01/25/2016 CT 01/28/2016 FINDINGS: Mild peripheral and  basilar ground-glass opacity, slightly more conspicuous compared to prior. Stable cardiomediastinal silhouette. No pneumothorax. IMPRESSION: Mild peripheral and basilar ground-glass opacities suspicious for bilateral pneumonia, findings are more conspicuous compared to previous exam. Electronically Signed   By: Donavan Foil M.D.   On: 07/30/2020 17:22    Scheduled Meds: . allopurinol  100 mg Oral Daily  . vitamin C  500 mg Oral Daily  . aspirin EC  81 mg Oral Daily  . atorvastatin  20 mg Oral q1800  . baricitinib  2 mg Oral Daily  . cholecalciferol  1,000 Units Oral Daily  . enoxaparin (LOVENOX) injection  40 mg Subcutaneous  Q24H  . famotidine  20 mg Oral Daily  . guaiFENesin  600 mg Oral BID  . hydrochlorothiazide  25 mg Oral Daily  . insulin aspart  0-15 Units Subcutaneous TID PC & HS  . mouth rinse  15 mL Mouth Rinse BID  . methylPREDNISolone (SOLU-MEDROL) injection  80 mg Intravenous Q12H   Followed by  . [START ON 08/03/2020] predniSONE  50 mg Oral Daily  . vitamin B-12  1,000 mcg Oral Daily  . zinc sulfate  220 mg Oral Daily   Continuous Infusions: . remdesivir 100 mg in NS 100 mL 100 mg (08/01/20 0844)    Assessment/Plan:  1. Acute hypoxic respiratory failure.  Pulse ox 80% on room air on presentation.  Needed to go back on oxygen yesterday afternoon but able to come off oxygen this afternoon. 2. COVID-19 multifocal pneumonia.  Remdesivir day 3.  Continue zinc, vitamin C and albuterol inhaler.  Admitting physician put on baricitinib.  Continue to monitor inflammatory markers.  Fibrin derivatives slightly higher today. 3. Chronic kidney disease.  Creatinine improved today to 1.12 this makes chronic kidney disease stage II. 4. Essential hypertension.  Continue hydrochlorothiazide 5. Type 2 diabetes mellitus with hyperlipidemia.  Continue atorvastatin.  Patient on sliding scale. 6. Hypokalemia.  This was on presentation.  Likely secondary to hydrochlorothiazide.    Code  Status:     Code Status Orders  (From admission, onward)         Start     Ordered   07/30/20 2015  Full code  Continuous        07/30/20 2026        Code Status History    Date Active Date Inactive Code Status Order ID Comments User Context   11/26/2013 1401 11/27/2013 1313 Full Code [id number]  Evans Lance, MD Inpatient   11/04/2013 2235 11/06/2013 1522 Full Code [id number]  Barrett, Evelene Croon, PA-C Inpatient   Advance Care Planning Activity     Family Communication: Spoke with wife on the phone. Disposition Plan: Status is: Inpatient  Dispo: The patient is from: Home              Anticipated d/c is to: Home              Anticipated d/c date is: 08/03/2020.  Patient does not want to finish up remdesivir as outpatient.              Patient currently being treated for COVID-19 pneumonia.  Time spent: 27 minutes  [place]

## 2020-08-02 DIAGNOSIS — J449 Chronic obstructive pulmonary disease, unspecified: Secondary | ICD-10-CM

## 2020-08-02 LAB — CBC WITH DIFFERENTIAL/PLATELET
Abs Immature Granulocytes: 0.03 10*3/uL (ref 0.00–0.07)
Basophils Absolute: 0 10*3/uL (ref 0.0–0.1)
Basophils Relative: 0 %
Eosinophils Absolute: 0 10*3/uL (ref 0.0–0.5)
Eosinophils Relative: 0 %
HCT: 44.2 % (ref 39.0–52.0)
Hemoglobin: 15.6 g/dL (ref 13.0–17.0)
Immature Granulocytes: 0 %
Lymphocytes Relative: 13 %
Lymphs Abs: 1 10*3/uL (ref 0.7–4.0)
MCH: 31.5 pg (ref 26.0–34.0)
MCHC: 35.3 g/dL (ref 30.0–36.0)
MCV: 89.3 fL (ref 80.0–100.0)
Monocytes Absolute: 0.4 10*3/uL (ref 0.1–1.0)
Monocytes Relative: 5 %
Neutro Abs: 6.3 10*3/uL (ref 1.7–7.7)
Neutrophils Relative %: 82 %
Platelets: 204 10*3/uL (ref 150–400)
RBC: 4.95 MIL/uL (ref 4.22–5.81)
RDW: 13.4 % (ref 11.5–15.5)
WBC: 7.7 10*3/uL (ref 4.0–10.5)
nRBC: 0 % (ref 0.0–0.2)

## 2020-08-02 LAB — GLUCOSE, CAPILLARY
Glucose-Capillary: 142 mg/dL — ABNORMAL HIGH (ref 70–99)
Glucose-Capillary: 112 mg/dL — ABNORMAL HIGH (ref 70–99)
Glucose-Capillary: 100 mg/dL — ABNORMAL HIGH (ref 70–99)
Glucose-Capillary: 104 mg/dL — ABNORMAL HIGH (ref 70–99)

## 2020-08-02 LAB — COMPREHENSIVE METABOLIC PANEL
ALT: 26 U/L (ref 0–44)
AST: 42 U/L — ABNORMAL HIGH (ref 15–41)
Albumin: 3.5 g/dL (ref 3.5–5.0)
Alkaline Phosphatase: 54 U/L (ref 38–126)
Anion gap: 11 (ref 5–15)
BUN: 36 mg/dL — ABNORMAL HIGH (ref 8–23)
CO2: 28 mmol/L (ref 22–32)
Calcium: 9.1 mg/dL (ref 8.9–10.3)
Chloride: 100 mmol/L (ref 98–111)
Creatinine, Ser: 1.11 mg/dL (ref 0.61–1.24)
GFR calc Af Amer: >60 mL/min (ref >60)
GFR calc non Af Amer: >60 mL/min (ref >60)
Glucose, Bld: 147 mg/dL — ABNORMAL HIGH (ref 70–99)
Potassium: 3.4 mmol/L — ABNORMAL LOW (ref 3.5–5.1)
Sodium: 139 mmol/L (ref 135–145)
Total Bilirubin: 0.8 mg/dL (ref 0.3–1.2)
Total Protein: 6.8 g/dL (ref 6.5–8.1)

## 2020-08-02 LAB — FIBRIN DERIVATIVES D-DIMER (ARMC ONLY): Fibrin derivatives D-dimer (ARMC): 518.89 ng/mL (FEU) — ABNORMAL HIGH (ref 0.00–499.00)

## 2020-08-02 LAB — FERRITIN: Ferritin: 258 ng/mL (ref 24–336)

## 2020-08-02 LAB — MAGNESIUM: Magnesium: 2.1 mg/dL (ref 1.7–2.4)

## 2020-08-02 LAB — C-REACTIVE PROTEIN: CRP: 1.5 mg/dL — ABNORMAL HIGH (ref <1.0)

## 2020-08-02 MED ORDER — POTASSIUM CHLORIDE CRYS ER 20 MEQ PO TBCR
20.0000 meq | EXTENDED_RELEASE_TABLET | Freq: Every day | ORAL | Status: DC
  Administered 2020-08-02 – 2020-08-03 (×2): 20 meq via ORAL
  Filled 2020-08-02 (×2): qty 1

## 2020-08-02 MED ORDER — SODIUM CHLORIDE 0.9 % IV SOLN
100.0000 mg | Freq: Every day | INTRAVENOUS | Status: AC
  Administered 2020-08-03: 06:00:00 100 mg via INTRAVENOUS

## 2020-08-02 NOTE — Care Management Important Message (Signed)
Important Message  Patient Details  Name: Caleb Conran [facility] Sr. MRN: [id number] Date of Birth: 1934/05/10   Medicare Important Message Given:  Yes  Reviewed with patient via room phone due to isolation status.  Declined copy of Medicare IM.     Dannette Barbara 08/02/2020, 2:48 PM

## 2020-08-02 NOTE — Progress Notes (Signed)
Patient ID: Caleb Wrightsman Sr., male   DOB: 08-17-1934, 86 y.o.   MRN: [id number] [facility] PROGRESS NOTE  Caleb Russ Parke Sr. WUJ:[id number] DOB: [birth year] DOA: 07/30/2020 PCP: [name], MD  HPI/Subjective: Patient had a coughing fit while I was in the room.  He says sometimes this happens when he has COPD and if he can cough it up it is rough with coughing.  But when he coughs up phlegm then it eases his coughing fits.  He is breathing better than when he came in.  His diarrhea has settled down.  Objective: Vitals:   08/02/20 0628 08/02/20 0801  BP: 118/74 125/78  Pulse: 64 61  Resp: 16 18  Temp: 97.7 F (36.5 C) 98.8 F (37.1 C)  SpO2: 93% 91%    Intake/Output Summary (Last 24 hours) at 08/02/2020 1430 Last data filed at 08/01/2020 1713 Gross per 24 hour  Intake 92.27 ml  Output --  Net 92.27 ml   Filed Weights   07/30/20 1624 07/30/20 2145  Weight: 79.4 kg 79.8 kg    ROS: Review of Systems  Respiratory: Positive for cough and shortness of breath.   Cardiovascular: Negative for chest pain.  Gastrointestinal: Negative for abdominal pain, diarrhea, nausea and vomiting.   Exam: Physical Exam HENT:     Mouth/Throat:     Pharynx: No oropharyngeal exudate.  Eyes:     General: Lids are normal.     Conjunctiva/sclera: Conjunctivae normal.  Cardiovascular:     Rate and Rhythm: Normal rate and regular rhythm.     Heart sounds: Normal heart sounds, S1 normal and S2 normal.  Pulmonary:     Breath sounds: Examination of the right-lower field reveals decreased breath sounds. Examination of the left-lower field reveals decreased breath sounds. Decreased breath sounds present. No wheezing, rhonchi or rales.  Abdominal:     Palpations: Abdomen is soft.     Tenderness: There is no abdominal tenderness.  Musculoskeletal:     Right ankle: No swelling.     Left ankle: No swelling.  Skin:    General: Skin is warm.     Findings: No rash.  Neurological:      Mental Status: He is alert and oriented to person, place, and time.       Data Reviewed: Basic Metabolic Panel: Recent Labs  Lab 07/29/20 1338 07/30/20 1629 07/30/20 1957 07/30/20 2110 07/31/20 0726 08/01/20 0514 08/02/20 0550  NA 135 135  --   --  136 137 139  K 4.0 3.3*  --   --  4.0 3.6 3.4*  CL 96* 93*  --   --  101 101 100  CO2 26 29  --   --  _0 GLUCOSE 108* 102*  --   --  147* 151* 147*  BUN 22 28*  --   --  27* 34* 36*  CREATININE 1.41* 1.33*  --   --  1.24 1.12 1.11  CALCIUM 9.0 8.8*  --   --  8.5* 8.9 9.1  MG  --   --  1.0* 1.6*  --   --  2.1   Liver Function Tests: Recent Labs  Lab 07/30/20 1629 07/31/20 0726 08/01/20 0514 08/02/20 0550  AST 38 33 38 42*  ALT _1 ALKPHOS 62 54 57 54  BILITOT 1.1 0.8 0.8 0.8  PROT 7.9 6.5 6.9 6.8  ALBUMIN 3.9 3.2* 3.4* 3.5   CBC: Recent Labs  Lab 07/29/20 1338  07/30/20 1629 07/31/20 0726 08/01/20 0514 08/02/20 0550  WBC 7.6 7.8 3.2* 5.6 7.7  NEUTROABS  --   --  2.2 4.3 6.3  HGB 15.7 15.3 15.1 14.9 15.6  HCT 45.2 45.7 42.9 42.7 44.2  MCV 91.9 94.0 90.1 91.4 89.3  PLT 200 210 172 182 204   BNP (last 3 results) Recent Labs    07/30/20 1629 07/30/20 2110  BNP 57.7 64.0    CBG: Recent Labs  Lab 08/01/20 1219 08/01/20 1707 08/01/20 2048 08/02/20 0802 08/02/20 1226  GLUCAP 160* 123* 127* 142* 112*    Recent Results (from the past 240 hour(s))  Respiratory Panel by RT PCR (Flu A&B, Covid) - Nasopharyngeal Swab     Status: Abnormal   Collection Time: 07/30/20 12:40 AM   Specimen: Nasopharyngeal Swab  Result Value Ref Range Status   SARS Coronavirus 2 by RT PCR POSITIVE (A) NEGATIVE Final    Comment: RESULT CALLED TO, READ BACK BY AND VERIFIED WITH:  STEPHEN JONES AT 0330 07/30/20 RH/SDR (NOTE) SARS-CoV-2 target nucleic acids are DETECTED.  SARS-CoV-2 RNA is generally detectable in upper respiratory specimens  during the acute phase of infection. Positive results are indicative of  the presence of the identified virus, but do not rule out bacterial infection or co-infection with other pathogens not detected by the test. Clinical correlation with patient history and other diagnostic information is necessary to determine patient infection status. The expected result is Negative.  Fact Sheet for Patients:  PinkCheek.be  Fact Sheet for Healthcare Providers: GravelBags.it  This test is not yet approved or cleared by the [place] FDA and  has been authorized for detection and/or diagnosis of SARS-CoV-2 by FDA under an Emergency Use Authorization (EUA).  This EUA will remain in effect (meaning this test can  be used) for the duration of  the COVID-19 declaration under Section 564(b)(1) of the Act, 21 U.S.C. section 360bbb-3(b)(1), unless the authorization is terminated or revoked sooner.      Influenza A by PCR NEGATIVE NEGATIVE Final   Influenza B by PCR NEGATIVE NEGATIVE Final    Comment: (NOTE) The Xpert Xpress SARS-CoV-2/FLU/RSV assay is intended as an aid in  the diagnosis of influenza from Nasopharyngeal swab specimens and  should not be used as a sole basis for treatment. Nasal washings and  aspirates are unacceptable for Xpert Xpress SARS-CoV-2/FLU/RSV  testing.  Fact Sheet for Patients: PinkCheek.be  Fact Sheet for Healthcare Providers: GravelBags.it  This test is not yet approved or cleared by the [place] FDA and  has been authorized for detection and/or diagnosis of SARS-CoV-2 by  FDA under an Emergency Use Authorization (EUA). This EUA will remain  in effect (meaning this test can be used) for the duration of the  Covid-19 declaration under Section 564(b)(1) of the Act, 21  U.S.C. section 360bbb-3(b)(1), unless the authorization is  terminated or revoked. Performed at [facility], [address]  [place], [place] [address]      Scheduled Meds: . allopurinol  100 mg Oral Daily  . vitamin C  500 mg Oral Daily  . aspirin EC  81 mg Oral Daily  . atorvastatin  20 mg Oral q1800  . baricitinib  2 mg Oral Daily  . cholecalciferol  1,000 Units Oral Daily  . enoxaparin (LOVENOX) injection  40 mg Subcutaneous Q24H  . famotidine  20 mg Oral Daily  . guaiFENesin  600 mg Oral BID  . hydrochlorothiazide  25 mg Oral Daily  .  insulin aspart  0-15 Units Subcutaneous TID PC & HS  . mouth rinse  15 mL Mouth Rinse BID  . methylPREDNISolone (SOLU-MEDROL) injection  80 mg Intravenous Q12H   Followed by  . [START ON 08/03/2020] predniSONE  50 mg Oral Daily  . vitamin B-12  1,000 mcg Oral Daily  . zinc sulfate  220 mg Oral Daily   Continuous Infusions: . [START ON 08/03/2020] remdesivir 100 mg in NS 100 mL      Assessment/Plan:  1. Acute hypoxic respiratory failure.  Initial pulse ox was 80% on room air.  Patient off oxygen since yesterday afternoon. 2. COVID-19 multifocal pneumonia.  Remdesivir day 4 today.  Continue zinc, vitamin C and albuterol inhaler.  Admitting physician placed on baricitinib.  Fibrin derivatives trending lower today. 3. COPD.  Continue inhaler. 4. Chronic kidney disease stage II.  Creatinine now 1.11 and GFR greater than 60. 5. Essential hypertension on hydrochlorothiazide 6. Type 2 diabetes mellitus with hyperlipidemia.  Continue atorvastatin.  Patient on sliding scale.  Hemoglobin A1c 6.5. 7. Hypokalemia replace potassium daily while on hydrochlorothiazide.    Code Status:     Code Status Orders  (From admission, onward)         Start     Ordered   07/30/20 2015  Full code  Continuous        07/30/20 2026        Code Status History    Date Active Date Inactive Code Status Order ID Comments User Context   11/26/2013 1401 11/27/2013 1313 Full Code [id number]  Evans Lance, MD Inpatient   11/04/2013 2235 11/06/2013 1522 Full Code [id number]  Barrett, Evelene Croon, PA-C  Inpatient   Advance Care Planning Activity     Family Communication: Spoke with wife on the phone Disposition Plan: Status is: Inpatient  Dispo: The patient is from: Home              Anticipated d/c is to: Home              Anticipated d/c date is: 08/03/2020              Patient currently being treated for COVID-19 pneumonia and received day 4 of remdesivir today and will receive day 5 tomorrow morning.  Patient did not want to finish up course as outpatient.  Time spent: 27 minutes  [place]

## 2020-08-03 LAB — FIBRIN DERIVATIVES D-DIMER (ARMC ONLY): Fibrin derivatives D-dimer (ARMC): 448.58 ng/mL (FEU) (ref 0.00–499.00)

## 2020-08-03 LAB — GLUCOSE, CAPILLARY
Glucose-Capillary: 176 mg/dL — ABNORMAL HIGH (ref 70–99)
Glucose-Capillary: 105 mg/dL — ABNORMAL HIGH (ref 70–99)

## 2020-08-03 LAB — CBC WITH DIFFERENTIAL/PLATELET
Abs Immature Granulocytes: 0.06 10*3/uL (ref 0.00–0.07)
Basophils Absolute: 0 10*3/uL (ref 0.0–0.1)
Basophils Relative: 0 %
Eosinophils Absolute: 0 10*3/uL (ref 0.0–0.5)
Eosinophils Relative: 0 %
HCT: 44.2 % (ref 39.0–52.0)
Hemoglobin: 15.5 g/dL (ref 13.0–17.0)
Immature Granulocytes: 1 %
Lymphocytes Relative: 15 %
Lymphs Abs: 1.1 10*3/uL (ref 0.7–4.0)
MCH: 32.2 pg (ref 26.0–34.0)
MCHC: 35.1 g/dL (ref 30.0–36.0)
MCV: 91.7 fL (ref 80.0–100.0)
Monocytes Absolute: 0.2 10*3/uL (ref 0.1–1.0)
Monocytes Relative: 3 %
Neutro Abs: 5.9 10*3/uL (ref 1.7–7.7)
Neutrophils Relative %: 81 %
Platelets: 219 10*3/uL (ref 150–400)
RBC: 4.82 MIL/uL (ref 4.22–5.81)
RDW: 13.3 % (ref 11.5–15.5)
WBC: 7.2 10*3/uL (ref 4.0–10.5)
nRBC: 0 % (ref 0.0–0.2)

## 2020-08-03 LAB — COMPREHENSIVE METABOLIC PANEL
ALT: 29 U/L (ref 0–44)
AST: 44 U/L — ABNORMAL HIGH (ref 15–41)
Albumin: 3.5 g/dL (ref 3.5–5.0)
Alkaline Phosphatase: 54 U/L (ref 38–126)
Anion gap: 10 (ref 5–15)
BUN: 37 mg/dL — ABNORMAL HIGH (ref 8–23)
CO2: 31 mmol/L (ref 22–32)
Calcium: 8.9 mg/dL (ref 8.9–10.3)
Chloride: 98 mmol/L (ref 98–111)
Creatinine, Ser: 1.23 mg/dL (ref 0.61–1.24)
GFR calc Af Amer: >60 mL/min (ref >60)
GFR calc non Af Amer: 54 mL/min — ABNORMAL LOW (ref >60)
Glucose, Bld: 156 mg/dL — ABNORMAL HIGH (ref 70–99)
Potassium: 3.6 mmol/L (ref 3.5–5.1)
Sodium: 139 mmol/L (ref 135–145)
Total Bilirubin: 0.9 mg/dL (ref 0.3–1.2)
Total Protein: 6.7 g/dL (ref 6.5–8.1)

## 2020-08-03 LAB — FERRITIN: Ferritin: 224 ng/mL (ref 24–336)

## 2020-08-03 LAB — C-REACTIVE PROTEIN: CRP: 1 mg/dL — ABNORMAL HIGH (ref <1.0)

## 2020-08-03 MED ORDER — HYDROCOD POLST-CPM POLST ER 10-8 MG/5ML PO SUER
5.0000 mL | Freq: Two times a day (BID) | ORAL | 0 refills | Status: DC | PRN
Start: 2020-08-03 — End: 2020-12-09

## 2020-08-03 MED ORDER — PREDNISONE 20 MG PO TABS: ORAL_TABLET | ORAL | 0 refills | Status: DC

## 2020-08-03 MED ORDER — VITAMIN D3 25 MCG PO TABS: 1000.0000 [IU] | ORAL_TABLET | Freq: Every day | ORAL | 0 refills | Status: DC

## 2020-08-03 MED ORDER — POTASSIUM CHLORIDE CRYS ER 10 MEQ PO TBCR: 20.0000 meq | EXTENDED_RELEASE_TABLET | Freq: Every day | ORAL | 0 refills | Status: DC

## 2020-08-03 MED ORDER — ASCORBIC ACID 500 MG PO TABS
500.0000 mg | ORAL_TABLET | Freq: Every day | ORAL | 0 refills | Status: DC
Start: 2020-08-04 — End: 2021-01-06

## 2020-08-03 MED ORDER — ZINC SULFATE 220 (50 ZN) MG PO CAPS
220.0000 mg | ORAL_CAPSULE | Freq: Every day | ORAL | 0 refills | Status: DC
Start: 2020-08-04 — End: 2020-12-10

## 2020-08-03 MED ORDER — ALBUTEROL SULFATE HFA 108 (90 BASE) MCG/ACT IN AERS: 2.0000 | INHALATION_SPRAY | RESPIRATORY_TRACT | 0 refills | Status: DC | PRN

## 2020-08-03 NOTE — Discharge Summary (Signed)
[facility] NAME: Caleb Bryant    MR#:  [id number]  DATE OF BIRTH:  [birth year]  DATE OF ADMISSION:  07/30/2020 ADMITTING PHYSICIAN: Christel Mormon, MD  DATE OF DISCHARGE: 08/03/2020 12:50 PM  PRIMARY CARE PHYSICIAN: [name], MD    ADMISSION DIAGNOSIS:  Dehydration [E86.0] Hypokalemia [E87.6] Acute respiratory failure with hypoxia (HCC) [J96.01] Acute hypoxemic respiratory failure due to COVID-19 (HCC) [U07.1, J96.01] Pneumonia due to COVID-19 virus [U07.1, J12.82]  DISCHARGE DIAGNOSIS:  Active Problems:   Essential hypertension   Chronic obstructive pulmonary disease ([place])   Pneumonia due to COVID-19 virus   Acute respiratory failure with hypoxia (HCC)   Stage 3a chronic kidney disease (HCC)   Hypokalemia   Type 2 diabetes mellitus with hyperlipidemia (HCC)   CKD (chronic kidney disease), stage II   SECONDARY DIAGNOSIS:   Past Medical History:  Diagnosis Date  . Anxiety   . Atrial flutter ([place])    "just dx'd today" (11/04/2013)  . Benign prostatic hypertrophy   . Borderline diabetes   . Colon, diverticulosis   . COPD (chronic obstructive pulmonary disease) ([place])   . Dysrhythmia   . Exertional shortness of breath   . Gout   . Hyperlipidemia   . Hypertension   . Osteoarthritis    "all over" (11/04/2013)  . PVD (peripheral vascular disease) ([place])     HOSPITAL COURSE:   1.  Acute hypoxic respiratory failure which had resolved.  Initial pulse ox was 80% on room air.  Patient came off oxygen on 08/01/2020 in the afternoon. 2.  COVID-19 multifocal pneumonia.  Finished remdesivir day 5.  Received IV Solu-Medrol and switched over to prednisone.  Will give 5 more days of prednisone.  Admitting physician also given baricitinib.  Fibrin derivatives trending lower and CRP also trended lower.  Since his wife does not have a Covid infection continued isolation for total of 14 days from diagnosis of 07/29/2020. 3.  COPD.   Continue inhaler. 4.  Chronic kidney disease stage IIIa creatinine 1.23 upon disposition. 5.  Essential hypertension on hydrochlorothiazide 6.  Type 2 diabetes mellitus with hyperlipidemia.  Continue atorvastatin.  Hemoglobin A1c just 6.5. 7.  Hypokalemia.  Replace potassium daily while on hydrochlorothiazide  DISCHARGE CONDITIONS:   Satisfactory CONSULTS OBTAINED:  None  DRUG ALLERGIES:   Allergies  Allergen Reactions  . Codeine Nausea And Vomiting    DISCHARGE MEDICATIONS:   Allergies as of 08/03/2020      Reactions   Codeine Nausea And Vomiting      Medication List    STOP taking these medications   meloxicam 7.5 MG tablet Commonly known as: MOBIC     TAKE these medications   albuterol 108 (90 Base) MCG/ACT inhaler Commonly known as: VENTOLIN HFA Inhale 2 puffs into the lungs every 4 (four) hours as needed for shortness of breath or wheezing.   allopurinol 100 MG tablet Commonly known as: ZYLOPRIM TAKE 1 TABLET(100 MG) BY MOUTH DAILY What changed:  how much to take how to take this when to take this additional instructions   ascorbic acid 500 MG tablet Commonly known as: VITAMIN C Take 1 tablet (500 mg total) by mouth daily. Start taking on: August 04, 2020   atorvastatin 20 MG tablet Commonly known as: LIPITOR Take one table by mouth daily What changed:  how much to take how to take this when to take this additional instructions   B-12 PO Take 1,000 mcg  by mouth daily.   chlorpheniramine-HYDROcodone 10-8 MG/5ML Suer Commonly known as: TUSSIONEX Take 5 mLs by mouth every 12 (twelve) hours as needed for cough.   fluorometholone 0.1 % ophthalmic suspension Commonly known as: FML Place 1 drop into both eyes 2 (two) times daily.   hydrochlorothiazide 25 MG tablet Commonly known as: HYDRODIURIL Take one tablet by mouth daily What changed:  how much to take how to take this when to take this additional instructions   methocarbamol 500 MG  tablet Commonly known as: ROBAXIN Take 500 mg by mouth 3 (three) times daily as needed for muscle spasms.   potassium chloride 10 MEQ tablet Commonly known as: KLOR-CON Take 2 tablets (20 mEq total) by mouth daily. Start taking on: August 04, 2020   predniSONE 20 MG tablet Commonly known as: DELTASONE 2 tabs po daily for five days Start taking on: August 04, 2020   Vitamin D3 25 MCG tablet Commonly known as: Vitamin D Take 1 tablet (1,000 Units total) by mouth daily. Start taking on: August 04, 2020   zinc sulfate 220 (50 Zn) MG capsule Take 1 capsule (220 mg total) by mouth daily. Start taking on: August 04, 2020        DISCHARGE INSTRUCTIONS:   Follow-up PMD 2 weeks  If you experience worsening of your admission symptoms, develop shortness of breath, life threatening emergency, suicidal or homicidal thoughts you must seek medical attention immediately by calling 911 or calling your MD immediately  if symptoms less severe.  You Must read complete instructions/literature along with all the possible adverse reactions/side effects for all the Medicines you take and that have been prescribed to you. Take any new Medicines after you have completely understood and accept all the possible adverse reactions/side effects.   Please note  You were cared for by a hospitalist during your hospital stay. If you have any questions about your discharge medications or the care you received while you were in the hospital after you are discharged, you can call the unit and asked to speak with the hospitalist on call if the hospitalist that took care of you is not available. Once you are discharged, your primary care physician will handle any further medical issues. Please note that NO REFILLS for any discharge medications will be authorized once you are discharged, as it is imperative that you return to your primary care physician (or establish a relationship with a primary care physician if you do  not have one) for your aftercare needs so that they can reassess your need for medications and monitor your lab values.    Today   CHIEF COMPLAINT:   Chief Complaint  Patient presents with  . Shortness of Breath  . Covid Positive    HISTORY OF PRESENT ILLNESS:  Caleb Bryant  is a 86 y.o. male with shortness of breath and Covid positive and found to be hypoxic.   VITAL SIGNS:  Blood pressure 106/88, pulse 74, temperature 97.6 F (36.4 C), temperature source Oral, resp. rate 18, height _0  (1.676 m), weight 79.8 kg, SpO2 90 %.  I/O:    Intake/Output Summary (Last 24 hours) at 08/03/2020 1650 Last data filed at 08/03/2020 0728 Gross per 24 hour  Intake 51.91 ml  Output --  Net 51.91 ml    PHYSICAL EXAMINATION:  GENERAL:  86 y.o.-year-old patient lying in the bed with no acute distress.  EYES: Pupils equal, round, reactive to light and accommodation. No scleral icterus. HEENT: Head atraumatic, normocephalic. Oropharynx  and nasopharynx clear.   LUNGS: Decreased breath sounds bilaterally, no wheezing, rales,rhonchi or crepitation. No use of accessory muscles of respiration.  CARDIOVASCULAR: S1, S2 normal. No murmurs, rubs, or gallops.  ABDOMEN: Soft, non-tender.  EXTREMITIES: No pedal edema.  NEUROLOGIC: Cranial nerves II through XII are intact. Muscle strength 5/5 in all extremities. Sensation intact. Gait not checked.  PSYCHIATRIC: The patient is alert and oriented x 3.  SKIN: No obvious rash, lesion, or ulcer.   DATA REVIEW:   CBC Recent Labs  Lab 08/03/20 0530  WBC 7.2  HGB 15.5  HCT 44.2  PLT 219    Chemistries  Recent Labs  Lab 08/02/20 0550 08/02/20 0550 08/03/20 0530  NA 139   < > 139  K 3.4*   < > 3.6  CL 100   < > 98  CO2 28   < > 31  GLUCOSE 147*   < > 156*  BUN 36*   < > 37*  CREATININE 1.11   < > 1.23  CALCIUM 9.1   < > 8.9  MG 2.1  --   --   AST 42*   < > 44*  ALT 26   < > 29  ALKPHOS 54   < > 54  BILITOT 0.8   < > 0.9   < > =  values in this interval not displayed.    Microbiology Results  Results for orders placed or performed during the hospital encounter of 07/30/20  Respiratory Panel by RT PCR (Flu A&B, Covid) - Nasopharyngeal Swab     Status: Abnormal   Collection Time: 07/30/20 12:40 AM   Specimen: Nasopharyngeal Swab  Result Value Ref Range Status   SARS Coronavirus 2 by RT PCR POSITIVE (A) NEGATIVE Final    Comment: RESULT CALLED TO, READ BACK BY AND VERIFIED WITH:  STEPHEN JONES AT 0330 07/30/20 RH/SDR (NOTE) SARS-CoV-2 target nucleic acids are DETECTED.  SARS-CoV-2 RNA is generally detectable in upper respiratory specimens  during the acute phase of infection. Positive results are indicative of the presence of the identified virus, but do not rule out bacterial infection or co-infection with other pathogens not detected by the test. Clinical correlation with patient history and other diagnostic information is necessary to determine patient infection status. The expected result is Negative.  Fact Sheet for Patients:  PinkCheek.be  Fact Sheet for Healthcare Providers: GravelBags.it  This test is not yet approved or cleared by the [place] FDA and  has been authorized for detection and/or diagnosis of SARS-CoV-2 by FDA under an Emergency Use Authorization (EUA).  This EUA will remain in effect (meaning this test can  be used) for the duration of  the COVID-19 declaration under Section 564(b)(1) of the Act, 21 U.S.C. section 360bbb-3(b)(1), unless the authorization is terminated or revoked sooner.      Influenza A by PCR NEGATIVE NEGATIVE Final   Influenza B by PCR NEGATIVE NEGATIVE Final    Comment: (NOTE) The Xpert Xpress SARS-CoV-2/FLU/RSV assay is intended as an aid in  the diagnosis of influenza from Nasopharyngeal swab specimens and  should not be used as a sole basis for treatment. Nasal washings and  aspirates are  unacceptable for Xpert Xpress SARS-CoV-2/FLU/RSV  testing.  Fact Sheet for Patients: PinkCheek.be  Fact Sheet for Healthcare Providers: GravelBags.it  This test is not yet approved or cleared by the [place] FDA and  has been authorized for detection and/or diagnosis of SARS-CoV-2 by  FDA under an Emergency Use Authorization (  EUA). This EUA will remain  in effect (meaning this test can be used) for the duration of the  Covid-19 declaration under Section 564(b)(1) of the Act, 21  U.S.C. section 360bbb-3(b)(1), unless the authorization is  terminated or revoked. Performed at [facility], [address], [place], [place] [address]      Management plans discussed with the patient, family and they are in agreement.  CODE STATUS:     Code Status Orders  (From admission, onward)         Start     Ordered   07/30/20 2015  Full code  Continuous        07/30/20 2026        Code Status History    Date Active Date Inactive Code Status Order ID Comments User Context   11/26/2013 1401 11/27/2013 1313 Full Code [id number]  Evans Lance, MD Inpatient   11/04/2013 2235 11/06/2013 1522 Full Code [id number]  Barrett, Evelene Croon, PA-C Inpatient   Advance Care Planning Activity      TOTAL TIME TAKING CARE OF THIS PATIENT: 31 minutes.    Loletha Grayer M.D on 08/03/2020 at 4:50 PM  Between 7am to 6pm - Pager - [phone]  After 6pm go to [email or web address] - password [email or web address]  [facility]  CC: Primary care physician; [name], MD

## 2020-08-03 NOTE — Progress Notes (Signed)
D/C instructions provided and placed in with pt belongings at d/c. All questions addressed at time of d/c and unit phone # provided for any arising questions once discussed with spouse. Pt belonging returned.

## 2020-08-04 ENCOUNTER — Telehealth: Payer: Self-pay

## 2020-08-04 NOTE — Telephone Encounter (Signed)
Transition Care Management Follow-up Telephone Call  Date of discharge and from where: 08/03/2020, [facility]   How have you been since you were released from the hospital? Spoke with wife Caleb Bryant, HIPAA verified, and she states that patient is doing really good. No complaints at this time.  Any questions or concerns? No  Items Reviewed:  Did the pt receive and understand the discharge instructions provided? Yes   Medications obtained and verified? Yes   Any new allergies since your discharge? No   Dietary orders reviewed? Yes  Do you have support at home? Yes   Functional Questionnaire: (I = Independent and D = Dependent) ADLs: I  Bathing/Dressing- I  Meal Prep- I  Eating- I  Maintaining continence- I  Transferring/Ambulation- I  Managing Meds- I  Follow up appointments reviewed:   PCP Hospital f/u appt confirmed? Yes  Scheduled to see Dr. Diona Browner on 08/13/2020 @ 11 am.  [facility] f/u appt confirmed? N/A   Are transportation arrangements needed? No   If their condition worsens, is the pt aware to call PCP or go to the Emergency Dept.? Yes  Was the patient provided with contact information for the PCP's office or ED? Yes  Was to pt encouraged to call back with questions or concerns? Yes

## 2020-08-13 ENCOUNTER — Encounter: Payer: Self-pay | Admitting: Family Medicine

## 2020-08-13 ENCOUNTER — Ambulatory Visit (INDEPENDENT_AMBULATORY_CARE_PROVIDER_SITE_OTHER): Payer: PPO | Admitting: Family Medicine

## 2020-08-13 ENCOUNTER — Other Ambulatory Visit: Payer: Self-pay

## 2020-08-13 VITALS — BP 110/60 | HR 46 | Temp 97.6°F | Ht 65.0 in | Wt 170.0 lb

## 2020-08-13 DIAGNOSIS — R0902 Hypoxemia: Secondary | ICD-10-CM | POA: Diagnosis not present

## 2020-08-13 DIAGNOSIS — J1282 Pneumonia due to coronavirus disease 2019: Secondary | ICD-10-CM | POA: Diagnosis not present

## 2020-08-13 DIAGNOSIS — J449 Chronic obstructive pulmonary disease, unspecified: Secondary | ICD-10-CM

## 2020-08-13 DIAGNOSIS — I5022 Chronic systolic (congestive) heart failure: Secondary | ICD-10-CM | POA: Diagnosis not present

## 2020-08-13 DIAGNOSIS — U071 COVID-19: Secondary | ICD-10-CM

## 2020-08-13 NOTE — Progress Notes (Signed)
Chief Complaint  Patient presents with  . Hospitalization Follow-up    Here with wife. [facility] 10-1 to 10-5 for Covid. Oxygen sat is dropping when walking. Still fatigued. Finished prednisone.    History of Present Illness: HPI   86 year old male presents for follow up COVID PNA.  Admitted 07/30/2020 to 08/03/2020  1.  Acute hypoxic respiratory failure which had resolved.  Initial pulse ox was 80% on room air.  Patient came off oxygen on 08/01/2020 in the afternoon. 2.  COVID-19 multifocal pneumonia.  Finished remdesivir day 5.  Received IV Solu-Medrol and switched over to prednisone.  Will give 5 more days of prednisone.  Admitting physician also given baricitinib.  Fibrin derivatives trending lower and CRP also trended lower.  Since his wife does not have a Covid infection continued isolation for total of 14 days from diagnosis of 07/29/2020. 3.  COPD.  Continue inhaler. 4.  Chronic kidney disease stage IIIa creatinine 1.23 upon disposition. 5.  Essential hypertension on hydrochlorothiazide 6.  Type 2 diabetes mellitus with hyperlipidemia.  Continue atorvastatin.  Hemoglobin A1c just 6.5. 7.  Hypokalemia.  Replace potassium daily while on hydrochlorothiazide  08/13/20  Today he reports he is still very tired. Poor endurance. Cannot do what he used to.  he is eating better now, but still decreased.  He is significantly improved from hospitalization.  Minimal cough at this point.  No fever.  Shortness of breath is improving but he remain SOB with minimal exertion.Using incentive spirometry. Using albuterol BID.    His oxygen remains 90-94% at rest... he has noted  O2 sat dropping to 88-89% with walking, once it dropped on 10/6 to 93%  This visit occurred during the SARS-CoV-2 public health emergency.  Safety protocols were in place, including screening questions prior to the visit, additional usage of staff PPE, and extensive cleaning of exam room while observing appropriate contact time  as indicated for disinfecting solutions.   COVID 19 screen:  No recent travel or known exposure to COVID19 The patient denies respiratory symptoms of COVID 19 at this time. The importance of social distancing was discussed today.     Review of Systems  Constitutional: Negative for chills and fever.  HENT: Negative for congestion and ear pain.   Eyes: Negative for pain and redness.  Respiratory: Positive for cough and shortness of breath.   Cardiovascular: Negative for chest pain, palpitations and leg swelling.  Gastrointestinal: Negative for abdominal pain, blood in stool, constipation, diarrhea, nausea and vomiting.  Genitourinary: Negative for dysuria.  Musculoskeletal: Negative for falls and myalgias.  Skin: Negative for rash.  Neurological: Negative for dizziness.  Psychiatric/Behavioral: Negative for depression. The patient is not nervous/anxious.       Past Medical History:  Diagnosis Date  . Anxiety   . Atrial flutter ([place])    "just dx'd today" (11/04/2013)  . Benign prostatic hypertrophy   . Borderline diabetes   . Colon, diverticulosis   . COPD (chronic obstructive pulmonary disease) ([place])   . Dysrhythmia   . Exertional shortness of breath   . Gout   . Hyperlipidemia   . Hypertension   . Osteoarthritis    "all over" (11/04/2013)  . PVD (peripheral vascular disease) ([place])     reports that he has quit smoking. His smoking use included cigarettes. He has a 90.00 pack-year smoking history. He has quit using smokeless tobacco.  His smokeless tobacco use included chew. He reports that he does not drink alcohol and does not  use drugs.   Current Outpatient Medications:  .  albuterol (VENTOLIN HFA) 108 (90 Base) MCG/ACT inhaler, Inhale 2 puffs into the lungs every 4 (four) hours as needed for shortness of breath or wheezing., Disp: 18 g, Rfl: 0 .  allopurinol (ZYLOPRIM) 100 MG tablet, TAKE 1 TABLET(100 MG) BY MOUTH DAILY (Patient taking differently: Take 100 mg by mouth daily.  ), Disp: 90 tablet, Rfl: 3 .  ascorbic acid (VITAMIN C) 500 MG tablet, Take 1 tablet (500 mg total) by mouth daily., Disp: 30 tablet, Rfl: 0 .  aspirin 81 MG chewable tablet, Chew by mouth daily., Disp: , Rfl:  .  atorvastatin (LIPITOR) 20 MG tablet, Take one table by mouth daily (Patient taking differently: Take 20 mg by mouth daily. ), Disp: 90 tablet, Rfl: 3 .  chlorpheniramine-HYDROcodone (TUSSIONEX) 10-8 MG/5ML SUER, Take 5 mLs by mouth every 12 (twelve) hours as needed for cough., Disp: 70 mL, Rfl: 0 .  cholecalciferol (VITAMIN D) 25 MCG tablet, Take 1 tablet (1,000 Units total) by mouth daily., Disp: 30 tablet, Rfl: 0 .  Cyanocobalamin (B-12 PO), Take 1,000 mcg by mouth daily. , Disp: , Rfl:  .  fluorometholone (FML) 0.1 % ophthalmic suspension, Place 1 drop into both eyes 2 (two) times daily. , Disp: , Rfl:  .  hydrochlorothiazide (HYDRODIURIL) 25 MG tablet, Take one tablet by mouth daily (Patient taking differently: Take 25 mg by mouth daily. ), Disp: 90 tablet, Rfl: 3 .  potassium chloride SA (KLOR-CON) 10 MEQ tablet, Take 2 tablets (20 mEq total) by mouth daily., Disp: 30 tablet, Rfl: 0 .  zinc sulfate 220 (50 Zn) MG capsule, Take 1 capsule (220 mg total) by mouth daily., Disp: 30 capsule, Rfl: 0 .  methocarbamol (ROBAXIN) 500 MG tablet, Take 500 mg by mouth 3 (three) times daily as needed for muscle spasms.  (Patient not taking: Reported on 08/13/2020), Disp: , Rfl:    Observations/Objective: Blood pressure 110/60, pulse (!) 46, temperature 97.6 F (36.4 C), height _0  (1.651 m), weight 170 lb (77.1 kg), SpO2 92 %.  Physical Exam Constitutional:      Appearance: He is well-developed.  HENT:     Head: Normocephalic.     Right Ear: Hearing normal.     Left Ear: Hearing normal.     Nose: Nose normal.  Neck:     Thyroid: No thyroid mass or thyromegaly.     Vascular: No carotid bruit.     Trachea: Trachea normal.  Cardiovascular:     Rate and Rhythm: Normal rate and regular  rhythm.     Pulses: Normal pulses.     Heart sounds: Heart sounds not distant. No murmur heard.  No friction rub. No gallop.      Comments: No peripheral edema Pulmonary:     Effort: Pulmonary effort is normal. No respiratory distress.     Breath sounds: Examination of the right-lower field reveals rhonchi. Examination of the left-lower field reveals rhonchi. Rhonchi present.  Skin:    General: Skin is warm and dry.     Findings: No rash.  Psychiatric:        Speech: Speech normal.        Behavior: Behavior normal.        Thought Content: Thought content normal.      Assessment and Plan Pneumonia due to COVID-19 virus Resolving, S?P Remdesivir,baricitinib Solumedrol and prednisone but continued oxygen requirement with ambulation.  Hypoxia Order DME oxygen to use continuously 2 L [place]  given O2Sats dropping below threshold with minimal ambulation.  Oxygen Testing During Exercise: 1. Room Air Resting Pulse Oximetry: 92% 2. Room Air Pulse Oximetry During Exercise: 86% 3. Pulse Oximertry on 2 LPM )2 via Nasal Cannula During Exercise 96%  Chronic obstructive pulmonary disease (HCC) COPD history may be contributing to slow recovery from COVID19.  USing albuterol prn. No clear indication for further steroids.  Chronic systolic CHF (congestive heart failure) (HCC) Not sign of fluid overload. Continue current meds.   Eliezer Lofts, MD

## 2020-08-13 NOTE — Progress Notes (Signed)
Oxygen Testing During Exercise: 1. Room Air Resting Pulse Oximetry: 92% 2. Room Air Pulse Oximetry During Exercise: 86% 3. Pulse Oximertry on 2 LPM )2 via Nasal Cannula During Exercise 96%

## 2020-08-13 NOTE — Patient Instructions (Addendum)
Rest, keep up with fluids, continue vitamins.  Call if new fever, worsening cough or increase shortness of breath.  Continue albuterol BID and incentive spirometry.  We will set up oxygen to use when ambulating.

## 2020-08-17 ENCOUNTER — Telehealth: Payer: Self-pay

## 2020-08-17 NOTE — Telephone Encounter (Signed)
Mrs Wecker [facility] signed) left v/m; has not heard about oxygen order that Dr Diona Browner placed at office visit on 08/13/20. Mrs Gilmer request cb.

## 2020-08-17 NOTE — Telephone Encounter (Signed)
I was unaware of oxygen orders.  Will send Dahlia Client with [employer] a community message.

## 2020-08-17 NOTE — Telephone Encounter (Signed)
Tobey Grim, CMA stated on date DME oxygen ordered that she would send a message. If needed.  Not sure if she did.  Will CC: [place]

## 2020-08-18 NOTE — Telephone Encounter (Signed)
Thank you. I forgot to send the community message.

## 2020-08-19 NOTE — Telephone Encounter (Signed)
Anderson Malta from [facility] called and requesting order to be resent and office notes as well.  [phone]

## 2020-08-19 NOTE — Telephone Encounter (Signed)
Office note is not finished.  They also need to know how many liters of O2 patient needs to be on.  See orders in office in box.

## 2020-08-20 DIAGNOSIS — R0902 Hypoxemia: Secondary | ICD-10-CM | POA: Diagnosis not present

## 2020-08-20 DIAGNOSIS — J1282 Pneumonia due to coronavirus disease 2019: Secondary | ICD-10-CM | POA: Diagnosis not present

## 2020-08-20 NOTE — Assessment & Plan Note (Signed)
COPD history may be contributing to slow recovery from COVID19.  USing albuterol prn. No clear indication for further steroids.

## 2020-08-20 NOTE — Telephone Encounter (Signed)
Spoke with [employer].  She states the O2 order is still incomplete.  We need to mark home concentrator, portable oxygen tank and nasal cannula on order form.  She faxed forms back to me.  I completed form and faxed back to Lenora at [phone].

## 2020-08-20 NOTE — Assessment & Plan Note (Signed)
Not sign of fluid overload. Continue current meds.

## 2020-08-20 NOTE — Assessment & Plan Note (Addendum)
Order DME oxygen to use continuously 2 L [place] given O2Sats dropping below threshold with minimal ambulation.  Oxygen Testing During Exercise: 1. Room Air Resting Pulse Oximetry: 92% 2. Room Air Pulse Oximetry During Exercise: 86% 3. Pulse Oximertry on 2 LPM )2 via Nasal Cannula During Exercise 96%

## 2020-08-20 NOTE — Telephone Encounter (Addendum)
Office note and DME O2 orders faxed to [place] at [phone]

## 2020-08-20 NOTE — Assessment & Plan Note (Signed)
Resolving, S?P Remdesivir,baricitinib Solumedrol and prednisone but continued oxygen requirement with ambulation.

## 2020-08-20 NOTE — Telephone Encounter (Signed)
Caleb Bryant left v/m that oxygen order was received but needs additional info added to order; Anderson Malta request documentation on oxygen order the liter flow and a full copy of office note faxed to [phone].

## 2020-09-20 DIAGNOSIS — J1282 Pneumonia due to coronavirus disease 2019: Secondary | ICD-10-CM | POA: Diagnosis not present

## 2020-09-20 DIAGNOSIS — R0902 Hypoxemia: Secondary | ICD-10-CM | POA: Diagnosis not present

## 2020-10-05 ENCOUNTER — Other Ambulatory Visit: Payer: Self-pay

## 2020-10-05 ENCOUNTER — Ambulatory Visit (INDEPENDENT_AMBULATORY_CARE_PROVIDER_SITE_OTHER): Payer: PPO | Admitting: Family Medicine

## 2020-10-05 ENCOUNTER — Encounter: Payer: Self-pay | Admitting: Family Medicine

## 2020-10-05 ENCOUNTER — Ambulatory Visit (INDEPENDENT_AMBULATORY_CARE_PROVIDER_SITE_OTHER)
Admission: RE | Admit: 2020-10-05 | Discharge: 2020-10-05 | Disposition: A | Discharge status: Home / Self Care | Payer: PPO | Source: Ambulatory Visit | Attending: Family Medicine | Admitting: Family Medicine

## 2020-10-05 VITALS — BP 110/74 | HR 80 | Temp 97.6°F | Ht 65.0 in | Wt 167.0 lb

## 2020-10-05 DIAGNOSIS — R0902 Hypoxemia: Secondary | ICD-10-CM

## 2020-10-05 DIAGNOSIS — U071 COVID-19: Secondary | ICD-10-CM

## 2020-10-05 DIAGNOSIS — J1282 Pneumonia due to coronavirus disease 2019: Secondary | ICD-10-CM

## 2020-10-05 DIAGNOSIS — I4510 Unspecified right bundle-branch block: Secondary | ICD-10-CM | POA: Diagnosis not present

## 2020-10-05 DIAGNOSIS — R0602 Shortness of breath: Secondary | ICD-10-CM

## 2020-10-05 DIAGNOSIS — Z8616 Personal history of COVID-19: Secondary | ICD-10-CM

## 2020-10-05 DIAGNOSIS — I491 Atrial premature depolarization: Secondary | ICD-10-CM

## 2020-10-05 DIAGNOSIS — J449 Chronic obstructive pulmonary disease, unspecified: Secondary | ICD-10-CM

## 2020-10-05 NOTE — Progress Notes (Signed)
Chief Complaint  Patient presents with  . Follow-up    Covid    History of Present Illness: HPI  86 year old male with COPD,  Pulmonary fibrosis, DM, HTN presents following COVID infection with pnemonia in with continued shortness of breath and oxygen requirement over 2 months out from active infection.   Admitted 07/30/2020 to 08/03/2020 Positive test on 07/19/2020 He was treated with IV solumedrol, remdesivir, MAB infusion and prednisone.  He reports that over the last month he has noted improvement in time...  oxygen drops to 84% with walking off oxygen but jumped back up to 90% quickly. Occ chest pain, sitting or at rest.... gets better after a minute... feels better He is coughing in AM... productive... if this gets moving it helps him breath better.  He is still very tired overall  He has lost 10 lbs in last couple of months.. decreased appetite. Wt Readings from Last 3 Encounters:  10/05/20 167 lb (75.8 kg)  08/13/20 170 lb (77.1 kg)  07/30/20 175 lb 14.8 oz (79.8 kg)    Last  Saw Dr. Fletcher Anon in 01/2020.. note reviewed. Recommended Stress test but pt was not interested. He has known history of atrial flutter status post ablation.   He has known history of type 2 diabetes not on medications, previous tobacco use, hypertension and hyperlipidemia.  He was diagnosed with atrial flutter in January 2015 with cardiomyopathy and ejection fraction of 25 to 30%.  He underwent cardiac catheterization which showed minor irregularities with no obstructive disease. He underwent catheter ablation by Dr. Lovena Le in February 7106 with no complications.  Ejection fraction improved gradually in most recent echocardiogram in August 2016 showed normal LV systolic function.  This visit occurred during the SARS-CoV-2 public health emergency.  Safety protocols were in place, including screening questions prior to the visit, additional usage of staff PPE, and extensive cleaning of exam room while  observing appropriate contact time as indicated for disinfecting solutions.   COVID 19 screen:  No recent travel or known exposure to COVID19 The patient denies respiratory symptoms of COVID 19 at this time. The importance of social distancing was discussed today.     Review of Systems  Constitutional: Negative for chills and fever.  HENT: Negative for congestion and ear pain.   Eyes: Negative for pain and redness.  Respiratory: Positive for cough, sputum production and shortness of breath.   Cardiovascular: Positive for chest pain. Negative for palpitations and leg swelling.  Gastrointestinal: Negative for abdominal pain, blood in stool, constipation, diarrhea, nausea and vomiting.  Genitourinary: Negative for dysuria.  Musculoskeletal: Negative for falls and myalgias.  Skin: Negative for rash.  Neurological: Negative for dizziness.  Psychiatric/Behavioral: Negative for depression. The patient is not nervous/anxious.       Past Medical History:  Diagnosis Date  . Anxiety   . Atrial flutter ([place])    "just dx'd today" (11/04/2013)  . Benign prostatic hypertrophy   . Borderline diabetes   . Colon, diverticulosis   . COPD (chronic obstructive pulmonary disease) ([place])   . Dysrhythmia   . Exertional shortness of breath   . Gout   . Hyperlipidemia   . Hypertension   . Osteoarthritis    "all over" (11/04/2013)  . PVD (peripheral vascular disease) ([place])     reports that he has quit smoking. His smoking use included cigarettes. He has a 90.00 pack-year smoking history. He has quit using smokeless tobacco.  His smokeless tobacco use included chew. He reports that  he does not drink alcohol and does not use drugs.   Current Outpatient Medications:  .  albuterol (VENTOLIN HFA) 108 (90 Base) MCG/ACT inhaler, Inhale 2 puffs into the lungs every 4 (four) hours as needed for shortness of breath or wheezing., Disp: 18 g, Rfl: 0 .  allopurinol (ZYLOPRIM) 100 MG tablet, TAKE 1 TABLET(100 MG) BY  MOUTH DAILY, Disp: 90 tablet, Rfl: 3 .  ascorbic acid (VITAMIN C) 500 MG tablet, Take 1 tablet (500 mg total) by mouth daily., Disp: 30 tablet, Rfl: 0 .  aspirin 81 MG chewable tablet, Chew by mouth daily., Disp: , Rfl:  .  atorvastatin (LIPITOR) 20 MG tablet, Take one table by mouth daily, Disp: 90 tablet, Rfl: 3 .  chlorpheniramine-HYDROcodone (TUSSIONEX) 10-8 MG/5ML SUER, Take 5 mLs by mouth every 12 (twelve) hours as needed for cough., Disp: 70 mL, Rfl: 0 .  cholecalciferol (VITAMIN D) 25 MCG tablet, Take 1 tablet (1,000 Units total) by mouth daily., Disp: 30 tablet, Rfl: 0 .  Cyanocobalamin (B-12 PO), Take 1,000 mcg by mouth daily. , Disp: , Rfl:  .  fluorometholone (FML) 0.1 % ophthalmic suspension, Place 1 drop into both eyes 2 (two) times daily. , Disp: , Rfl:  .  hydrochlorothiazide (HYDRODIURIL) 25 MG tablet, Take one tablet by mouth daily (Patient taking differently: Take 25 mg by mouth daily. ), Disp: 90 tablet, Rfl: 3 .  methocarbamol (ROBAXIN) 500 MG tablet, Take 500 mg by mouth 3 (three) times daily as needed for muscle spasms. , Disp: , Rfl:  .  potassium chloride SA (KLOR-CON) 10 MEQ tablet, Take 2 tablets (20 mEq total) by mouth daily., Disp: 30 tablet, Rfl: 0 .  zinc sulfate 220 (50 Zn) MG capsule, Take 1 capsule (220 mg total) by mouth daily., Disp: 30 capsule, Rfl: 0   Observations/Objective: Pulse 80, temperature 97.6 F (36.4 C), temperature source Temporal, height _0  (1.651 m), weight 167 lb (75.8 kg), SpO2 90 %.    EKG: no change from 07/29/3020... frequent PAC  And RBBB  Physical Exam Constitutional:      General: Vital signs are normal.     Appearance: He is well-developed and well-nourished.  HENT:     Head: Normocephalic.     Right Ear: Hearing normal.     Left Ear: Hearing normal.     Nose: Nose normal.     Mouth/Throat:     Mouth: Oropharynx is clear and moist and mucous membranes are normal.  Neck:     Thyroid: No thyroid mass or thyromegaly.      Vascular: No carotid bruit.     Trachea: Trachea normal.  Cardiovascular:     Rate and Rhythm: Normal rate and regular rhythm.     Pulses: Normal pulses.     Heart sounds: Heart sounds not distant. No murmur heard. No friction rub. No gallop.      Comments: No peripheral edema Pulmonary:     Effort: Pulmonary effort is normal. No respiratory distress.     Breath sounds: Examination of the right-lower field reveals rales. Examination of the left-lower field reveals rales. Rales present.  Skin:    General: Skin is warm, dry and intact.     Findings: No rash.  Psychiatric:        Mood and Affect: Mood and affect normal.        Speech: Speech normal.        Behavior: Behavior normal.        Thought Content:  Thought content normal.      Assessment and Plan Problem List Items Addressed This Visit    Chronic obstructive pulmonary disease ([place])    Possibly contributing to current SOB and chest pain.      Relevant Orders   DG Chest 2 View (Completed)   Hypoxia - Primary    Continued hypoxia.Marland Kitchen unclear if post COVID or due to pulmonary fibrosis diagnosed in past. No S/S of DVT or PE.  EKG showed  no change from 07/29/3020... frequent PAC  And RBBB.  Will eval for addditonal secondary cause including new anemia etc.  Continue oxygen.  Refer to pulmonary for further eval.      Relevant Orders   DG Chest 2 View (Completed)   Pneumonia due to COVID-19 virus    Eval with CXR for possible continued infection.       Other Visit Diagnoses    Shortness of breath       Relevant Orders   DG Chest 2 View (Completed)   EKG 12-Lead (Completed)   Comprehensive metabolic panel (Completed)   CBC with Differential/Platelet (Completed)   TSH (Completed)   History of COVID-19       Relevant Orders   DG Chest 2 View (Completed)   PAC (premature atrial contraction)       RBBB         Orders Placed This Encounter  Procedures  . DG Chest 2 View    Standing Status:   Future    Number of  Occurrences:   1    Standing Expiration Date:   10/05/2021    Order Specific Question:   Reason for Exam (SYMPTOM  OR DIAGNOSIS REQUIRED)    Answer:   COVID pneumonia in 06/2020... with continued oxygen requirement and SOB, hx of COPD.    Order Specific Question:   Preferred imaging location?    Answer:   [facility]  . Comprehensive metabolic panel  . CBC with Differential/Platelet  . TSH  . EKG 12-Lead     Eliezer Lofts, MD

## 2020-10-05 NOTE — Patient Instructions (Signed)
Please stop at the lab to have labs drawn. We  will call with lab and X-ray results. Please wear oxygen when up and walking or exerting your self.

## 2020-10-06 LAB — COMPREHENSIVE METABOLIC PANEL
ALT: 19 U/L (ref 0–53)
AST: 24 U/L (ref 0–37)
Albumin: 4.3 g/dL (ref 3.5–5.2)
Alkaline Phosphatase: 84 U/L (ref 39–117)
BUN: 20 mg/dL (ref 6–23)
CO2: 31 mEq/L (ref 19–32)
Calcium: 10.3 mg/dL (ref 8.4–10.5)
Chloride: 96 mEq/L (ref 96–112)
Creatinine, Ser: 1.44 mg/dL (ref 0.40–1.50)
GFR: 44.53 mL/min — ABNORMAL LOW (ref >60.00)
Glucose, Bld: 107 mg/dL — ABNORMAL HIGH (ref 70–99)
Potassium: 4.6 mEq/L (ref 3.5–5.1)
Sodium: 138 mEq/L (ref 135–145)
Total Bilirubin: 1.2 mg/dL (ref 0.2–1.2)
Total Protein: 7.4 g/dL (ref 6.0–8.3)

## 2020-10-06 LAB — CBC WITH DIFFERENTIAL/PLATELET
Basophils Absolute: 0.1 10*3/uL (ref 0.0–0.1)
Basophils Relative: 0.7 % (ref 0.0–3.0)
Eosinophils Absolute: 0.4 10*3/uL (ref 0.0–0.7)
Eosinophils Relative: 3.3 % (ref 0.0–5.0)
HCT: 48.3 % (ref 39.0–52.0)
Hemoglobin: 16.1 g/dL (ref 13.0–17.0)
Lymphocytes Relative: 28.9 % (ref 12.0–46.0)
Lymphs Abs: 3.6 10*3/uL (ref 0.7–4.0)
MCHC: 33.3 g/dL (ref 30.0–36.0)
MCV: 94.7 fl (ref 78.0–100.0)
Monocytes Absolute: 1.1 10*3/uL — ABNORMAL HIGH (ref 0.1–1.0)
Monocytes Relative: 9.1 % (ref 3.0–12.0)
Neutro Abs: 7.2 10*3/uL (ref 1.4–7.7)
Neutrophils Relative %: 58 % (ref 43.0–77.0)
Platelets: 351 10*3/uL (ref 150.0–400.0)
RBC: 5.1 Mil/uL (ref 4.22–5.81)
RDW: 14.6 % (ref 11.5–15.5)
WBC: 12.5 10*3/uL — ABNORMAL HIGH (ref 4.0–10.5)

## 2020-10-06 LAB — TSH: TSH: 1.56 u[IU]/mL (ref 0.35–4.50)

## 2020-10-08 ENCOUNTER — Other Ambulatory Visit: Payer: Self-pay | Admitting: Family Medicine

## 2020-10-08 DIAGNOSIS — J84112 Idiopathic pulmonary fibrosis: Secondary | ICD-10-CM

## 2020-10-08 DIAGNOSIS — R0902 Hypoxemia: Secondary | ICD-10-CM

## 2020-10-08 DIAGNOSIS — R0602 Shortness of breath: Secondary | ICD-10-CM

## 2020-10-08 NOTE — Progress Notes (Signed)
Referrals placed

## 2020-10-13 ENCOUNTER — Other Ambulatory Visit: Payer: Self-pay

## 2020-10-13 ENCOUNTER — Ambulatory Visit
Admission: RE | Admit: 2020-10-13 | Discharge: 2020-10-13 | Disposition: A | Discharge status: Home / Self Care | Payer: PPO | Source: Ambulatory Visit | Attending: Family Medicine | Admitting: Family Medicine

## 2020-10-13 DIAGNOSIS — R0602 Shortness of breath: Secondary | ICD-10-CM | POA: Insufficient documentation

## 2020-10-13 DIAGNOSIS — J84112 Idiopathic pulmonary fibrosis: Secondary | ICD-10-CM

## 2020-10-13 DIAGNOSIS — I712 Thoracic aortic aneurysm, without rupture: Secondary | ICD-10-CM | POA: Diagnosis not present

## 2020-10-13 DIAGNOSIS — R0902 Hypoxemia: Secondary | ICD-10-CM | POA: Diagnosis not present

## 2020-10-13 DIAGNOSIS — I2510 Atherosclerotic heart disease of native coronary artery without angina pectoris: Secondary | ICD-10-CM | POA: Diagnosis not present

## 2020-10-13 DIAGNOSIS — J439 Emphysema, unspecified: Secondary | ICD-10-CM | POA: Diagnosis not present

## 2020-10-13 DIAGNOSIS — J8489 Other specified interstitial pulmonary diseases: Secondary | ICD-10-CM | POA: Diagnosis not present

## 2020-10-20 ENCOUNTER — Telehealth: Payer: Self-pay | Admitting: *Deleted

## 2020-10-20 DIAGNOSIS — J1282 Pneumonia due to coronavirus disease 2019: Secondary | ICD-10-CM | POA: Diagnosis not present

## 2020-10-20 DIAGNOSIS — R0902 Hypoxemia: Secondary | ICD-10-CM | POA: Diagnosis not present

## 2020-10-20 NOTE — Telephone Encounter (Signed)
See CT result note.  I have already spoken with Mrs. Zawislak early today to discuss results.

## 2020-10-20 NOTE — Telephone Encounter (Signed)
Patient's wife left a voicemail requesting her husband's recent test results.

## 2020-10-20 NOTE — Telephone Encounter (Signed)
Please see note from where I tried to call patient last night. If they still have questions let me know and I can call them.

## 2020-10-27 DIAGNOSIS — K5792 Diverticulitis of intestine, part unspecified, without perforation or abscess without bleeding: Secondary | ICD-10-CM | POA: Diagnosis not present

## 2020-10-28 ENCOUNTER — Ambulatory Visit
Admission: RE | Admit: 2020-10-28 | Discharge: 2020-10-28 | Disposition: A | Discharge status: Home / Self Care | Payer: PPO | Source: Ambulatory Visit | Attending: Gastroenterology | Admitting: Gastroenterology

## 2020-10-28 ENCOUNTER — Other Ambulatory Visit: Payer: Self-pay

## 2020-10-28 ENCOUNTER — Other Ambulatory Visit: Payer: Self-pay | Admitting: Gastroenterology

## 2020-10-28 DIAGNOSIS — R109 Unspecified abdominal pain: Secondary | ICD-10-CM

## 2020-10-28 DIAGNOSIS — K5792 Diverticulitis of intestine, part unspecified, without perforation or abscess without bleeding: Secondary | ICD-10-CM | POA: Diagnosis not present

## 2020-10-28 DIAGNOSIS — D72829 Elevated white blood cell count, unspecified: Secondary | ICD-10-CM | POA: Diagnosis not present

## 2020-10-28 MED ORDER — IOHEXOL 300 MG/ML  SOLN
75.0000 mL | Freq: Once | INTRAMUSCULAR | Status: AC | PRN
  Administered 2020-10-28: 13:00:00 75 mL via INTRAVENOUS

## 2020-11-02 ENCOUNTER — Telehealth: Payer: PPO | Admitting: Family Medicine

## 2020-11-02 DIAGNOSIS — K529 Noninfective gastroenteritis and colitis, unspecified: Secondary | ICD-10-CM | POA: Diagnosis not present

## 2020-11-16 ENCOUNTER — Other Ambulatory Visit
Admission: RE | Admit: 2020-11-16 | Discharge: 2020-11-16 | Disposition: A | Discharge status: Home / Self Care | Payer: PPO | Source: Ambulatory Visit | Attending: Pulmonary Disease | Admitting: Pulmonary Disease

## 2020-11-16 ENCOUNTER — Ambulatory Visit: Payer: PPO | Admitting: Pulmonary Disease

## 2020-11-16 ENCOUNTER — Encounter: Payer: Self-pay | Admitting: Pulmonary Disease

## 2020-11-16 ENCOUNTER — Other Ambulatory Visit: Payer: Self-pay

## 2020-11-16 VITALS — BP 122/74 | HR 100 | Temp 97.1°F | Ht 65.0 in | Wt 168.2 lb

## 2020-11-16 DIAGNOSIS — J9611 Chronic respiratory failure with hypoxia: Secondary | ICD-10-CM | POA: Diagnosis not present

## 2020-11-16 DIAGNOSIS — J8410 Pulmonary fibrosis, unspecified: Secondary | ICD-10-CM

## 2020-11-16 DIAGNOSIS — R011 Cardiac murmur, unspecified: Secondary | ICD-10-CM

## 2020-11-16 DIAGNOSIS — R0602 Shortness of breath: Secondary | ICD-10-CM | POA: Diagnosis not present

## 2020-11-16 LAB — C-REACTIVE PROTEIN: CRP: 0.6 mg/dL (ref <1.0)

## 2020-11-16 MED ORDER — BREZTRI AEROSPHERE 160-9-4.8 MCG/ACT IN AERO: 2.0000 | INHALATION_SPRAY | Freq: Two times a day (BID) | RESPIRATORY_TRACT | 0 refills | Status: AC

## 2020-11-16 NOTE — Patient Instructions (Signed)
We are going to get breathing tests we are also going to get a heart test to evaluate your murmur  You have scarring on the lungs but you also have evidence of COPD/emphysema  We are giving you a trial of inhaler this will be 2 puffs twice a day.  We are giving you a spacer so that the medication gets deep into your lungs.  You can start a supplement called NAC ( N-acetylcysteine) 600 mg, 1 capsule twice a day this is an anti-inflammatory that you can find at health food stores.  Sometimes you need to get it online from places like the [employer], [employer] or [employer].  We will see you in follow-up in 2 months time.

## 2020-11-16 NOTE — Progress Notes (Signed)
Subjective:    Patient ID: Caleb Flott Sr., male    DOB: [birth year], 87 y.o.   MRN: [id number]  HPI The patient is an 87 year old former smoker (quit 1980, 90 pack years total) who presents for evaluation of shortness of breath and oxygen dependency.  He is kindly referred by Dr. Eliezer Lofts.  The patient states that he has noted shortness of breath for several years since around 2017 however after a infection with COVID-19 in October 2021 he became oxygen dependent.  He has noted oxygen desaturations with activity if he is not wearing oxygen and increasing shortness of breath.  He was admitted to [facility] in October during his COVID-19 infection and received remdesivir for 5 days.  He notices that his dyspnea is relieved with the oxygen and with rest.  He does not note that albuterol helps him much.  He has been diagnosed previously with COPD and pulmonary fibrosis though the patient is not aware of the latter diagnosis.  Increased cough or sputum production.  No hemoptysis.  No fevers, chills or sweats.  He does have significant joint pain from time to time occasional dysphagia and reflux symptoms with heartburn that is quite frequent.  He does have palpitations which have been present for several years.  He has had a history of atrial flutter in the past and status post ablation.  He had issues with cardiomyopathy with ejection fraction 25 to 30% in 2015 however on a subsequent echocardiogram in August 2016 it showed that his LV function had normalized to 5 to 60%.  Review of his available data shows that CT scan of 2017 shows that he has had some fibrotic changes since 2013 with no change in 2017.  This was a nonspecific interstitial lung disease pattern with peripheral distribution there was also bullous changes noted.  No honeycombing, did not meet the criteria for IPF.  This may be related to occupational exposure.  He has not had formal PFTs.  He did have spirometry performed on 25 January 2016 this was read as moderate restriction however reviewing the data it appears that he has a combined restrictive/obstructive defect however formal PFTs would be needed to sort this out.  Most recent CT scan of 13 October 2020 was obtained after his COVID-19 infection.  This shows progressive interstitial lung disease now with a pattern more consistent with UIP/pulmonary fibrosis.   DATA: 01/25/2016 spirometry: FEV1 1.7 L or 62% predicted, FVC 2.4 L or 64% predicted.  FEV1/FVC 73%.  FEF 25-75% 1.4 L or 56% predicted.  Suspect combined restrictive/obstructive physiology. 01/28/2016 CT chest: Nonspecific interstitial lung disease present with peripheral distribution. 10/13/2020 CT chest: Marked interval progression of underlying interstitial lung disease demonstrating peripheral and basilar predominant architectural distortion, honeycombing and subpleural reticulation.  Basilar groundglass pulmonary infiltrates, bullous emphysema, findings consistent with UIP plus emphysema.  Review of Systems A 10 point review of systems was performed and it is as noted above otherwise negative.  Past Medical History:  Diagnosis Date  . Anxiety   . Atrial flutter ([place])    "just dx'd today" (11/04/2013)  . Benign prostatic hypertrophy   . Borderline diabetes   . Colon, diverticulosis   . COPD (chronic obstructive pulmonary disease) ([place])   . Dysrhythmia   . Exertional shortness of breath   . Gout   . Hyperlipidemia   . Hypertension   . Osteoarthritis    "all over" (11/04/2013)  . PVD (peripheral vascular disease) (H[facility])    Past  Surgical History:  Procedure Laterality Date  . albation  11/04/13  . ANKLE FRACTURE SURGERY Right   . APPENDECTOMY  1997   "days before 1st colon OR"  . ATRIAL FLUTTER ABLATION N/A 11/26/2013   Procedure: ATRIAL FLUTTER ABLATION;  Surgeon: Evans Lance, MD;  Location: [facility] CATH LAB;  Service: Cardiovascular;  Laterality: N/A;  . CATARACT EXTRACTION, BILATERAL Bilateral   .  CHOLECYSTECTOMY  12/03  . COLON SURGERY     "diverticulitis"  . COLONOSCOPY WITH ESOPHAGOGASTRODUODENOSCOPY (EGD)  12/00   gastic polyp   . COLONOSCOPY WITH ESOPHAGOGASTRODUODENOSCOPY (EGD)  11/07,12/2011   gastritis, 2 gastic polyps  . COLOSTOMY  1997  . COLOSTOMY REVERSAL     "think I wore it 4 months" (11/04/2013)  . CORNEAL TRANSPLANT Right   . CORONARY ANGIOGRAM  11/05/2013   Procedure: CORONARY ANGIOGRAM;  Surgeon: Wellington Hampshire, MD;  Location: [facility] CATH LAB;  Service: Cardiovascular;;  . ESOPHAGOGASTRODUODENOSCOPY (EGD) WITH PROPOFOL N/A 08/13/2019   Procedure: ESOPHAGOGASTRODUODENOSCOPY (EGD) WITH PROPOFOL;  Surgeon: Robert Bellow, MD;  Location: [facility] ENDOSCOPY;  Service: Endoscopy;  Laterality: N/A;  . HERNIA REPAIR  11/05/14   ventral hernia  . INGUINAL HERNIA REPAIR Left   . PARTIAL COLECTOMY  1997   "diverticulitis; busted; [facility]"  . stress cardiolite negative  9/02   EF 60%  . TONSILLECTOMY AND ADENOIDECTOMY     child  . VENTRAL HERNIA REPAIR  11/07   [place]   Family History  Problem Relation Age of Onset  . Coronary artery disease Sister   . Heart failure Mother    Social History   Tobacco Use  . Smoking status: Former Smoker    Packs/day: 3.00    Years: 30.00    Pack years: 90.00    Types: Cigarettes    Quit date: 1980    Years since quitting: 42.0  . Smokeless tobacco: Former Systems developer    Types: Chew  . Tobacco comment: 11/04/2013 "quit smoking in the 1980's or so; quit chewing before I quit smoking"  Substance Use Topics  . Alcohol use: No    Comment: 11/04/2013 "quit driking in 1980's; was called a weekend alcoholic"   No known military history.  Has resided in [place] and [place] previously.  Has worked on [employer], exposed to [employer] and asbestos.  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  Allergies  Allergen Reactions  . Codeine Nausea And Vomiting   Current Meds   Medication Sig  . albuterol (VENTOLIN HFA) 108 (90 Base) MCG/ACT inhaler Inhale 2 puffs into the lungs every 4 (four) hours as needed for shortness of breath or wheezing.  Marland Kitchen allopurinol (ZYLOPRIM) 100 MG tablet TAKE 1 TABLET(100 MG) BY MOUTH DAILY  . ascorbic acid (VITAMIN C) 500 MG tablet Take 1 tablet (500 mg total) by mouth daily.  Marland Kitchen aspirin 81 MG chewable tablet Chew by mouth daily.  Marland Kitchen atorvastatin (LIPITOR) 20 MG tablet Take one table by mouth daily  . cholecalciferol (VITAMIN D) 25 MCG tablet Take 1 tablet (1,000 Units total) by mouth daily.  . Cyanocobalamin (B-12 PO) Take 1,000 mcg by mouth daily.   . hydrochlorothiazide (HYDRODIURIL) 25 MG tablet Take one tablet by mouth daily (Patient taking differently: Take 25 mg by mouth daily.)  . methocarbamol (ROBAXIN) 500 MG tablet Take 500 mg by mouth 3 (three) times daily as needed for muscle spasms.   Marland Kitchen zinc sulfate 220 (  50 Zn) MG capsule Take 1 capsule (220 mg total) by mouth daily.   Immunization History  Administered Date(s) Administered  . Influenza Split 07/31/2011, 08/08/2012, 09/01/2013  . Influenza Whole 10/05/2004, 08/16/2007, 08/11/2008, 08/06/2009  . Influenza, High Dose Seasonal PF 07/27/2016, 08/09/2017, 08/01/2018, 07/25/2019, 07/09/2020  . Influenza,inj,Quad PF,6+ Mos 06/22/2014, 10/14/2015  . Influenza-Unspecified 08/30/2018  . Pneumococcal Conjugate-13 06/22/2014  . Pneumococcal Polysaccharide-23 04/14/2009  . Td 10/31/1997, 05/13/2010, 12/05/2019        Objective:   Physical Exam BP 122/74 (BP Location: Left Arm, Cuff Size: Normal)   Pulse 100   Temp (!) 97.1 F (36.2 C) (Temporal)   Ht 5' 5" (1.651 m)   Wt 168 lb 3.2 oz (76.3 kg)   SpO2 95%   BMI 27.99 kg/m  GENERAL: Well-developed well-nourished gentleman, no acute distress on 2 L nasal cannula O2.  No conversational dyspnea. HEAD: Normocephalic, atraumatic.  EYES: Pupils equal, round, reactive to light.  No scleral icterus.  Corneal opacity  right. MOUTH: Nose/mouth/throat not examined due to masking requirements for COVID 19. NECK: Supple. No thyromegaly. Trachea midline. No JVD.  No adenopathy. PULMONARY: Good air entry bilaterally.  Bilateral Velcro crackles at the bases, no adventitious sounds. CARDIOVASCULAR: S1 and S2. Regular rate and rhythm.  Grade 2/6 systolic ejection murmur at the aortic area.  No gallops or rubs noted. ABDOMEN: Benign. MUSCULOSKELETAL: Osteoarthritis changes both hands, no clubbing, no edema.  NEUROLOGIC: No focal deficit, no gait disturbance, fluent speech  SKIN: Intact,warm,dry.  On limited exam no rashes. PSYCH: Mood and behavior normal.  Representative slice of chest CT performed in 01/28/2016    Representative slice of CT performed 13 October 2020      Assessment & Plan:     ICD-10-CM   1. Shortness of breath  R06.02 Pulmonary Function Test [facility] Only   Likely related to interstitial lung disease/pulmonary fibrosis He likely has element of COPD as well Ruleout pulmonary hypertension Trial of Breztri for COPD  2. Chronic respiratory failure with hypoxia (HCC)  J96.11    Continue oxygen at 2 L/min This is related to his pulmonary fibrosis  3. Pulmonary fibrosis (HCC)  J84.10 C-reactive protein    ANA w/Reflex    ANCA screen with reflex titer    Sedimentation rate    Rheumatoid factor   Obtain connective tissue disease screen PFTs Noted since at least 2013  4. Heart murmur  R01.1 ECHOCARDIOGRAM COMPLETE   2D echo to eval murmur Rule out pulmonary hypertension    Orders Placed This Encounter  Procedures  . C-reactive protein    Standing Status:   Future    Number of Occurrences:   1    Standing Expiration Date:   11/16/2021  . ANA w/Reflex    Standing Status:   Future    Number of Occurrences:   1    Standing Expiration Date:   11/16/2021  . ANCA screen with reflex titer    Standing Status:   Future    Number of Occurrences:   1    Standing Expiration Date:   11/16/2021   . Sedimentation rate    Standing Status:   Future    Number of Occurrences:   1    Standing Expiration Date:   11/16/2021  . Rheumatoid factor    Standing Status:   Future    Number of Occurrences:   1    Standing Expiration Date:   11/16/2021  . Pulmonary Function Test [facility] Only  Standing Status:   Future    Standing Expiration Date:   11/16/2021    Scheduling Instructions:     Next available.    Order Specific Question:   Full PFT: includes the following: basic spirometry, spirometry pre & post bronchodilator, diffusion capacity (DLCO), lung volumes    Answer:   Full PFT  . ECHOCARDIOGRAM COMPLETE    Standing Status:   Future    Standing Expiration Date:   05/16/2021    Scheduling Instructions:     Next available    Order Specific Question:   Where should this test be performed    Answer:   [facility]    Order Specific Question:   Perflutren DEFINITY (image enhancing agent) should be administered unless hypersensitivity or allergy exist    Answer:   Administer Perflutren    Order Specific Question:   Reason for exam-Echo    Answer:   Murmur  785.2 / R01.1   Meds ordered this encounter  Medications  . Budeson-Glycopyrrol-Formoterol (BREZTRI AEROSPHERE) 160-9-4.8 MCG/ACT AERO    Sig: Inhale 2 puffs into the lungs in the morning and at bedtime for 1 day.    Dispense:  10.7 g    Refill:  0    Order Specific Question:   Lot Number?    Answer:   [id number]    Order Specific Question:   Expiration Date?    Answer:   12/28/2021    Order Specific Question:   Manufacturer?    Answer:   [employer]stra[employer] [[id number]]    Order Specific Question:   Quantity    Answer:   3   Discussion:  Patient has been noted to have some interstitial lung disease changes since at least 2013.  This appeared to be stable in 2017 however since then has progressed.  It is uncertain to what extent COVID-19 accelerated this process.  Regardless, patient now is oxygen dependent and suspect that this will remain so.   He does have evidence of emphysema as well on CT.  We will give him a trial of Breztri 2 puffs twice a day for the COPD component, he was provided a spacer for use with the inhaler.  He was instructed to rinse his mouth well after use.  We are obtaining PFTs and 2D echo as well as connective tissue screen to evaluate his interstitial lung disease.  We will start supplementation with N-acetylcysteine (NAC) 600 mg 1 capsule twice a day.  We will see him in follow-up in 2 months time he is to contact us prior to that time should any new difficulties arise.  Renold Don, MD [facility]   *This note was dictated using voice recognition software/Dragon.  Despite best efforts to proofread, errors can occur which can change the meaning.  Any change was purely unintentional.

## 2020-11-17 LAB — ANA W/REFLEX: Anti Nuclear Antibody (ANA): NEGATIVE

## 2020-11-17 LAB — SEDIMENTATION RATE: Sed Rate: 3 mm/hr (ref 0–20)

## 2020-11-17 LAB — RHEUMATOID FACTOR: Rheumatoid fact SerPl-aCnc: 14.7 IU/mL — ABNORMAL HIGH (ref <14.0)

## 2020-11-20 DIAGNOSIS — R0902 Hypoxemia: Secondary | ICD-10-CM | POA: Diagnosis not present

## 2020-11-20 DIAGNOSIS — J1282 Pneumonia due to coronavirus disease 2019: Secondary | ICD-10-CM | POA: Diagnosis not present

## 2020-11-22 ENCOUNTER — Encounter: Payer: Self-pay | Admitting: Pulmonary Disease

## 2020-11-29 ENCOUNTER — Telehealth: Payer: Self-pay

## 2020-11-29 NOTE — Telephone Encounter (Signed)
ATC patient to remind him of covid test prior to PFT.  Unable to leave vm due to mailbox being full.  Will call back.

## 2020-11-30 ENCOUNTER — Ambulatory Visit (INDEPENDENT_AMBULATORY_CARE_PROVIDER_SITE_OTHER): Payer: PPO

## 2020-11-30 ENCOUNTER — Other Ambulatory Visit: Payer: Self-pay

## 2020-11-30 DIAGNOSIS — R011 Cardiac murmur, unspecified: Secondary | ICD-10-CM | POA: Diagnosis not present

## 2020-11-30 LAB — ECHOCARDIOGRAM COMPLETE
AR max vel: 3.05 cm2
AV Area VTI: 2.64 cm2
AV Area mean vel: 2.59 cm2
AV Mean grad: 3 mmHg
AV Peak grad: 4.8 mmHg
Ao pk vel: 1.09 m/s
Area-P 1/2: 4.89 cm2
Calc EF: 25.9 %
S' Lateral: 3.8 cm
Single Plane A2C EF: 23.6 %
Single Plane A4C EF: 28.7 %

## 2020-11-30 NOTE — Telephone Encounter (Signed)
Lm for patient on home number on file.

## 2020-11-30 NOTE — Telephone Encounter (Signed)
Informed Caleb Bryant wife for patient to get Covid test prior to PFT.

## 2020-12-01 ENCOUNTER — Telehealth: Payer: Self-pay | Admitting: Pulmonary Disease

## 2020-12-01 NOTE — Telephone Encounter (Signed)
Caleb Pita, MD  12/01/2020 8:42 AM EST      His heart is very weak. He sees Dr. Fletcher Anon and I think he needs to see him soon he should make an appointment. With the heart being weak this can actually worsen his shortness of breath. I recommend an appointment with Dr. Fletcher Anon ASAP.   Patient's spouse, [name] is aware of results and voiced her understanding.  [name] will contact Dr. Tyrell Antonio office to schedule an appointment.  Nothing further needed.

## 2020-12-02 ENCOUNTER — Other Ambulatory Visit: Payer: Self-pay

## 2020-12-02 ENCOUNTER — Other Ambulatory Visit
Admission: RE | Admit: 2020-12-02 | Discharge: 2020-12-02 | Disposition: A | Discharge status: Home / Self Care | Payer: PPO | Source: Ambulatory Visit | Attending: Pulmonary Disease | Admitting: Pulmonary Disease

## 2020-12-02 DIAGNOSIS — Z20822 Contact with and (suspected) exposure to covid-19: Secondary | ICD-10-CM | POA: Insufficient documentation

## 2020-12-02 DIAGNOSIS — Z01812 Encounter for preprocedural laboratory examination: Secondary | ICD-10-CM | POA: Insufficient documentation

## 2020-12-02 LAB — SARS CORONAVIRUS 2 (TAT 6-24 HRS): SARS Coronavirus 2: NEGATIVE

## 2020-12-03 ENCOUNTER — Ambulatory Visit: Payer: PPO | Attending: Pulmonary Disease

## 2020-12-03 ENCOUNTER — Other Ambulatory Visit: Payer: Self-pay

## 2020-12-03 DIAGNOSIS — R0602 Shortness of breath: Secondary | ICD-10-CM | POA: Diagnosis not present

## 2020-12-03 DIAGNOSIS — R0600 Dyspnea, unspecified: Secondary | ICD-10-CM | POA: Insufficient documentation

## 2020-12-03 DIAGNOSIS — R059 Cough, unspecified: Secondary | ICD-10-CM | POA: Diagnosis not present

## 2020-12-03 DIAGNOSIS — Z87891 Personal history of nicotine dependence: Secondary | ICD-10-CM | POA: Diagnosis not present

## 2020-12-03 MED ORDER — ALBUTEROL SULFATE (2.5 MG/3ML) 0.083% IN NEBU
2.5000 mg | INHALATION_SOLUTION | Freq: Once | RESPIRATORY_TRACT | Status: AC
  Administered 2020-12-03: 2.5 mg via RESPIRATORY_TRACT
  Filled 2020-12-03: qty 3

## 2020-12-06 LAB — PULMONARY FUNCTION TEST ARMC ONLY
DL/VA % pred: 56 %
DL/VA: 2.2 ml/min/mmHg/L
DLCO unc % pred: 34 %
DLCO unc: 7.04 ml/min/mmHg
FEF 25-75 Post: 0.85 L/sec
FEF 25-75 Pre: 1.55 L/sec
FEF2575-%Change-Post: -45 %
FEF2575-%Pred-Post: 64 %
FEF2575-%Pred-Pre: 117 %
FEV1-%Change-Post: -14 %
FEV1-%Pred-Post: 65 %
FEV1-%Pred-Pre: 77 %
FEV1-Post: 1.38 L
FEV1-Pre: 1.62 L
FEV1FVC-%Change-Post: -14 %
FEV1FVC-%Pred-Pre: 113 %
FEV6-%Change-Post: -3 %
FEV6-%Pred-Post: 69 %
FEV6-%Pred-Pre: 72 %
FEV6-Post: 1.95 L
FEV6-Pre: 2.01 L
FEV6FVC-%Pred-Post: 109 %
FEV6FVC-%Pred-Pre: 109 %
FVC-%Change-Post: 0 %
FVC-%Pred-Post: 66 %
FVC-%Pred-Pre: 66 %
FVC-Post: 2.02 L
Post FEV1/FVC ratio: 68 %
Post FEV6/FVC ratio: 100 %
Pre FEV1/FVC ratio: 80 %
Pre FEV6/FVC Ratio: 100 %
RV % pred: 36 %
RV: 0.9 L
TLC % pred: 47 %
TLC: 2.88 L

## 2020-12-07 ENCOUNTER — Ambulatory Visit: Payer: PPO | Admitting: Cardiovascular Disease

## 2020-12-08 ENCOUNTER — Telehealth: Payer: Self-pay | Admitting: Pulmonary Disease

## 2020-12-08 MED ORDER — BREZTRI AEROSPHERE 160-9-4.8 MCG/ACT IN AERO: 2.0000 | INHALATION_SPRAY | Freq: Two times a day (BID) | RESPIRATORY_TRACT | 0 refills | Status: DC

## 2020-12-08 NOTE — Telephone Encounter (Signed)
Results of PFTs are usually discussed during follow-up visit.  He does have have restrictive defect meaning that his lungs do not expand like they should due to scarring that is noted on the CT.  This is from prior scarring and also likely residual from COVID-19 as well.  His shortness of breath breath is due to weak lungs and heart.

## 2020-12-08 NOTE — Telephone Encounter (Signed)
2 samples of Breztri has been placed up front pickup.  Patient's spouse, [name] is aware and voiced her understanding.  Nothing further needed.

## 2020-12-08 NOTE — Telephone Encounter (Signed)
[name] is calling since the pt has a cardiology appt on 2/10 she is wanting the results of the PFT that was done on the pt.  CG please advise. Thanks

## 2020-12-08 NOTE — Telephone Encounter (Signed)
Called and spoke with pt's wife Caleb Bryant letting her know the info stated by Dr. Darnell Level about the PFT results. Caleb Bryant verbalized understanding. Caleb Bryant stated that pt was given samples of Breztri at last OV and she states that pt is on the last sample that was given. Caleb Bryant states that she is not sure if this will last until pt's OV with Dr. Darnell Level in March. She states that she is not sure if the inhaler is really working for pt.   Due to pt possibly running out of the [place] prior to OV, Caleb Bryant wants to know what might be able to be recommended, also since she is not sure if this is really working. She states that pt does have the albuterol inhaler that he uses as needed.  Dr. Darnell Level, please advise on this for pt and wife Caleb Bryant.

## 2020-12-08 NOTE — Telephone Encounter (Signed)
Lm for patient's wife, [name].

## 2020-12-08 NOTE — Telephone Encounter (Signed)
Pt wife returning missed call. [phone]

## 2020-12-08 NOTE — Telephone Encounter (Signed)
We can certainly leave samples of Breztri for him to pick up.  We can discuss further on follow-up.

## 2020-12-09 ENCOUNTER — Other Ambulatory Visit: Payer: Self-pay

## 2020-12-09 ENCOUNTER — Encounter: Payer: Self-pay | Admitting: Cardiovascular Disease

## 2020-12-09 ENCOUNTER — Ambulatory Visit: Payer: PPO | Admitting: Cardiovascular Disease

## 2020-12-09 VITALS — BP 102/68 | HR 86 | Ht 65.0 in | Wt 170.0 lb

## 2020-12-09 DIAGNOSIS — I5021 Acute systolic (congestive) heart failure: Secondary | ICD-10-CM | POA: Diagnosis not present

## 2020-12-09 DIAGNOSIS — J8410 Pulmonary fibrosis, unspecified: Secondary | ICD-10-CM | POA: Diagnosis not present

## 2020-12-09 DIAGNOSIS — I7121 Aneurysm of the ascending aorta, without rupture: Secondary | ICD-10-CM

## 2020-12-09 DIAGNOSIS — Z8679 Personal history of other diseases of the circulatory system: Secondary | ICD-10-CM

## 2020-12-09 DIAGNOSIS — I10 Essential (primary) hypertension: Secondary | ICD-10-CM

## 2020-12-09 DIAGNOSIS — I712 Thoracic aortic aneurysm, without rupture: Secondary | ICD-10-CM | POA: Diagnosis not present

## 2020-12-09 MED ORDER — LOSARTAN POTASSIUM 25 MG PO TABS: 25.0000 mg | ORAL_TABLET | Freq: Every day | ORAL | 5 refills | Status: DC

## 2020-12-09 MED ORDER — METOPROLOL SUCCINATE ER 25 MG PO TB24: 25.0000 mg | ORAL_TABLET | Freq: Every day | ORAL | 5 refills | Status: DC

## 2020-12-09 NOTE — Patient Instructions (Addendum)
Medication Instructions:  Your physician has recommended you make the following change in your medication:    1) STOP HCTZ  2) START Losartan 25 mg daily. An Rx has been sent to your pharmacy.  3) START Metoprolol Succinate 25 mg daily.an Rx has been sent to your pharmacy.  *If you need a refill on your cardiac medications before your next appointment, please call your pharmacy*   Lab Work: Bmet and Cbc today. If you have labs (blood work) drawn today and your tests are completely normal, you will receive your results only by: Marland Kitchen MyChart Message (if you have MyChart) OR . A paper copy in the mail If you have any lab test that is abnormal or we need to change your treatment, we will call you to review the results.   Testing/Procedures: Your physician has requested that you have a cardiac catheterization. Cardiac catheterization is used to diagnose and/or treat various heart conditions. Doctors may recommend this procedure for a number of different reasons. The most common reason is to evaluate chest pain. Chest pain can be a symptom of coronary artery disease (CAD), and cardiac catheterization can show whether plaque is narrowing or blocking your heart's arteries. This procedure is also used to evaluate the valves, as well as measure the blood flow and oxygen levels in different parts of your heart. For further information please visit [email or web address]. Please follow instruction sheet, as given.     Follow-Up: At [facility], you and your health needs are our priority.  As part of our continuing mission to provide you with exceptional heart care, we have created designated Provider Care Teams.  These Care Teams include your primary Cardiologist (physician) and Advanced Practice Providers (APPs -  Physician Assistants and Nurse Practitioners) who all work together to provide you with the care you need, when you need it.  We recommend signing up for the patient portal called  "MyChart".  Sign up information is provided on this After Visit Summary.  MyChart is used to connect with patients for Virtual Visits (Telemedicine).  Patients are able to view lab/test results, encounter notes, upcoming appointments, etc.  Non-urgent messages can be sent to your provider as well.   To learn more about what you can do with MyChart, go to [email or web address].    Your next appointment:   4 week(s)  The format for your next appointment:   In Person  Provider:   You may see [name], MD or one of the following Advanced Practice Providers on your designated Care Team:    Murray Hodgkins, NP  Christell Faith, PA-C  Marrianne Mood, PA-C  Cadence Kathlen Mody, [place]  Laurann Montana, NP    Other Instructions    [place] [address] [place] [address] Dept: [phone] Loc: [phone]  Hammad Finkler Gruber Sr.  12/09/2020  You are scheduled for a Cardiac Catheterization on Friday, February 18 with Dr. [name].  1. Please arrive at the [facility] at 9:30 AM (This time is one hour before your procedure to ensure your preparation). Free valet parking service is available.   Special note: Every effort is made to have your procedure done on time. Please understand that emergencies sometimes delay scheduled procedures.  2. Diet: Do not eat solid foods after midnight.  The patient may have clear liquids until 5am upon the day of the procedure.  3. Labs: You will need to have blood drawn on  today (bmp,cbc).  You will need a COVID test on Wed 12/16/19. Please report to the [facility] medical arts building drive up test site between 8am-1pm. Please self quarantine after the test up to the day of the procedure.    4. Medication instructions in preparation for your procedure:   Contrast Allergy: No   On the morning of your procedure, take your Aspirin and any  morning medicines NOT listed above.  You may use sips of water.  5. Plan for one night stay--bring personal belongings. 6. Bring a current list of your medications and current insurance cards. 7. You MUST have a responsible person to drive you home. 8. Someone MUST be with you the first 24 hours after you arrive home or your discharge will be delayed. 9. Please wear clothes that are easy to get on and off and wear slip-on shoes.  Thank you for allowing [place] to care for you!   -- [facility]

## 2020-12-09 NOTE — Assessment & Plan Note (Signed)
Possibly contributing to current SOB and chest pain.

## 2020-12-09 NOTE — Assessment & Plan Note (Addendum)
Eval with CXR for possible continued infection.

## 2020-12-09 NOTE — Assessment & Plan Note (Addendum)
Continued hypoxia.Marland Kitchen unclear if post COVID or due to pulmonary fibrosis diagnosed in past. No S/S of DVT or PE.  EKG showed  no change from 07/29/3020... frequent PAC  And RBBB.  Will eval for addditonal secondary cause including new anemia etc.  Continue oxygen.  Refer to pulmonary for further eval.

## 2020-12-09 NOTE — H&P (View-Only) (Signed)
Cardiology Office Note   Date:  12/09/2020   ID:  Angela Nevin Barocio Sr., DOB 1934-10-03, MRN [id number]  PCP:  [name], MD  Cardiologist:   [name], MD   Chief Complaint  Patient presents with  . Follow-up    Echo results      History of Present Illness: Josede Cicero Sr. is a 87 y.o. male who is here today for follow-up visit regarding atrial arrhythmia and chronic systolic heart failure.  He has known history of atrial flutter status post ablation.   He has known history of type 2 diabetes not on medications, previous tobacco use, hypertension and hyperlipidemia.  He was diagnosed with atrial flutter in January 2015 with cardiomyopathy and ejection fraction of 25 to 30%.  He underwent cardiac catheterization which showed minor irregularities with no obstructive disease. He underwent catheter ablation by Dr. Lovena Le in February 4695 with no complications.  Ejection fraction improved gradually with echocardiogram in August 2016 showing normal LV systolic function. He is also known to have small ascending aortic aneurysm at 4.1 cm.  He has history of pulmonary fibrosis.  He used to work in a [employer].   He was most recently seen by me in April 2021 and he was complaining of worsening exertional dyspnea.  I ordered an echocardiogram but it does not appear that it was done. He was hospitalized in October with COVID-19 infection and received remdesivir.  He has been following with [name] and pulmonary.  He had an echocardiogram done recently which showed an EF of 25 to 30% with global hypokinesis, mild aortic stenosis and mildly dilated ascending aorta. He reports that his breathing got significantly worse after he was hospitalized with Covid.  In addition, he has been having exertional substernal chest tightness with palpitations and shortness of breath.  He feels very limited.  He is currently on 2 L of oxygen and sometimes has to increase to 3 L.  He has  orthopnea but no leg edema.  Past Medical History:  Diagnosis Date  . Anxiety   . Atrial flutter ([place])    "just dx'd today" (11/04/2013)  . Benign prostatic hypertrophy   . Borderline diabetes   . Colon, diverticulosis   . COPD (chronic obstructive pulmonary disease) ([place])   . Dysrhythmia   . Exertional shortness of breath   . Gout   . Hyperlipidemia   . Hypertension   . Osteoarthritis    "all over" (11/04/2013)  . PVD (peripheral vascular disease) ([place])     Past Surgical History:  Procedure Laterality Date  . albation  11/04/13  . ANKLE FRACTURE SURGERY Right   . APPENDECTOMY  1997   "days before 1st colon OR"  . ATRIAL FLUTTER ABLATION N/A 11/26/2013   Procedure: ATRIAL FLUTTER ABLATION;  Surgeon: Evans Lance, MD;  Location: [facility] CATH LAB;  Service: Cardiovascular;  Laterality: N/A;  . CATARACT EXTRACTION, BILATERAL Bilateral   . CHOLECYSTECTOMY  12/03  . COLON SURGERY     "diverticulitis"  . COLONOSCOPY WITH ESOPHAGOGASTRODUODENOSCOPY (EGD)  12/00   gastic polyp   . COLONOSCOPY WITH ESOPHAGOGASTRODUODENOSCOPY (EGD)  11/07,12/2011   gastritis, 2 gastic polyps  . COLOSTOMY  1997  . COLOSTOMY REVERSAL     "think I wore it 4 months" (11/04/2013)  . CORNEAL TRANSPLANT Right   . CORONARY ANGIOGRAM  11/05/2013   Procedure: CORONARY ANGIOGRAM;  Surgeon: Wellington Hampshire, MD;  Location: [place] CATH LAB;  Service: Cardiovascular;;  .  ESOPHAGOGASTRODUODENOSCOPY (EGD) WITH PROPOFOL N/A 08/13/2019   Procedure: ESOPHAGOGASTRODUODENOSCOPY (EGD) WITH PROPOFOL;  Surgeon: Robert Bellow, MD;  Location: [facility] ENDOSCOPY;  Service: Endoscopy;  Laterality: N/A;  . HERNIA REPAIR  11/05/14   ventral hernia  . INGUINAL HERNIA REPAIR Left   . PARTIAL COLECTOMY  1997   "diverticulitis; busted; [facility]"  . stress cardiolite negative  9/02   EF 60%  . TONSILLECTOMY AND ADENOIDECTOMY     child  . VENTRAL HERNIA REPAIR  11/07   [facility]     Current Outpatient Medications  Medication Sig Dispense  Refill  . albuterol (VENTOLIN HFA) 108 (90 Base) MCG/ACT inhaler Inhale 2 puffs into the lungs every 4 (four) hours as needed for shortness of breath or wheezing. 18 g 0  . allopurinol (ZYLOPRIM) 100 MG tablet TAKE 1 TABLET(100 MG) BY MOUTH DAILY 90 tablet 3  . ascorbic acid (VITAMIN C) 500 MG tablet Take 1 tablet (500 mg total) by mouth daily. 30 tablet 0  . aspirin 81 MG chewable tablet Chew by mouth daily.    Marland Kitchen atorvastatin (LIPITOR) 20 MG tablet Take one table by mouth daily 90 tablet 3  . Budeson-Glycopyrrol-Formoterol (BREZTRI AEROSPHERE) 160-9-4.8 MCG/ACT AERO Inhale 2 puffs into the lungs in the morning and at bedtime for 1 day. 10.7 g 0  . cholecalciferol (VITAMIN D) 25 MCG tablet Take 1 tablet (1,000 Units total) by mouth daily. 30 tablet 0  . Cyanocobalamin (B-12 PO) Take 1,000 mcg by mouth daily.     . hydrochlorothiazide (HYDRODIURIL) 25 MG tablet Take one tablet by mouth daily (Patient taking differently: Take 25 mg by mouth daily.) 90 tablet 3  . zinc sulfate 220 (50 Zn) MG capsule Take 1 capsule (220 mg total) by mouth daily. 30 capsule 0   No current facility-administered medications for this visit.    Allergies:   Codeine    Social History:  The patient  reports that he quit smoking about 42 years ago. His smoking use included cigarettes. He has a 90.00 pack-year smoking history. He has quit using smokeless tobacco.  His smokeless tobacco use included chew. He reports that he does not drink alcohol and does not use drugs.   Family History:  The patient's family history includes Coronary artery disease in his sister; Heart failure in his mother.    ROS:  Please see the history of present illness.   Otherwise, review of systems are positive for none.   All other systems are reviewed and negative.    PHYSICAL EXAM: VS:  BP 102/68   Pulse 86   Ht _0  (1.651 m)   Wt 170 lb (77.1 kg)   BMI 28.29 kg/m  , BMI Body mass index is 28.29 kg/m. GEN: Well nourished, well  developed, in no acute distress  HEENT: normal  Neck: no JVD, carotid bruits, or masses Cardiac: RRR; no  rubs, or gallops,no edema . 1/6 systolic ejection murmur in the aortic area Respiratory: Bilateral dry crackles throughout lung fields consistent with pulmonary fibrosis. GI: soft, nontender, nondistended, + BS MS: no deformity or atrophy  Skin: warm and dry, no rash Neuro:  Strength and sensation are intact Psych: euthymic mood, full affect   EKG:  EKG is ordered today. The ekg ordered today demonstrates sinus rhythm with sinus arrhythmia and PVCs.  Right bundle branch block with right ventricular hypertrophy and minimal LVH.   Recent Labs: 07/30/2020: B Natriuretic Peptide 64.0 08/02/2020: Magnesium 2.1 10/05/2020: ALT 19; BUN 20; Creatinine, Ser  1.44; Hemoglobin 16.1; Platelets 351.0; Potassium 4.6; Sodium 138; TSH 1.56    Lipid Panel    Component Value Date/Time   CHOL 119 12/05/2019 1006   TRIG 55.0 12/05/2019 1006   HDL 60.60 12/05/2019 1006   CHOLHDL 2 12/05/2019 1006   VLDL 11.0 12/05/2019 1006   LDLCALC 48 12/05/2019 1006      Wt Readings from Last 3 Encounters:  12/09/20 170 lb (77.1 kg)  11/16/20 168 lb 3.2 oz (76.3 kg)  10/05/20 167 lb (75.8 kg)       PAD Screen 02/05/2020  Previous PAD dx? No  Previous surgical procedure? No  Pain with walking? No  Feet/toe relief with dangling? No  Painful, non-healing ulcers? No  Extremities discolored? No      ASSESSMENT AND PLAN:  1.  Acute systolic heart failure with severely reduced LV systolic function: The patient has history of tachycardia induced cardiomyopathy in 2015 with subsequent normalization of EF.  He has no evidence of atrial arrhythmia at the present time to explain his worsening LV systolic function.  He appears to be euvolemic by exam.  I do suspect that his dyspnea is multifactorial due to his extensive lung disease as well as cardiomyopathy.  I think we have to exclude regression of coronary  artery disease as a culprit for his worsening EF.  Due to that, I recommend proceeding with right and left cardiac catheterization and possible PCI.  I discussed the procedure in details as well as risks and benefits. In terms of his medications, I elected to discontinue hydrochlorothiazide and switch him to small dose Toprol 25 mg daily and losartan 25 mg once daily.  I suspect that his blood pressure would be too low for Entresto.   2. History of atrial flutter status post successful ablation: No evidence of recurrent arrhythmia.  3.  Small ascending aortic aneurysm: No need for surveillance as I do not think he will be a candidate for surgery in the future.  4.   Essential hypertension: Blood pressure is on the low side.  I made changes as outlined above due to his cardiomyopathy.  6.  Pulmonary fibrosis: Followed by [name] and pulmonary.   Disposition:   FU with me in 1 month  Signed,  [name], MD  12/09/2020 9:17 AM    [facility]

## 2020-12-09 NOTE — Progress Notes (Signed)
Cardiology Office Note   Date:  12/09/2020   ID:  Caleb Bryant Sr., DOB 1934-10-03, MRN [id number]  PCP:  [name], MD  Cardiologist:   [name], MD   Chief Complaint  Patient presents with  . Follow-up    Echo results      History of Present Illness: Caleb Cicero Sr. is a 87 y.o. male who is here today for follow-up visit regarding atrial arrhythmia and chronic systolic heart failure.  He has known history of atrial flutter status post ablation.   He has known history of type 2 diabetes not on medications, previous tobacco use, hypertension and hyperlipidemia.  He was diagnosed with atrial flutter in January 2015 with cardiomyopathy and ejection fraction of 25 to 30%.  He underwent cardiac catheterization which showed minor irregularities with no obstructive disease. He underwent catheter ablation by Dr. Lovena Le in February 4695 with no complications.  Ejection fraction improved gradually with echocardiogram in August 2016 showing normal LV systolic function. He is also known to have small ascending aortic aneurysm at 4.1 cm.  He has history of pulmonary fibrosis.  He used to work in a [employer].   He was most recently seen by me in April 2021 and he was complaining of worsening exertional dyspnea.  I ordered an echocardiogram but it does not appear that it was done. He was hospitalized in October with COVID-19 infection and received remdesivir.  He has been following with [name] and pulmonary.  He had an echocardiogram done recently which showed an EF of 25 to 30% with global hypokinesis, mild aortic stenosis and mildly dilated ascending aorta. He reports that his breathing got significantly worse after he was hospitalized with Covid.  In addition, he has been having exertional substernal chest tightness with palpitations and shortness of breath.  He feels very limited.  He is currently on 2 L of oxygen and sometimes has to increase to 3 L.  He has  orthopnea but no leg edema.  Past Medical History:  Diagnosis Date  . Anxiety   . Atrial flutter ([place])    "just dx'd today" (11/04/2013)  . Benign prostatic hypertrophy   . Borderline diabetes   . Colon, diverticulosis   . COPD (chronic obstructive pulmonary disease) ([place])   . Dysrhythmia   . Exertional shortness of breath   . Gout   . Hyperlipidemia   . Hypertension   . Osteoarthritis    "all over" (11/04/2013)  . PVD (peripheral vascular disease) ([place])     Past Surgical History:  Procedure Laterality Date  . albation  11/04/13  . ANKLE FRACTURE SURGERY Right   . APPENDECTOMY  1997   "days before 1st colon OR"  . ATRIAL FLUTTER ABLATION N/A 11/26/2013   Procedure: ATRIAL FLUTTER ABLATION;  Surgeon: Evans Lance, MD;  Location: [facility] CATH LAB;  Service: Cardiovascular;  Laterality: N/A;  . CATARACT EXTRACTION, BILATERAL Bilateral   . CHOLECYSTECTOMY  12/03  . COLON SURGERY     "diverticulitis"  . COLONOSCOPY WITH ESOPHAGOGASTRODUODENOSCOPY (EGD)  12/00   gastic polyp   . COLONOSCOPY WITH ESOPHAGOGASTRODUODENOSCOPY (EGD)  11/07,12/2011   gastritis, 2 gastic polyps  . COLOSTOMY  1997  . COLOSTOMY REVERSAL     "think I wore it 4 months" (11/04/2013)  . CORNEAL TRANSPLANT Right   . CORONARY ANGIOGRAM  11/05/2013   Procedure: CORONARY ANGIOGRAM;  Surgeon: Wellington Hampshire, MD;  Location: [place] CATH LAB;  Service: Cardiovascular;;  .  ESOPHAGOGASTRODUODENOSCOPY (EGD) WITH PROPOFOL N/A 08/13/2019   Procedure: ESOPHAGOGASTRODUODENOSCOPY (EGD) WITH PROPOFOL;  Surgeon: Robert Bellow, MD;  Location: [facility] ENDOSCOPY;  Service: Endoscopy;  Laterality: N/A;  . HERNIA REPAIR  11/05/14   ventral hernia  . INGUINAL HERNIA REPAIR Left   . PARTIAL COLECTOMY  1997   "diverticulitis; busted; [facility]"  . stress cardiolite negative  9/02   EF 60%  . TONSILLECTOMY AND ADENOIDECTOMY     child  . VENTRAL HERNIA REPAIR  11/07   [facility]     Current Outpatient Medications  Medication Sig Dispense  Refill  . albuterol (VENTOLIN HFA) 108 (90 Base) MCG/ACT inhaler Inhale 2 puffs into the lungs every 4 (four) hours as needed for shortness of breath or wheezing. 18 g 0  . allopurinol (ZYLOPRIM) 100 MG tablet TAKE 1 TABLET(100 MG) BY MOUTH DAILY 90 tablet 3  . ascorbic acid (VITAMIN C) 500 MG tablet Take 1 tablet (500 mg total) by mouth daily. 30 tablet 0  . aspirin 81 MG chewable tablet Chew by mouth daily.    Marland Kitchen atorvastatin (LIPITOR) 20 MG tablet Take one table by mouth daily 90 tablet 3  . Budeson-Glycopyrrol-Formoterol (BREZTRI AEROSPHERE) 160-9-4.8 MCG/ACT AERO Inhale 2 puffs into the lungs in the morning and at bedtime for 1 day. 10.7 g 0  . cholecalciferol (VITAMIN D) 25 MCG tablet Take 1 tablet (1,000 Units total) by mouth daily. 30 tablet 0  . Cyanocobalamin (B-12 PO) Take 1,000 mcg by mouth daily.     . hydrochlorothiazide (HYDRODIURIL) 25 MG tablet Take one tablet by mouth daily (Patient taking differently: Take 25 mg by mouth daily.) 90 tablet 3  . zinc sulfate 220 (50 Zn) MG capsule Take 1 capsule (220 mg total) by mouth daily. 30 capsule 0   No current facility-administered medications for this visit.    Allergies:   Codeine    Social History:  The patient  reports that he quit smoking about 42 years ago. His smoking use included cigarettes. He has a 90.00 pack-year smoking history. He has quit using smokeless tobacco.  His smokeless tobacco use included chew. He reports that he does not drink alcohol and does not use drugs.   Family History:  The patient's family history includes Coronary artery disease in his sister; Heart failure in his mother.    ROS:  Please see the history of present illness.   Otherwise, review of systems are positive for none.   All other systems are reviewed and negative.    PHYSICAL EXAM: VS:  BP 102/68   Pulse 86   Ht _0  (1.651 m)   Wt 170 lb (77.1 kg)   BMI 28.29 kg/m  , BMI Body mass index is 28.29 kg/m. GEN: Well nourished, well  developed, in no acute distress  HEENT: normal  Neck: no JVD, carotid bruits, or masses Cardiac: RRR; no  rubs, or gallops,no edema . 1/6 systolic ejection murmur in the aortic area Respiratory: Bilateral dry crackles throughout lung fields consistent with pulmonary fibrosis. GI: soft, nontender, nondistended, + BS MS: no deformity or atrophy  Skin: warm and dry, no rash Neuro:  Strength and sensation are intact Psych: euthymic mood, full affect   EKG:  EKG is ordered today. The ekg ordered today demonstrates sinus rhythm with sinus arrhythmia and PVCs.  Right bundle branch block with right ventricular hypertrophy and minimal LVH.   Recent Labs: 07/30/2020: B Natriuretic Peptide 64.0 08/02/2020: Magnesium 2.1 10/05/2020: ALT 19; BUN 20; Creatinine, Ser  1.44; Hemoglobin 16.1; Platelets 351.0; Potassium 4.6; Sodium 138; TSH 1.56    Lipid Panel    Component Value Date/Time   CHOL 119 12/05/2019 1006   TRIG 55.0 12/05/2019 1006   HDL 60.60 12/05/2019 1006   CHOLHDL 2 12/05/2019 1006   VLDL 11.0 12/05/2019 1006   LDLCALC 48 12/05/2019 1006      Wt Readings from Last 3 Encounters:  12/09/20 170 lb (77.1 kg)  11/16/20 168 lb 3.2 oz (76.3 kg)  10/05/20 167 lb (75.8 kg)       PAD Screen 02/05/2020  Previous PAD dx? No  Previous surgical procedure? No  Pain with walking? No  Feet/toe relief with dangling? No  Painful, non-healing ulcers? No  Extremities discolored? No      ASSESSMENT AND PLAN:  1.  Acute systolic heart failure with severely reduced LV systolic function: The patient has history of tachycardia induced cardiomyopathy in 2015 with subsequent normalization of EF.  He has no evidence of atrial arrhythmia at the present time to explain his worsening LV systolic function.  He appears to be euvolemic by exam.  I do suspect that his dyspnea is multifactorial due to his extensive lung disease as well as cardiomyopathy.  I think we have to exclude regression of coronary  artery disease as a culprit for his worsening EF.  Due to that, I recommend proceeding with right and left cardiac catheterization and possible PCI.  I discussed the procedure in details as well as risks and benefits. In terms of his medications, I elected to discontinue hydrochlorothiazide and switch him to small dose Toprol 25 mg daily and losartan 25 mg once daily.  I suspect that his blood pressure would be too low for Entresto.   2. History of atrial flutter status post successful ablation: No evidence of recurrent arrhythmia.  3.  Small ascending aortic aneurysm: No need for surveillance as I do not think he will be a candidate for surgery in the future.  4.   Essential hypertension: Blood pressure is on the low side.  I made changes as outlined above due to his cardiomyopathy.  6.  Pulmonary fibrosis: Followed by [name] and pulmonary.   Disposition:   FU with me in 1 month  Signed,  [name], MD  12/09/2020 9:17 AM    [facility]

## 2020-12-10 LAB — CBC WITH DIFFERENTIAL/PLATELET
Basophils Absolute: 0.1 10*3/uL (ref 0.0–0.2)
Basos: 1 %
EOS (ABSOLUTE): 0.4 10*3/uL (ref 0.0–0.4)
Eos: 4 %
Hematocrit: 41 % (ref 37.5–51.0)
Hemoglobin: 14.2 g/dL (ref 13.0–17.7)
Immature Grans (Abs): 0 10*3/uL (ref 0.0–0.1)
Immature Granulocytes: 0 %
Lymphocytes Absolute: 2 10*3/uL (ref 0.7–3.1)
Lymphs: 18 %
MCH: 32.9 pg (ref 26.6–33.0)
MCHC: 34.6 g/dL (ref 31.5–35.7)
MCV: 95 fL (ref 79–97)
Monocytes Absolute: 1 10*3/uL — ABNORMAL HIGH (ref 0.1–0.9)
Monocytes: 9 %
Neutrophils Absolute: 8.1 10*3/uL — ABNORMAL HIGH (ref 1.4–7.0)
Neutrophils: 68 %
Platelets: 272 10*3/uL (ref 150–450)
RBC: 4.32 x10E6/uL (ref 4.14–5.80)
RDW: 13.5 % (ref 11.6–15.4)
WBC: 11.7 10*3/uL — ABNORMAL HIGH (ref 3.4–10.8)

## 2020-12-10 LAB — BASIC METABOLIC PANEL
BUN/Creatinine Ratio: 19 (ref 10–24)
BUN: 23 mg/dL (ref 8–27)
CO2: 27 mmol/L (ref 20–29)
Calcium: 9.5 mg/dL (ref 8.6–10.2)
Chloride: 100 mmol/L (ref 96–106)
Creatinine, Ser: 1.23 mg/dL (ref 0.76–1.27)
GFR calc Af Amer: 62 mL/min/{1.73_m2} (ref >59)
GFR calc non Af Amer: 54 mL/min/{1.73_m2} — ABNORMAL LOW (ref >59)
Glucose: 103 mg/dL — ABNORMAL HIGH (ref 65–99)
Potassium: 4 mmol/L (ref 3.5–5.2)
Sodium: 142 mmol/L (ref 134–144)

## 2020-12-15 ENCOUNTER — Telehealth: Payer: Self-pay | Admitting: Pulmonary Disease

## 2020-12-15 ENCOUNTER — Other Ambulatory Visit
Admission: RE | Admit: 2020-12-15 | Discharge: 2020-12-15 | Disposition: A | Discharge status: Home / Self Care | Payer: PPO | Source: Ambulatory Visit | Attending: Cardiovascular Disease | Admitting: Cardiovascular Disease

## 2020-12-15 ENCOUNTER — Other Ambulatory Visit: Payer: Self-pay

## 2020-12-15 DIAGNOSIS — Z20822 Contact with and (suspected) exposure to covid-19: Secondary | ICD-10-CM | POA: Diagnosis not present

## 2020-12-15 DIAGNOSIS — Z01812 Encounter for preprocedural laboratory examination: Secondary | ICD-10-CM | POA: Insufficient documentation

## 2020-12-15 DIAGNOSIS — J9611 Chronic respiratory failure with hypoxia: Secondary | ICD-10-CM

## 2020-12-15 NOTE — Telephone Encounter (Signed)
Lm for patient's spouse, [name]

## 2020-12-16 LAB — SARS CORONAVIRUS 2 (TAT 6-24 HRS): SARS Coronavirus 2: NEGATIVE

## 2020-12-16 NOTE — Telephone Encounter (Signed)
Spoke to patient's spouse, [name].  [name] is requesting a order for continuous oxygen, as patient was instructed to wear oxygen at all times.   [name], please advise if okay to place an order to [employer]. Oxygen was prescribed by PCP.

## 2020-12-17 ENCOUNTER — Encounter: Payer: Self-pay | Admitting: Cardiovascular Disease

## 2020-12-17 ENCOUNTER — Encounter
Admission: RE | Disposition: A | Discharge status: Home / Self Care | Payer: Self-pay | Source: Home / Self Care | Attending: Cardiovascular Disease

## 2020-12-17 ENCOUNTER — Ambulatory Visit
Admission: RE | Admit: 2020-12-17 | Discharge: 2020-12-17 | Disposition: A | Discharge status: Home / Self Care | Payer: PPO | Attending: Cardiovascular Disease | Admitting: Cardiovascular Disease

## 2020-12-17 ENCOUNTER — Other Ambulatory Visit: Payer: Self-pay

## 2020-12-17 ENCOUNTER — Telehealth (HOSPITAL_COMMUNITY): Payer: Self-pay | Admitting: Vascular Surgery

## 2020-12-17 DIAGNOSIS — Z885 Allergy status to narcotic agent status: Secondary | ICD-10-CM | POA: Diagnosis not present

## 2020-12-17 DIAGNOSIS — I5021 Acute systolic (congestive) heart failure: Secondary | ICD-10-CM | POA: Diagnosis not present

## 2020-12-17 DIAGNOSIS — I272 Pulmonary hypertension, unspecified: Secondary | ICD-10-CM | POA: Diagnosis not present

## 2020-12-17 DIAGNOSIS — Z7982 Long term (current) use of aspirin: Secondary | ICD-10-CM | POA: Diagnosis not present

## 2020-12-17 DIAGNOSIS — I2510 Atherosclerotic heart disease of native coronary artery without angina pectoris: Secondary | ICD-10-CM | POA: Diagnosis not present

## 2020-12-17 DIAGNOSIS — I5023 Acute on chronic systolic (congestive) heart failure: Secondary | ICD-10-CM | POA: Diagnosis not present

## 2020-12-17 DIAGNOSIS — Z87891 Personal history of nicotine dependence: Secondary | ICD-10-CM | POA: Insufficient documentation

## 2020-12-17 DIAGNOSIS — I712 Thoracic aortic aneurysm, without rupture: Secondary | ICD-10-CM | POA: Diagnosis not present

## 2020-12-17 DIAGNOSIS — Z79899 Other long term (current) drug therapy: Secondary | ICD-10-CM | POA: Diagnosis not present

## 2020-12-17 DIAGNOSIS — J8410 Pulmonary fibrosis, unspecified: Secondary | ICD-10-CM | POA: Insufficient documentation

## 2020-12-17 DIAGNOSIS — I428 Other cardiomyopathies: Secondary | ICD-10-CM | POA: Insufficient documentation

## 2020-12-17 DIAGNOSIS — I110 Hypertensive heart disease with heart failure: Secondary | ICD-10-CM | POA: Diagnosis not present

## 2020-12-17 DIAGNOSIS — Z8249 Family history of ischemic heart disease and other diseases of the circulatory system: Secondary | ICD-10-CM | POA: Insufficient documentation

## 2020-12-17 HISTORY — PX: RIGHT/LEFT HEART CATH AND CORONARY ANGIOGRAPHY: CATH118266

## 2020-12-17 SURGERY — RIGHT/LEFT HEART CATH AND CORONARY ANGIOGRAPHY
Anesthesia: Moderate Sedation | Laterality: Bilateral

## 2020-12-17 MED ORDER — SODIUM CHLORIDE 0.9% FLUSH: 3.0000 mL | Freq: Two times a day (BID) | INTRAVENOUS | Status: DC

## 2020-12-17 MED ORDER — IOHEXOL 300 MG/ML  SOLN
INTRAMUSCULAR | Status: DC | PRN
  Administered 2020-12-17: 50 mL

## 2020-12-17 MED ORDER — ASPIRIN 81 MG PO CHEW: 81.0000 mg | CHEWABLE_TABLET | ORAL | Status: DC

## 2020-12-17 MED ORDER — MIDAZOLAM HCL 2 MG/2ML IJ SOLN
INTRAMUSCULAR | Status: AC
  Filled 2020-12-17: qty 2

## 2020-12-17 MED ORDER — VERAPAMIL HCL 2.5 MG/ML IV SOLN
INTRAVENOUS | Status: AC
  Filled 2020-12-17: qty 2

## 2020-12-17 MED ORDER — SODIUM CHLORIDE 0.9 % IV SOLN: 250.0000 mL | INTRAVENOUS | Status: DC | PRN

## 2020-12-17 MED ORDER — HEPARIN (PORCINE) IN NACL 1000-0.9 UT/500ML-% IV SOLN
INTRAVENOUS | Status: DC | PRN
  Administered 2020-12-17: 500 mL

## 2020-12-17 MED ORDER — LIDOCAINE HCL (PF) 1 % IJ SOLN
INTRAMUSCULAR | Status: AC
  Filled 2020-12-17: qty 30

## 2020-12-17 MED ORDER — HEPARIN SODIUM (PORCINE) 1000 UNIT/ML IJ SOLN
INTRAMUSCULAR | Status: DC | PRN
  Administered 2020-12-17: 3500 [IU] via INTRAVENOUS
  Administered 2020-12-17: 2000 [IU] via INTRAVENOUS

## 2020-12-17 MED ORDER — FENTANYL CITRATE (PF) 100 MCG/2ML IJ SOLN
INTRAMUSCULAR | Status: AC
  Filled 2020-12-17: qty 2

## 2020-12-17 MED ORDER — HEPARIN (PORCINE) IN NACL 1000-0.9 UT/500ML-% IV SOLN
INTRAVENOUS | Status: AC
  Filled 2020-12-17: qty 1000

## 2020-12-17 MED ORDER — ACETAMINOPHEN 325 MG PO TABS: 650.0000 mg | ORAL_TABLET | ORAL | Status: DC | PRN

## 2020-12-17 MED ORDER — LIDOCAINE HCL (PF) 1 % IJ SOLN
INTRAMUSCULAR | Status: DC | PRN
  Administered 2020-12-17: 2 mL

## 2020-12-17 MED ORDER — SODIUM CHLORIDE 0.9% FLUSH: 3.0000 mL | INTRAVENOUS | Status: DC | PRN

## 2020-12-17 MED ORDER — HEPARIN SODIUM (PORCINE) 1000 UNIT/ML IJ SOLN
INTRAMUSCULAR | Status: AC
  Filled 2020-12-17: qty 1

## 2020-12-17 MED ORDER — MIDAZOLAM HCL 2 MG/2ML IJ SOLN
INTRAMUSCULAR | Status: DC | PRN
  Administered 2020-12-17 (×2): 1 mg via INTRAVENOUS

## 2020-12-17 MED ORDER — ONDANSETRON HCL 4 MG/2ML IJ SOLN: 4.0000 mg | Freq: Four times a day (QID) | INTRAMUSCULAR | Status: DC | PRN

## 2020-12-17 MED ORDER — SODIUM CHLORIDE 0.9 % IV SOLN: INTRAVENOUS | Status: DC

## 2020-12-17 MED ORDER — VERAPAMIL HCL 2.5 MG/ML IV SOLN
INTRAVENOUS | Status: DC | PRN
  Administered 2020-12-17: 2.5 mg via INTRAVENOUS

## 2020-12-17 MED ORDER — FENTANYL CITRATE (PF) 100 MCG/2ML IJ SOLN
INTRAMUSCULAR | Status: DC | PRN
  Administered 2020-12-17: 25 ug via INTRAVENOUS

## 2020-12-17 SURGICAL SUPPLY — 11 items
CATH BALLN WEDGE 5F 110CM (CATHETERS) ×1 IMPLANT
CATH INFINITI 5 FR JL3.5 (CATHETERS) ×1 IMPLANT
CATH INFINITI JR4 5F (CATHETERS) ×1 IMPLANT
DEVICE RAD TR BAND REGULAR (VASCULAR PRODUCTS) ×1 IMPLANT
GLIDESHEATH SLEND SS 6F .021 (SHEATH) ×1 IMPLANT
GUIDEWIRE .025 260CM (WIRE) ×1 IMPLANT
GUIDEWIRE INQWIRE 1.5J.035X260 (WIRE) IMPLANT
INQWIRE 1.5J .035X260CM (WIRE) ×2
KIT MANI 3VAL PERCEP (MISCELLANEOUS) ×2 IMPLANT
PACK CARDIAC CATH (CUSTOM PROCEDURE TRAY) ×2 IMPLANT
SHEATH GLIDE SLENDER 4/5FR (SHEATH) ×1 IMPLANT

## 2020-12-17 NOTE — Interval H&P Note (Signed)
History and Physical Interval Note:  12/17/2020 10:43 AM  Caleb Nevin Ohlsen Sr.  has presented today for surgery, with the diagnosis of RT and LT Cath   Acute systolic HF.  The various methods of treatment have been discussed with the patient and family. After consideration of risks, benefits and other options for treatment, the patient has consented to  Procedure(s): RIGHT/LEFT HEART CATH AND CORONARY ANGIOGRAPHY (Bilateral) as a surgical intervention.  The patient's history has been reviewed, patient examined, no change in status, stable for surgery.  I have reviewed the patient's chart and labs.  Questions were answered to the patient's satisfaction.     [name]

## 2020-12-17 NOTE — Telephone Encounter (Signed)
Pt wife [name] called, returned called ,line is busy

## 2020-12-17 NOTE — Telephone Encounter (Signed)
He came in on oxygen so he should be on oxygen at all times.  Okay to place the order.

## 2020-12-17 NOTE — Telephone Encounter (Signed)
Order has been placed to [facility] for 2L.  Caleb Bryant is aware and voiced her understanding.  Nothing further needed.

## 2020-12-21 ENCOUNTER — Ambulatory Visit (HOSPITAL_COMMUNITY)
Admit: 2020-12-21 | Discharge: 2020-12-21 | Disposition: A | Discharge status: Home / Self Care | Payer: PPO | Source: Ambulatory Visit | Attending: Cardiology | Admitting: Cardiology

## 2020-12-21 ENCOUNTER — Encounter (HOSPITAL_COMMUNITY): Payer: Self-pay | Admitting: Cardiology

## 2020-12-21 ENCOUNTER — Other Ambulatory Visit: Payer: Self-pay

## 2020-12-21 ENCOUNTER — Encounter: Payer: Self-pay | Admitting: Cardiology

## 2020-12-21 ENCOUNTER — Encounter (INDEPENDENT_AMBULATORY_CARE_PROVIDER_SITE_OTHER): Payer: PPO | Admitting: Cardiology

## 2020-12-21 VITALS — BP 140/70 | HR 77 | Wt 178.2 lb

## 2020-12-21 DIAGNOSIS — Z87891 Personal history of nicotine dependence: Secondary | ICD-10-CM | POA: Insufficient documentation

## 2020-12-21 DIAGNOSIS — I2721 Secondary pulmonary arterial hypertension: Secondary | ICD-10-CM | POA: Diagnosis not present

## 2020-12-21 DIAGNOSIS — I5082 Biventricular heart failure: Secondary | ICD-10-CM | POA: Diagnosis not present

## 2020-12-21 DIAGNOSIS — Z7982 Long term (current) use of aspirin: Secondary | ICD-10-CM | POA: Diagnosis not present

## 2020-12-21 DIAGNOSIS — I10 Essential (primary) hypertension: Secondary | ICD-10-CM

## 2020-12-21 DIAGNOSIS — R0602 Shortness of breath: Secondary | ICD-10-CM | POA: Insufficient documentation

## 2020-12-21 DIAGNOSIS — I2510 Atherosclerotic heart disease of native coronary artery without angina pectoris: Secondary | ICD-10-CM | POA: Insufficient documentation

## 2020-12-21 DIAGNOSIS — J8410 Pulmonary fibrosis, unspecified: Secondary | ICD-10-CM | POA: Diagnosis not present

## 2020-12-21 DIAGNOSIS — Z8249 Family history of ischemic heart disease and other diseases of the circulatory system: Secondary | ICD-10-CM | POA: Diagnosis not present

## 2020-12-21 DIAGNOSIS — Z79899 Other long term (current) drug therapy: Secondary | ICD-10-CM | POA: Diagnosis not present

## 2020-12-21 DIAGNOSIS — I5022 Chronic systolic (congestive) heart failure: Secondary | ICD-10-CM | POA: Diagnosis not present

## 2020-12-21 DIAGNOSIS — G4733 Obstructive sleep apnea (adult) (pediatric): Secondary | ICD-10-CM | POA: Diagnosis not present

## 2020-12-21 DIAGNOSIS — I110 Hypertensive heart disease with heart failure: Secondary | ICD-10-CM | POA: Diagnosis not present

## 2020-12-21 DIAGNOSIS — Z8616 Personal history of COVID-19: Secondary | ICD-10-CM | POA: Diagnosis not present

## 2020-12-21 DIAGNOSIS — I428 Other cardiomyopathies: Secondary | ICD-10-CM | POA: Insufficient documentation

## 2020-12-21 HISTORY — DX: Heart failure, unspecified: I50.9

## 2020-12-21 LAB — BASIC METABOLIC PANEL
Anion gap: 10 (ref 5–15)
BUN: 22 mg/dL (ref 8–23)
CO2: 28 mmol/L (ref 22–32)
Calcium: 8.8 mg/dL — ABNORMAL LOW (ref 8.9–10.3)
Chloride: 103 mmol/L (ref 98–111)
Creatinine, Ser: 1.18 mg/dL (ref 0.61–1.24)
GFR, Estimated: >60 mL/min (ref >60)
Glucose, Bld: 99 mg/dL (ref 70–99)
Potassium: 3.7 mmol/L (ref 3.5–5.1)
Sodium: 141 mmol/L (ref 135–145)

## 2020-12-21 LAB — BRAIN NATRIURETIC PEPTIDE: B Natriuretic Peptide: 1273 pg/mL — ABNORMAL HIGH (ref 0.0–100.0)

## 2020-12-21 MED ORDER — FUROSEMIDE 20 MG PO TABS: 20.0000 mg | ORAL_TABLET | Freq: Every day | ORAL | 11 refills | Status: DC

## 2020-12-21 NOTE — Progress Notes (Signed)
Height: 5'4"    Weight:178.2 lbs BMI:30.6  Today's Date:12/21/2020  STOP BANG RISK ASSESSMENT S (snore) Have you been told that you snore?     YES/   T (tired) Are you often tired, fatigued, or sleepy during the day?   YES/  O (obstruction) Do you stop breathing, choke, or gasp during sleep? /NO   P (pressure) Do you have or are you being treated for high blood pressure? NO   B (BMI) Is your body index greater than 35 kg/m? /NO   A (age) Are you 86 years old or older? YES/  N (neck) Do you have a neck circumference greater than 16 inches?   YES/NO   G (gender) Are you a male? YES/   TOTAL STOP/BANG "YES" ANSWERS 4                                                                       For Office Use Only              Procedure Order Form    YES to 3+ Stop Bang questions OR two clinical symptoms - patient qualifies for WatchPAT (CPT 95800)      Clinical Notes: Will consult Sleep Specialist and refer for management of therapy due to patient increased risk of Sleep Apnea. Ordering a sleep study due to the following two clinical symptoms: Excessive daytime sleepiness G47.10 /  / Nocturia R35.1 / / Difficulty concentrating R41.840 4 / Loud snoring R06.83 / / Unrefreshed by sleep G47.8 / Impotence N52.  R03.0 / Insomnia G47.00

## 2020-12-21 NOTE — Patient Instructions (Signed)
EKG done today.  Labs done today. We will contact you only if your labs are abnormal.  STOP taking Losartan  START taking Lasix 33m (1 tablet) by mouth daily.  START taking Entresto 24-216m(1 tablet) by mouth 2 times daily.  OuEmelletaff will contact you about starting Tyvaso.   No other medication changes were made. Please continue all current medications as prescribed.  Your physician has requested that you have a cardiac MRI. Cardiac MRI uses a computer to create images of your heart as its beating, producing both still and moving pictures of your heart and major blood vessels. For further information please visit [email or web address] follow the instruction sheet given to you today for more information. This has to be approved through your insurance company prior to scheduling, once approved we will contact you to schedule an appointment.  Your provider has recommended that you have a home sleep study.  We have provided you with the equipment in our office today. Please download the app and follow the instructions. YOUR PIN NUMBER IS: [id number]. Once you have completed the test you just dispose of the equipment, the information is automatically uploaded to usKoreaia blue-tooth technology. If your test is positive for sleep apnea and you need a home CPAP machine you will be contacted by Dr TuTheodosia Blenderffice ([facility].   Your physician recommends that you schedule a follow-up appointment in: 10 days for a lab only appointment in [place] in 3 weeks for an appointment with Dr. McAundra Dubin  If you have any questions or concerns before your next appointment please send us[place] message through myNewhallr call our office at [phone]   TO LEAVE A MESSAGE FOR THE NURSE SELECT OPTION 2, PLEASE LEAVE A MESSAGE INCLUDING: . YOUR NAME . DATE OF BIRTH . CALL BACK NUMBER . REASON FOR CALL**this is important as we prioritize the call backs  YOU WILL RECEIVE A  CALL BACK THE SAME DAY AS LONG AS YOU CALL BEFORE 4:00 PM   Do the following things EVERYDAY: 1) Weigh yourself in the morning before breakfast. Write it down and keep it in a log. 2) Take your medicines as prescribed 3) Eat low salt foods--Limit salt (sodium) to 2000 mg per day.  4) Stay as active as you can everyday 5) Limit all fluids for the day to less than 2 liters   At [facility] and your health needs are our priority. As part of our continuing mission to provide you with exceptional heart care, we have created designated Provider Care Teams. These Care Teams include your primary Cardiologist (physician) and Advanced Practice Providers (APPs- Physician Assistants and Nurse Practitioners) who all work together to provide you with the care you need, when you need it.   You may see any of the following providers on your designated Care Team at your next follow up: . Marland Kitchenr DaGlori Bickers Dr DaLoralie Champagne AmDarrick GrinderNP . BrLyda JesterPA . LaAudry RilesPharmD   Please be sure to bring in all your medications bottles to every appointment.

## 2020-12-22 ENCOUNTER — Other Ambulatory Visit (HOSPITAL_COMMUNITY): Payer: Self-pay | Admitting: *Deleted

## 2020-12-22 ENCOUNTER — Telehealth (HOSPITAL_COMMUNITY): Payer: Self-pay | Admitting: Pharmacy Technician

## 2020-12-22 MED ORDER — ENTRESTO 24-26 MG PO TABS: 1.0000 | ORAL_TABLET | Freq: Two times a day (BID) | ORAL | 3 refills | Status: DC

## 2020-12-22 NOTE — Progress Notes (Signed)
PCP: [name], MD Cardiology: Dr. Fletcher Anon HF Cardiology: Dr. Aundra Dubin  87 y.o. with history of atrial flutter, pulmonary fibrosis, and chronic systolic CHF was referred by Dr. Fletcher Anon for evaluation of CHF and pulmonary hypertension.  Patient has had pulmonary fibrosis since 2013, thought to be related to working in a [employer], had exposure to asbestos.  Last CT chest in 12/21 showed progressive fibrosis and honeycombing.  In 1/15, patient was noted to be in atrial flutter.  Echo at the time showed EF 25-30% and cath showed minimal CAD.  He had atria fibrillation ablation in 2/15 and anticoagulation was later stopped.  Echo in 8/16 showed EF back to normal range.  In 10/21, he had COVID-19 PNA.  He feels like he has never recovered from this.  Prior to 10/21, he was short of breath after weed-eating for about 15 minutes.  Now, he cannot walk to the mailbox without getting short of breath.  He has home oxygen but does not use it regularly.  Oxygen saturation today was 77% when he walked in.  We put him on oxygen.  He is short of breath walking around the house, stops 2-3 times walking into church.  No orthopnea/PND.  No orthopnea/PND.  No chest pain.  No lightheadedness or syncope.   Echo was done in 2/22 due to worsening symptoms.  This showed EF 25-30% with moderate RV dysfunction.  RHC/LHC was then done in 2/22, showing mild luminal irregularities, elevated right heart filling pressure and severe pulmonary arterial hypertension with normal PCWP.   ECG (personally reviewed): NSR, RBBB, RVH  Labs (2/22): K 4, creatinine 1.23, hgb 14.2  PMH: 1. Atrial flutter: S/p ablation 2/15.  2. Type 2 diabetes 3. HTN 4. Hyperlipidemia 5. Ascending aortic aneurysm: Small, 41 mm on 2/22 echo.  6. Pulmonary fibrosis: Exposure to environmental toxins including asbestos while working [employer].   Fibrosis was found in 2013.  - CT chest (12/21): Progressive ILD in UIP pattern with honeycombing.  - PFTs (2/22):  FVC 66%, FEV1 77%, ratio 80%, TLC 47% => restrictive pattern.  7. COVID-19 infection 10/21.  8. COPD: Prior smoker.  9. 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   - Echo (8/16) with EF normal range.  - Echo (2/22) with EF 25-30%, global hypokinesis, moderately decreased RV systolic function (no atrial arrhythmia).  - RHC/LHC (2/22): Mild nonobstructive CAD; mean RA 17, PA 70/27 mean 44, mean PCWP 13, CI 2.13, PVR 8.12.  10. Pulmonary hypertension: RHC (2/22) with mean RA 17, PA 70/27 mean 44, mean PCWP 13, CI 2.13, PVR 8.12.  SH: Retired Civil engineer, contracting, married, lives in [place].  Enjoys fishing.  Remote smoker, no ETOH.   Family History  Problem Relation Age of Onset  . Coronary artery disease Sister   . Heart failure Mother    ROS: All systems reviewed and negative except as per HPI.   Current Outpatient Medications  Medication Sig Dispense Refill  . albuterol (VENTOLIN HFA) 108 (90 Base) MCG/ACT inhaler Inhale 2 puffs into the lungs every 4 (four) hours as needed for shortness of breath or wheezing. 18 g 0  . allopurinol (ZYLOPRIM) 100 MG tablet TAKE 1 TABLET(100 MG) BY MOUTH DAILY 90 tablet 3  . ascorbic acid (VITAMIN C) 500 MG tablet Take 1 tablet (500 mg total) by mouth daily. 30 tablet 0  . aspirin 81 MG chewable tablet Chew 81 mg  by mouth daily.    Marland Kitchen atorvastatin (LIPITOR) 20 MG tablet Take one table by mouth daily 90 tablet 3  . Budeson-Glycopyrrol-Formoterol (BREZTRI AEROSPHERE) 160-9-4.8 MCG/ACT AERO Inhale 2 puffs into the lungs in the morning and at bedtime.    . cholecalciferol (VITAMIN D) 25 MCG tablet Take 1 tablet (1,000 Units total) by mouth daily. 30 tablet 0  . Cyanocobalamin (B-12 PO) Take 1,000 mcg by mouth daily.     . furosemide (LASIX) 20 MG tablet Take 1 tablet (20 mg total) by mouth daily. 30 tablet 11  . metoprolol succinate (TOPROL XL) 25 MG 24 hr tablet Take 1 tablet (25 mg  total) by mouth daily. 30 tablet 5  . zinc gluconate 50 MG tablet Take 50 mg by mouth daily.     No current facility-administered medications for this encounter.   BP 140/70   Pulse 77   Wt 80.8 kg (178 lb 3.2 oz)   SpO2 95% Comment: 3 liters n/c  BMI 30.59 kg/m  General: NAD Neck: JVP 8-9 cm with HJR, no thyromegaly or thyroid nodule.  Lungs: Clear to auscultation bilaterally with normal respiratory effort. CV: Nondisplaced PMI.  Heart regular S1/S2, no S3/S4, 1/6 SEM RUSB.  Trace ankle edema.  No carotid bruit.  Normal pedal pulses.  Abdomen: Soft, nontender, no hepatosplenomegaly, mildly distention.  Skin: Intact without lesions or rashes.  Neurologic: Alert and oriented x 3.  Psych: Normal affect. Extremities: No clubbing or cyanosis.  HEENT: Normal.   Assessment/Plan: 1. Chronic systolic CHF: Nonischemic cardiomyopathy, cath in 2/22 with nonobstructive CAD.  In past, he was thought to have tachy-mediated CMP (improved after atrial flutter ablation).  However, he has not had any recent prolonged fibrillation or flutter, so this does not seem to be the culprit for the recurrent drop in EF. Echo in 2/22 showed biventricular failure with LV EF 25-30% and moderate RV dysfunction (RVH on echo).  RHC in 2/22 showed elevated right-sided filling pressure and pulmonary arterial hypertension.  Cannot rule out viral myocarditis as cause of cardiomyopathy (had COVID in 10/21, ?if related).  On exam, he is volume overloaded.  NYHA class III symptoms.  - Start Lasix 20 mg daily, BMET today and in 10 days.  - Start Entresto 24/26 bid and stop losartan.  - Continue Toprol XL 25 mg daily.  - I will arrange for cardiac MRI to assess for infiltrative disease or myocarditis.  - Given age, would not pursue ICD.  Narrow QRS so not CRT candidate.  2. 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 PVR 8.1 on 2/22 cath.  Possible group 1 PH component.  PH is contributing to RV systolic dysfunction.  - I think he would be a good candidate for Tyvaso for PH-ILD.  Will arrange.  - He needs a home sleep study to assess for OSA given daytime fatigue and snoring, will arrange.  3. Pulmonary fibrosis: Restrictive PFTs in 2/22.  CT chest in 12/21 with progressive pulmonary fibrosis and honeycombing.  COVID-19 lung involvement could have worsened pulmonary parenchymal disease.        - He follows with pulmonary, would he be a candidate for antifibrotic therapy?  - I will try to get him a portable oxygen tank (should wear oxygen at all times).   4. Atrial flutter: Noted in 1/15, had ablation in 2/15, no atrial arrhythmias documented since that time.  He is in NSR today.  He is not anticoagulated.  Followup with me in 3 wks.   Loralie Champagne 12/22/2020

## 2020-12-22 NOTE — Procedures (Deleted)
   Sleep Study Report Patient Information First Name: Caleb Last Name: Bryant Gave ID: [id number] Birth Date: 1934-07-02 Age: 87 Gender: Male BMI: Sleep Study Information Study Date:12/21/2020  TEST DESCRIPTION: Home sleep apnea testing was completed using the WatchPat, a Type 1 device, utilizing peripheral arterial tonometry (PAT), chest movement, actigraphy, pulse oximetry, pulse rate, body position and snore. AHI was calculated with apnea and hypopnea using valid sleep time as the denominator. RDI includes apneas, hypopneas, and RERAs. The data acquired and the scoring of sleep and all associated events were performed in accordance with the recommended standards and specifications as outlined in the AASM Manual for the Scoring of Sleep and Associated Events 2.2.0 (2015).  FINDINGS: 1. Moderate Obstructive Sleep Apnea with AHI 22hr. 2. No Central Sleep Apnea with pAHIc 0.5/hr. 3. Oxygen desaturations as low as 55%. 4. Mild snoring was present. O2 sats were < 88% for 4.7 min. 5. Total sleep time was 7hrs and 59 min. 6. 14.7% of total sleep time was spent in REM sleep. 7. Short sleep onset latency at 7 min. 8. Prolonged REM sleep onset latency at 150mn. 9. Total awakenings were 30.  DIAGNOSIS: Moderate Obstructive Sleep Apnea (G47.33)  RECOMMENDATIONS: 1. Clinical correlation of these findings is necessary. The decision to treat obstructive sleep apnea (OSA) is usually based on the presence of apnea symptoms or the presence of associated medical conditions such as Hypertension, Congestive Heart Failure, Atrial Fibrillation or Obesity. The most common symptoms of OSA are snoring, gasping for breath while sleeping, daytime sleepiness and fatigue.  2. Initiating apnea therapy is recommended given the presence of symptoms and/or associated conditions.  Recommend proceeding with one of the following:  a. Auto-CPAP therapy with a pressure range of 5-20cm H2O.   b. An oral appliance  (OA) that can be obtained from certain dentists with expertise in sleep medicine. These are primarily of use in non-obese patients with mild and moderate disease.   c. An ENT consultation which may be useful to look for specific causes of obstruction and possible treatment Options.   d. If patient is intolerant to PAP therapy, consider referral to ENT for evaluation for hypoglossal nerve stimulator.  3. Close follow-up is necessary to ensure success with CPAP or oral appliance therapy for maximum benefit .  4. A follow-up oximetry study on CPAP is recommended to assess the adequacy of therapy and determine the need for supplemental oxygen or the potential need for Bi-level therapy. An arterial blood gas to determine the adequacy of baseline ventilation and oxygenation should also be considered.  5. Healthy sleep recommendations include: adequate nightly sleep (normal 7-9 hrs/night), avoidance of caffeine after noon and alcohol near bedtime, and maintaining a sleep environment that is cool, dark and quiet.  6. Weight loss for overweight patients is recommended. Even modest amounts of weight loss can significantly improve the severity of sleep apnea.  7. Snoring recommendations include: weight loss where appropriate, side sleeping, and avoidance of alcohol before Bed.  8. Operation of motor vehicle or dangerous equipment must be avoided when feeling drowsy, excessively sleepy, or mentally fatigued.  Report prepared by: Signature: TFransico Him MD FSaint [facility] DStevensvilleBoard of Sleep Medicine  Electronically Signed: Dec 22, 2020

## 2020-12-22 NOTE — Telephone Encounter (Signed)
Sent Tyvaso referral via fax.

## 2020-12-23 ENCOUNTER — Telehealth (HOSPITAL_COMMUNITY): Payer: Self-pay | Admitting: Pharmacy Technician

## 2020-12-23 NOTE — Telephone Encounter (Signed)
Patient Advocate Encounter   Received notification from [employer] that prior authorization for Caleb Bryant is required.   PA submitted on CoverMyMeds Key [id number] Status is pending   Will continue to follow.

## 2020-12-23 NOTE — Telephone Encounter (Signed)
Advanced Heart Failure Patient Advocate Encounter  Prior Authorization for Delene Loll has been approved.    Effective dates: 12/23/20 through 12/23/21  Patients co-pay is $45 (30 days)  Charlann Boxer, CPhT

## 2020-12-26 ENCOUNTER — Ambulatory Visit: Payer: PPO

## 2020-12-26 DIAGNOSIS — I10 Essential (primary) hypertension: Secondary | ICD-10-CM

## 2020-12-31 MED ORDER — TYVASO 0.6 MG/ML IN SOLN: 18.0000 ug | Freq: Four times a day (QID) | RESPIRATORY_TRACT | Status: DC

## 2020-12-31 NOTE — Addendum Note (Signed)
Encounter addended by: Scarlette Calico, RN on: 12/31/2020 4:05 PM  Actions taken: Order list changed

## 2020-12-31 NOTE — Telephone Encounter (Addendum)
Advanced Heart Failure Patient Advocate Encounter  Prior Authorization for Tyvaso has been approved.    PA# [id number] Effective dates: 01/01/21 through 04/01/21  Sent approval to [facility], where the patient will get the medication shipped from.  Charlann Boxer, CPhT

## 2021-01-04 ENCOUNTER — Telehealth: Payer: Self-pay | Admitting: *Deleted

## 2021-01-04 NOTE — Telephone Encounter (Deleted)
-----  Message from Sueanne Margarita, MD sent at 12/22/2020  5:44 PM EST ----- Please let patient know that they have sleep apnea.  Order ResMed auto CPAP from 4 to 18cm H2O with heated humidity and mask of choice. Followup with me in 6 weeks.

## 2021-01-04 NOTE — Progress Notes (Signed)
  This encounter was created in error - please disregard.

## 2021-01-04 NOTE — Telephone Encounter (Signed)
-----  Message from Traci R Turner, MD sent at 12/22/2020  5:44 PM EST ----- Please let patient know that they have sleep apnea.  Order ResMed auto CPAP from 4 to 18cm H2O with heated humidity and mask of choice. Followup with me in 6 weeks.   

## 2021-01-04 NOTE — Procedures (Signed)
   Sleep Study Report  Patient Information First Name: Caleb Last Name: Bryant  ID: [id number] Birth Date: Dec 16, 1933  Age: 87  Gender: Male Referring MD:  Dalton McLean, MD Study Date:12/21/2020  TEST DESCRIPTION: Home sleep apnea testing was completed using the WatchPat, a Type 1 device, utilizing peripheral arterial tonometry (PAT), chest movement, actigraphy, pulse oximetry, pulse rate, body position and snore. AHI was calculated with apnea and hypopnea using valid sleep time as the denominator. RDI includes apneas, hypopneas, and RERAs. The data acquired and the scoring of sleep and all associated events were performed in accordance with the recommended standards and specifications as outlined in the AASM Manual for the Scoring of Sleep and Associated Events 2.2.0 (2015).  FINDINGS: 1. Moderate Obstructive Sleep Apnea with AHI 22hr. 2. No Central Sleep Apnea with pAHIc 0.5/hr. 3. Oxygen desaturations as low as 55%. 4. Mild snoring was present. O2 sats were < 88% for 4.7 min. 5. Total sleep time was 7hrs and 59 min. 6. 14.7% of total sleep time was spent in REM sleep. 7. Short sleep onset latency at 7 min. 8. Prolonged REM sleep onset latency at 118min. 9. Total awakenings were 30.  DIAGNOSIS: Moderate Obstructive Sleep Apnea (G47.33)  RECOMMENDATIONS: 1. Clinical correlation of these findings is necessary. The decision to treat obstructive sleep apnea (OSA) is usually based on the presence of apnea symptoms or the presence of associated medical conditions such as Hypertension, Congestive Heart Failure, Atrial Fibrillation or Obesity. The most common symptoms of OSA are snoring, gasping for breath while sleeping, daytime sleepiness and fatigue.  2. Initiating apnea therapy is recommended given the presence of symptoms and/or associated conditions.  Recommend proceeding with one of the following:  a. Auto-CPAP therapy with a pressure range of 5-20cm H2O.   b. An oral  appliance (OA) that can be obtained from certain dentists with expertise in sleep medicine. These are primarily of use in non-obese patients with mild and moderate disease.   c. An ENT consultation which may be useful to look for specific causes of obstruction and possible treatment Options.   d. If patient is intolerant to PAP therapy, consider referral to ENT for evaluation for hypoglossal nerve stimulator.  3. Close follow-up is necessary to ensure success with CPAP or oral appliance therapy for maximum benefit .  4. A follow-up oximetry study on CPAP is recommended to assess the adequacy of therapy and determine the need for supplemental oxygen or the potential need for Bi-level therapy. An arterial blood gas to determine the adequacy of baseline ventilation and oxygenation should also be considered.  5. Healthy sleep recommendations include: adequate nightly sleep (normal 7-9 hrs/night), avoidance of caffeine after noon and alcohol near bedtime, and maintaining a sleep environment that is cool, dark and quiet.  6. Weight loss for overweight patients is recommended. Even modest amounts of weight loss can significantly improve the severity of sleep apnea.  7. Snoring recommendations include: weight loss where appropriate, side sleeping, and avoidance of alcohol before Bed.  8. Operation of motor vehicle or dangerous equipment must be avoided when feeling drowsy, excessively sleepy, or mentally fatigued.  Report prepared by: Signature: Thena Devora, MD FACC, Diplomate American Board of Sleep Medicine  Electronically Signed: Dec 22, 2020  

## 2021-01-04 NOTE — Telephone Encounter (Signed)
Error, disregard.

## 2021-01-04 NOTE — Addendum Note (Signed)
Addended by: Fransico Him R on: 01/04/2021 01:54 PM   Modules accepted: Level of Service, SmartSet

## 2021-01-04 NOTE — Progress Notes (Signed)
Sleep Study Report  Patient Information First Name: Caleb Last Name: Bryant  ID: [id number] Birth Date: October 19, 1934  Age: 87  Gender: Male Referring MD:  Loralie Champagne, MD Study Date:12/21/2020  TEST DESCRIPTION: Home sleep apnea testing was completed using the WatchPat, a Type 1 device, utilizing peripheral arterial tonometry (PAT), chest movement, actigraphy, pulse oximetry, pulse rate, body position and snore. AHI was calculated with apnea and hypopnea using valid sleep time as the denominator. RDI includes apneas, hypopneas, and RERAs. The data acquired and the scoring of sleep and all associated events were performed in accordance with the recommended standards and specifications as outlined in the AASM Manual for the Scoring of Sleep and Associated Events 2.2.0 (2015).  FINDINGS: 1. Moderate Obstructive Sleep Apnea with AHI 22hr. 2. No Central Sleep Apnea with pAHIc 0.5/hr. 3. Oxygen desaturations as low as 55%. 4. Mild snoring was present. O2 sats were < 88% for 4.7 min. 5. Total sleep time was 7hrs and 59 min. 6. 14.7% of total sleep time was spent in REM sleep. 7. Short sleep onset latency at 7 min. 8. Prolonged REM sleep onset latency at 170mn. 9. Total awakenings were 30.  DIAGNOSIS: Moderate Obstructive Sleep Apnea (G47.33)  RECOMMENDATIONS: 1. Clinical correlation of these findings is necessary. The decision to treat obstructive sleep apnea (OSA) is usually based on the presence of apnea symptoms or the presence of associated medical conditions such as Hypertension, Congestive Heart Failure, Atrial Fibrillation or Obesity. The most common symptoms of OSA are snoring, gasping for breath while sleeping, daytime sleepiness and fatigue.  2. Initiating apnea therapy is recommended given the presence of symptoms and/or associated conditions.  Recommend proceeding with one of the following:  a. Auto-CPAP therapy with a pressure range of 5-20cm H2O.   b. An oral  appliance (OA) that can be obtained from certain dentists with expertise in sleep medicine. These are primarily of use in non-obese patients with mild and moderate disease.   c. An ENT consultation which may be useful to look for specific causes of obstruction and possible treatment Options.   d. If patient is intolerant to PAP therapy, consider referral to ENT for evaluation for hypoglossal nerve stimulator.  3. Close follow-up is necessary to ensure success with CPAP or oral appliance therapy for maximum benefit .  4. A follow-up oximetry study on CPAP is recommended to assess the adequacy of therapy and determine the need for supplemental oxygen or the potential need for Bi-level therapy. An arterial blood gas to determine the adequacy of baseline ventilation and oxygenation should also be considered.  5. Healthy sleep recommendations include: adequate nightly sleep (normal 7-9 hrs/night), avoidance of caffeine after noon and alcohol near bedtime, and maintaining a sleep environment that is cool, dark and quiet.  6. Weight loss for overweight patients is recommended. Even modest amounts of weight loss can significantly improve the severity of sleep apnea.  7. Snoring recommendations include: weight loss where appropriate, side sleeping, and avoidance of alcohol before Bed.  8. Operation of motor vehicle or dangerous equipment must be avoided when feeling drowsy, excessively sleepy, or mentally fatigued.  Report prepared by: Signature: TFransico Him MD FGood [facility]ar[facility] DGarcenoBoard of Sleep Medicine  Electronically Signed: Dec 22, 2020

## 2021-01-04 NOTE — Telephone Encounter (Addendum)
Informed patient of sleep study results and patient understanding was verbalized. Patient understands her sleep study showed they have sleep apnea. Order ResMed auto CPAP from 4 to 15cm H2O with heated humidity and mask of choice. Followup with me in 6 weeks.   Patient/Wife wants to wait until patients appointment will his provider to discuss his results before ordering his unit.  Upon patient request DME selection is [facility]. Patient understands she/he will be contacted by [facility] to set up her/he cpap. Patient understands to call if [facility]   does not contact her/he with new setup in a timely manner. Patient understands they will be called once confirmation has been received from [facility] that they have received their new machine to schedule 10 week follow up appointment.   [facility] notified of new cpap order  Please add to airview Patient was grateful for the call and thanked me.

## 2021-01-05 ENCOUNTER — Telehealth: Payer: Self-pay | Admitting: Family Medicine

## 2021-01-05 NOTE — Telephone Encounter (Signed)
LVM for pt to rtn my call to schedule AWV with [facility].

## 2021-01-06 ENCOUNTER — Other Ambulatory Visit (HOSPITAL_COMMUNITY): Payer: Self-pay | Admitting: *Deleted

## 2021-01-06 ENCOUNTER — Encounter: Payer: Self-pay | Admitting: Cardiovascular Disease

## 2021-01-06 ENCOUNTER — Other Ambulatory Visit: Payer: Self-pay

## 2021-01-06 ENCOUNTER — Ambulatory Visit: Payer: PPO | Admitting: Cardiovascular Disease

## 2021-01-06 VITALS — BP 94/64 | HR 64 | Ht 64.0 in | Wt 172.0 lb

## 2021-01-06 DIAGNOSIS — I10 Essential (primary) hypertension: Secondary | ICD-10-CM

## 2021-01-06 DIAGNOSIS — I712 Thoracic aortic aneurysm, without rupture: Secondary | ICD-10-CM

## 2021-01-06 DIAGNOSIS — I5022 Chronic systolic (congestive) heart failure: Secondary | ICD-10-CM | POA: Diagnosis not present

## 2021-01-06 DIAGNOSIS — I7121 Aneurysm of the ascending aorta, without rupture: Secondary | ICD-10-CM

## 2021-01-06 DIAGNOSIS — J8410 Pulmonary fibrosis, unspecified: Secondary | ICD-10-CM | POA: Diagnosis not present

## 2021-01-06 DIAGNOSIS — Z8679 Personal history of other diseases of the circulatory system: Secondary | ICD-10-CM | POA: Diagnosis not present

## 2021-01-06 NOTE — Progress Notes (Signed)
Cardiology Office Note   Date:  01/06/2021   ID:  Caleb Nevin Kaneko Sr., DOB 1934/04/03, MRN [id number]  PCP:  [name], MD  Cardiologist:   [name], MD   Chief Complaint  Patient presents with  . Follow-up    4 weeks      History of Present Illness: Caleb Bennett Sr. is a 87 y.o. male who is here today for follow-up visit regarding atrial arrhythmia and chronic systolic heart failure.  He has known history of atrial flutter status post ablation.   He has known history of type 2 diabetes not on medications, previous tobacco use, hypertension and hyperlipidemia.  He was diagnosed with atrial flutter in January 2015 with cardiomyopathy and ejection fraction of 25 to 30%.  He underwent cardiac catheterization which showed minor irregularities with no obstructive disease. He underwent catheter ablation by Dr. Lovena Le in February 3568 with no complications.  Ejection fraction improved gradually with echocardiogram in August 2016 showing normal LV systolic function. He is also known to have small ascending aortic aneurysm at 4.1 cm.  He has history of pulmonary fibrosis.  He used to work in a [employer].   He was hospitalized in October with COVID-19 infection and received remdesivir.  He has been following with [name] and pulmonary.  He had an echocardiogram done recently which showed an EF of 25 to 30% with global hypokinesis, mild aortic stenosis and mildly dilated ascending aorta.  He is currently on 2 L of oxygen and sometimes has to increase to 3 L.    I proceeded with a right and left cardiac catheterization last month which showed mild nonobstructive coronary artery disease.  Right heart catheterization showed normal filling pressures with severe pulmonary hypertension and mildly reduced cardiac output.  His pulmonary vascular resistance was 8.12 Woods unit.  I referred him to Dr. Aundra Dubin who started him on small dose furosemide and switch losartan to  [facility].  A cardiac MRI was requested.  He is in the process of starting treatment for pulmonary hypertension.  He reports significant shortness of breath but no chest pain.  He has no energy to do anything.  Past Medical History:  Diagnosis Date  . Anxiety   . Atrial flutter ([place])    "just dx'd today" (11/04/2013)  . Benign prostatic hypertrophy   . Borderline diabetes   . CHF (congestive heart failure) ([place])   . Colon, diverticulosis   . COPD (chronic obstructive pulmonary disease) ([place])   . Dysrhythmia   . Exertional shortness of breath   . Gout   . Hyperlipidemia   . Hypertension   . Osteoarthritis    "all over" (11/04/2013)  . PVD (peripheral vascular disease) ([place])     Past Surgical History:  Procedure Laterality Date  . albation  11/04/13  . ANKLE FRACTURE SURGERY Right   . APPENDECTOMY  1997   "days before 1st colon OR"  . ATRIAL FLUTTER ABLATION N/A 11/26/2013   Procedure: ATRIAL FLUTTER ABLATION;  Surgeon: Evans Lance, MD;  Location: [facility] C[facility] LAB;  Service: Cardiovascular;  Laterality: N/A;  . CATARACT EXTRACTION, BILATERAL Bilateral   . CHOLECYSTECTOMY  12/03  . COLON SURGERY     "diverticulitis"  . COLONOSCOPY WITH ESOPHAGOGASTRODUODENOSCOPY (EGD)  12/00   gastic polyp   . COLONOSCOPY WITH ESOPHAGOGASTRODUODENOSCOPY (EGD)  11/07,12/2011   gastritis, 2 gastic polyps  . COLOSTOMY  1997  . COLOSTOMY REVERSAL     "think I wore it 4  months" (11/04/2013)  . CORNEAL TRANSPLANT Right   . CORONARY ANGIOGRAM  11/05/2013   Procedure: CORONARY ANGIOGRAM;  Surgeon: Wellington Hampshire, MD;  Location: [place] CATH LAB;  Service: Cardiovascular;;  . ESOPHAGOGASTRODUODENOSCOPY (EGD) WITH PROPOFOL N/A 08/13/2019   Procedure: ESOPHAGOGASTRODUODENOSCOPY (EGD) WITH PROPOFOL;  Surgeon: Robert Bellow, MD;  Location: [facility] ENDOSCOPY;  Service: Endoscopy;  Laterality: N/A;  . HERNIA REPAIR  11/05/14   ventral hernia  . INGUINAL HERNIA REPAIR Left   . PARTIAL COLECTOMY  1997    "diverticulitis; busted; [place]"  . RIGHT/LEFT HEART CATH AND CORONARY ANGIOGRAPHY Bilateral 12/17/2020   Procedure: RIGHT/LEFT HEART CATH AND CORONARY ANGIOGRAPHY;  Surgeon: Wellington Hampshire, MD;  Location: [place] CV LAB;  Service: Cardiovascular;  Laterality: Bilateral;  . stress cardiolite negative  9/02   EF 60%  . TONSILLECTOMY AND ADENOIDECTOMY     child  . VENTRAL HERNIA REPAIR  11/07   [place]     Current Outpatient Medications  Medication Sig Dispense Refill  . albuterol (VENTOLIN HFA) 108 (90 Base) MCG/ACT inhaler Inhale 2 puffs into the lungs every 4 (four) hours as needed for shortness of breath or wheezing. 18 g 0  . allopurinol (ZYLOPRIM) 100 MG tablet TAKE 1 TABLET(100 MG) BY MOUTH DAILY 90 tablet 3  . aspirin 81 MG chewable tablet Chew 81 mg by mouth daily.    Marland Kitchen atorvastatin (LIPITOR) 20 MG tablet Take one table by mouth daily 90 tablet 3  . Budeson-Glycopyrrol-Formoterol (BREZTRI AEROSPHERE) 160-9-4.8 MCG/ACT AERO Inhale 2 puffs into the lungs in the morning and at bedtime.    . Cyanocobalamin (B-12 PO) Take 1,000 mcg by mouth daily.     . furosemide (LASIX) 20 MG tablet Take 1 tablet (20 mg total) by mouth daily. 30 tablet 11  . metoprolol succinate (TOPROL XL) 25 MG 24 hr tablet Take 1 tablet (25 mg total) by mouth daily. 30 tablet 5  . sacubitril-valsartan (ENTRESTO) 24-26 MG Take 1 tablet by mouth 2 (two) times daily. 60 tablet 3  . Treprostinil (TYVASO) 0.6 MG/ML SOLN Inhale 18 mcg into the lungs in the morning, at noon, in the evening, and at bedtime.     No current facility-administered medications for this visit.    Allergies:   Codeine    Social History:  The patient  reports that he quit smoking about 42 years ago. His smoking use included cigarettes. He has a 90.00 pack-year smoking history. He has quit using smokeless tobacco.  His smokeless tobacco use included chew. He reports that he does not drink alcohol and does not use drugs.   Family  History:  The patient's family history includes Coronary artery disease in his sister; Heart failure in his mother.    ROS:  Please see the history of present illness.   Otherwise, review of systems are positive for none.   All other systems are reviewed and negative.    PHYSICAL EXAM: VS:  BP 94/64   Pulse 64   Ht _0  (1.626 m)   Wt 172 lb (78 kg)   BMI 29.52 kg/m  , BMI Body mass index is 29.52 kg/m. GEN: Well nourished, well developed, in no acute distress  HEENT: normal  Neck: no JVD, carotid bruits, or masses Cardiac: RRR; no  rubs, or gallops,no edema . 1/6 systolic ejection murmur in the aortic area Respiratory: Bilateral dry crackles throughout lung fields consistent with pulmonary fibrosis. GI: soft, nontender, nondistended, + BS MS: no deformity or atrophy  Skin: warm and dry, no rash Neuro:  Strength and sensation are intact Psych: euthymic mood, full affect Right radial pulses normal with no hematoma.   EKG:  EKG is  Not ordered today.    Recent Labs: 08/02/2020: Magnesium 2.1 10/05/2020: ALT 19; TSH 1.56 12/09/2020: Hemoglobin 14.2; Platelets 272 12/21/2020: B Natriuretic Peptide 1,273.0; BUN 22; Creatinine, Ser 1.18; Potassium 3.7; Sodium 141    Lipid Panel    Component Value Date/Time   CHOL 119 12/05/2019 1006   TRIG 55.0 12/05/2019 1006   HDL 60.60 12/05/2019 1006   CHOLHDL 2 12/05/2019 1006   VLDL 11.0 12/05/2019 1006   LDLCALC 48 12/05/2019 1006      Wt Readings from Last 3 Encounters:  01/06/21 172 lb (78 kg)  12/21/20 178 lb 3.2 oz (80.8 kg)  12/17/20 163 lb (73.9 kg)       PAD Screen 02/05/2020  Previous PAD dx? No  Previous surgical procedure? No  Pain with walking? No  Feet/toe relief with dangling? No  Painful, non-healing ulcers? No  Extremities discolored? No      ASSESSMENT AND PLAN:  1.  Chronic systolic heart failure with severely reduced LV systolic function: Recent cardiac catheterization showed no obstructive coronary  artery disease.  He was found to have severe pulmonary hypertension.  He is now managed by Dr. Aundra Dubin.     2. History of atrial flutter status post successful ablation: No evidence of recurrent arrhythmia.  3.  Small ascending aortic aneurysm: No need for surveillance as I do not think he will be a candidate for surgery in the future.  4.   Essential hypertension: Blood pressure is on the low side after recent switching to [facility] but he denies dizziness.  6.  Pulmonary fibrosis: Followed by [name] and pulmonary.   Disposition:    Continue close follow-up with Dr. Aundra Dubin and [name].  Follow-up with me in 6 months.  Signed,  [name], MD  01/06/2021 9:32 AM    [facility]

## 2021-01-06 NOTE — Patient Instructions (Signed)
Medication Instructions:  Your physician recommends that you continue on your current medications as directed. Please refer to the Current Medication list given to you today.  *If you need a refill on your cardiac medications before your next appointment, please call your pharmacy*   Lab Work: None ordered If you have labs (blood work) drawn today and your tests are completely normal, you will receive your results only by: Marland Kitchen MyChart Message (if you have MyChart) OR . A paper copy in the mail If you have any lab test that is abnormal or we need to change your treatment, we will call you to review the results.   Testing/Procedures: None ordered   Follow-Up: At [facility], you and your health needs are our priority.  As part of our continuing mission to provide you with exceptional heart care, we have created designated Provider Care Teams.  These Care Teams include your primary Cardiologist (physician) and Advanced Practice Providers (APPs -  Physician Assistants and Nurse Practitioners) who all work together to provide you with the care you need, when you need it.  We recommend signing up for the patient portal called "MyChart".  Sign up information is provided on this After Visit Summary.  MyChart is used to connect with patients for Virtual Visits (Telemedicine).  Patients are able to view lab/test results, encounter notes, upcoming appointments, etc.  Non-urgent messages can be sent to your provider as well.   To learn more about what you can do with MyChart, go to [email or web address].    Your next appointment:   Your physician wants you to follow-up in: 6 months You will receive a reminder letter in the mail two months in advance. If you don't receive a letter, please call our office to schedule the follow-up appointment.   The format for your next appointment:   In Person  Provider:   You may see [name], MD or one of the following Advanced Practice Providers on  your designated Care Team:    Murray Hodgkins, NP  Christell Faith, PA-C  Marrianne Mood, PA-C  Cadence Hanaford, [place]  Laurann Montana, NP    Other Instructions N/A

## 2021-01-10 ENCOUNTER — Other Ambulatory Visit: Payer: Self-pay | Admitting: Family Medicine

## 2021-01-10 NOTE — Telephone Encounter (Signed)
Spoke with Patient wife [name] scheduled CPE

## 2021-01-10 NOTE — Telephone Encounter (Signed)
Please schedule CPE with Dr. Diona Browner sometime after his [place] on 02/25/21.

## 2021-01-11 ENCOUNTER — Telehealth (HOSPITAL_COMMUNITY): Payer: Self-pay | Admitting: Pharmacy Technician

## 2021-01-11 NOTE — Telephone Encounter (Signed)
After the patient's pharmacy PA for Tyvaso was approved, insurance requires that this medication be billed through the patient's medical benefit.  Called [facility] today to check the status, they are trying to get in touch with the payer to confirm approval status. We should get a determination on the medical PA sometime this week. The co-insurance co-pay would be about $3000. The company is already aware that the patient is interested in assistance. The company should be reaching out to the patient when there is a determination on the patient's PA.   Called and updated the patient's wife with the information above.

## 2021-01-13 ENCOUNTER — Ambulatory Visit: Payer: PPO | Admitting: Pulmonary Disease

## 2021-01-13 ENCOUNTER — Encounter: Payer: Self-pay | Admitting: Pulmonary Disease

## 2021-01-13 ENCOUNTER — Other Ambulatory Visit: Payer: Self-pay

## 2021-01-13 VITALS — BP 100/68 | HR 62 | Temp 97.0°F | Ht 64.0 in | Wt 170.4 lb

## 2021-01-13 DIAGNOSIS — J449 Chronic obstructive pulmonary disease, unspecified: Secondary | ICD-10-CM | POA: Diagnosis not present

## 2021-01-13 DIAGNOSIS — I5022 Chronic systolic (congestive) heart failure: Secondary | ICD-10-CM | POA: Diagnosis not present

## 2021-01-13 DIAGNOSIS — J8410 Pulmonary fibrosis, unspecified: Secondary | ICD-10-CM | POA: Diagnosis not present

## 2021-01-13 DIAGNOSIS — J9611 Chronic respiratory failure with hypoxia: Secondary | ICD-10-CM

## 2021-01-13 DIAGNOSIS — I272 Pulmonary hypertension, unspecified: Secondary | ICD-10-CM | POA: Diagnosis not present

## 2021-01-13 MED ORDER — BREZTRI AEROSPHERE 160-9-4.8 MCG/ACT IN AERO: 2.0000 | INHALATION_SPRAY | Freq: Two times a day (BID) | RESPIRATORY_TRACT | 0 refills | Status: AC

## 2021-01-13 NOTE — Patient Instructions (Signed)
I am recommending a supplement called and N-acetylcysteine (NAC) 600 mg twice a day with food.  You can get this at health food stores such as [employer] or online at [place] or similar.  They usually only use the letters NAC to describe it.  Continue using your Breztri 2 puffs twice a day   We will see you in follow-up in 3 months time call sooner should any new problems arise.

## 2021-01-13 NOTE — Progress Notes (Signed)
Subjective:    Patient ID: Caleb Morgenthaler Sr., male    DOB: November 03, 1933, 87 y.o.   MRN: [id number]  HPI  Patient is a very complex 87 year old former smoker (quit 1980) who presents for follow-up on the issue of shortness of breath and chronic respiratory failure with hypoxia.  He has interstitial lung disease/pulmonary fibrosis with concomitant COPD as well.  He has had issues with chronic cough worse since his COVID-19 diagnosis in October 2021.  He has noted however some improvement of his cough with Breztri inhaler which was provided for him during his initial visit here.  He had a 2 D echo on 30 November 2020 which revealed an EF of 25 to 30%, global hypokinesis, DD grade I, mild to moderate tricuspid valve regurgitation and evidence suggestive of pulmonary hypertension.  He was referred back to cardiology (Dr. Fletcher Anon) for potential right heart cath.  He underwent right heart cath which showed severe pulmonary hypertension with mildly reduced cardiac output.  Numbers are as below.  Since then he has been seen at the advanced heart failure clinic at [facility] in [place].  He was placed on Tyvaso however, assistant for this is pending and the patient has not started.  He was also started on Entresto and Lasix.   DATA: 01/25/2016 spirometry: FEV1 1.7 L or 62% predicted, FVC 2.4 L or 64% predicted.  FEV1/FVC 73%.  FEF 25-75% 1.4 L or 56% predicted.  Suspect combined restrictive/obstructive physiology. 01/28/2016 CT chest: Nonspecific interstitial lung disease present with peripheral distribution. 10/13/2020 CT chest: Marked interval progression of underlying interstitial lung disease demonstrating peripheral and basilar predominant architectural distortion, honeycombing and subpleural reticulation.  Basilar groundglass pulmonary infiltrates, bullous emphysema, findings consistent with UIP plus emphysema 11/16/2020 CTD work-up: RF 14.7,  ANA negative, RP 0.6, ESR 3 11/30/2020 2D echo: LVEF  25 to 30%, global hypokinesis of the LV, DD grade I, RV systolic function moderately reduced, right ventricular size moderately enlarged, dilated pulmonary artery 12/03/2020 PFTs: FEV1 1.62 L or 77% predicted, FVC 2.02 L or 66% predicted, FEV1/FVC 80%, no bronchodilator response, lung volumes moderately to severely reduced, FEF 25-75 % 64%.  Diffusion capacity moderately to severely reduced at 55% 12/17/2020 right and left heart cath: ost RCA to proximal axillary lesion is 20% stenosed, proximal RCA lesion is 30% stenosis, mid RCA lesion 20% stenosed. RA: 15/22 with a mean of 17 mmHg RV: 70 over 9 mmHg PCW: 13 mmHg PA: 70/27 with a mean of 44 mmHg LVEDP: 13 mmHg. PA sat was 65.7% and aortic sat was 96.5%. Cardiac output was 3.82 with a cardiac index of 2.13.  Pulmonary vascular resistance was 8.12 Woods units 12/21/2020 BNP: 1,273  Review of Systems A 10 point review of systems was performed and it is as noted above otherwise negative.  Patient Active Problem List   Diagnosis Date Noted  . Acute systolic heart failure ([place])   . Hypoxia 08/20/2020  . CKD (chronic kidney disease), stage II   . Acute respiratory failure with hypoxia ([place])   . Stage 3a chronic kidney disease ([place])   . Hypokalemia   . Type 2 diabetes mellitus with hyperlipidemia ([place])   . Pneumonia due to COVID-19 virus 07/30/2020  . GERD (gastroesophageal reflux disease) 10/17/2016  . CKD stage 3 due to type 2 diabetes mellitus ([place]) 10/04/2016  . Idiopathic pulmonary fibrosis ([place]) 02/25/2016  . Thoracic aortic aneurysm without rupture ([place]) 02/01/2016  . Chronic cough 01/25/2016  . Chronic obstructive pulmonary disease (HCC)  01/25/2016  . Midline thoracic back pain 10/19/2015  . Advanced directives, counseling/discussion 10/14/2015  . History of atrial flutter 12/21/2014  . Ventral hernia without obstruction or gangrene 10/02/2014  . Chronic systolic CHF (congestive heart failure) ([place]) 12/23/2013  . Intermediate  coronary syndrome ([place]) 11/04/2013  . 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  . DDD (degenerative disc disease), cervical 03/31/2013  . Fuchs' corneal dystrophy 02/19/2013  . Neuropathy due to secondary diabetes mellitus ([place]) 12/31/2012  . 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 DM (diabetes mellitus) with peripheral vascular complication ([place]) 07/68/0881  . Shoulder pain, bilateral 11/06/2011  . Vitamin B12 deficiency 03/11/2009  . INTERMITTENT VERTIGO 03/11/2009  . Mild dementia ([place]) 09/01/2008  . HYPERCHOLESTEROLEMIA 11/29/2007  . ALLERGIC RHINITIS 11/29/2007  . Essential hypertension 11/14/2007  . OSTEOARTHRITIS 06/13/2007  . Peripheral vascular disease due to secondary diabetes mellitus ([place]) 06/12/2007  . DIVERTICULOSIS, COLON 06/12/2007  . BENIGN PROSTATIC HYPERTROPHY 06/12/2007   Social History   Tobacco Use  . Smoking status: Former Smoker    Packs/day: 3.00    Years: 30.00    Pack years: 90.00    Types: Cigarettes    Quit date: 1980    Years since quitting: 42.2  . Smokeless tobacco: Former Systems developer    Types: Chew  . Tobacco comment: 11/04/2013 "quit smoking in the 1980's or so; quit chewing before I quit smoking"  Substance Use Topics  . Alcohol use: No    Comment: 11/04/2013 "quit driking in 1980's; was called a weekend alcoholic"   Allergies  Allergen Reactions  . Codeine Nausea And Vomiting   Current Meds  Medication Sig  . albuterol (VENTOLIN HFA) 108 (90 Base) MCG/ACT inhaler Inhale 2 puffs into the lungs every 4 (four) hours as needed for shortness of breath or wheezing.  Marland Kitchen allopurinol (ZYLOPRIM) 100 MG tablet TAKE 1 TABLET(100 MG) BY MOUTH DAILY  . aspirin 81 MG chewable tablet Chew 81 mg by mouth daily.  Marland Kitchen atorvastatin (LIPITOR) 20 MG tablet TAKE 1 TABLET BY MOUTH DAILY  . Budeson-Glycopyrrol-Formoterol (BREZTRI AEROSPHERE) 160-9-4.8 MCG/ACT AERO Inhale 2 puffs into the lungs in the morning and at bedtime.  . [EXPIRED] Budeson-Glycopyrrol-Formoterol (BREZTRI  AEROSPHERE) 160-9-4.8 MCG/ACT AERO Inhale 2 puffs into the lungs in the morning and at bedtime for 1 day.  . Cyanocobalamin (B-12 PO) Take 1,000 mcg by mouth daily.   . metoprolol succinate (TOPROL XL) 25 MG 24 hr tablet Take 1 tablet (25 mg total) by mouth daily.  . sacubitril-valsartan (ENTRESTO) 24-26 MG Take 1 tablet by mouth 2 (two) times daily.  . [DISCONTINUED] furosemide (LASIX) 20 MG tablet Take 1 tablet (20 mg total) by mouth daily.   Immunization History  Administered Date(s) Administered  . Influenza Split 07/31/2011, 08/08/2012, 09/01/2013  . Influenza Whole 10/05/2004, 08/16/2007, 08/11/2008, 08/06/2009  . Influenza, High Dose Seasonal PF 07/27/2016, 08/09/2017, 08/01/2018, 07/25/2019, 07/09/2020  . Influenza,inj,Quad PF,6+ Mos 06/22/2014, 10/14/2015  . Influenza-Unspecified 08/30/2018  . Pneumococcal Conjugate-13 06/22/2014  . Pneumococcal Polysaccharide-23 04/14/2009  . Td 10/31/1997, 05/13/2010, 12/05/2019       Objective:   Physical Exam BP 100/68 (BP Location: Left Arm, Cuff Size: Normal)   Pulse 62   Temp (!) 97 F (36.1 C) (Temporal)   Ht _0  (1.626 m)   Wt 170 lb 6.4 oz (77.3 kg)   SpO2 96%   BMI 29.25 kg/m   GENERAL: Well-developed well-nourished gentleman, no acute distress on 2 L nasal cannula O2.  No conversational dyspnea. HEAD: Normocephalic, atraumatic.  EYES:  Pupils equal, round, reactive to light.  No scleral icterus.  Corneal opacity right. MOUTH: Nose/mouth/throat not examined due to masking requirements for COVID 19. NECK: Supple. No thyromegaly. Trachea midline. No JVD.  No adenopathy. PULMONARY: Good air entry bilaterally.  Bilateral Velcro crackles at the bases, no adventitious sounds. CARDIOVASCULAR: S1 and S2. Regular rate and rhythm.  Grade 2/6 systolic ejection murmur at the aortic area.  No gallops or rubs noted. ABDOMEN: Benign. MUSCULOSKELETAL: Osteoarthritis changes both hands, no clubbing, no edema.  NEUROLOGIC: No focal deficit,  no gait disturbance, fluent speech  SKIN: Intact,warm,dry.  On limited exam no rashes. PSYCH: Mood and behavior normal.      Assessment & Plan:   .   ICD-10-CM   1. Chronic respiratory failure with hypoxia (HCC)  J96.11    Continue oxygen supplementation May increase O2 flow to 4 to 5 L during ambulation  2. Pulmonary fibrosis (HCC)  J84.10    Advanced, prognosis guarded Associated pulmonary hypertension Tyvaso as drug of choice for pulmonary hypertension in this instance  3. Chronic systolic CHF (congestive heart failure) (HCC)  I50.22    Being managed by the advanced heart failure clinic  4. Pulmonary hypertension (HCC) - severe  I27.20    Pending initiation of Tyvaso Tyvaso has shown improved lung function in IPF patients with PAH Continue oxygen supplementation  5. COPD not affecting current episode of care [facility])  J44.9    Continue Breztri Difficult to classify due to concomitant fibrosis   Meds ordered this encounter  Medications  . Budeson-Glycopyrrol-Formoterol (BREZTRI AEROSPHERE) 160-9-4.8 MCG/ACT AERO    Sig: Inhale 2 puffs into the lungs in the morning and at bedtime for 1 day.    Dispense:  5.9 g    Refill:  0    Order Specific Question:   Lot Number?    Answer:   [id number]    Order Specific Question:   Expiration Date?    Answer:   05/30/2022    Order Specific Question:   Manufacturer?    Answer:   [employer] [[id number]]    Order Specific Question:   Quantity    Answer:   3   Discussion:  Patient is now being followed by the advanced heart failure clinic.  He has been prescribed Tyvaso and he is pending approval of this medication.  He has noted that he has been doing somewhat better with Breztri and oxygen.  Prognosis is guarded due to severe pulmonary fibrosis, severe pulmonary hypertension, advanced age and respiratory failure with hypoxia.  We will continue supportive care.  Recommend initiation of N-acetylcysteine (NAC) 600 mg twice a day with food this  supplement has been noted to be helpful in patients with pulmonary fibrosis.  We will see the patient in 3 months time he is to call sooner should any new problems arise.  Renold Don, MD [facility]   *This note was dictated using voice recognition software/Dragon.  Despite best efforts to proofread, errors can occur which can change the meaning.  Any change was purely unintentional.

## 2021-01-14 DIAGNOSIS — J84112 Idiopathic pulmonary fibrosis: Secondary | ICD-10-CM | POA: Diagnosis not present

## 2021-01-14 DIAGNOSIS — J449 Chronic obstructive pulmonary disease, unspecified: Secondary | ICD-10-CM | POA: Diagnosis not present

## 2021-01-14 DIAGNOSIS — D692 Other nonthrombocytopenic purpura: Secondary | ICD-10-CM | POA: Diagnosis not present

## 2021-01-14 DIAGNOSIS — F331 Major depressive disorder, recurrent, moderate: Secondary | ICD-10-CM | POA: Diagnosis not present

## 2021-01-14 DIAGNOSIS — I272 Pulmonary hypertension, unspecified: Secondary | ICD-10-CM | POA: Diagnosis not present

## 2021-01-14 DIAGNOSIS — E1122 Type 2 diabetes mellitus with diabetic chronic kidney disease: Secondary | ICD-10-CM | POA: Diagnosis not present

## 2021-01-14 DIAGNOSIS — F0390 Unspecified dementia without behavioral disturbance: Secondary | ICD-10-CM | POA: Diagnosis not present

## 2021-01-14 DIAGNOSIS — E785 Hyperlipidemia, unspecified: Secondary | ICD-10-CM | POA: Diagnosis not present

## 2021-01-14 DIAGNOSIS — I130 Hypertensive heart and chronic kidney disease with heart failure and stage 1 through stage 4 chronic kidney disease, or unspecified chronic kidney disease: Secondary | ICD-10-CM | POA: Diagnosis not present

## 2021-01-14 DIAGNOSIS — E1151 Type 2 diabetes mellitus with diabetic peripheral angiopathy without gangrene: Secondary | ICD-10-CM | POA: Diagnosis not present

## 2021-01-14 DIAGNOSIS — I5022 Chronic systolic (congestive) heart failure: Secondary | ICD-10-CM | POA: Diagnosis not present

## 2021-01-14 DIAGNOSIS — I4892 Unspecified atrial flutter: Secondary | ICD-10-CM | POA: Diagnosis not present

## 2021-01-14 NOTE — Telephone Encounter (Signed)
Patient's medical PA for Tyvaso has been denied. Insurance will not cover Tyvaso for him. The patient has been referred to [employer] assistance program. [facility] phone number is [phone].  Called [facility] to start the referral for PAP. The representative is going to reach out to the patient today to start the process for assistance.   Will follow up.

## 2021-01-17 NOTE — Telephone Encounter (Signed)
Spoke with [facility] [employer]. The patient is being mailed out an assistance packet to complete and return to determine financial assistance with Tyvaso. They should receive the packet by Wednesday.

## 2021-01-18 ENCOUNTER — Telehealth (HOSPITAL_COMMUNITY): Payer: Self-pay | Admitting: Emergency Medicine

## 2021-01-18 NOTE — Telephone Encounter (Signed)
Reaching out to patient to offer assistance regarding upcoming cardiac imaging study; pt verbalizes understanding of appt date/time, parking situation and where to check in,  and verified current allergies; name and call back number provided for further questions should they arise Marchia Bond RN [facility] [phone] office [phone] cell   Spoke with wife as patient is [facility]. Denies implants, denies clasutro Clarise Cruz

## 2021-01-19 ENCOUNTER — Ambulatory Visit (HOSPITAL_COMMUNITY)
Admission: RE | Admit: 2021-01-19 | Discharge: 2021-01-19 | Disposition: A | Discharge status: Home / Self Care | Payer: PPO | Source: Ambulatory Visit | Attending: Cardiology | Admitting: Cardiology

## 2021-01-19 ENCOUNTER — Other Ambulatory Visit: Payer: Self-pay

## 2021-01-19 DIAGNOSIS — I428 Other cardiomyopathies: Secondary | ICD-10-CM | POA: Diagnosis not present

## 2021-01-19 MED ORDER — GADOBUTROL 1 MMOL/ML IV SOLN
8.0000 mL | Freq: Once | INTRAVENOUS | Status: AC | PRN
  Administered 2021-01-19: 8 mL via INTRAVENOUS

## 2021-01-21 ENCOUNTER — Encounter: Payer: Self-pay | Admitting: Pulmonary Disease

## 2021-01-21 ENCOUNTER — Encounter (HOSPITAL_COMMUNITY): Payer: Self-pay | Admitting: Cardiology

## 2021-01-21 ENCOUNTER — Ambulatory Visit (HOSPITAL_COMMUNITY)
Admission: RE | Admit: 2021-01-21 | Discharge: 2021-01-21 | Disposition: A | Discharge status: Home / Self Care | Payer: PPO | Source: Ambulatory Visit | Attending: Cardiology | Admitting: Cardiology

## 2021-01-21 ENCOUNTER — Other Ambulatory Visit: Payer: Self-pay

## 2021-01-21 VITALS — BP 120/70 | HR 80 | Wt 170.4 lb

## 2021-01-21 DIAGNOSIS — I5022 Chronic systolic (congestive) heart failure: Secondary | ICD-10-CM | POA: Diagnosis not present

## 2021-01-21 DIAGNOSIS — Z8616 Personal history of COVID-19: Secondary | ICD-10-CM | POA: Diagnosis not present

## 2021-01-21 DIAGNOSIS — Z87891 Personal history of nicotine dependence: Secondary | ICD-10-CM | POA: Diagnosis not present

## 2021-01-21 DIAGNOSIS — I428 Other cardiomyopathies: Secondary | ICD-10-CM | POA: Diagnosis not present

## 2021-01-21 DIAGNOSIS — Z8249 Family history of ischemic heart disease and other diseases of the circulatory system: Secondary | ICD-10-CM | POA: Insufficient documentation

## 2021-01-21 DIAGNOSIS — J449 Chronic obstructive pulmonary disease, unspecified: Secondary | ICD-10-CM | POA: Insufficient documentation

## 2021-01-21 DIAGNOSIS — Z79899 Other long term (current) drug therapy: Secondary | ICD-10-CM | POA: Insufficient documentation

## 2021-01-21 DIAGNOSIS — I2721 Secondary pulmonary arterial hypertension: Secondary | ICD-10-CM | POA: Insufficient documentation

## 2021-01-21 DIAGNOSIS — I2510 Atherosclerotic heart disease of native coronary artery without angina pectoris: Secondary | ICD-10-CM | POA: Diagnosis not present

## 2021-01-21 DIAGNOSIS — I5082 Biventricular heart failure: Secondary | ICD-10-CM | POA: Diagnosis not present

## 2021-01-21 DIAGNOSIS — Z7982 Long term (current) use of aspirin: Secondary | ICD-10-CM | POA: Diagnosis not present

## 2021-01-21 DIAGNOSIS — I272 Pulmonary hypertension, unspecified: Secondary | ICD-10-CM | POA: Diagnosis not present

## 2021-01-21 DIAGNOSIS — G4733 Obstructive sleep apnea (adult) (pediatric): Secondary | ICD-10-CM | POA: Diagnosis not present

## 2021-01-21 DIAGNOSIS — I4510 Unspecified right bundle-branch block: Secondary | ICD-10-CM | POA: Insufficient documentation

## 2021-01-21 DIAGNOSIS — E785 Hyperlipidemia, unspecified: Secondary | ICD-10-CM | POA: Insufficient documentation

## 2021-01-21 DIAGNOSIS — I110 Hypertensive heart disease with heart failure: Secondary | ICD-10-CM | POA: Insufficient documentation

## 2021-01-21 DIAGNOSIS — J8410 Pulmonary fibrosis, unspecified: Secondary | ICD-10-CM | POA: Diagnosis not present

## 2021-01-21 DIAGNOSIS — I4892 Unspecified atrial flutter: Secondary | ICD-10-CM | POA: Insufficient documentation

## 2021-01-21 LAB — BASIC METABOLIC PANEL
Anion gap: 8 (ref 5–15)
BUN: 21 mg/dL (ref 8–23)
CO2: 30 mmol/L (ref 22–32)
Calcium: 8.8 mg/dL — ABNORMAL LOW (ref 8.9–10.3)
Chloride: 102 mmol/L (ref 98–111)
Creatinine, Ser: 1.28 mg/dL — ABNORMAL HIGH (ref 0.61–1.24)
GFR, Estimated: 55 mL/min — ABNORMAL LOW (ref >60)
Glucose, Bld: 105 mg/dL — ABNORMAL HIGH (ref 70–99)
Potassium: 3.4 mmol/L — ABNORMAL LOW (ref 3.5–5.1)
Sodium: 140 mmol/L (ref 135–145)

## 2021-01-21 LAB — BRAIN NATRIURETIC PEPTIDE: B Natriuretic Peptide: 1967.5 pg/mL — ABNORMAL HIGH (ref 0.0–100.0)

## 2021-01-21 MED ORDER — FUROSEMIDE 40 MG PO TABS: 40.0000 mg | ORAL_TABLET | Freq: Every day | ORAL | 3 refills | Status: DC

## 2021-01-21 MED ORDER — SPIRONOLACTONE 25 MG PO TABS: 12.5000 mg | ORAL_TABLET | Freq: Every day | ORAL | 3 refills | Status: DC

## 2021-01-21 MED ORDER — DAPAGLIFLOZIN PROPANEDIOL 10 MG PO TABS: 10.0000 mg | ORAL_TABLET | Freq: Every day | ORAL | 6 refills | Status: DC

## 2021-01-21 NOTE — Patient Instructions (Addendum)
Start Farxiga 10 mg Daily  Start Spironolactone 12.5 mg (1/2 tab) Daily  Increase Furosemide to 40 mg Daily  Labs done today, we will call you for abnormal results  Your physician recommends that you return for lab work in: 10-14 days, we have provided you a prescription to have this done locally  VQ Scan and chest x-ray needed  Please be on the lookout for a package that should be coming in the mail regarding your Tyvaso medication patient assistance, once received please complete and mail back to the company, let us know if you have any questions  Please call and set up your CPAP equipment  Your physician recommends that you schedule a follow-up appointment in: 4 weeks  If you have any questions or concerns before your next appointment please send [place] a message through [place] or call our office at [phone].    TO LEAVE A MESSAGE FOR THE NURSE SELECT OPTION 2, PLEASE LEAVE A MESSAGE INCLUDING: . YOUR NAME . DATE OF BIRTH . CALL BACK NUMBER . REASON FOR CALL**this is important as we prioritize the call backs  [place] AS LONG AS YOU CALL BEFORE 4:00 PM  At the [facility], you and your health needs are our priority. As part of our continuing mission to provide you with exceptional heart care, we have created designated Provider Care Teams. These Care Teams include your primary Cardiologist (physician) and Advanced Practice Providers (APPs- Physician Assistants and Nurse Practitioners) who all work together to provide you with the care you need, when you need it.   You may see any of the following providers on your designated Care Team at your next follow up: Marland Kitchen Dr Glori Bickers . Dr Loralie Champagne . Dr Vickki Muff . Darrick Grinder, NP . Lyda Jester, [place] . Audry Riles, PharmD   Please be sure to bring in all your medications bottles to every appointment.

## 2021-01-23 NOTE — Progress Notes (Signed)
PCP: [name], MD Cardiology: Dr. Fletcher Anon HF Cardiology: Dr. Aundra Dubin  87 y.o. with history of atrial flutter, pulmonary fibrosis, and chronic systolic CHF was referred by Dr. Fletcher Anon for evaluation of CHF and pulmonary hypertension.  Patient has had pulmonary fibrosis since 2013, thought to be related to working in a [employer], had exposure to asbestos.  Last CT chest in 12/21 showed progressive fibrosis and honeycombing.  In 1/15, patient was noted to be in atrial flutter.  Echo at the time showed EF 25-30% and cath showed minimal CAD.  He had atria fibrillation ablation in 2/15 and anticoagulation was later stopped.  Echo in 8/16 showed EF back to normal range.  In 10/21, he had COVID-19 PNA.  He feels like he has never recovered from this.  Prior to 10/21, he was short of breath after weed-eating for about 15 minutes.  After 10/21, progressive dyspnea and required home oxygen.    Echo was done in 2/22 due to worsening symptoms.  This showed EF 25-30% with moderate RV dysfunction.  RHC/LHC was then done in 2/22, showing mild luminal irregularities, elevated right heart filling pressure and severe pulmonary arterial hypertension with normal PCWP.  Cardiac MRI in 3/22 showed EF 28%, diffuse hypokinesis, severe RV dilation with akinetic mid to apical RV wall and RV EF 30%, D-shaped septum, nonspecific RV insertion site LGE.    Patient returns for followup of CHF, weight down 8 lbs.  Waiting for CPAP and Tyvaso.  Less edema but still short of breath walking around the house.  No palpitations.  Rare atypical chest pain.  No orthopnea/PND.    Labs (2/22): K 4, creatinine 1.23 => 1.18, hgb 14.2, BNP 1273  ECG (personally reviewed): NSR, RVH, RBBB  PMH: 1. Atrial flutter: S/p ablation 2/15.  2. Type 2 diabetes 3. HTN 4. Hyperlipidemia 5. Ascending aortic aneurysm: Small, 41 mm on 2/22 echo.  6. Pulmonary fibrosis: Exposure to environmental toxins including asbestos while working [employer].    Fibrosis was found in 2013.  - CT chest (12/21): Progressive ILD in UIP pattern with honeycombing.  - PFTs (2/22): FVC 66%, FEV1 77%, ratio 80%, TLC 47% => restrictive pattern.  7. COVID-19 infection 10/21.  8. COPD: Prior smoker.  9. 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   - Echo (8/16) with EF normal range.  - Echo (2/22) with EF 25-30%, global hypokinesis, moderately decreased RV systolic function (no atrial arrhythmia).  - RHC/LHC (2/22): Mild nonobstructive CAD; mean RA 17, PA 70/27 mean 44, mean PCWP 13, CI 2.13, PVR 8.12.  - Cardiac MRI (3/22): EF 28%, diffuse hypokinesis, severe RV dilation with akinetic mid to apical RV wall and RV EF 30%, D-shaped septum, nonspecific RV insertion site LGE. 10. Pulmonary hypertension: RHC (2/22) with mean RA 17, PA 70/27 mean 44, mean PCWP 13, CI 2.13, PVR 8.12. 11. OSA  SH: Retired Civil engineer, contracting, married, lives in [place].  Enjoys fishing.  Remote smoker, no ETOH.   Family History  Problem Relation Age of Onset  . Coronary artery disease Sister   . Heart failure Mother    ROS: All systems reviewed and negative except as per HPI.   Current Outpatient Medications  Medication Sig Dispense Refill  . albuterol (VENTOLIN HFA) 108 (90 Base) MCG/ACT inhaler Inhale 2 puffs into the lungs every 4 (four) hours as needed for shortness of breath or wheezing. 18 g 0  .  allopurinol (ZYLOPRIM) 100 MG tablet TAKE 1 TABLET(100 MG) BY MOUTH DAILY 90 tablet 0  . atorvastatin (LIPITOR) 20 MG tablet TAKE 1 TABLET BY MOUTH DAILY 90 tablet 0  . Budeson-Glycopyrrol-Formoterol (BREZTRI AEROSPHERE) 160-9-4.8 MCG/ACT AERO Inhale 2 puffs into the lungs in the morning and at bedtime.    . Cyanocobalamin (B-12 PO) Take 1,000 mcg by mouth daily.     . dapagliflozin propanediol (FARXIGA) 10 MG TABS tablet Take 1 tablet (10 mg total) by mouth daily before breakfast. 30 tablet 6  .  metoprolol succinate (TOPROL XL) 25 MG 24 hr tablet Take 1 tablet (25 mg total) by mouth daily. 30 tablet 5  . sacubitril-valsartan (ENTRESTO) 24-26 MG Take 1 tablet by mouth 2 (two) times daily. 60 tablet 3  . spironolactone (ALDACTONE) 25 MG tablet Take 0.5 tablets (12.5 mg total) by mouth daily. 15 tablet 3  . aspirin 81 MG chewable tablet Chew 81 mg by mouth daily.    . furosemide (LASIX) 40 MG tablet Take 1 tablet (40 mg total) by mouth daily. 30 tablet 3  . Treprostinil (TYVASO) 0.6 MG/ML SOLN Inhale 18 mcg into the lungs in the morning, at noon, in the evening, and at bedtime. (Patient not taking: Reported on 01/13/2021)     No current facility-administered medications for this encounter.   BP 120/70   Pulse 80   Wt 77.3 kg (170 lb 6.4 oz)   SpO2 98% Comment: 3 l n/c  BMI 29.25 kg/m  General: NAD Neck: JVP 9-10 cm with HJR, no thyromegaly or thyroid nodule.  Lungs: Clear to auscultation bilaterally with normal respiratory effort. CV: Nondisplaced PMI.  Heart regular S1/S2, no S3/S4, no murmur.  1+ ankle edema.  No carotid bruit.  Normal pedal pulses.  Abdomen: Soft, nontender, no hepatosplenomegaly, no distention.  Skin: Intact without lesions or rashes.  Neurologic: Alert and oriented x 3.  Psych: Normal affect. Extremities: No clubbing or cyanosis.  HEENT: Normal.    Assessment/Plan: 1. Chronic systolic CHF: Nonischemic cardiomyopathy, cath in 2/22 with nonobstructive CAD.  In past, he was thought to have tachy-mediated CMP.  However, he has not had any recent prolonged fibrillation or flutter, so this does not seem to be the culprit for the recurrent drop in EF. Echo in 2/22 showed biventricular failure with LV EF 25-30% and moderate RV dysfunction (RVH on echo).  RHC in 2/22 showed elevated right-sided filling pressure and pulmonary arterial hypertension.  Cardiac MRI in 3/22 showed EF 28%, diffuse hypokinesis, severe RV dilation with akinetic mid to apical RV wall and RV EF 30%,  D-shaped septum, nonspecific RV insertion site LGE.  RV failure most likely is cor pulmonale with pulmonary fibrosis and pulmonary hypertension, but regional wall motion abnormalities (apex and mid-wall RV akinesis) brings up concern for ARVC (though no evidence for fibrofatty RV infiltration on MRI).  Weight is down with starting Lasix but still has NYHA class IIIb symptoms (likely due to both CHF and pulmonary fibrosis) and is still volume overloaded on exam.  - Increase Lasix to 40 mg daily with BMET today and in 10 days.  - Continue Entresto 24/26 bid and stop losartan.  - Continue Toprol XL 25 mg daily.  - Add Farxiga 10 mg daily.  - Add spironolactone 12.5 mg daily.  - Given age, would not pursue ICD.  Narrow QRS so not CRT candidate.  2. 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 PVR 8.1 on 2/22  cath. Possible group 1 PH component.  PH is contributing to RV systolic dysfunction.  - I think he would be a good candidate for Tyvaso for PH-ILD, still awaiting insurance approval.   - Has OSA, waiting for CPAP. - Arrange for V/Q scan to rule out chronic PEs.  3. Pulmonary fibrosis: Restrictive PFTs in 2/22.  CT chest in 12/21 with progressive pulmonary fibrosis and honeycombing.  COVID-19 lung involvement could have worsened pulmonary parenchymal disease.        - He follows with pulmonary, would he be a candidate for antifibrotic therapy?  - Continue home oxygen.  4. Atrial flutter: Noted in 1/15, had ablation in 2/15, no atrial arrhythmias documented since that time.  He is in NSR today.  He is not anticoagulated.   Followup in 3 wks with NP/PA.   Loralie Champagne 01/23/2021

## 2021-01-25 ENCOUNTER — Telehealth (HOSPITAL_COMMUNITY): Payer: Self-pay | Admitting: *Deleted

## 2021-01-25 MED ORDER — EMPAGLIFLOZIN 10 MG PO TABS: 10.0000 mg | ORAL_TABLET | Freq: Every day | ORAL | 6 refills | Status: DC

## 2021-01-25 NOTE — Telephone Encounter (Signed)
Wife/Patient called back to move forward with getting husbands cpap. Order has been placed.

## 2021-01-25 NOTE — Telephone Encounter (Signed)
Caleb Bryant is not covered by pt's insurance, change to [name] 10 mg Daily per Dr Aundra Dubin, pt's wife is aware, they will finish the farxiga they have then pick up the [place]. Med list updated and new rx sent to pharmacy

## 2021-01-26 ENCOUNTER — Telehealth (HOSPITAL_COMMUNITY): Payer: Self-pay | Admitting: Pharmacy Technician

## 2021-01-26 NOTE — Telephone Encounter (Signed)
Sent in the prescriber's portion of the [employer] application via fax. They have already received the patient's portion of the application. This should be the final step to getting the patient on Tyvaso therapy.   Will follow up.

## 2021-01-28 ENCOUNTER — Telehealth (HOSPITAL_COMMUNITY): Payer: Self-pay | Admitting: Pharmacy Technician

## 2021-01-28 NOTE — Telephone Encounter (Signed)
Advanced Heart Failure Patient Advocate Encounter   Patient was approved to receive Tyvaso from [employer]  Patient ID: [id number] Effective dates: 01/26/21 through 10/29/21  Called and spoke with the patient's wife. The RX will come from [facility]. Phone number, [phone] .  Charlann Boxer, CPhT

## 2021-01-31 ENCOUNTER — Other Ambulatory Visit
Admission: RE | Admit: 2021-01-31 | Discharge: 2021-01-31 | Disposition: A | Discharge status: Home / Self Care | Payer: PPO | Source: Ambulatory Visit | Attending: Cardiology | Admitting: Cardiology

## 2021-01-31 DIAGNOSIS — I5022 Chronic systolic (congestive) heart failure: Secondary | ICD-10-CM | POA: Insufficient documentation

## 2021-01-31 LAB — BASIC METABOLIC PANEL
Anion gap: 9 (ref 5–15)
BUN: 29 mg/dL — ABNORMAL HIGH (ref 8–23)
CO2: 30 mmol/L (ref 22–32)
Calcium: 9.2 mg/dL (ref 8.9–10.3)
Chloride: 102 mmol/L (ref 98–111)
Creatinine, Ser: 1.16 mg/dL (ref 0.61–1.24)
GFR, Estimated: >60 mL/min (ref >60)
Glucose, Bld: 125 mg/dL — ABNORMAL HIGH (ref 70–99)
Potassium: 3.2 mmol/L — ABNORMAL LOW (ref 3.5–5.1)
Sodium: 141 mmol/L (ref 135–145)

## 2021-01-31 NOTE — Addendum Note (Signed)
Addended by: Santiago Bur on: 01/31/2021 03:42 PM   Modules accepted: Orders

## 2021-02-02 ENCOUNTER — Other Ambulatory Visit: Payer: Self-pay

## 2021-02-02 ENCOUNTER — Telehealth (HOSPITAL_COMMUNITY): Payer: Self-pay | Admitting: Cardiology

## 2021-02-02 ENCOUNTER — Encounter
Admission: RE | Admit: 2021-02-02 | Discharge: 2021-02-02 | Disposition: A | Discharge status: Home / Self Care | Payer: PPO | Source: Ambulatory Visit | Attending: Cardiology | Admitting: Cardiology

## 2021-02-02 ENCOUNTER — Ambulatory Visit
Admission: RE | Admit: 2021-02-02 | Discharge: 2021-02-02 | Disposition: A | Discharge status: Home / Self Care | Payer: PPO | Source: Ambulatory Visit | Attending: Cardiology | Admitting: Cardiology

## 2021-02-02 DIAGNOSIS — J479 Bronchiectasis, uncomplicated: Secondary | ICD-10-CM | POA: Diagnosis not present

## 2021-02-02 DIAGNOSIS — I272 Pulmonary hypertension, unspecified: Secondary | ICD-10-CM | POA: Diagnosis not present

## 2021-02-02 DIAGNOSIS — I5022 Chronic systolic (congestive) heart failure: Secondary | ICD-10-CM

## 2021-02-02 DIAGNOSIS — J8489 Other specified interstitial pulmonary diseases: Secondary | ICD-10-CM | POA: Diagnosis not present

## 2021-02-02 DIAGNOSIS — I10 Essential (primary) hypertension: Secondary | ICD-10-CM | POA: Diagnosis not present

## 2021-02-02 DIAGNOSIS — J84112 Idiopathic pulmonary fibrosis: Secondary | ICD-10-CM | POA: Diagnosis not present

## 2021-02-02 MED ORDER — POTASSIUM CHLORIDE CRYS ER 20 MEQ PO TBCR: 20.0000 meq | EXTENDED_RELEASE_TABLET | Freq: Every day | ORAL | 3 refills | Status: DC

## 2021-02-02 MED ORDER — TECHNETIUM TO 99M ALBUMIN AGGREGATED
4.0000 | Freq: Once | INTRAVENOUS | Status: AC | PRN
  Administered 2021-02-02: 3.799 via INTRAVENOUS

## 2021-02-02 NOTE — Telephone Encounter (Signed)
Pt aware via wife

## 2021-02-02 NOTE — Telephone Encounter (Signed)
-----  Message from Larey Dresser, MD sent at 01/31/2021 10:38 PM EDT ----- Increase total daily KCl by 20 mEq.  BMET 10 days.

## 2021-02-07 ENCOUNTER — Telehealth (HOSPITAL_COMMUNITY): Payer: Self-pay | Admitting: Pharmacist

## 2021-02-07 NOTE — Telephone Encounter (Signed)
Received message from [facility] that patient has been started on Tyvaso. He tolerated first session well.   Audry Riles, PharmD, BCPS, BCCP, CPP Heart Failure Clinic Pharmacist [phone]

## 2021-02-11 ENCOUNTER — Other Ambulatory Visit
Admission: RE | Admit: 2021-02-11 | Discharge: 2021-02-11 | Disposition: A | Discharge status: Home / Self Care | Payer: PPO | Attending: Cardiology | Admitting: Cardiology

## 2021-02-11 ENCOUNTER — Telehealth (HOSPITAL_COMMUNITY): Payer: Self-pay

## 2021-02-11 DIAGNOSIS — I4892 Unspecified atrial flutter: Secondary | ICD-10-CM | POA: Diagnosis not present

## 2021-02-11 DIAGNOSIS — E785 Hyperlipidemia, unspecified: Secondary | ICD-10-CM | POA: Diagnosis not present

## 2021-02-11 DIAGNOSIS — I5022 Chronic systolic (congestive) heart failure: Secondary | ICD-10-CM | POA: Insufficient documentation

## 2021-02-11 DIAGNOSIS — E1151 Type 2 diabetes mellitus with diabetic peripheral angiopathy without gangrene: Secondary | ICD-10-CM | POA: Diagnosis not present

## 2021-02-11 DIAGNOSIS — J449 Chronic obstructive pulmonary disease, unspecified: Secondary | ICD-10-CM | POA: Diagnosis not present

## 2021-02-11 DIAGNOSIS — J84112 Idiopathic pulmonary fibrosis: Secondary | ICD-10-CM | POA: Diagnosis not present

## 2021-02-11 DIAGNOSIS — I130 Hypertensive heart and chronic kidney disease with heart failure and stage 1 through stage 4 chronic kidney disease, or unspecified chronic kidney disease: Secondary | ICD-10-CM | POA: Diagnosis not present

## 2021-02-11 DIAGNOSIS — F0390 Unspecified dementia without behavioral disturbance: Secondary | ICD-10-CM | POA: Diagnosis not present

## 2021-02-11 DIAGNOSIS — E46 Unspecified protein-calorie malnutrition: Secondary | ICD-10-CM | POA: Diagnosis not present

## 2021-02-11 DIAGNOSIS — I272 Pulmonary hypertension, unspecified: Secondary | ICD-10-CM | POA: Diagnosis not present

## 2021-02-11 DIAGNOSIS — D692 Other nonthrombocytopenic purpura: Secondary | ICD-10-CM | POA: Diagnosis not present

## 2021-02-11 DIAGNOSIS — E1122 Type 2 diabetes mellitus with diabetic chronic kidney disease: Secondary | ICD-10-CM | POA: Diagnosis not present

## 2021-02-11 LAB — BASIC METABOLIC PANEL
Anion gap: 10 (ref 5–15)
BUN: 23 mg/dL (ref 8–23)
CO2: 25 mmol/L (ref 22–32)
Calcium: 9 mg/dL (ref 8.9–10.3)
Chloride: 103 mmol/L (ref 98–111)
Creatinine, Ser: 1.32 mg/dL — ABNORMAL HIGH (ref 0.61–1.24)
GFR, Estimated: 53 mL/min — ABNORMAL LOW (ref >60)
Glucose, Bld: 119 mg/dL — ABNORMAL HIGH (ref 70–99)
Potassium: 4.3 mmol/L (ref 3.5–5.1)
Sodium: 138 mmol/L (ref 135–145)

## 2021-02-11 NOTE — Telephone Encounter (Signed)
error 

## 2021-02-14 ENCOUNTER — Ambulatory Visit (INDEPENDENT_AMBULATORY_CARE_PROVIDER_SITE_OTHER): Payer: PPO

## 2021-02-14 DIAGNOSIS — Z0000 Encounter for general adult medical examination without abnormal findings: Secondary | ICD-10-CM | POA: Diagnosis not present

## 2021-02-14 NOTE — Progress Notes (Signed)
PCP notes:  Health Maintenance: Eye exam- due Foot exam- due   Abnormal Screenings: none   Patient concerns: Feet cold and painful at times   Nurse concerns: none   Next PCP appt.: 03/04/21 @ 2 pm

## 2021-02-14 NOTE — Progress Notes (Signed)
Subjective:   Caleb Ciresi Drotar Sr. is a 87 y.o. male who presents for Medicare Annual/Subsequent preventive examination.  Review of Systems: N/A      I connected with the patient today by telephone and verified that I am speaking with the correct person using two identifiers. Location patient: home Location nurse: work Persons participating in the telephone visit: patient, nurse.   I discussed the limitations, risks, security and privacy concerns of performing an evaluation and management service by telephone and the availability of in person appointments. I also discussed with the patient that there may be a patient responsible charge related to this service. The patient expressed understanding and verbally consented to this telephonic visit.        Cardiac Risk Factors include: advanced age (>3mn, >>75women);male gender;diabetes mellitus;hypertension;Other (see comment), Risk factor comments: hypercholesterolemia     Objective:    Today's Vitals   There is no height or weight on file to calculate BMI.  Advanced Directives 02/14/2021 07/30/2020 07/29/2020 08/13/2019 12/13/2018 11/22/2018 11/15/2017  Does Patient Have a Medical Advance Directive? _0  No No  Would patient like information on creating a medical advance directive? No - Patient declined No - Patient declined No - Patient declined - No - Patient declined No - Patient declined No - Patient declined    Current Medications (verified) Outpatient Encounter Medications as of 02/14/2021  Medication Sig  . albuterol (VENTOLIN HFA) 108 (90 Base) MCG/ACT inhaler Inhale 2 puffs into the lungs every 4 (four) hours as needed for shortness of breath or wheezing.  .Marland Kitchenallopurinol (ZYLOPRIM) 100 MG tablet TAKE 1 TABLET(100 MG) BY MOUTH DAILY  . aspirin 81 MG chewable tablet Chew 81 mg by mouth daily.  .Marland Kitchenatorvastatin (LIPITOR) 20 MG tablet TAKE 1 TABLET BY MOUTH DAILY  . Budeson-Glycopyrrol-Formoterol (BREZTRI AEROSPHERE)  160-9-4.8 MCG/ACT AERO Inhale 2 puffs into the lungs in the morning and at bedtime.  . Cyanocobalamin (B-12 PO) Take 1,000 mcg by mouth daily.   . empagliflozin ([name]) 10 MG TABS tablet Take 1 tablet (10 mg total) by mouth daily before breakfast.  . furosemide (LASIX) 40 MG tablet Take 1 tablet (40 mg total) by mouth daily.  . metoprolol succinate (TOPROL XL) 25 MG 24 hr tablet Take 1 tablet (25 mg total) by mouth daily.  . potassium chloride SA (KLOR-CON) 20 MEQ tablet Take 1 tablet (20 mEq total) by mouth daily.  . sacubitril-valsartan (ENTRESTO) 24-26 MG Take 1 tablet by mouth 2 (two) times daily.  .Marland Kitchenspironolactone (ALDACTONE) 25 MG tablet Take 0.5 tablets (12.5 mg total) by mouth daily.  . Treprostinil (TYVASO) 0.6 MG/ML SOLN Inhale 18 mcg into the lungs in the morning, at noon, in the evening, and at bedtime.   No facility-administered encounter medications on file as of 02/14/2021.    Allergies (verified) Codeine   History: Past Medical History:  Diagnosis Date  . Anxiety   . Atrial flutter ([place]    "just dx'd today" (11/04/2013)  . Benign prostatic hypertrophy   . Borderline diabetes   . CHF (congestive heart failure) ([place]   . Colon, diverticulosis   . COPD (chronic obstructive pulmonary disease) ([place]   . Dysrhythmia   . Exertional shortness of breath   . Gout   . Hyperlipidemia   . Hypertension   . Osteoarthritis    "all over" (11/04/2013)  . PVD (peripheral vascular disease) ([place]    Past Surgical History:  Procedure Laterality Date  . albation  11/04/13  . ANKLE FRACTURE SURGERY Right   . APPENDECTOMY  1997   "days before 1st colon OR"  . ATRIAL FLUTTER ABLATION N/A 11/26/2013   Procedure: ATRIAL FLUTTER ABLATION;  Surgeon: Evans Lance, MD;  Location[facility] CATH LAB;  Service: Cardiovascular;  Laterality: N/A;  . CATARACT EXTRACTION, BILATERAL Bilateral   . CHOLECYSTECTOMY  12/03  . COLON SURGERY     "diverticulitis"  . COLONOSCOPY WITH  ESOPHAGOGASTRODUODENOSCOPY (EGD)  12/00   gastic polyp   . COLONOSCOPY WITH ESOPHAGOGASTRODUODENOSCOPY (EGD)  11/07,12/2011   gastritis, 2 gastic polyps  . COLOSTOMY  1997  . COLOSTOMY REVERSAL     "think I wore it 4 months" (11/04/2013)  . CORNEAL TRANSPLANT Right   . CORONARY ANGIOGRAM  11/05/2013   Procedure: CORONARY ANGIOGRAM;  Surgeon: Wellington Hampshire, MD;  Location: [facility] CATH LAB;  Service: Cardiovascular;;  . ESOPHAGOGASTRODUODENOSCOPY (EGD) WITH PROPOFOL N/A 08/13/2019   Procedure: ESOPHAGOGASTRODUODENOSCOPY (EGD) WITH PROPOFOL;  Surgeon: Robert Bellow, MD;  Location: [facility] ENDOSCOPY;  Service: Endoscopy;  Laterality: N/A;  . HERNIA REPAIR  11/05/14   ventral hernia  . INGUINAL HERNIA REPAIR Left   . PARTIAL COLECTOMY  1997   "diverticulitis; busted; Byrnett"  . RIGHT/LEFT HEART CATH AND CORONARY ANGIOGRAPHY Bilateral 12/17/2020   Procedure: RIGHT/LEFT HEART CATH AND CORONARY ANGIOGRAPHY;  Surgeon: Wellington Hampshire, MD;  Location: [facility] CV LAB;  Service: Cardiovascular;  Laterality: Bilateral;  . stress cardiolite negative  9/02   EF 60%  . TONSILLECTOMY AND ADENOIDECTOMY     child  . VENTRAL HERNIA REPAIR  11/07   Byrnett   Family History  Problem Relation Age of Onset  . Coronary artery disease Sister   . Heart failure Mother    Social History   Socioeconomic History  . Marital status: Married    Spouse name: Not on file  . Number of children: 3  . Years of education: Not on file  . Highest education level: Not on file  Occupational History    Employer: RETIRED  Tobacco Use  . Smoking status: Former Smoker    Packs/day: 3.00    Years: 30.00    Pack years: 90.00    Types: Cigarettes    Quit date: 1980    Years since quitting: 42.3  . Smokeless tobacco: Former Systems developer    Types: Chew  . Tobacco comment: 11/04/2013 "quit smoking in the 1980's or so; quit chewing before I quit smoking"  Vaping Use  . Vaping Use: Never used  Substance and Sexual Activity  .  Alcohol use: No    Comment: 11/04/2013 "quit driking in 1980's; was called a weekend alcoholic"  . Drug use: No  . Sexual activity: Never  Other Topics Concern  . Not on file  Social History Narrative   Still does lawn work.    No living will, Wife is Economist. (Reviewed 2015)   Social Determinants of Health   Financial Resource Strain: Low Risk   . Difficulty of Paying Living Expenses: Not hard at all  Food Insecurity: No Food Insecurity  . Worried About Charity fundraiser in the Last Year: Never true  . Ran Out of Food in the Last Year: Never true  Transportation Needs: No Transportation Needs  . Lack of Transportation (Medical): No  . Lack of Transportation (Non-Medical): No  Physical Activity: Inactive  . Days of Exercise per Week: 0 days  . Minutes of Exercise per Session: 0 min  Stress: No Stress  Concern Present  . Feeling of Stress : Only a little  Social Connections: Not on file    Tobacco Counseling Counseling given: Not Answered Comment: 11/04/2013 "quit smoking in the 1980's or so; quit chewing before I quit smoking"   Clinical Intake:  Pre-visit preparation completed: Yes  Pain : No/denies pain     Nutritional Risks: None Diabetes: Yes CBG done?: No Did pt. bring in CBG monitor from home?: No  How often do you need to have someone help you when you read instructions, pamphlets, or other written materials from your doctor or pharmacy?: 1 - Never  Diabetic: Yes Nutrition Risk Assessment:  Has the patient had any N/V/D within the last 2 months?  No  Does the patient have any non-healing wounds?  No  Has the patient had any unintentional weight loss or weight gain?  No   Diabetes:  Is the patient diabetic?  Yes  If diabetic, was a CBG obtained today?  No  Did the patient bring in their glucometer from home?  No  How often do you monitor your CBG's? Lab work.   Financial Strains and Diabetes Management:  Are you having any financial strains with the  device, your supplies or your medication? No .  Does the patient want to be seen by Chronic Care Management for management of their diabetes?  No  Would the patient like to be referred to a Nutritionist or for Diabetic Management?  No   Diabetic Exams:  Diabetic Eye Exam: Overdue for diabetic eye exam. Pt has been advised about the importance in completing this exam. Patient advised to call and schedule an eye exam. Diabetic Foot Exam: Overdue, Pt has been advised about the importance in completing this exam. Pt is scheduled for diabetic foot exam on 03/04/2021.   Interpreter Needed?: No  Information entered by :: CJohnson, LPN   Activities of Daily Living In your present state of health, do you have any difficulty performing the following activities: 02/14/2021 12/17/2020  Hearing? Y N  Vision? N N  Difficulty concentrating or making decisions? Y N  Comment states he has dementia -  Walking or climbing stairs? Y N  Dressing or bathing? Y N  Doing errands, shopping? Y -  Conservation officer, nature and eating ? N -  Using the Toilet? N -  In the past six months, have you accidently leaked urine? N -  Do you have problems with loss of bowel control? N -  Managing your Medications? N -  Managing your Finances? N -  Housekeeping or managing your Housekeeping? Y -  Some recent data might be hidden    Patient Care Team: [name], MD as PCP - General Byrnett, Forest Gleason, MD (General Surgery) Venia Carbon, MD as Referring Physician (Internal Medicine) Agapito Games as Consulting Physician (Optometry) Debbora Dus, [facility] as Pharmacist (Pharmacist)  Indicate any recent Medical Services you may have received from other than [facility] providers in the past year (date may be approximate).     Assessment:   This is a routine wellness examination for [facility].  Hearing/Vision screen  Hearing Screening   125Hz 250Hz 500Hz 1000Hz 2000Hz 3000Hz 4000Hz 6000Hz 8000Hz  Right ear:            Left ear:           Vision Screening Comments: Patient gets annual eye exams   Dietary issues and exercise activities discussed: Current Exercise Habits: The patient does not participate in regular exercise  at present, Exercise limited by: respiratory conditions(s)  Goals    . Follow up with Primary Care Provider     Starting 11/22/2018, I will continue to take medications as prescribed and to keep appointments as scheduled with PCP.     Marland Kitchen Patient Stated     02/14/2021, I will maintain and continue medications as prescribed.     . Pharmacy Care Plan     CARE PLAN ENTRY  Current Barriers:  . Chronic Disease Management support, education, and care coordination needs related to Hypertension and COPD   Hypertension . Pharmacist Clinical Goal(s): o Over the next 4 weeks, patient will work with PharmD and providers to achieve BP goal <140/90 mmHg . Current regimen:  o Hydrochlorothiazide 25 mg - 1 tablet daily . Interventions: o Reviewed home blood pressure log and average home BP was 141/87 mmHg with home BP goal < 135/85 mmHg. Due to variance in timing of day, would like patient to check for 7 days prior to next telephone visit.  . Patient self care activities - Over the next 4 weeks, patient will: o Monitor home blood pressure for 7 days before next visit. Check twice in the morning before breakfast and twice in the evening before bedtime with 2-5 minutes in between checks.   Additional Recommendations: . Avoid additional non-steroidal anti-inflammatories for pain (Goody's powder, Advil (ibuprofen), Aleve (naproxen), Excedrin products) due to increasing risks with daily meloxicam. Use Tylenol or topical products for additional pain relief.  . Recommend updating vaccines: Tetanus (Tdap) and 2-dose series of shingles vaccines (Shingrix) recommended; Both can be given at your local pharmacy. . Incorporate more lean meats, fruits, and vegetables into diet. . Consider trying a maintenance  inhaler for COPD in additional to your rescue inhaler (albuterol).   Please see past updates related to this goal by clicking on the "Past Updates" button in the selected goal       Depression Screen PHQ 2/9 Scores 02/14/2021 12/05/2019 11/22/2018 11/15/2017 09/29/2016 10/14/2015 06/22/2014  PHQ - 2 Score 0 0 0 0 0 0 0  PHQ- 9 Score 0 - 0 0 - - -    Fall Risk Fall Risk  02/14/2021 12/05/2019 11/22/2018 11/15/2017 09/29/2016  Falls in the past year? 0 0 0 No No  Number falls in past yr: 0 - - - -  Injury with Fall? 0 - - - -  Risk for fall due to : Medication side effect;Impaired balance/gait - - - -  Follow up Falls evaluation completed;Falls prevention discussed - - - -    FALL RISK PREVENTION PERTAINING TO THE HOME:  Any stairs in or around the home? Yes  If so, are there any without handrails? No  Home free of loose throw rugs in walkways, pet beds, electrical cords, etc? Yes  Adequate lighting in your home to reduce risk of falls? Yes   ASSISTIVE DEVICES UTILIZED TO PREVENT FALLS:  Life alert? No  Use of a cane, walker or w/c? No  Grab bars in the bathroom? No  Shower chair or bench in shower? No  Elevated toilet seat or a handicapped toilet? No   TIMED UP AND GO:  Was the test performed? No . Telephone visit    Cognitive Function: MMSE - Mini Mental State Exam 02/14/2021 11/22/2018 11/15/2017 09/29/2016  Not completed: Unable to complete - - -  Orientation to time - _0 Orientation to Place - _1 Registration - _2 Attention/  Calculation - 0 0 0  Recall - _0 Recall-comments - unable to recall 1 of 3 words unable to recall 1 of 3 words -  Language- name 2 objects - 0 0 0  Language- repeat - _1 Language- follow 3 step command - _2 Language- read & follow direction - 0 0 0  Write a sentence - 0 0 0  Copy design - 0 0 0  Total score - _3 Mini Cog  Mini-Cog screen was not completed. Patient states he has dementia. Maximum score is 22. A value of 0  denotes this part of the MMSE was not completed or the patient failed this part of the Mini-Cog screening.       Immunizations Immunization History  Administered Date(s) Administered  . Influenza Split 07/31/2011, 08/08/2012, 09/01/2013  . Influenza Whole 10/05/2004, 08/16/2007, 08/11/2008, 08/06/2009  . Influenza, High Dose Seasonal PF 07/27/2016, 08/09/2017, 08/01/2018, 07/25/2019, 07/09/2020  . Influenza,inj,Quad PF,6+ Mos 06/22/2014, 10/14/2015  . Influenza-Unspecified 08/30/2018  . Pneumococcal Conjugate-13 06/22/2014  . Pneumococcal Polysaccharide-23 04/14/2009  . Td 10/31/1997, 05/13/2010, 12/05/2019    TDAP status: Up to date  Flu Vaccine status: Up to date  Pneumococcal vaccine status: Up to date  Covid-19 vaccine status: Declined, Education has been provided regarding the importance of this vaccine but patient still declined. Advised may receive this vaccine at local pharmacy or Health Dept.or vaccine clinic. Aware to provide a copy of the vaccination record if obtained from local pharmacy or Health Dept. Verbalized acceptance and understanding.  Qualifies for Shingles Vaccine? Yes   Zostavax completed No   Shingrix Completed?: No.    Education has been provided regarding the importance of this vaccine. Patient has been advised to call insurance company to determine out of pocket expense if they have not yet received this vaccine. Advised may also receive vaccine at local pharmacy or Health Dept. Verbalized acceptance and understanding.  Screening Tests Health Maintenance  Topic Date Due  . OPHTHALMOLOGY EXAM  11/09/2020  . FOOT EXAM  12/04/2020  . HEMOGLOBIN A1C  01/29/2021  . COVID-19 Vaccine (1) 03/02/2021 (Originally 12/16/1945)  . INFLUENZA VACCINE  05/30/2021  . TETANUS/TDAP  12/04/2029  . PNA vac Low Risk Adult  Completed  . HPV VACCINES  Aged Out    Health Maintenance  Health Maintenance Due  Topic Date Due  . OPHTHALMOLOGY EXAM  11/09/2020  . FOOT  EXAM  12/04/2020  . HEMOGLOBIN A1C  01/29/2021    Colorectal cancer screening: No longer required.   Lung Cancer Screening: (Low Dose CT Chest recommended if Age 32-80 years, 30 pack-year currently smoking OR have quit w/in 15years.) does not qualify.  Additional Screening:  Hepatitis C Screening: does not qualify; Completed N/A  Vision Screening: Recommended annual ophthalmology exams for early detection of glaucoma and other disorders of the eye. Is the patient up to date with their annual eye exam?  Yes  Who is the provider or what is the name of the office in which the patient attends annual eye exams? Dr. Marvel Plan If pt is not established with a provider, would they like to be referred to a provider to establish care? No .   Dental Screening: Recommended annual dental exams for proper oral hygiene  Community Resource Referral / Chronic Care Management: CRR required this visit?  No   CCM required this visit?  No      Plan:     I have  personally reviewed and noted the following in the patient's chart:   . Medical and social history . Use of alcohol, tobacco or illicit drugs  . Current medications and supplements . Functional ability and status . Nutritional status . Physical activity . Advanced directives . List of other physicians . Hospitalizations, surgeries, and ER visits in previous 12 months . Vitals . Screenings to include cognitive, depression, and falls . Referrals and appointments  In addition, I have reviewed and discussed with patient certain preventive protocols, quality metrics, and best practice recommendations. A written personalized care plan for preventive services as well as general preventive health recommendations were provided to patient.   Due to this being a telephonic visit, the after visit summary with patients personalized plan was offered to patient via office or my-chart. Patient preferred to pick up at office at next visit or via  mychart.   Andrez Grime, LPN   [id number]

## 2021-02-14 NOTE — Patient Instructions (Signed)
Caleb Bryant , Thank you for taking time to come for your Medicare Wellness Visit. I appreciate your ongoing commitment to your health goals. Please review the following plan we discussed and let me know if I can assist you in the future.   Screening recommendations/referrals: Colonoscopy: no longer required  Recommended yearly ophthalmology/optometry visit for glaucoma screening and checkup Recommended yearly dental visit for hygiene and checkup  Vaccinations: Influenza vaccine: Up to date, completed 07/09/2020, due 05/2021 Pneumococcal vaccine: Completed series Tdap vaccine: Up to date, completed 12/05/2019, due 11/2029 Shingles vaccine: due, check with your insurance regarding coverage if interested    Covid-19: declined  Advanced directives: Advance directive discussed with you today. Even though you declined this today please call our office should you change your mind and we can give you the proper paperwork for you to fill out.   Conditions/risks identified: DM, HTN, hypercholesterolemia   Next appointment: Follow up in one year for your annual wellness visit.   Preventive Care 31 Years and Older, Male Preventive care refers to lifestyle choices and visits with your health care provider that can promote health and wellness. What does preventive care include?  A yearly physical exam. This is also called an annual well check.  Dental exams once or twice a year.  Routine eye exams. Ask your health care provider how often you should have your eyes checked.  Personal lifestyle choices, including:  Daily care of your teeth and gums.  Regular physical activity.  Eating a healthy diet.  Avoiding tobacco and drug use.  Limiting alcohol use.  Practicing safe sex.  Taking low doses of aspirin every day.  Taking vitamin and mineral supplements as recommended by your health care provider. What happens during an annual well check? The services and screenings done by your health  care provider during your annual well check will depend on your age, overall health, lifestyle risk factors, and family history of disease. Counseling  Your health care provider may ask you questions about your:  Alcohol use.  Tobacco use.  Drug use.  Emotional well-being.  Home and relationship well-being.  Sexual activity.  Eating habits.  History of falls.  Memory and ability to understand (cognition).  Work and work Statistician. Screening  You may have the following tests or measurements:  Height, weight, and BMI.  Blood pressure.  Lipid and cholesterol levels. These may be checked every 5 years, or more frequently if you are over 54 years old.  Skin check.  Lung cancer screening. You may have this screening every year starting at age 71 if you have a 30-pack-year history of smoking and currently smoke or have quit within the past 15 years.  Fecal occult blood test (FOBT) of the stool. You may have this test every year starting at age 63.  Flexible sigmoidoscopy or colonoscopy. You may have a sigmoidoscopy every 5 years or a colonoscopy every 10 years starting at age 29.  Prostate cancer screening. Recommendations will vary depending on your family history and other risks.  Hepatitis C blood test.  Hepatitis B blood test.  Sexually transmitted disease (STD) testing.  Diabetes screening. This is done by checking your blood sugar (glucose) after you have not eaten for a while (fasting). You may have this done every 1-3 years.  Abdominal aortic aneurysm (AAA) screening. You may need this if you are a current or former smoker.  Osteoporosis. You may be screened starting at age 78 if you are at high risk. Talk with  your health care provider about your test results, treatment options, and if necessary, the need for more tests. Vaccines  Your health care provider may recommend certain vaccines, such as:  Influenza vaccine. This is recommended every  year.  Tetanus, diphtheria, and acellular pertussis (Tdap, Td) vaccine. You may need a Td booster every 10 years.  Zoster vaccine. You may need this after age 4.  Pneumococcal 13-valent conjugate (PCV13) vaccine. One dose is recommended after age 94.  Pneumococcal polysaccharide (PPSV23) vaccine. One dose is recommended after age 1. Talk to your health care provider about which screenings and vaccines you need and how often you need them. This information is not intended to replace advice given to you by your health care provider. Make sure you discuss any questions you have with your health care provider. Document Released: 11/12/2015 Document Revised: 07/05/2016 Document Reviewed: 08/17/2015 Elsevier Interactive Patient Education  2017 [place] Prevention in the Home Falls can cause injuries. They can happen to people of all ages. There are many things you can do to make your home safe and to help prevent falls. What can I do on the outside of my home?  Regularly fix the edges of walkways and driveways and fix any cracks.  Remove anything that might make you trip as you walk through a door, such as a raised step or threshold.  Trim any bushes or trees on the path to your home.  Use bright outdoor lighting.  Clear any walking paths of anything that might make someone trip, such as rocks or tools.  Regularly check to see if handrails are loose or broken. Make sure that both sides of any steps have handrails.  Any raised decks and porches should have guardrails on the edges.  Have any leaves, snow, or ice cleared regularly.  Use sand or salt on walking paths during winter.  Clean up any spills in your garage right away. This includes oil or grease spills. What can I do in the bathroom?  Use night lights.  Install grab bars by the toilet and in the tub and shower. Do not use towel bars as grab bars.  Use non-skid mats or decals in the tub or shower.  If you  need to sit down in the shower, use a plastic, non-slip stool.  Keep the floor dry. Clean up any water that spills on the floor as soon as it happens.  Remove soap buildup in the tub or shower regularly.  Attach bath mats securely with double-sided non-slip rug tape.  Do not have throw rugs and other things on the floor that can make you trip. What can I do in the bedroom?  Use night lights.  Make sure that you have a light by your bed that is easy to reach.  Do not use any sheets or blankets that are too big for your bed. They should not hang down onto the floor.  Have a firm chair that has side arms. You can use this for support while you get dressed.  Do not have throw rugs and other things on the floor that can make you trip. What can I do in the kitchen?  Clean up any spills right away.  Avoid walking on wet floors.  Keep items that you use a lot in easy-to-reach places.  If you need to reach something above you, use a strong step stool that has a grab bar.  Keep electrical cords out of the way.  Do not  use floor polish or wax that makes floors slippery. If you must use wax, use non-skid floor wax.  Do not have throw rugs and other things on the floor that can make you trip. What can I do with my stairs?  Do not leave any items on the stairs.  Make sure that there are handrails on both sides of the stairs and use them. Fix handrails that are broken or loose. Make sure that handrails are as long as the stairways.  Check any carpeting to make sure that it is firmly attached to the stairs. Fix any carpet that is loose or worn.  Avoid having throw rugs at the top or bottom of the stairs. If you do have throw rugs, attach them to the floor with carpet tape.  Make sure that you have a light switch at the top of the stairs and the bottom of the stairs. If you do not have them, ask someone to add them for you. What else can I do to help prevent falls?  Wear shoes  that:  Do not have high heels.  Have rubber bottoms.  Are comfortable and fit you well.  Are closed at the toe. Do not wear sandals.  If you use a stepladder:  Make sure that it is fully opened. Do not climb a closed stepladder.  Make sure that both sides of the stepladder are locked into place.  Ask someone to hold it for you, if possible.  Clearly mark and make sure that you can see:  Any grab bars or handrails.  First and last steps.  Where the edge of each step is.  Use tools that help you move around (mobility aids) if they are needed. These include:  Canes.  Walkers.  Scooters.  Crutches.  Turn on the lights when you go into a dark area. Replace any light bulbs as soon as they burn out.  Set up your furniture so you have a clear path. Avoid moving your furniture around.  If any of your floors are uneven, fix them.  If there are any pets around you, be aware of where they are.  Review your medicines with your doctor. Some medicines can make you feel dizzy. This can increase your chance of falling. Ask your doctor what other things that you can do to help prevent falls. This information is not intended to replace advice given to you by your health care provider. Make sure you discuss any questions you have with your health care provider. Document Released: 08/12/2009 Document Revised: 03/23/2016 Document Reviewed: 11/20/2014 Elsevier Interactive Patient Education  2017 [employer].

## 2021-02-21 ENCOUNTER — Other Ambulatory Visit (HOSPITAL_COMMUNITY): Payer: Self-pay | Admitting: *Deleted

## 2021-02-21 MED ORDER — ENTRESTO 24-26 MG PO TABS: 1.0000 | ORAL_TABLET | Freq: Two times a day (BID) | ORAL | 3 refills | Status: DC

## 2021-02-22 ENCOUNTER — Telehealth: Payer: Self-pay | Admitting: Family Medicine

## 2021-02-22 DIAGNOSIS — E538 Deficiency of other specified B group vitamins: Secondary | ICD-10-CM

## 2021-02-22 DIAGNOSIS — E1151 Type 2 diabetes mellitus with diabetic peripheral angiopathy without gangrene: Secondary | ICD-10-CM

## 2021-02-22 NOTE — Telephone Encounter (Signed)
-----  Message from Cloyd Stagers, RT sent at 02/08/2021  1:45 PM EDT ----- Regarding: Lab Orders for Friday 4.29.2022 Please place lab orders for Friday 4.29.2022, office visit for physical on Friday 5.6.2022 Thank you, Dyke Maes RT(R)

## 2021-02-25 ENCOUNTER — Ambulatory Visit: Payer: PPO

## 2021-02-25 ENCOUNTER — Other Ambulatory Visit: Payer: Self-pay

## 2021-02-25 ENCOUNTER — Other Ambulatory Visit (INDEPENDENT_AMBULATORY_CARE_PROVIDER_SITE_OTHER): Payer: PPO

## 2021-02-25 DIAGNOSIS — E1151 Type 2 diabetes mellitus with diabetic peripheral angiopathy without gangrene: Secondary | ICD-10-CM | POA: Diagnosis not present

## 2021-02-25 DIAGNOSIS — E538 Deficiency of other specified B group vitamins: Secondary | ICD-10-CM | POA: Diagnosis not present

## 2021-02-25 LAB — LIPID PANEL
Cholesterol: 111 mg/dL (ref 0–200)
HDL: 51.4 mg/dL (ref >39.00)
LDL Cholesterol: 48 mg/dL (ref 0–99)
NonHDL: 59.24
Total CHOL/HDL Ratio: 2
Triglycerides: 55 mg/dL (ref 0.0–149.0)
VLDL: 11 mg/dL (ref 0.0–40.0)

## 2021-02-25 LAB — COMPREHENSIVE METABOLIC PANEL
ALT: 17 U/L (ref 0–53)
AST: 25 U/L (ref 0–37)
Albumin: 4.1 g/dL (ref 3.5–5.2)
Alkaline Phosphatase: 80 U/L (ref 39–117)
BUN: 23 mg/dL (ref 6–23)
CO2: 30 mEq/L (ref 19–32)
Calcium: 9.6 mg/dL (ref 8.4–10.5)
Chloride: 100 mEq/L (ref 96–112)
Creatinine, Ser: 1.48 mg/dL (ref 0.40–1.50)
GFR: 42.97 mL/min — ABNORMAL LOW (ref >60.00)
Glucose, Bld: 99 mg/dL (ref 70–99)
Potassium: 4.3 mEq/L (ref 3.5–5.1)
Sodium: 137 mEq/L (ref 135–145)
Total Bilirubin: 1 mg/dL (ref 0.2–1.2)
Total Protein: 7.2 g/dL (ref 6.0–8.3)

## 2021-02-25 LAB — VITAMIN B12: Vitamin B-12: 666 pg/mL (ref 211–911)

## 2021-02-25 LAB — HEMOGLOBIN A1C: Hgb A1c MFr Bld: 6.5 % (ref 4.6–6.5)

## 2021-02-25 NOTE — Progress Notes (Signed)
No critical labs need to be addressed urgently. We will discuss labs in detail at upcoming office visit.   

## 2021-02-26 DIAGNOSIS — U071 COVID-19: Secondary | ICD-10-CM | POA: Diagnosis not present

## 2021-02-26 DIAGNOSIS — G4733 Obstructive sleep apnea (adult) (pediatric): Secondary | ICD-10-CM | POA: Diagnosis not present

## 2021-02-28 DIAGNOSIS — G4733 Obstructive sleep apnea (adult) (pediatric): Secondary | ICD-10-CM | POA: Diagnosis not present

## 2021-02-28 DIAGNOSIS — U071 COVID-19: Secondary | ICD-10-CM | POA: Diagnosis not present

## 2021-03-01 ENCOUNTER — Encounter (HOSPITAL_COMMUNITY): Payer: PPO

## 2021-03-02 ENCOUNTER — Telehealth: Payer: Self-pay | Admitting: Cardiology

## 2021-03-02 NOTE — Telephone Encounter (Signed)
Spoke to patient's wife regarding scheduling a sleep compliance appointment. He does not want to schedule the appointment. She stated that the patient is unable to use the device because the pressure from the machine causes too much of a headache. He is also on oxygen all the time so she is unsure of how he would use the machine properly anyway.  They are following up on 05/09 with [facility] and will let them know that he has decided not to used the pap device.

## 2021-03-04 ENCOUNTER — Other Ambulatory Visit: Payer: Self-pay

## 2021-03-04 ENCOUNTER — Ambulatory Visit (INDEPENDENT_AMBULATORY_CARE_PROVIDER_SITE_OTHER): Payer: PPO | Admitting: Family Medicine

## 2021-03-04 ENCOUNTER — Encounter: Payer: Self-pay | Admitting: Family Medicine

## 2021-03-04 VITALS — BP 100/62 | HR 83 | Temp 96.9°F | Ht 64.0 in | Wt 157.8 lb

## 2021-03-04 DIAGNOSIS — I5022 Chronic systolic (congestive) heart failure: Secondary | ICD-10-CM | POA: Diagnosis not present

## 2021-03-04 DIAGNOSIS — J449 Chronic obstructive pulmonary disease, unspecified: Secondary | ICD-10-CM | POA: Diagnosis not present

## 2021-03-04 DIAGNOSIS — E7800 Pure hypercholesterolemia, unspecified: Secondary | ICD-10-CM | POA: Diagnosis not present

## 2021-03-04 DIAGNOSIS — E1122 Type 2 diabetes mellitus with diabetic chronic kidney disease: Secondary | ICD-10-CM

## 2021-03-04 DIAGNOSIS — E1351 Other specified diabetes mellitus with diabetic peripheral angiopathy without gangrene: Secondary | ICD-10-CM | POA: Diagnosis not present

## 2021-03-04 DIAGNOSIS — I10 Essential (primary) hypertension: Secondary | ICD-10-CM | POA: Diagnosis not present

## 2021-03-04 DIAGNOSIS — E1151 Type 2 diabetes mellitus with diabetic peripheral angiopathy without gangrene: Secondary | ICD-10-CM | POA: Diagnosis not present

## 2021-03-04 DIAGNOSIS — Z0000 Encounter for general adult medical examination without abnormal findings: Secondary | ICD-10-CM

## 2021-03-04 DIAGNOSIS — N183 Chronic kidney disease, stage 3 unspecified: Secondary | ICD-10-CM | POA: Diagnosis not present

## 2021-03-04 DIAGNOSIS — J84112 Idiopathic pulmonary fibrosis: Secondary | ICD-10-CM | POA: Diagnosis not present

## 2021-03-04 DIAGNOSIS — E1340 Other specified diabetes mellitus with diabetic neuropathy, unspecified: Secondary | ICD-10-CM

## 2021-03-04 MED ORDER — PREGABALIN 50 MG PO CAPS: ORAL_CAPSULE | ORAL | 1 refills | Status: DC

## 2021-03-04 NOTE — Patient Instructions (Addendum)
Consider repeat ABI to determine blood flow in feet...  Call if need order for repeat.  Start and titrate up Lyrica for foot pain.  Call if joint redness and swelling in toe/foot.  Can try  Atorvastatin  three days a week to see if body pain improves.

## 2021-03-04 NOTE — Progress Notes (Signed)
Patient ID: Caleb Deruiter Sr., male    DOB: 05/30/1934, 87 y.o.   MRN: [id number]  This visit was conducted in person.  BP 100/62 (BP Location: Left Arm, Patient Position: Sitting, Cuff Size: Normal)   Pulse 83   Temp (!) 96.9 F (36.1 C) (Temporal)   Ht _0  (1.626 m)   Wt 157 lb 12 oz (71.6 kg)   SpO2 90%   BMI 27.08 kg/m    CC:  Chief Complaint  Patient presents with   Annual Exam    Part 2    Subjective:   HPI: Caleb Thau Sr. is a 87 y.o. male presenting on 03/04/2021 for Annual Exam (Part 2)  The patient presents for complete physical and review of chronic health problems. He/She also has the following acute concerns today: none  The patient saw a LPN or RN for medicare wellness visit.  Prevention and wellness was reviewed in detail. Note reviewed and important notes copied below. PCP notes:   Health Maintenance: Eye exam- due Foot exam- due     Abnormal Screenings: none     Pulmonary HTN, HFrEF, [place]  Reviewed last cardiology note form 01/21/2021  In 1/15, patient was noted to be in atrial flutter.  Echo at the time showed EF 25-30% and cath showed minimal CAD.  He had atria fibrillation ablation in 2/15 and anticoagulation was later stopped.  Echo in 8/16 showed EF back to normal range. Echo was done in 2/22 due to worsening symptoms.  This showed EF 25-30% with moderate RV dysfunction.  Cardiac MRI (3/22): EF 28%, diffuse hypokinesis, severe RV dilation with akinetic mid to apical RV wall and RV EF 30%, D-shaped septum, nonspecific RV insertion site LGE.  At that appt lasix was increased to 40 mg daily... on potassium daily . K4.3 on 02/25/2021  Continued Entresto, spironolactone Started on Tyvaso for pulmonary hypertension  COPD, past asbestos and cotton exposure: Pulmonary fibrosis Followed by Dr. Fletcher Anon Last CT chest in 12/21 showed progressive fibrosis and honeycombing     OSA:  Could not tolerate CPAP  Hypertension:  Stable control  on current meds. BP Readings from Last 3 Encounters:  03/04/21 100/62  01/21/21 120/70  01/13/21 100/68  Using medication without problems or lightheadedness:  none Chest pain with exertion: none Edema:  impeoved Short of breath: yes.Marland Kitchen onoxygen Average home BPs: Other issues:  Diabetes:  Well controlled jardiance 10 mg daily. Lab Results  Component Value Date   HGBA1C 6.5 02/25/2021  Using medications without difficulties: Hypoglycemic episodes: Hyperglycemic episodes: Feet problems: associated neuropathy and PAD He has colder feet,  More pain in soles of feet, achy and burning... not when moving Blood Sugars averaging: eye exam within last year:   Relevant past medical, surgical, family and social history reviewed and updated as indicated. Interim medical history since our last visit reviewed. Allergies and medications reviewed and updated. Outpatient Medications Prior to Visit  Medication Sig Dispense Refill   albuterol (VENTOLIN HFA) 108 (90 Base) MCG/ACT inhaler Inhale 2 puffs into the lungs every 4 (four) hours as needed for shortness of breath or wheezing. 18 g 0   allopurinol (ZYLOPRIM) 100 MG tablet TAKE 1 TABLET(100 MG) BY MOUTH DAILY 90 tablet 0   aspirin 81 MG chewable tablet Chew 81 mg by mouth daily.     atorvastatin (LIPITOR) 20 MG tablet TAKE 1 TABLET BY MOUTH DAILY 90 tablet 0   Budeson-Glycopyrrol-Formoterol (BREZTRI AEROSPHERE) 160-9-4.8 MCG/ACT AERO Inhale 2 puffs  into the lungs in the morning and at bedtime.     Cyanocobalamin (B-12 PO) Take 1,000 mcg by mouth daily.      empagliflozin ([name]) 10 MG TABS tablet Take 1 tablet (10 mg total) by mouth daily before breakfast. 30 tablet 6   furosemide (LASIX) 40 MG tablet Take 1 tablet (40 mg total) by mouth daily. 30 tablet 3   metoprolol succinate (TOPROL XL) 25 MG 24 hr tablet Take 1 tablet (25 mg total) by mouth daily. 30 tablet 5   potassium chloride SA (KLOR-CON) 20 MEQ tablet Take 1 tablet (20 mEq total)  by mouth daily. 90 tablet 3   sacubitril-valsartan (ENTRESTO) 24-26 MG Take 1 tablet by mouth 2 (two) times daily. 60 tablet 3   spironolactone (ALDACTONE) 25 MG tablet Take 0.5 tablets (12.5 mg total) by mouth daily. 15 tablet 3   Treprostinil (TYVASO) 0.6 MG/ML SOLN Inhale 18 mcg into the lungs in the morning, at noon, in the evening, and at bedtime.     No facility-administered medications prior to visit.     Per HPI unless specifically indicated in ROS section below Review of Systems  All other systems reviewed and are negative. Objective:  BP 100/62 (BP Location: Left Arm, Patient Position: Sitting, Cuff Size: Normal)   Pulse 83   Temp (!) 96.9 F (36.1 C) (Temporal)   Ht _0  (1.626 m)   Wt 157 lb 12 oz (71.6 kg)   SpO2 90%   BMI 27.08 kg/m   Wt Readings from Last 3 Encounters:  03/04/21 157 lb 12 oz (71.6 kg)  01/21/21 170 lb 6.4 oz (77.3 kg)  01/13/21 170 lb 6.4 oz (77.3 kg)      Physical Exam Constitutional:      General: He is not in acute distress.    Appearance: Normal appearance. He is well-developed. He is obese. He is not ill-appearing or toxic-appearing.  HENT:     Head: Normocephalic and atraumatic.     Right Ear: Hearing, tympanic membrane, ear canal and external ear normal.     Left Ear: Hearing, tympanic membrane, ear canal and external ear normal.     Nose: Nose normal.     Mouth/Throat:     Pharynx: Uvula midline.  Eyes:     General: Lids are normal. Lids are everted, no foreign bodies appreciated.     Conjunctiva/sclera: Conjunctivae normal.     Pupils: Pupils are equal, round, and reactive to light.  Neck:     Thyroid: No thyroid mass or thyromegaly.     Vascular: No carotid bruit.     Trachea: Trachea and phonation normal.  Cardiovascular:     Rate and Rhythm: Normal rate and regular rhythm.     Pulses: Normal pulses.     Heart sounds: S1 normal and S2 normal. No murmur heard.   No gallop.  Pulmonary:     Breath sounds: Normal breath  sounds. No wheezing, rhonchi or rales.  Abdominal:     General: Bowel sounds are normal.     Palpations: Abdomen is soft.     Tenderness: There is no abdominal tenderness. There is no guarding or rebound.     Hernia: No hernia is present.  Musculoskeletal:     Cervical back: Normal range of motion and neck supple.  Lymphadenopathy:     Cervical: No cervical adenopathy.  Skin:    General: Skin is warm and dry.     Findings: No rash.  Neurological:  Mental Status: He is alert.     Cranial Nerves: No cranial nerve deficit.     Sensory: No sensory deficit.     Gait: Gait normal.     Deep Tendon Reflexes: Reflexes are normal and symmetric.  Psychiatric:        Speech: Speech normal.        Behavior: Behavior normal.        Judgment: Judgment normal.      Results for orders placed or performed in visit on 02/25/21  Vitamin B12  Result Value Ref Range   Vitamin B-12 666 211 - 911 pg/mL  Comprehensive metabolic panel  Result Value Ref Range   Sodium 137 135 - 145 mEq/L   Potassium 4.3 3.5 - 5.1 mEq/L   Chloride 100 96 - 112 mEq/L   CO2 30 19 - 32 mEq/L   Glucose, Bld 99 70 - 99 mg/dL   BUN 23 6 - 23 mg/dL   Creatinine, Ser 1.48 0.40 - 1.50 mg/dL   Total Bilirubin 1.0 0.2 - 1.2 mg/dL   Alkaline Phosphatase 80 39 - 117 U/L   AST 25 0 - 37 U/L   ALT 17 0 - 53 U/L   Total Protein 7.2 6.0 - 8.3 g/dL   Albumin 4.1 3.5 - 5.2 g/dL   GFR 42.97 (L) >60.00 mL/min   Calcium 9.6 8.4 - 10.5 mg/dL  Lipid panel  Result Value Ref Range   Cholesterol 111 0 - 200 mg/dL   Triglycerides 55.0 0.0 - 149.0 mg/dL   HDL 51.40 >39.00 mg/dL   VLDL 11.0 0.0 - 40.0 mg/dL   LDL Cholesterol 48 0 - 99 mg/dL   Total CHOL/HDL Ratio 2    NonHDL 59.24   Hemoglobin A1c  Result Value Ref Range   Hgb A1c MFr Bld 6.5 4.6 - 6.5 %    This visit occurred during the SARS-CoV-2 public health emergency.  Safety protocols were in place, including screening questions prior to the visit, additional usage of  staff PPE, and extensive cleaning of exam room while observing appropriate contact time as indicated for disinfecting solutions.   COVID 19 screen:  No recent travel or known exposure to COVID19 The patient denies respiratory symptoms of COVID 19 at this time. The importance of social distancing was discussed today.   Assessment and Plan The patient's preventative maintenance and recommended screening tests for an annual wellness exam were reviewed in full today. Brought up to date unless services declined.  Counselled on the importance of diet, exercise, and its role in overall health and mortality. The patient's FH and SH was reviewed, including their home life, tobacco status, and drug and alcohol status.    UPTODATE with vaccines  PSA not indicated due to age.  Colon Ca [place] not indicated given age, last 2013    Problem List Items Addressed This Visit     Chronic obstructive pulmonary disease ([place])    Followed by Pulmonary.       Chronic systolic CHF (congestive heart failure) ([place])    Followed by cardiology. On lasix 40 mg daily. Entresto, spironolactone Started on Tyvaso for pulmonary hypertension at last cardiology visit.        CKD stage 3 due to type 2 diabetes mellitus (HCC)   DM (diabetes mellitus) with peripheral vascular complication (HCC)    Associated with PVD  Stable, chronic.  Continue current medication.   Well controlled jardiance 10 mg daily.       Essential hypertension  Stable, chronic.  Continue current medication.          HYPERCHOLESTEROLEMIA    Trial of lower dose statin to see if body ache improves.       Idiopathic pulmonary fibrosis (HCC)    Followed by pulmonary. Last CT chest in 12/21 showed progressive fibrosis and honeycombing       Neuropathy due to secondary diabetes mellitus ([place])    Consider repeat ABI to determine blood flow in feet...  Call if need order for repeat.  Start and titrate up Lyrica for foot pain.   Call if joint redness and swelling in toe/foot.       Peripheral vascular disease due to secondary diabetes mellitus ([place])     Feet cold and some foot pain may be multifactorial.. will reassess  If PAD has progresses.       Other Visit Diagnoses     Routine general medical examination at a health care facility    -  Primary       Eliezer Lofts, MD

## 2021-03-04 NOTE — Assessment & Plan Note (Signed)
Feet cold and some foot pain may be multifactorial.. will reassess  If PAD has progresses.

## 2021-03-07 ENCOUNTER — Ambulatory Visit (HOSPITAL_COMMUNITY)
Admission: RE | Admit: 2021-03-07 | Discharge: 2021-03-07 | Disposition: A | Discharge status: Home / Self Care | Payer: PPO | Source: Ambulatory Visit | Attending: Cardiology | Admitting: Cardiology

## 2021-03-07 ENCOUNTER — Encounter (HOSPITAL_COMMUNITY): Payer: Self-pay

## 2021-03-07 ENCOUNTER — Other Ambulatory Visit: Payer: Self-pay

## 2021-03-07 VITALS — BP 99/53 | HR 79 | Wt 161.6 lb

## 2021-03-07 DIAGNOSIS — G4733 Obstructive sleep apnea (adult) (pediatric): Secondary | ICD-10-CM | POA: Insufficient documentation

## 2021-03-07 DIAGNOSIS — I5022 Chronic systolic (congestive) heart failure: Secondary | ICD-10-CM

## 2021-03-07 DIAGNOSIS — I2721 Secondary pulmonary arterial hypertension: Secondary | ICD-10-CM | POA: Insufficient documentation

## 2021-03-07 DIAGNOSIS — Z79899 Other long term (current) drug therapy: Secondary | ICD-10-CM | POA: Insufficient documentation

## 2021-03-07 DIAGNOSIS — Z7984 Long term (current) use of oral hypoglycemic drugs: Secondary | ICD-10-CM | POA: Diagnosis not present

## 2021-03-07 DIAGNOSIS — Z87891 Personal history of nicotine dependence: Secondary | ICD-10-CM | POA: Insufficient documentation

## 2021-03-07 DIAGNOSIS — I110 Hypertensive heart disease with heart failure: Secondary | ICD-10-CM | POA: Diagnosis not present

## 2021-03-07 DIAGNOSIS — I5082 Biventricular heart failure: Secondary | ICD-10-CM | POA: Diagnosis not present

## 2021-03-07 DIAGNOSIS — I272 Pulmonary hypertension, unspecified: Secondary | ICD-10-CM | POA: Diagnosis not present

## 2021-03-07 DIAGNOSIS — Z8616 Personal history of COVID-19: Secondary | ICD-10-CM | POA: Diagnosis not present

## 2021-03-07 DIAGNOSIS — Z8249 Family history of ischemic heart disease and other diseases of the circulatory system: Secondary | ICD-10-CM | POA: Diagnosis not present

## 2021-03-07 DIAGNOSIS — I4892 Unspecified atrial flutter: Secondary | ICD-10-CM | POA: Diagnosis not present

## 2021-03-07 DIAGNOSIS — Z7901 Long term (current) use of anticoagulants: Secondary | ICD-10-CM | POA: Diagnosis not present

## 2021-03-07 DIAGNOSIS — I2510 Atherosclerotic heart disease of native coronary artery without angina pectoris: Secondary | ICD-10-CM | POA: Diagnosis not present

## 2021-03-07 DIAGNOSIS — Z7982 Long term (current) use of aspirin: Secondary | ICD-10-CM | POA: Diagnosis not present

## 2021-03-07 DIAGNOSIS — I428 Other cardiomyopathies: Secondary | ICD-10-CM | POA: Diagnosis not present

## 2021-03-07 NOTE — Progress Notes (Signed)
6 minute walk:  Walked 600 feet (182 meters) HR range 88-109 O2 range 100-89%   O2 recovered at completion of ambulation. Pt denied dizziness, sob or chest pain.

## 2021-03-07 NOTE — Patient Instructions (Signed)
No medication changes today!  Your physician recommends that you schedule a follow-up appointment in: 8 weeks with Dr Aundra Dubin  Please call office at [phone] option [phone] if you have any questions or concerns.    At the [facility], you and your health needs are our priority. As part of our continuing mission to provide you with exceptional heart care, we have created designated Provider Care Teams. These Care Teams include your primary Cardiologist (physician) and Advanced Practice Providers (APPs- Physician Assistants and Nurse Practitioners) who all work together to provide you with the care you need, when you need it.   You may see any of the following providers on your designated Care Team at your next follow up: Marland Kitchen Dr Glori Bickers . Dr Loralie Champagne . Dr Vickki Muff . Darrick Grinder, NP . Lyda Jester, [address] . Audry Riles, PharmD   Please be sure to bring in all your medications bottles to every appointment.

## 2021-03-07 NOTE — Progress Notes (Signed)
PCP: [name], MD Cardiology: Dr. Fletcher Anon HF Cardiology: Dr. Aundra Dubin  87 y.o. with history of atrial flutter, pulmonary fibrosis, and chronic systolic CHF was referred by Dr. Fletcher Anon for evaluation of CHF and pulmonary hypertension.  Patient has had pulmonary fibrosis since 2013, thought to be related to working in a [employer], had exposure to asbestos.  Last CT chest in 12/21 showed progressive fibrosis and honeycombing.  In 1/15, patient was noted to be in atrial flutter.  Echo at the time showed EF 25-30% and cath showed minimal CAD.  He had atria fibrillation ablation in 2/15 and anticoagulation was later stopped.  Echo in 8/16 showed EF back to normal range.  In 10/21, he had COVID-19 PNA.  He feels like he has never recovered from this.  Prior to 10/21, he was short of breath after weed-eating for about 15 minutes.  After 10/21, progressive dyspnea and required home oxygen.    Echo was done in 2/22 due to worsening symptoms.  This showed EF 25-30% with moderate RV dysfunction.  RHC/LHC was then done in 2/22, showing mild luminal irregularities, elevated right heart filling pressure and severe pulmonary arterial hypertension with normal PCWP.  Cardiac MRI in 3/22 showed EF 28%, diffuse hypokinesis, severe RV dilation with akinetic mid to apical RV wall and RV EF 30%, D-shaped septum, nonspecific RV insertion site LGE.    Had f/u w/ Dr. Aundra Dubin 3/25 and volume status was elevated. Endorsed NYHA class III symptoms. Noted rare atypical CP. No orthopnea/PND.  Was still waiting on his CPAP device and Tyvaso. HF regimen was adjusted. Lasix was increased to 40 mg daily, Farxiga 10 mg added, spiro 12.5 mg added. Delene Loll was continued at 24-26 mg bid. V/Q scan was also ordered. This was completed 02/02/21 and showed no evidence of chronic PE. Pulmonary fibrosis still present.   He returns back for f/u. Here w/ wife. In wheel chair and using supp O2 at 3L/ min (usual baseline).  He got his CPAP but unable to  tolerate face mask. Not using at night. He is now on Tyvaso. Wilder Glade was switched to [place] due to insurance.   Since last visit, Wt is down 9 lb from 170 to 161 lb today. Feeling better. Wife notes that his exercise tolerance has improved, can walk further before having to stop to rest. BP soft, 34V systolic but no orthostatic symptoms.   6 min walk 03/07/21 182 meters   Labs (2/22): K 4, creatinine 1.23 => 1.18, hgb 14.2, BNP 1273 Labs (3/22): K 3.4, creatinine 1.28, BNP 1967  Labs (4/22): K 4.3, SCr 1.32 Labs (4/22): K 4.3, SCr 1.48    ECG: not performed   PMH: 1. Atrial flutter: S/p ablation 2/15.  2. Type 2 diabetes 3. HTN 4. Hyperlipidemia 5. Ascending aortic aneurysm: Small, 41 mm on 2/22 echo.  6. Pulmonary fibrosis: Exposure to environmental toxins including asbestos while working [employer].   Fibrosis was found in 2013.  - CT chest (12/21): Progressive ILD in UIP pattern with honeycombing.  - PFTs (2/22): FVC 66%, FEV1 77%, ratio 80%, TLC 47% => restrictive pattern.  7. COVID-19 infection 10/21.  8. COPD: Prior smoker.  9. Chronic systolic CHF: Echo in 4/25 with EF 25-30%, thought to be tachy-mediated CMP due to atrial flutter.   - Echo (8/16) with EF normal range.  - Echo (2/22) with EF 25-30%, global hypokinesis, moderately decreased RV systolic function (no atrial arrhythmia).  - RHC/LHC (2/22): Mild nonobstructive CAD; mean RA  17, PA 70/27 mean 44, mean PCWP 13, CI 2.13, PVR 8.12.  - Cardiac MRI (3/22): EF 28%, diffuse hypokinesis, severe RV dilation with akinetic mid to apical RV wall and RV EF 30%, D-shaped septum, nonspecific RV insertion site LGE. 10. Pulmonary hypertension: RHC (2/22) with mean RA 17, PA 70/27 mean 44, mean PCWP 13, CI 2.13, PVR 8.12. 11. OSA  SH: Retired Civil engineer, contracting, married, lives in [place].  Enjoys fishing.  Remote smoker, no ETOH.   Family History  Problem Relation Age of Onset  . Coronary artery disease Sister    . Heart failure Mother    ROS: All systems reviewed and negative except as per HPI.   Current Outpatient Medications  Medication Sig Dispense Refill  . albuterol (VENTOLIN HFA) 108 (90 Base) MCG/ACT inhaler Inhale 2 puffs into the lungs every 4 (four) hours as needed for shortness of breath or wheezing. 18 g 0  . allopurinol (ZYLOPRIM) 100 MG tablet TAKE 1 TABLET(100 MG) BY MOUTH DAILY 90 tablet 0  . aspirin 81 MG chewable tablet Chew 81 mg by mouth daily.    Marland Kitchen atorvastatin (LIPITOR) 20 MG tablet TAKE 1 TABLET BY MOUTH DAILY 90 tablet 0  . Budeson-Glycopyrrol-Formoterol (BREZTRI AEROSPHERE) 160-9-4.8 MCG/ACT AERO Inhale 2 puffs into the lungs in the morning and at bedtime.    . Cyanocobalamin (B-12 PO) Take 1,000 mcg by mouth daily.     . empagliflozin ([name]) 10 MG TABS tablet Take 1 tablet (10 mg total) by mouth daily before breakfast. 30 tablet 6  . furosemide (LASIX) 40 MG tablet Take 1 tablet (40 mg total) by mouth daily. 30 tablet 3  . metoprolol succinate (TOPROL XL) 25 MG 24 hr tablet Take 1 tablet (25 mg total) by mouth daily. 30 tablet 5  . potassium chloride SA (KLOR-CON) 20 MEQ tablet Take 1 tablet (20 mEq total) by mouth daily. 90 tablet 3  . pregabalin (LYRICA) 50 MG capsule 1 tablet at night, if toleraing but still pain increase  50  mg weekly until effect and max 150 mg at night. 90 capsule 1  . sacubitril-valsartan (ENTRESTO) 24-26 MG Take 1 tablet by mouth 2 (two) times daily. 60 tablet 3  . spironolactone (ALDACTONE) 25 MG tablet Take 0.5 tablets (12.5 mg total) by mouth daily. 15 tablet 3  . Treprostinil (TYVASO) 0.6 MG/ML SOLN Inhale 18 mcg into the lungs in the morning, at noon, in the evening, and at bedtime.     No current facility-administered medications for this encounter.   BP (!) 99/53   Pulse 79   Wt 73.3 kg (161 lb 9.6 oz)   SpO2 100% Comment: 3 L  BMI 27.74 kg/m  PHYSICAL EXAM: General:  Well appearing. No respiratory difficulty HEENT:  normal Neck: supple. no JVD. Carotids 2+ bilat; no bruits. No lymphadenopathy or thyromegaly appreciated. Cor: PMI nondisplaced. Regular rate & rhythm. No rubs, gallops or murmurs. Lungs: clear Abdomen: soft, nontender, nondistended. No hepatosplenomegaly. No bruits or masses. Good bowel sounds. Extremities: no cyanosis, clubbing, rash, edema Neuro: alert & oriented x 3, cranial nerves grossly intact. moves all 4 extremities w/o difficulty. Affect pleasant.  Assessment/Plan: 1. Chronic systolic CHF: Nonischemic cardiomyopathy, cath in 2/22 with nonobstructive CAD.  In past, he was thought to have tachy-mediated CMP.  However, he has not had any recent prolonged fibrillation or flutter, so this does not seem to be the culprit for the recurrent drop in EF. Echo in 2/22 showed biventricular failure with  LV EF 25-30% and moderate RV dysfunction (RVH on echo).  RHC in 2/22 showed elevated right-sided filling pressure and pulmonary arterial hypertension.  Cardiac MRI in 3/22 showed EF 28%, diffuse hypokinesis, severe RV dilation with akinetic mid to apical RV wall and RV EF 30%, D-shaped septum, nonspecific RV insertion site LGE.  RV failure most likely is cor pulmonale with pulmonary fibrosis and pulmonary hypertension, but regional wall motion abnormalities (apex and mid-wall RV akinesis) brings up concern for ARVC (though no evidence for fibrofatty RV infiltration on MRI).  Volume status improved, wt down 9 lb. Appears euvolemic. Functional status improving NYHA IIIb>>NYHA II-III (likely due to both CHF and pulmonary fibrosis). Reviewed labs obtained at PCP office last week. SCr/K stable w/ recent diuretic changes   - Continue Lasix 40 mg daily - Continue Entresto 24/26. BP too soft for titration  - Continue Toprol XL 25 mg daily.  - Continue [name] 10 mg daily. Insurance did not approve Farxiga  - Continue spironolactone 12.5 mg daily.  - Given age, would not pursue ICD.  Narrow QRS so not CRT  candidate.  2. 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 PVR 8.1 on 2/22 cath. Possible group 1 PH component.  PH is contributing to RV systolic dysfunction. V/Q scan 4/22 negative for chronic PEs but c/w pulmonary fibrosis  - C/w Tyvaso for PH-ILD - Has OSA. Currently not tolerating new CPAP face mask. Advised that he consult device company regarding new apparatus. May tolerate nasal prongs better.  3. Pulmonary fibrosis: Restrictive PFTs in 2/22.  CT chest in 12/21 with progressive pulmonary fibrosis and honeycombing.  COVID-19 lung involvement could have worsened pulmonary parenchymal disease.        - He follows with pulmonary, would he be a candidate for antifibrotic therapy?  - Continue home oxygen.  4. Atrial flutter: Noted in 1/15, had ablation in 2/15, no atrial arrhythmias documented since that time.     F/u w/ Dr. Aundra Dubin in 2 months  Lyda Jester, PA-C  03/07/2021

## 2021-03-09 DIAGNOSIS — J1282 Pneumonia due to coronavirus disease 2019: Secondary | ICD-10-CM | POA: Diagnosis not present

## 2021-03-09 DIAGNOSIS — R0902 Hypoxemia: Secondary | ICD-10-CM | POA: Diagnosis not present

## 2021-03-11 NOTE — Telephone Encounter (Signed)
Per wife, they are returning cpap device today. He is unable to use it.

## 2021-03-14 NOTE — Telephone Encounter (Signed)
Reached out to the patient and he states his wife already left to turn the unit in. He states he just can't wear it.

## 2021-03-16 NOTE — Telephone Encounter (Signed)
Caleb Bryant says she has already turned the machine in because he refused to wear the cpap.

## 2021-03-17 DIAGNOSIS — I272 Pulmonary hypertension, unspecified: Secondary | ICD-10-CM | POA: Diagnosis not present

## 2021-03-17 DIAGNOSIS — F3340 Major depressive disorder, recurrent, in remission, unspecified: Secondary | ICD-10-CM | POA: Diagnosis not present

## 2021-03-17 DIAGNOSIS — J449 Chronic obstructive pulmonary disease, unspecified: Secondary | ICD-10-CM | POA: Diagnosis not present

## 2021-03-17 DIAGNOSIS — I5022 Chronic systolic (congestive) heart failure: Secondary | ICD-10-CM | POA: Diagnosis not present

## 2021-03-17 DIAGNOSIS — Z7984 Long term (current) use of oral hypoglycemic drugs: Secondary | ICD-10-CM | POA: Diagnosis not present

## 2021-03-17 DIAGNOSIS — E785 Hyperlipidemia, unspecified: Secondary | ICD-10-CM | POA: Diagnosis not present

## 2021-03-17 DIAGNOSIS — E1151 Type 2 diabetes mellitus with diabetic peripheral angiopathy without gangrene: Secondary | ICD-10-CM | POA: Diagnosis not present

## 2021-03-17 DIAGNOSIS — J84112 Idiopathic pulmonary fibrosis: Secondary | ICD-10-CM | POA: Diagnosis not present

## 2021-03-17 DIAGNOSIS — M109 Gout, unspecified: Secondary | ICD-10-CM | POA: Diagnosis not present

## 2021-03-17 DIAGNOSIS — I130 Hypertensive heart and chronic kidney disease with heart failure and stage 1 through stage 4 chronic kidney disease, or unspecified chronic kidney disease: Secondary | ICD-10-CM | POA: Diagnosis not present

## 2021-03-17 DIAGNOSIS — N1831 Chronic kidney disease, stage 3a: Secondary | ICD-10-CM | POA: Diagnosis not present

## 2021-03-17 DIAGNOSIS — E1122 Type 2 diabetes mellitus with diabetic chronic kidney disease: Secondary | ICD-10-CM | POA: Diagnosis not present

## 2021-03-28 DIAGNOSIS — U071 COVID-19: Secondary | ICD-10-CM | POA: Diagnosis not present

## 2021-03-28 DIAGNOSIS — G4733 Obstructive sleep apnea (adult) (pediatric): Secondary | ICD-10-CM | POA: Diagnosis not present

## 2021-03-31 ENCOUNTER — Telehealth (HOSPITAL_COMMUNITY): Payer: Self-pay | Admitting: Pharmacy Technician

## 2021-03-31 NOTE — Telephone Encounter (Signed)
Advanced Heart Failure Patient Advocate Encounter  Received a call from Ardine Bjork (RN) with [facility] that the patient is complaining about headaches jaw pain. Said that the patient is having occasional chest pressure when doing activities, this has apparently been going on for a few months. He recommended the the titrate down one breath to 7 breaths to see if that helps. Otherwise feels like he can do more activity and breathing better.   Ronalee Belts will follow up with the patient later this week to ensure that has been helpful.   Charlann Boxer, CPhT

## 2021-04-09 DIAGNOSIS — R0902 Hypoxemia: Secondary | ICD-10-CM | POA: Diagnosis not present

## 2021-04-09 DIAGNOSIS — J1282 Pneumonia due to coronavirus disease 2019: Secondary | ICD-10-CM | POA: Diagnosis not present

## 2021-04-10 ENCOUNTER — Other Ambulatory Visit: Payer: Self-pay | Admitting: Family Medicine

## 2021-04-18 ENCOUNTER — Other Ambulatory Visit (HOSPITAL_COMMUNITY): Payer: Self-pay

## 2021-04-18 ENCOUNTER — Ambulatory Visit: Payer: PPO | Admitting: Pulmonary Disease

## 2021-04-18 ENCOUNTER — Telehealth: Payer: Self-pay

## 2021-04-18 ENCOUNTER — Encounter: Payer: Self-pay | Admitting: Pulmonary Disease

## 2021-04-18 ENCOUNTER — Other Ambulatory Visit: Payer: Self-pay

## 2021-04-18 VITALS — BP 110/62 | HR 68 | Temp 97.5°F | Ht 64.0 in | Wt 165.0 lb

## 2021-04-18 DIAGNOSIS — I272 Pulmonary hypertension, unspecified: Secondary | ICD-10-CM | POA: Diagnosis not present

## 2021-04-18 DIAGNOSIS — J8410 Pulmonary fibrosis, unspecified: Secondary | ICD-10-CM | POA: Diagnosis not present

## 2021-04-18 DIAGNOSIS — I5022 Chronic systolic (congestive) heart failure: Secondary | ICD-10-CM | POA: Diagnosis not present

## 2021-04-18 DIAGNOSIS — J9611 Chronic respiratory failure with hypoxia: Secondary | ICD-10-CM

## 2021-04-18 DIAGNOSIS — J449 Chronic obstructive pulmonary disease, unspecified: Secondary | ICD-10-CM

## 2021-04-18 MED ORDER — BREZTRI AEROSPHERE 160-9-4.8 MCG/ACT IN AERO: 2.0000 | INHALATION_SPRAY | Freq: Two times a day (BID) | RESPIRATORY_TRACT | 11 refills | Status: DC

## 2021-04-18 MED ORDER — BREZTRI AEROSPHERE 160-9-4.8 MCG/ACT IN AERO: 2.0000 | INHALATION_SPRAY | Freq: Two times a day (BID) | RESPIRATORY_TRACT | 0 refills | Status: DC

## 2021-04-18 NOTE — Patient Instructions (Signed)
Continue taking Breztri  Continue Tyvaso  Continue N-acetylcysteine (NAC) supplement  We have sent a referral for evaluation for portable oxygen concentrator  We will see him in follow-up in 3 months time call sooner should any new problems arise

## 2021-04-18 NOTE — Chronic Care Management (AMB) (Addendum)
Chronic Care Management Pharmacy Assistant   Name: Caleb Bryant [facility]  MRN: [id number] DOB: May 23, 1934   Reason for Encounter: Disease State CHF    Recent office visits:  03/04/21 - PCP - Start and titrate up Lyrica for foot pain. Can try reducing atorvastatin to three days a week to see if body pain improves. 10/05/20 - PCP - Referral to pulmonology 08/13/20 - PCP - Continue albuterol BID and incentive spirometry. We will set up oxygen to use when ambulating.  Recent consult visits:  01/21/21 - Cardiology - Start Farxiga 42m 1 tablet daily and spironolactone 12.5 take 1/2 tablet daily, increase furosemide to 482m1 tablet daily  01/13/21 - Pulmonology - Start N-acetylcysteine (NAC) 600 mg twice a day with food 01/06/21 - Cardiology - No medication changes 12/21/20 - Cardiology - Stop losartan, start Entresto 24-26 mg take 1 tablet 2 times daily and Lasix 2064make 1 tablet daily  12/09/20 - Cardiology - Stop HCTZ, start Losartan 34m62mke 1 tablet daily, start Metoprolol 34mg75me 1 tablet daily     Hospital [facility]:  [facility]  [facility] - Acute Respiratory failure   Medications: Outpatient Encounter Medications as of 04/18/2021  Medication Sig   albuterol (VENTOLIN HFA) 108 (90 Base) MCG/ACT inhaler Inhale 2 puffs into the lungs every 4 (four) hours as needed for shortness of breath or wheezing.   allopurinol (ZYLOPRIM) 100 MG tablet TAKE 1 TABLET(100 MG) BY MOUTH DAILY   aspirin 81 MG chewable tablet Chew 81 mg by mouth daily.   atorvastatin (LIPITOR) 20 MG tablet TAKE 1 TABLET BY MOUTH DAILY   Budeson-Glycopyrrol-Formoterol (BREZTRI AEROSPHERE) 160-9-4.8 MCG/ACT AERO Inhale 2 puffs into the lungs in the morning and at bedtime.   Budeson-Glycopyrrol-Formoterol (BREZTRI AEROSPHERE) 160-9-4.8 MCG/ACT AERO Inhale 2 puffs into the lungs in the morning and at bedtime.   Cyanocobalamin (B-12 PO) Take 1,000 mcg by mouth daily.     empagliflozin ([name]) 10 MG TABS tablet Take 1 tablet (10 mg total) by mouth daily before breakfast.   furosemide (LASIX) 40 MG tablet Take 1 tablet (40 mg total) by mouth daily.   metoprolol succinate (TOPROL XL) 25 MG 24 hr tablet Take 1 tablet (25 mg total) by mouth daily.   potassium chloride SA (KLOR-CON) 20 MEQ tablet Take 1 tablet (20 mEq total) by mouth daily.   pregabalin (LYRICA) 50 MG capsule 1 tablet at night, if toleraing but still pain increase  50  mg weekly until effect and max 150 mg at night.   sacubitril-valsartan (ENTRESTO) 24-26 MG Take 1 tablet by mouth 2 (two) times daily.   spironolactone (ALDACTONE) 25 MG tablet Take 0.5 tablets (12.5 mg total) by mouth daily.   Treprostinil (TYVASO) 0.6 MG/ML SOLN Inhale 18 mcg into the lungs in the morning, at noon, in the evening, and at bedtime.   No facility-administered encounter medications on file as of 04/18/2021.    Chart reviewed. No adherence concerns. Attempted contact with Caleb Bryant Sr. to review CHF 3 times on 04/21/21, 04/26/21, 04/28/21. Unsuccessful outreach. Will attempt contact next month.   Star Rating Drugs:  Medication:  Last Fill: Day Supply Entresto 24-26mg 28m4/22 90 [name] 10mg 662m22 30 Atorvastatin 20 mg 01/10/21 90  [place]  Caleb Bryant [facility] [phone] reviewed the care management and care coordination activities outlined in this encounter and I am certifying that I agree with the  content of this note. No further action required.  Debbora Dus, PharmD Clinical Pharmacist [facility] [phone]

## 2021-04-18 NOTE — Progress Notes (Addendum)
Subjective:    Patient ID: Caleb Sauceda Sr., male    DOB: [birth year], 88 y.o.   MRN: [id number] Chief Complaint  Patient presents with   Follow-up    Sob with exertion.    HPI Patient is a very complex 87 year old former smoker (quit 1980) who presents for follow-up on the issue of shortness of breath and chronic respiratory failure with hypoxia.  This is a scheduled visit.  He has interstitial lung disease/pulmonary fibrosis with concomitant COPD as well.  He has had issues with chronic cough worse since his COVID-19 diagnosis in October 2021.  He has noted  improvement of his cough with Breztri inhaler which was provided for him during his initial visit here on January 2022.  He had a 2 D echo on 30 November 2020 which revealed an EF of 25 to 30%, global hypokinesis, DD grade I, mild to moderate tricuspid valve regurgitation and evidence suggestive of pulmonary hypertension.  He was referred back to cardiology (Dr. Fletcher Anon) for potential right heart cath.  He underwent right heart cath which showed severe pulmonary hypertension with mildly reduced cardiac output.  Since then he has been seen at the advanced heart failure clinic at [facility] in [place].  He was placed on Tyvaso and has noticed improvement on his dyspnea with this.  He is also on Entresto and Lasix.  He has noted some hair loss since he started on the Tyvaso which is rare side effect.  DATA: 01/25/2016 spirometry: FEV1 1.7 L or 62% predicted, FVC 2.4 L or 64% predicted.  FEV1/FVC 73%.  FEF 25-75% 1.4 L or 56% predicted.  Suspect combined restrictive/obstructive physiology. 01/28/2016 CT chest: Nonspecific interstitial lung disease present with peripheral distribution. 10/13/2020 CT chest: Marked interval progression of underlying interstitial lung disease demonstrating peripheral and basilar predominant architectural distortion, honeycombing and subpleural reticulation.  Basilar groundglass pulmonary infiltrates, bullous  emphysema, findings consistent with UIP plus emphysema 11/16/2020 CTD work-up: RF 14.7,  ANA negative, RP 0.6, ESR 3 11/30/2020 2D echo: LVEF 25 to 30%, global hypokinesis of the LV, DD grade I, RV systolic function moderately reduced, right ventricular size moderately enlarged, dilated pulmonary artery 12/03/2020 PFTs: FEV1 1.62 L or 77% predicted, FVC 2.02 L or 66% predicted, FEV1/FVC 80%, no bronchodilator response, lung volumes moderately to severely reduced, FEF 25-75 % 64%.  Diffusion capacity moderately to severely reduced at 55% 12/17/2020 right and left heart cath: ost RCA to proximal axillary lesion is 20% stenosed, proximal RCA lesion is 30% stenosis, mid RCA lesion 20% stenosed. RA: 15/22 with a mean of 17 mmHg RV: 70 over 9 mmHg PCW: 13 mmHg PA: 70/27 with a mean of 44 mmHg LVEDP: 13 mmHg. PA sat was 65.7% and aortic sat was 96.5%. Cardiac output was 3.82 with a cardiac index of 2.13. Pulmonary vascular resistance was 8.12 [place] units 12/21/2020 BNP: 1,273 01/18/2021 cardiac MRI: EF of 28% diffuse hypokinesis, severe RV dilatation with a connective mid to apical RV wall RV EF of 30% D-shaped septum suggestive of RV pressure/volume overload with a EF of 20%.  No RV thrombus.  Consistent with biventricular failure though RV looks worse. 02/02/2021 pulmonary perfusion: No acute or chronic blood clot.  Review of Systems A 10 point review of systems was performed and it is as noted above otherwise negative.  Patient Active Problem List   Diagnosis Date Noted   Acute systolic heart failure ([place])    Hypoxia 08/20/2020   CKD (chronic kidney disease), stage II  Acute respiratory failure with hypoxia (HCC)    Stage 3a chronic kidney disease ([place])    Hypokalemia    Type 2 diabetes mellitus with hyperlipidemia ([place])    Pneumonia due to COVID-19 virus 07/30/2020   GERD (gastroesophageal reflux disease) 10/17/2016   CKD stage 3 due to type 2 diabetes mellitus ([place]) 10/04/2016    Idiopathic pulmonary fibrosis ([place]) 02/25/2016   Thoracic aortic aneurysm without rupture (HCC) 02/01/2016   Chronic cough 01/25/2016   Chronic obstructive pulmonary disease ([place]) 01/25/2016   Midline thoracic back pain 10/19/2015   Advanced directives, counseling/discussion 10/14/2015   History of atrial flutter 12/21/2014   Ventral hernia without obstruction or gangrene 12/10/1550   Chronic systolic CHF (congestive heart failure) ([place]) 12/23/2013   Intermediate coronary syndrome ([place]) 11/04/2013   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   DDD (degenerative disc disease), cervical 03/31/2013   Fuchs' corneal dystrophy 02/19/2013   Neuropathy due to secondary diabetes mellitus ([place]) 12/31/2012   Gout, chronic, 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   DM (diabetes mellitus) with peripheral vascular complication ([place]) 06/01/2335   Shoulder pain, bilateral 11/06/2011   Vitamin B12 deficiency 03/11/2009   INTERMITTENT VERTIGO 03/11/2009   Mild dementia ([place]) 09/01/2008   HYPERCHOLESTEROLEMIA 11/29/2007   ALLERGIC RHINITIS 11/29/2007   Essential hypertension 11/14/2007   OSTEOARTHRITIS 06/13/2007   Peripheral vascular disease due to secondary diabetes mellitus ([place]) 06/12/2007   DIVERTICULOSIS, COLON 06/12/2007   BENIGN PROSTATIC HYPERTROPHY 06/12/2007   Social History   Tobacco Use   Smoking status: Former    Packs/day: 3.00    Years: 30.00    Pack years: 90.00    Types: Cigarettes    Quit date: 1980    Years since quitting: 42.4   Smokeless tobacco: Former    Types: Chew   Tobacco comments:    11/04/2013 "quit smoking in the 1980's or so; quit chewing before I quit smoking"  Substance Use Topics   Alcohol use: No    Comment: 11/04/2013 "quit driking in 1980's; was called a weekend alcoholic"   Allergies  Allergen Reactions   Codeine Nausea And Vomiting   Current Meds  Medication Sig   albuterol (VENTOLIN HFA) 108 (90 Base) MCG/ACT inhaler Inhale 2 puffs into the lungs every 4 (four) hours as  needed for shortness of breath or wheezing.   allopurinol (ZYLOPRIM) 100 MG tablet TAKE 1 TABLET(100 MG) BY MOUTH DAILY   aspirin 81 MG chewable tablet Chew 81 mg by mouth daily.   atorvastatin (LIPITOR) 20 MG tablet TAKE 1 TABLET BY MOUTH DAILY   Budeson-Glycopyrrol-Formoterol (BREZTRI AEROSPHERE) 160-9-4.8 MCG/ACT AERO Inhale 2 puffs into the lungs in the morning and at bedtime.   Cyanocobalamin (B-12 PO) Take 1,000 mcg by mouth daily.    empagliflozin ([name]) 10 MG TABS tablet Take 1 tablet (10 mg total) by mouth daily before breakfast.   furosemide (LASIX) 40 MG tablet Take 1 tablet (40 mg total) by mouth daily.   metoprolol succinate (TOPROL XL) 25 MG 24 hr tablet Take 1 tablet (25 mg total) by mouth daily.   potassium chloride SA (KLOR-CON) 20 MEQ tablet Take 1 tablet (20 mEq total) by mouth daily.   pregabalin (LYRICA) 50 MG capsule 1 tablet at night, if toleraing but still pain increase  50  mg weekly until effect and max 150 mg at night.   sacubitril-valsartan (ENTRESTO) 24-26 MG Take 1 tablet by mouth 2 (two) times daily.   spironolactone (ALDACTONE) 25 MG tablet Take 0.5 tablets (12.5 mg total) by mouth  daily.   Treprostinil (TYVASO) 0.6 MG/ML SOLN Inhale 18 mcg into the lungs in the morning, at noon, in the evening, and at bedtime.   Immunization History  Administered Date(s) Administered   Influenza Split 07/31/2011, 08/08/2012, 09/01/2013   Influenza Whole 10/05/2004, 08/16/2007, 08/11/2008, 08/06/2009   Influenza, High Dose Seasonal PF 07/27/2016, 08/09/2017, 08/01/2018, 07/25/2019, 07/09/2020   Influenza,inj,Quad PF,6+ Mos 06/22/2014, 10/14/2015   Influenza-Unspecified 08/30/2018   Pneumococcal Conjugate-13 06/22/2014   Pneumococcal Polysaccharide-23 04/14/2009   Td 10/31/1997, 05/13/2010, 12/05/2019        Objective:   Physical Exam BP 110/62 (BP Location: Left Arm, Cuff Size: Normal)   Pulse 68   Temp (!) 97.5 F (36.4 C) (Temporal)   Ht 5' 4" (1.626 m)   Wt  165 lb (74.8 kg)   SpO2 96%   BMI 28.32 kg/m   GENERAL: Well-developed well-nourished gentleman, no acute distress on 2 L nasal cannula O2.  No conversational dyspnea. HEAD: Normocephalic, atraumatic. EYES: Pupils equal, round, reactive to light.  No scleral icterus.  Corneal opacity right. MOUTH: Nose/mouth/throat not examined due to masking requirements for COVID 19. NECK: Supple. No thyromegaly. Trachea midline. No JVD.  No adenopathy. PULMONARY: Good air entry bilaterally.  Bilateral Velcro crackles at the bases, no adventitious sounds. CARDIOVASCULAR: S1 and S2. Regular rate and rhythm.  Grade 2/6 systolic ejection murmur at the aortic area.  No gallops or rubs noted. ABDOMEN: Benign. MUSCULOSKELETAL: Osteoarthritis changes both hands, no clubbing, no edema. NEUROLOGIC: No focal deficit, no gait disturbance, fluent speech  SKIN: Intact,warm,dry.  On limited exam no rashes. PSYCH: Mood and behavior normal.     Assessment & Plan:     ICD-10-CM   1. Pulmonary fibrosis (HCC)  J84.10    Likely multiple etiologies Prior occupational exposure Possible UIP/IPF Continue NAC Post COVID-19 postinflammatory fibrosis    2. COPD mixed type (HCC)  J44.9    Continue Breztri 2 puffs twice a day Continue as needed albuterol    3. Chronic respiratory failure with hypoxia (HCC)  J96.11 AMB REFERRAL FOR DME   Continue oxygen supplementation Referral for evaluation for POC    4. Chronic systolic CHF (congestive heart failure) (HCC)  I50.22    Managed by cardiology This issue adds complexity to his management    5. Pulmonary hypertension (HCC) - severe  I27.20    Multifactorial Associated with pulmonary fibrosis Chronic hypoxic vasoconstriction On Tyvaso     Meds ordered this encounter  Medications   Budeson-Glycopyrrol-Formoterol (BREZTRI AEROSPHERE) 160-9-4.8 MCG/ACT AERO    Sig: Inhale 2 puffs into the lungs in the morning and at bedtime.    Dispense:  10.7 g    Refill:  11    Budeson-Glycopyrrol-Formoterol (BREZTRI AEROSPHERE) 160-9-4.8 MCG/ACT AERO    Sig: Inhale 2 puffs into the lungs in the morning and at bedtime.    Dispense:  5.9 g    Refill:  0    Order Specific Question:   Lot Number?    Answer:   [id number]    Order Specific Question:   Expiration Date?    Answer:   09/30/2023    Order Specific Question:   Manufacturer?    Answer:   [employer] [[id number]]    Order Specific Question:   Quantity    Answer:   2   Orders Placed This Encounter  Procedures   AMB REFERRAL FOR DME    Referral Priority:   Routine    Referral Type:   Durable Medical Equipment Purchase  Number of Visits Requested:   1    Patient appears to be doing relatively well with his current regimen.  Continue medications as they are.  We will see him in follow-up in 3 months time he is to contact us prior to that time should any new difficulties arise.   Renold Don, MD [facility]   *This note was dictated using voice recognition software/Dragon.  Despite best efforts to proofread, errors can occur which can change the meaning.  Any change was purely unintentional.

## 2021-04-22 ENCOUNTER — Other Ambulatory Visit: Payer: Self-pay | Admitting: Family Medicine

## 2021-04-30 NOTE — Assessment & Plan Note (Signed)
Stable, chronic.  Continue current medication.

## 2021-04-30 NOTE — Assessment & Plan Note (Signed)
Followed by pulmonary. Last CT chest in 12/21 showed progressive fibrosis and honeycombing

## 2021-04-30 NOTE — Assessment & Plan Note (Signed)
Consider repeat ABI to determine blood flow in feet...  Call if need order for repeat.  Start and titrate up Lyrica for foot pain.  Call if joint redness and swelling in toe/foot.

## 2021-04-30 NOTE — Assessment & Plan Note (Signed)
Associated with PVD  Stable, chronic.  Continue current medication.   Well controlled jardiance 10 mg daily.

## 2021-04-30 NOTE — Assessment & Plan Note (Signed)
Followed by Pulmonary.

## 2021-04-30 NOTE — Assessment & Plan Note (Signed)
Trial of lower dose statin to see if body ache improves.

## 2021-04-30 NOTE — Assessment & Plan Note (Signed)
Followed by cardiology. On lasix 40 mg daily. Entresto, spironolactone Started on Tyvaso for pulmonary hypertension at last cardiology visit.

## 2021-05-06 DIAGNOSIS — F0390 Unspecified dementia without behavioral disturbance: Secondary | ICD-10-CM | POA: Diagnosis not present

## 2021-05-06 DIAGNOSIS — D692 Other nonthrombocytopenic purpura: Secondary | ICD-10-CM | POA: Diagnosis not present

## 2021-05-06 DIAGNOSIS — F3340 Major depressive disorder, recurrent, in remission, unspecified: Secondary | ICD-10-CM | POA: Diagnosis not present

## 2021-05-06 DIAGNOSIS — E1151 Type 2 diabetes mellitus with diabetic peripheral angiopathy without gangrene: Secondary | ICD-10-CM | POA: Diagnosis not present

## 2021-05-06 DIAGNOSIS — I272 Pulmonary hypertension, unspecified: Secondary | ICD-10-CM | POA: Diagnosis not present

## 2021-05-06 DIAGNOSIS — E785 Hyperlipidemia, unspecified: Secondary | ICD-10-CM | POA: Diagnosis not present

## 2021-05-06 DIAGNOSIS — I130 Hypertensive heart and chronic kidney disease with heart failure and stage 1 through stage 4 chronic kidney disease, or unspecified chronic kidney disease: Secondary | ICD-10-CM | POA: Diagnosis not present

## 2021-05-06 DIAGNOSIS — J84112 Idiopathic pulmonary fibrosis: Secondary | ICD-10-CM | POA: Diagnosis not present

## 2021-05-06 DIAGNOSIS — E1122 Type 2 diabetes mellitus with diabetic chronic kidney disease: Secondary | ICD-10-CM | POA: Diagnosis not present

## 2021-05-06 DIAGNOSIS — J449 Chronic obstructive pulmonary disease, unspecified: Secondary | ICD-10-CM | POA: Diagnosis not present

## 2021-05-06 DIAGNOSIS — N1831 Chronic kidney disease, stage 3a: Secondary | ICD-10-CM | POA: Diagnosis not present

## 2021-05-06 DIAGNOSIS — I5022 Chronic systolic (congestive) heart failure: Secondary | ICD-10-CM | POA: Diagnosis not present

## 2021-05-09 DIAGNOSIS — J1282 Pneumonia due to coronavirus disease 2019: Secondary | ICD-10-CM | POA: Diagnosis not present

## 2021-05-09 DIAGNOSIS — R0902 Hypoxemia: Secondary | ICD-10-CM | POA: Diagnosis not present

## 2021-05-11 ENCOUNTER — Other Ambulatory Visit: Payer: Self-pay

## 2021-05-11 ENCOUNTER — Encounter (HOSPITAL_COMMUNITY): Payer: Self-pay | Admitting: Cardiology

## 2021-05-11 ENCOUNTER — Ambulatory Visit (HOSPITAL_COMMUNITY)
Admission: RE | Admit: 2021-05-11 | Discharge: 2021-05-11 | Disposition: A | Discharge status: Home / Self Care | Payer: PPO | Source: Ambulatory Visit | Attending: Cardiology | Admitting: Cardiology

## 2021-05-11 VITALS — BP 88/54 | HR 74 | Ht 64.0 in | Wt 170.0 lb

## 2021-05-11 DIAGNOSIS — Z7982 Long term (current) use of aspirin: Secondary | ICD-10-CM | POA: Diagnosis not present

## 2021-05-11 DIAGNOSIS — I428 Other cardiomyopathies: Secondary | ICD-10-CM | POA: Insufficient documentation

## 2021-05-11 DIAGNOSIS — I4892 Unspecified atrial flutter: Secondary | ICD-10-CM | POA: Diagnosis not present

## 2021-05-11 DIAGNOSIS — I5022 Chronic systolic (congestive) heart failure: Secondary | ICD-10-CM | POA: Insufficient documentation

## 2021-05-11 DIAGNOSIS — Z79899 Other long term (current) drug therapy: Secondary | ICD-10-CM | POA: Insufficient documentation

## 2021-05-11 DIAGNOSIS — R059 Cough, unspecified: Secondary | ICD-10-CM | POA: Diagnosis not present

## 2021-05-11 DIAGNOSIS — I110 Hypertensive heart disease with heart failure: Secondary | ICD-10-CM | POA: Diagnosis not present

## 2021-05-11 DIAGNOSIS — Z8249 Family history of ischemic heart disease and other diseases of the circulatory system: Secondary | ICD-10-CM | POA: Diagnosis not present

## 2021-05-11 DIAGNOSIS — I272 Pulmonary hypertension, unspecified: Secondary | ICD-10-CM

## 2021-05-11 DIAGNOSIS — J8410 Pulmonary fibrosis, unspecified: Secondary | ICD-10-CM | POA: Insufficient documentation

## 2021-05-11 DIAGNOSIS — Z87891 Personal history of nicotine dependence: Secondary | ICD-10-CM | POA: Insufficient documentation

## 2021-05-11 DIAGNOSIS — Z8616 Personal history of COVID-19: Secondary | ICD-10-CM | POA: Diagnosis not present

## 2021-05-11 DIAGNOSIS — I2721 Secondary pulmonary arterial hypertension: Secondary | ICD-10-CM | POA: Diagnosis not present

## 2021-05-11 DIAGNOSIS — I2510 Atherosclerotic heart disease of native coronary artery without angina pectoris: Secondary | ICD-10-CM | POA: Diagnosis not present

## 2021-05-11 DIAGNOSIS — Z7984 Long term (current) use of oral hypoglycemic drugs: Secondary | ICD-10-CM | POA: Diagnosis not present

## 2021-05-11 LAB — BASIC METABOLIC PANEL
Anion gap: 7 (ref 5–15)
BUN: 23 mg/dL (ref 8–23)
CO2: 27 mmol/L (ref 22–32)
Calcium: 9.3 mg/dL (ref 8.9–10.3)
Chloride: 102 mmol/L (ref 98–111)
Creatinine, Ser: 1.46 mg/dL — ABNORMAL HIGH (ref 0.61–1.24)
GFR, Estimated: 47 mL/min — ABNORMAL LOW (ref >60)
Glucose, Bld: 110 mg/dL — ABNORMAL HIGH (ref 70–99)
Potassium: 4.5 mmol/L (ref 3.5–5.1)
Sodium: 136 mmol/L (ref 135–145)

## 2021-05-11 LAB — BRAIN NATRIURETIC PEPTIDE: B Natriuretic Peptide: 260.9 pg/mL — ABNORMAL HIGH (ref 0.0–100.0)

## 2021-05-11 NOTE — Patient Instructions (Addendum)
Take Spironolactone and Toprol in the evening.  We will work with [place] and your insurance to get your Tyvaso changed over to the new powder version  Lab work drawn today,we will call you with any abnormal results  Your physician recommends that you schedule a follow-up appointment in: 2 months  If you have any questions or concerns before your next appointment please send [place] a message through [place] or call our office at [phone].    TO LEAVE A MESSAGE FOR THE NURSE SELECT OPTION 2, PLEASE LEAVE A MESSAGE INCLUDING: YOUR NAME DATE OF BIRTH CALL BACK NUMBER REASON FOR CALL**this is important as we prioritize the call backs  YOU WILL RECEIVE A CALL BACK THE SAME DAY AS LONG AS YOU CALL BEFORE 4:00 PM  milAt the [facility], you and your health needs are our priority. As part of our continuing mission to provide you with exceptional heart care, we have created designated Provider Care Teams. These Care Teams include your primary Cardiologist (physician) and Advanced Practice Providers (APPs- Physician Assistants and Nurse Practitioners) who all work together to provide you with the care you need, when you need it.   You may see any of the following providers on your designated Care Team at your next follow up: Dr Glori Bickers Dr Loralie Champagne Dr Patrice Paradise, NP Lyda Jester, [place] Ginnie Smart Audry Riles, PharmD   Please be sure to bring in all your medications bottles to every appointment.

## 2021-05-11 NOTE — Progress Notes (Signed)
6 Min Walk Test Completed  Pt ambulated 91.30mters O2 Sat ranged 96%-79% on 2L oxygen HR ranged 88-120. Patient only completed 3 minutes,Dr. MAundra Dubinwas aware

## 2021-05-11 NOTE — Progress Notes (Signed)
PCP: [name], MD Cardiology: Dr. Fletcher Anon HF Cardiology: Dr. Aundra Dubin  87 y.o. with history of atrial flutter, pulmonary fibrosis, and chronic systolic CHF was referred by Dr. Fletcher Anon for evaluation of CHF and pulmonary hypertension.  Patient has had pulmonary fibrosis since 2013, thought to be related to working in a [employer], had exposure to asbestos.  Last CT chest in 12/21 showed progressive fibrosis and honeycombing.  In 1/15, patient was noted to be in atrial flutter.  Echo at the time showed EF 25-30% and cath showed minimal CAD.  He had atria fibrillation ablation in 2/15 and anticoagulation was later stopped.  Echo in 8/16 showed EF back to normal range.  In 10/21, he had COVID-19 PNA.  He feels like he has never recovered from this.  Prior to 10/21, he was short of breath after weed-eating for about 15 minutes.  After 10/21, progressive dyspnea and required home oxygen.    Echo was done in 2/22 due to worsening symptoms.  This showed EF 25-30% with moderate RV dysfunction.  RHC/LHC was then done in 2/22, showing mild luminal irregularities, elevated right heart filling pressure and severe pulmonary arterial hypertension with normal PCWP.  Cardiac MRI in 3/22 showed EF 28%, diffuse hypokinesis, severe RV dilation with akinetic mid to apical RV wall and RV EF 30%, D-shaped septum, nonspecific RV insertion site LGE.  V/Q scan in 4/22 showed no chronic PE.    Patient returns for followup of CHF.  He is now on Tyvaso, main complaint is coughing and difficulty transporting the Tyvaso inhaler with him.  He is using 3 L home oxygen.  He has not tolerate CPAP.  He has been more winded lately.  He is still able to walk into church and walk around the house without dyspnea, but gets short of breath with longer walks.  Very limited.  BP low today, SBP generally in the 90s-100s at home.  Not getting lightheaded.  No orthopnea/PND.  Weight up, but he has been eating better.     Labs (2/22): K 4, creatinine  1.23 => 1.18, hgb 14.2, BNP 1273 Labs (4/22): LDL 48, K 4.3, creatinine 1.48  ECG (personally reviewed): NSR, RVH, RBBB  6 minute walk (7/22): 91 m, oxygen saturation dropped to 79% with ambulation  PMH: 1. Atrial flutter: S/p ablation 2/15.  2. Type 2 diabetes 3. HTN 4. Hyperlipidemia 5. Ascending aortic aneurysm: Small, 41 mm on 2/22 echo.  6. Pulmonary fibrosis: Exposure to environmental toxins including asbestos while working [employer].   Fibrosis was found in 2013.  - CT chest (12/21): Progressive ILD in UIP pattern with honeycombing.  - PFTs (2/22): FVC 66%, FEV1 77%, ratio 80%, TLC 47% => restrictive pattern.  7. COVID-19 infection 10/21.  8. COPD: Prior smoker.  9. Chronic systolic CHF: Echo in 0/09 with EF 25-30%, thought to be tachy-mediated CMP due to atrial flutter.   - Echo (8/16) with EF normal range.  - Echo (2/22) with EF 25-30%, global hypokinesis, moderately decreased RV systolic function (no atrial arrhythmia).  - RHC/LHC (2/22): Mild nonobstructive CAD; mean RA 17, PA 70/27 mean 44, mean PCWP 13, CI 2.13, PVR 8.12.  - Cardiac MRI (3/22): EF 28%, diffuse hypokinesis, severe RV dilation with akinetic mid to apical RV wall and RV EF 30%, D-shaped septum, nonspecific RV insertion site LGE. 10. Pulmonary hypertension: RHC (2/22) with mean RA 17, PA 70/27 mean 44, mean PCWP 13, CI 2.13, PVR 8.12. - V/Q scan: No evidence for  chronic PE.  11. OSA  SH: Retired Civil engineer, contracting, married, lives in [place].  Enjoys fishing.  Remote smoker, no ETOH.   Family History  Problem Relation Age of Onset   Coronary artery disease Sister    Heart failure Mother    ROS: All systems reviewed and negative except as per HPI.   Current Outpatient Medications  Medication Sig Dispense Refill   albuterol (VENTOLIN HFA) 108 (90 Base) MCG/ACT inhaler Inhale 2 puffs into the lungs every 4 (four) hours as needed for shortness of breath or wheezing. 18 g 0   allopurinol  (ZYLOPRIM) 100 MG tablet TAKE 1 TABLET(100 MG) BY MOUTH DAILY 90 tablet 3   aspirin 81 MG chewable tablet Chew 81 mg by mouth daily.     atorvastatin (LIPITOR) 20 MG tablet TAKE 1 TABLET BY MOUTH DAILY 90 tablet 3   Budeson-Glycopyrrol-Formoterol (BREZTRI AEROSPHERE) 160-9-4.8 MCG/ACT AERO Inhale 2 puffs into the lungs in the morning and at bedtime. 10.7 g 11   Budeson-Glycopyrrol-Formoterol (BREZTRI AEROSPHERE) 160-9-4.8 MCG/ACT AERO Inhale 2 puffs into the lungs in the morning and at bedtime. 5.9 g 0   Cyanocobalamin (B-12 PO) Take 1,000 mcg by mouth daily.      empagliflozin ([name]) 10 MG TABS tablet Take 1 tablet (10 mg total) by mouth daily before breakfast. 30 tablet 6   furosemide (LASIX) 40 MG tablet Take 1 tablet (40 mg total) by mouth daily. 30 tablet 3   metoprolol succinate (TOPROL XL) 25 MG 24 hr tablet Take 1 tablet (25 mg total) by mouth daily. 30 tablet 5   potassium chloride SA (KLOR-CON) 20 MEQ tablet Take 1 tablet (20 mEq total) by mouth daily. 90 tablet 3   pregabalin (LYRICA) 50 MG capsule 1 tablet at night, if toleraing but still pain increase  50  mg weekly until effect and max 150 mg at night. 90 capsule 1   sacubitril-valsartan (ENTRESTO) 24-26 MG Take 1 tablet by mouth 2 (two) times daily. 60 tablet 3   spironolactone (ALDACTONE) 25 MG tablet Take 0.5 tablets (12.5 mg total) by mouth daily. 15 tablet 3   Treprostinil (TYVASO) 0.6 MG/ML SOLN Inhale 18 mcg into the lungs in the morning, at noon, in the evening, and at bedtime.     No current facility-administered medications for this encounter.   BP (!) 88/54   Pulse 74   Ht _0  (1.626 m)   Wt 77.1 kg (170 lb)   BMI 29.18 kg/m  General: NAD Neck: No JVD, no thyromegaly or thyroid nodule.  Lungs: Dry crackles at bases.  CV: Nondisplaced PMI.  Heart regular S1/S2, no S3/S4, no murmur.  No peripheral edema.  No carotid bruit.  Normal pedal pulses.  Abdomen: Soft, nontender, no hepatosplenomegaly, no distention.   Skin: Intact without lesions or rashes.  Neurologic: Alert and oriented x 3.  Psych: Normal affect. Extremities: No clubbing or cyanosis.  HEENT: Normal.   Assessment/Plan: 1. Chronic systolic CHF: Nonischemic cardiomyopathy, cath in 2/22 with nonobstructive CAD.  In past, he was thought to have tachy-mediated CMP.  However, he has not had any recent prolonged fibrillation or flutter, so this does not seem to be the culprit for the recurrent drop in EF. Echo in 2/22 showed biventricular failure with LV EF 25-30% and moderate RV dysfunction (RVH on echo).  RHC in 2/22 showed elevated right-sided filling pressure and pulmonary arterial hypertension.  Cardiac MRI in 3/22 showed EF 28%, diffuse hypokinesis, severe RV dilation with  akinetic mid to apical RV wall and RV EF 30%, D-shaped septum, nonspecific RV insertion site LGE.  RV failure most likely is cor pulmonale with pulmonary fibrosis and pulmonary hypertension, but regional wall motion abnormalities (apex and mid-wall RV akinesis) brings up concern for ARVC (though no evidence for fibrofatty RV infiltration on MRI).  Today, NYHA class III symptoms, but he is not volume overloaded on exam.  I suspect that the main issue here causing dyspnea is pulmonary fibrosis.  - Continue Lasix 40 mg daily. BMET/BNP today.  - Continue Entresto 24/26 bid, no BP room to increase.   - Continue Toprol XL 25 mg daily, take at night given soft BP.   - Continue [name] 10 mg daily.  - Continue spironolactone 12.5 mg daily (no BP room to increase), take at night given soft BP.  - Given age, would not pursue ICD.  Narrow QRS so not CRT candidate.  2. 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 PVR 8.1 on 2/22 cath. V/Q scan not suggestive of chronic PE. Possible group 1 PH component.  PH is contributing to RV systolic dysfunction. 6 minute walk shows significant limitation.  -  Continue Tyvaso, but will see if we can get the dry powder inhaler for him.    - Continue use CPAP.  3. Pulmonary fibrosis: Restrictive PFTs in 2/22.  CT chest in 12/21 with progressive pulmonary fibrosis and honeycombing.  COVID-19 lung involvement could have worsened pulmonary parenchymal disease.        - He follows with pulmonary, would he be a candidate for antifibrotic therapy? Will discuss with Dr. Chase Caller.  - Continue home oxygen, will try to get him a smaller more mobile tank.  4. Atrial flutter: Noted in 1/15, had ablation in 2/15, no atrial arrhythmias documented since that time.  He is in NSR today.  He is not anticoagulated.   Followup in 2 months.   Loralie Champagne 05/11/2021

## 2021-05-12 ENCOUNTER — Other Ambulatory Visit (HOSPITAL_COMMUNITY): Payer: Self-pay | Admitting: Cardiology

## 2021-05-12 NOTE — Addendum Note (Signed)
Encounter addended by: Scarlette Calico, RN on: 05/12/2021 4:31 PM  Actions taken: Clinical Note Signed

## 2021-05-12 NOTE — Progress Notes (Signed)
Pt expressed interest in obtaining an [employer] oxygen concentrator, advised pt he will need to contact [employer] at [phone] and discuss with them to see if he would qualify and if Medicare would cover, they will then contact us for order if needed, pt's wife agreeable and will contact them.

## 2021-05-16 ENCOUNTER — Other Ambulatory Visit (HOSPITAL_COMMUNITY): Payer: Self-pay | Admitting: Cardiology

## 2021-05-23 ENCOUNTER — Telehealth (HOSPITAL_COMMUNITY): Payer: Self-pay | Admitting: *Deleted

## 2021-05-23 NOTE — Telephone Encounter (Signed)
Left vm for pt or wife to return my call.

## 2021-05-23 NOTE — Telephone Encounter (Signed)
Spoke with pts wife she is aware and agreeable with plan. Appt scheduled.

## 2021-05-23 NOTE — Telephone Encounter (Signed)
Hold Lasix x 2 days and stop Entresto.  Needs followup in APP clinic this week to assess.

## 2021-05-23 NOTE — Telephone Encounter (Signed)
Pts wife called stating pt passed out twice Friday. Wife said he did not injure himself but his bp was 64/44. Pts wife said pts bp is consistently low and wants his medications adjusted.   Routed to [place]

## 2021-05-25 ENCOUNTER — Other Ambulatory Visit: Payer: Self-pay

## 2021-05-25 ENCOUNTER — Ambulatory Visit (HOSPITAL_COMMUNITY)
Admission: RE | Admit: 2021-05-25 | Discharge: 2021-05-25 | Disposition: A | Discharge status: Home / Self Care | Payer: PPO | Source: Ambulatory Visit | Attending: Family Medicine | Admitting: Family Medicine

## 2021-05-25 ENCOUNTER — Encounter (HOSPITAL_COMMUNITY): Payer: Self-pay

## 2021-05-25 VITALS — BP 102/70 | HR 60 | Ht 64.0 in | Wt 171.0 lb

## 2021-05-25 DIAGNOSIS — R55 Syncope and collapse: Secondary | ICD-10-CM | POA: Diagnosis not present

## 2021-05-25 DIAGNOSIS — Z8679 Personal history of other diseases of the circulatory system: Secondary | ICD-10-CM

## 2021-05-25 DIAGNOSIS — Z87891 Personal history of nicotine dependence: Secondary | ICD-10-CM | POA: Diagnosis not present

## 2021-05-25 DIAGNOSIS — I5022 Chronic systolic (congestive) heart failure: Secondary | ICD-10-CM | POA: Diagnosis not present

## 2021-05-25 DIAGNOSIS — Z79899 Other long term (current) drug therapy: Secondary | ICD-10-CM | POA: Insufficient documentation

## 2021-05-25 DIAGNOSIS — I428 Other cardiomyopathies: Secondary | ICD-10-CM | POA: Diagnosis not present

## 2021-05-25 DIAGNOSIS — I5082 Biventricular heart failure: Secondary | ICD-10-CM | POA: Diagnosis not present

## 2021-05-25 DIAGNOSIS — I2510 Atherosclerotic heart disease of native coronary artery without angina pectoris: Secondary | ICD-10-CM | POA: Insufficient documentation

## 2021-05-25 DIAGNOSIS — J8410 Pulmonary fibrosis, unspecified: Secondary | ICD-10-CM

## 2021-05-25 DIAGNOSIS — Z8616 Personal history of COVID-19: Secondary | ICD-10-CM | POA: Insufficient documentation

## 2021-05-25 DIAGNOSIS — Z8249 Family history of ischemic heart disease and other diseases of the circulatory system: Secondary | ICD-10-CM | POA: Diagnosis not present

## 2021-05-25 DIAGNOSIS — I272 Pulmonary hypertension, unspecified: Secondary | ICD-10-CM

## 2021-05-25 DIAGNOSIS — Z7901 Long term (current) use of anticoagulants: Secondary | ICD-10-CM | POA: Insufficient documentation

## 2021-05-25 DIAGNOSIS — Z7982 Long term (current) use of aspirin: Secondary | ICD-10-CM | POA: Insufficient documentation

## 2021-05-25 DIAGNOSIS — Z7984 Long term (current) use of oral hypoglycemic drugs: Secondary | ICD-10-CM | POA: Insufficient documentation

## 2021-05-25 DIAGNOSIS — I110 Hypertensive heart disease with heart failure: Secondary | ICD-10-CM | POA: Diagnosis not present

## 2021-05-25 LAB — BASIC METABOLIC PANEL
Anion gap: 6 (ref 5–15)
BUN: 24 mg/dL — ABNORMAL HIGH (ref 8–23)
CO2: 30 mmol/L (ref 22–32)
Calcium: 9.4 mg/dL (ref 8.9–10.3)
Chloride: 102 mmol/L (ref 98–111)
Creatinine, Ser: 1.5 mg/dL — ABNORMAL HIGH (ref 0.61–1.24)
GFR, Estimated: 45 mL/min — ABNORMAL LOW (ref >60)
Glucose, Bld: 105 mg/dL — ABNORMAL HIGH (ref 70–99)
Potassium: 4.5 mmol/L (ref 3.5–5.1)
Sodium: 138 mmol/L (ref 135–145)

## 2021-05-25 LAB — BRAIN NATRIURETIC PEPTIDE: B Natriuretic Peptide: 584.1 pg/mL — ABNORMAL HIGH (ref 0.0–100.0)

## 2021-05-25 LAB — MAGNESIUM: Magnesium: 2 mg/dL (ref 1.7–2.4)

## 2021-05-25 MED ORDER — FUROSEMIDE 40 MG PO TABS: ORAL_TABLET | ORAL | 11 refills | Status: DC

## 2021-05-25 NOTE — Progress Notes (Signed)
PCP: [name], MD Cardiology: Dr. Fletcher Anon HF Cardiology: Dr. Aundra Dubin  87 y.o. with history of atrial flutter, pulmonary fibrosis, and chronic systolic CHF was referred by Dr. Fletcher Anon for evaluation of CHF and pulmonary hypertension.  Patient has had pulmonary fibrosis since 2013, thought to be related to working in a [employer], had exposure to asbestos.  CT chest in 12/21 showed progressive fibrosis and honeycombing.  In 1/15, patient was noted to be in atrial flutter.  Echo at the time showed EF 25-30% and cath showed minimal CAD.  He had atrial fibrillation ablation in 2/15 and anticoagulation was later stopped.  Echo in 8/16 showed EF back to normal range.  In 10/21, he had COVID-19 PNA.  He feels like he has never recovered from this.  Prior to 10/21, he was short of breath after weed-eating for about 15 minutes.  After 10/21, progressive dyspnea and required home oxygen.    Echo was done in 2/22 due to worsening symptoms.  This showed EF 25-30% with moderate RV dysfunction.  RHC/LHC was then done in 2/22, showing mild luminal irregularities, elevated right heart filling pressure and severe pulmonary arterial hypertension with normal PCWP.  Cardiac MRI in 3/22 showed EF 28%, diffuse hypokinesis, severe RV dilation with akinetic mid to apical RV wall and RV EF 30%, D-shaped septum, nonspecific RV insertion site LGE.  V/Q scan in 4/22 showed no chronic PE.    Patient returned 7/13 for followup of CHF.  He is now on Tyvaso and using 3 L home oxygen.  He has not tolerated CPAP.  He has been more winded lately and very limited.  BP low, SBP generally in the 90s-100s at home.     Today he returns for acute visit for syncope. He passed out twice last Friday, BP was 64/44. He did not hit his head. He called our office, and Entresto was stopped and lasix held for 2 days.  Wife says he also had syncopal episode 03/29/21, and BP was 87'O systolic. He had been outside in the heat that day. He denies CP,  palpitations, edema, or PND/Orthopnea. Appetite ok, he trying to drink more fluid in the heat. No fever or chills. Weight at home 165 pounds. Taking all medications.   Labs (2/22): K 4, creatinine 1.23 => 1.18, hgb 14.2, BNP 1273 Labs (4/22): LDL 48, K 4.3, creatinine 1.48 Labs (7/22): K 4.2, creatinine 1.46  ECG (personally reviewed): SR with IVCD, qrs 116 ms, no significant change from previous tracing.  6 minute walk (7/22): 91 m, oxygen saturation dropped to 79% with ambulation  PMH: 1. Atrial flutter: S/p ablation 2/15.  2. Type 2 diabetes 3. HTN 4. Hyperlipidemia 5. Ascending aortic aneurysm: Small, 41 mm on 2/22 echo.  6. Pulmonary fibrosis: Exposure to environmental toxins including asbestos while working [employer].   Fibrosis was found in 2013.  - CT chest (12/21): Progressive ILD in UIP pattern with honeycombing.  - PFTs (2/22): FVC 66%, FEV1 77%, ratio 80%, TLC 47% => restrictive pattern.  7. COVID-19 infection 10/21.  8. COPD: Prior smoker.  9. 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   - Echo (8/16) with EF normal range.  - Echo (2/22) with EF 25-30%, global hypokinesis, moderately decreased RV systolic function (no atrial arrhythmia).  - RHC/LHC (2/22): Mild nonobstructive CAD; mean RA 17, PA 70/27 mean 44, mean PCWP 13, CI 2.13, PVR 8.12.  - Cardiac MRI (  3/22): EF 28%, diffuse hypokinesis, severe RV dilation with akinetic mid to apical RV wall and RV EF 30%, D-shaped septum, nonspecific RV insertion site LGE. 10. Pulmonary hypertension: RHC (2/22) with mean RA 17, PA 70/27 mean 44, mean PCWP 13, CI 2.13, PVR 8.12. - V/Q scan: No evidence for chronic PE.  11. OSA  SH: Retired Civil engineer, contracting, married, lives in [place].  Enjoys fishing.  Remote smoker, no ETOH.   Family History  Problem Relation Age of Onset   Coronary artery disease Sister    Heart failure Mother    ROS: All  systems reviewed and negative except as per HPI.   Current Outpatient Medications  Medication Sig Dispense Refill   albuterol (VENTOLIN HFA) 108 (90 Base) MCG/ACT inhaler Inhale 2 puffs into the lungs every 4 (four) hours as needed for shortness of breath or wheezing. 18 g 0   allopurinol (ZYLOPRIM) 100 MG tablet TAKE 1 TABLET(100 MG) BY MOUTH DAILY 90 tablet 3   aspirin 81 MG chewable tablet Chew 81 mg by mouth daily.     atorvastatin (LIPITOR) 20 MG tablet TAKE 1 TABLET BY MOUTH DAILY 90 tablet 3   Budeson-Glycopyrrol-Formoterol (BREZTRI AEROSPHERE) 160-9-4.8 MCG/ACT AERO Inhale 2 puffs into the lungs in the morning and at bedtime. 10.7 g 11   Budeson-Glycopyrrol-Formoterol (BREZTRI AEROSPHERE) 160-9-4.8 MCG/ACT AERO Inhale 2 puffs into the lungs in the morning and at bedtime. 5.9 g 0   Cyanocobalamin (B-12 PO) Take 1,000 mcg by mouth daily.      empagliflozin ([name]) 10 MG TABS tablet Take 1 tablet (10 mg total) by mouth daily before breakfast. 30 tablet 6   metoprolol succinate (TOPROL XL) 25 MG 24 hr tablet Take 1 tablet (25 mg total) by mouth daily. 30 tablet 5   potassium chloride SA (KLOR-CON) 20 MEQ tablet Take 1 tablet (20 mEq total) by mouth daily. 90 tablet 3   pregabalin (LYRICA) 50 MG capsule 1 tablet at night, if toleraing but still pain increase  50  mg weekly until effect and max 150 mg at night. 90 capsule 1   spironolactone (ALDACTONE) 25 MG tablet TAKE 1/2 TABLET(12.5 MG) BY MOUTH DAILY 15 tablet 11   Treprostinil (TYVASO) 0.6 MG/ML SOLN Inhale 18 mcg into the lungs in the morning, at noon, in the evening, and at bedtime.     furosemide (LASIX) 40 MG tablet TAKE 1 TABLET(40 MG) BY MOUTH DAILY (Patient not taking: Reported on 05/25/2021) 30 tablet 11   No current facility-administered medications for this encounter.   Wt Readings from Last 3 Encounters:  05/25/21 77.6 kg (171 lb)  05/11/21 77.1 kg (170 lb)  04/18/21 74.8 kg (165 lb)   BP 102/70   Pulse 60   Ht _0   (1.626 m)   Wt 77.6 kg (171 lb)   SpO2 98%   BMI 29.35 kg/m  ReDs Clip: 43% General:  NAD. No resp difficulty, arrived in [facility] HEENT: Normal Neck: Supple. JVP 7-8. Carotids 2+ bilat; no bruits. No lymphadenopathy or thryomegaly appreciated. Cor: PMI nondisplaced. Regular rate & rhythm. No rubs, gallops or murmurs. Lungs: Clear Abdomen: Soft, nontender, nondistended. No hepatosplenomegaly. No bruits or masses. Good bowel sounds. Extremities: No cyanosis, clubbing, rash, edema Neuro: Alert & oriented x 3, cranial nerves grossly intact. Moves all 4 extremities w/o difficulty. Affect pleasant.  Assessment/Plan: 1. Syncope: Likely from hypotension from medication regimen. He is not dehydrated on exam today.  Entresto stopped. - Decrease Toprol XL to 12.5  mg q hs. - Orthostatics negative today. - Will place Zio Patch 14 day to ensure no worrisome arrhythmias. - Check BMET, BNP, and Magnesium today. - I have asked him and his wife to check BP/HR daily and log.  2. Chronic systolic CHF: Nonischemic cardiomyopathy, cath in 2/22 with nonobstructive CAD.  In past, he was thought to have tachy-mediated CMP.  However, he has not had any recent prolonged fibrillation or flutter, so this does not seem to be the culprit for the recurrent drop in EF. Echo in 2/22 showed biventricular failure with LV EF 25-30% and moderate RV dysfunction (RVH on echo).  RHC in 2/22 showed elevated right-sided filling pressure and pulmonary arterial hypertension.  Cardiac MRI in 3/22 showed EF 28%, diffuse hypokinesis, severe RV dilation with akinetic mid to apical RV wall and RV EF 30%, D-shaped septum, nonspecific RV insertion site LGE.  RV failure most likely is cor pulmonale with pulmonary fibrosis and pulmonary hypertension, but regional wall motion abnormalities (apex and mid-wall RV akinesis) brings up concern for ARVC (though no evidence for fibrofatty RV infiltration on MRI).  Today, NYHA class III symptoms, he is volume  overloaded on exam, ReDs clip 43%.   - Increase Lasix to 40 mg bid x 3 days, then back to 40 mg daily. BMET/BNP today; repeat BMET 10 days.  - Take extra 20 mEq of KCl daily (total of 40 daily) x 3 days, then back to home dose of 20 mEq KCl. - Decrease Toprol XL to 12.5 mg q hs (allow more BP room for diuresis). - Continue [name] 10 mg daily.  - Continue spironolactone 12.5 mg daily (no BP room to increase), take at night given soft BP.  - Off Entresto with recent syncope & low BP. - Given age, would not pursue ICD.  Narrow QRS so not CRT candidate.  3. 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 PVR 8.1 on 2/22 cath. V/Q scan not suggestive of chronic PE. Possible group 1 PH component.  PH is contributing to RV systolic dysfunction. 6 minute walk shows significant limitation.  - Continue Tyvaso, but will see if we can get the dry powder inhaler for him.    - Continue use CPAP.  4. Pulmonary fibrosis: Restrictive PFTs in 2/22.  CT chest in 12/21 with progressive pulmonary fibrosis and honeycombing.  COVID-19 lung involvement could have worsened pulmonary parenchymal disease.        - He follows with pulmonary, would he be a candidate for antifibrotic therapy? Dr. Aundra Dubin to discuss with Dr. Chase Caller.  - Continue home oxygen, will try to get him a smaller more mobile tank.  5. Atrial flutter: Noted in 1/15, had ablation in 2/15, no atrial arrhythmias documented since that time.  He is in NSR today.  He is not anticoagulated.   Followup in 2 months with Dr. Aundra Dubin as scheduled.   Hoagland FNP 05/25/2021

## 2021-05-25 NOTE — Patient Instructions (Addendum)
INCREASE Lasix to 40 mg twice a day for 3 days, then change to 60 mg  (one and one half tab) daily thereafter INCREASE Potassium to 40 meq daily for 3 days, then resume normal dose of 20 meq daily thereafter   Labs today We will only contact you if something comes back abnormal or we need to make some changes. Otherwise no news is good news!  Labs needed in 10 days  Keep follow up as scheduled with Dr Aundra Dubin   Your provider has recommended that  you wear a Zio Patch for 14 days.  This monitor will record your heart rhythm for our review.  IF you have any symptoms while wearing the monitor please press the button.  If you have any issues with the patch or you notice a red or orange light on it please call the company at [phone].  Once you remove the patch please mail it back to the company as soon as possible so we can get the results.   Your physician has requested that you regularly monitor and record your blood pressure and heart rate readings at home. Please use the same machine at the same time of day to check your readings and record them to bring to your follow-up visit.   Do the following things EVERYDAY: Weigh yourself in the morning before breakfast. Write it down and keep it in a log. Take your medicines as prescribed Eat low salt foods--Limit salt (sodium) to 2000 mg per day.  Stay as active as you can everyday Limit all fluids for the day to less than 2 liters  milAt the [facility], you and your health needs are our priority. As part of our continuing mission to provide you with exceptional heart care, we have created designated Provider Care Teams. These Care Teams include your primary Cardiologist (physician) and Advanced Practice Providers (APPs- Physician Assistants and Nurse Practitioners) who all work together to provide you with the care you need, when you need it.   You may see any of the following providers on your designated Care Team at  your next follow up: Dr Glori Bickers Dr Loralie Champagne Dr Patrice Paradise, NP Lyda Jester, [place] Ginnie Smart Audry Riles, PharmD   Please be sure to bring in all your medications bottles to every appointment.   If you have any questions or concerns before your next appointment please send [place] a message through [place] or call our office at [phone].    TO LEAVE A MESSAGE FOR THE NURSE SELECT OPTION 2, PLEASE LEAVE A MESSAGE INCLUDING: YOUR NAME DATE OF BIRTH CALL BACK NUMBER REASON FOR CALL**this is important as we prioritize the call backs  YOU WILL RECEIVE A CALL BACK THE SAME DAY AS LONG AS YOU CALL BEFORE 4:00 PM

## 2021-05-25 NOTE — Progress Notes (Signed)
ReDS Vest / Clip - 05/25/21 1100       ReDS Vest / Clip   Station Marker A    Ruler Value 30    ReDS Value Range High volume overload    ReDS Actual Value 43

## 2021-05-26 ENCOUNTER — Other Ambulatory Visit: Payer: Self-pay | Admitting: Cardiovascular Disease

## 2021-05-27 ENCOUNTER — Telehealth: Payer: Self-pay | Admitting: Cardiology

## 2021-05-27 NOTE — Telephone Encounter (Signed)
Spoke with patients wife. Explained a red light blinking means the monitor is malfunctioning. I have messaged [employer] and a new monitor is being mailed to there house. She  is aware to mail the 1st monitor back.

## 2021-05-27 NOTE — Telephone Encounter (Signed)
Patient wife called stating she thinks that patient got his monitor wet in the shower yesterday. Has red light blinking, not sticking to the skin well. No other issues. Informed I would route to monitor staff to reach out to the patient regarding further instructions. They voiced understanding.

## 2021-06-01 ENCOUNTER — Telehealth: Payer: Self-pay | Admitting: Family Medicine

## 2021-06-01 NOTE — Telephone Encounter (Signed)
Will keep eye out for form.  Will not be completed until Dr. Diona Browner returns to office on Tuesday 06/07/2021.

## 2021-06-01 NOTE — Telephone Encounter (Signed)
Ms. Andree Elk called in from [employer] and wanted to know about getting forms for continuation of oxygen for Caleb Bryant and they are needing progress notes and medical necessity. She stated she sent it on 7/15 and she is going to resend it today

## 2021-06-02 NOTE — Telephone Encounter (Signed)
Will need appt to determine medical necessity of oxygen unless home health has done oxygen saturation measurements at home that I can review.

## 2021-06-02 NOTE — Telephone Encounter (Addendum)
This was under Caleb Bryant's Care Coordination Note in his chart.  Caleb Bryant, CMA Mon Apr 18, 2021 10:40 AM   SATURATION QUALIFICATIONS: (This note is used to comply with regulatory documentation for home oxygen)   Patient Saturations on Room Air at Rest = 94%   Patient Saturations on Room Air while Ambulating = 88%   Patient Saturations on 3 Liters of oxygen while Ambulating = 95%   Please briefly explain why patient needs home oxygen:   2L cont-- started 08/2020 with PCP [id number]   He was seen by [name] on 04/18/2021.  I faxed forms over to them to complete.

## 2021-06-07 NOTE — Telephone Encounter (Signed)
Noted.

## 2021-06-12 DIAGNOSIS — J449 Chronic obstructive pulmonary disease, unspecified: Secondary | ICD-10-CM | POA: Diagnosis not present

## 2021-06-13 ENCOUNTER — Telehealth: Payer: Self-pay | Admitting: Pulmonary Disease

## 2021-06-13 NOTE — Telephone Encounter (Signed)
Amado Coe, RN     4:31 PM Note Called and spoke with wife. Patient is super short of breath on 4L oxygen. Does not want to go to the hospital his oxygen is dropping to 70's when moving around. Has a landmark service that comes to check on him but oxygen was good while sitting still and lungs sounded clear, Patient was made an appointment in [place] no availability in [place].  I re instructed wife if oxygen drops below 88%  they need to call 911.      Valerie Salts, CMA     9:49 AM Note Lucrezia Europe but she did not answer. Left message for her to call us back.         MM   9:31 AM Thana Farr M routed this conversation to [facility] Triage Bo Mcclintock (Emergency Contact) to Thana Farr M   SW   9:29 AM Pt's wife(dpr) states husband is struggling with O2 saturation even on oxygen. Levels are dropping into the 80's. Pt fell out at church yesterday. Wants to be seen. Her # is [phone]

## 2021-06-13 NOTE — Telephone Encounter (Signed)
Called [name] but she did not answer. Left message for her to call us back.

## 2021-06-13 NOTE — Telephone Encounter (Signed)
Called and spoke with wife. Patient is super short of breath on 4L oxygen. Does not want to go to the hospital his oxygen is dropping to 70's when moving around. Has a landmark service that comes to check on him but oxygen was good while sitting still and lungs sounded clear, Patient was made an appointment in [place] no availability in [place].  I re instructed wife if oxygen drops below 88%  they need to call 911.

## 2021-06-14 ENCOUNTER — Ambulatory Visit: Payer: PPO | Admitting: Primary Care

## 2021-06-15 ENCOUNTER — Ambulatory Visit
Admission: RE | Admit: 2021-06-15 | Discharge: 2021-06-15 | Disposition: A | Discharge status: Home / Self Care | Payer: PPO | Attending: Primary Care | Admitting: Primary Care

## 2021-06-15 ENCOUNTER — Ambulatory Visit: Payer: PPO | Admitting: Primary Care

## 2021-06-15 ENCOUNTER — Other Ambulatory Visit: Payer: Self-pay

## 2021-06-15 ENCOUNTER — Other Ambulatory Visit
Admission: RE | Admit: 2021-06-15 | Discharge: 2021-06-15 | Disposition: A | Discharge status: Home / Self Care | Payer: PPO | Source: Home / Self Care

## 2021-06-15 ENCOUNTER — Ambulatory Visit
Admission: RE | Admit: 2021-06-15 | Discharge: 2021-06-15 | Disposition: A | Discharge status: Home / Self Care | Payer: PPO | Source: Ambulatory Visit | Attending: Primary Care | Admitting: Primary Care

## 2021-06-15 ENCOUNTER — Encounter: Payer: Self-pay | Admitting: Primary Care

## 2021-06-15 VITALS — BP 98/70 | HR 83 | Temp 97.9°F | Ht 64.0 in | Wt 168.4 lb

## 2021-06-15 DIAGNOSIS — R0902 Hypoxemia: Secondary | ICD-10-CM | POA: Diagnosis not present

## 2021-06-15 DIAGNOSIS — J9601 Acute respiratory failure with hypoxia: Secondary | ICD-10-CM

## 2021-06-15 DIAGNOSIS — J8410 Pulmonary fibrosis, unspecified: Secondary | ICD-10-CM

## 2021-06-15 DIAGNOSIS — R0602 Shortness of breath: Secondary | ICD-10-CM

## 2021-06-15 DIAGNOSIS — I272 Pulmonary hypertension, unspecified: Secondary | ICD-10-CM | POA: Insufficient documentation

## 2021-06-15 DIAGNOSIS — J84112 Idiopathic pulmonary fibrosis: Secondary | ICD-10-CM

## 2021-06-15 DIAGNOSIS — J449 Chronic obstructive pulmonary disease, unspecified: Secondary | ICD-10-CM

## 2021-06-15 LAB — BASIC METABOLIC PANEL
Anion gap: 9 (ref 5–15)
BUN: 30 mg/dL — ABNORMAL HIGH (ref 8–23)
CO2: 29 mmol/L (ref 22–32)
Calcium: 9.3 mg/dL (ref 8.9–10.3)
Chloride: 100 mmol/L (ref 98–111)
Creatinine, Ser: 1.7 mg/dL — ABNORMAL HIGH (ref 0.61–1.24)
GFR, Estimated: 39 mL/min — ABNORMAL LOW (ref >60)
Glucose, Bld: 132 mg/dL — ABNORMAL HIGH (ref 70–99)
Potassium: 4.6 mmol/L (ref 3.5–5.1)
Sodium: 138 mmol/L (ref 135–145)

## 2021-06-15 LAB — BRAIN NATRIURETIC PEPTIDE: B Natriuretic Peptide: 905 pg/mL — ABNORMAL HIGH (ref 0.0–100.0)

## 2021-06-15 MED ORDER — PREDNISONE 10 MG PO TABS: ORAL_TABLET | ORAL | 0 refills | Status: DC

## 2021-06-15 NOTE — Assessment & Plan Note (Signed)
-  CT chest in December 2021 showed progressive pulmoanry fibrosis. He had covid -19 which likely worsened parenchymal disease. May be candidate for antifibrotics. We will review after repeat imaging.

## 2021-06-15 NOTE — Assessment & Plan Note (Signed)
-  Continue Breztri aerosphere two puffs twice daily

## 2021-06-15 NOTE — Progress Notes (Signed)
_0  ID: Caleb Alexanders Sr., male    DOB: [birth year], 86 y.o.   MRN: [id number]  No chief complaint on file.   Referring provider: [name], MD  HPI: 87 year old male, former smoker (90-pack-year history).  Past medical history significant for COPD, idiopathic pulmonary fibrosis, respiratory failure with hypoxia, chronic systolic heart failure, thoracic aortic aneurysm without rupture, chronic kidney disease stage III, GERD, mild dementia. Patient of Dr. Patsey Bryant.   06/15/2021 Patient presents today for an acute office visit.  He called our office on 06/13/2021 with reports of increased shortness of breath on 4 L of oxygen.  Patient reported his oxygen dropping to the 70-80s while moving around. Wife reports he almost passed out at church this past weekend. They increased his oxygen to 6L and he came back around after about 15 mins. He refused to go to ED.   He appears at his baseline today. He has no acute upper respiratory symptoms. He has been more short of breath the last 2.5-3 weeks. He has a chronic cough with clear mucus and some intermittent chest tightness. He is on [employer] 2 puffs twice a day, Tyvaso QID and lasix 68m daily. He was taken off Entresto 1 month ago d/t hypotension. He is followed by Dr. MAundra Dubinfor CHF/pulmonary HTN.    His O2 was 88% on 2L with light exertion but recovered to 96% with rest. He is normally on 4L but is using less today to conserve oxygen. We switch him to one of our tanks and placed him on his normal liter flow of 4L/MIN. He declined ambulatory walk test today. CT chest in December 2021 showed progressive pulmoanry fibrosis. He had covid -19 which likely worsened parenchymal disease.   Allergies  Allergen Reactions   Codeine Nausea And Vomiting    Immunization History  Administered Date(s) Administered   Influenza Split 07/31/2011, 08/08/2012, 09/01/2013   Influenza Whole 10/05/2004, 08/16/2007, 08/11/2008, 08/06/2009    Influenza, High Dose Seasonal PF 07/27/2016, 08/09/2017, 08/01/2018, 07/25/2019, 07/09/2020   Influenza,inj,Quad PF,6+ Mos 06/22/2014, 10/14/2015   Influenza-Unspecified 08/30/2018   Pneumococcal Conjugate-13 06/22/2014   Pneumococcal Polysaccharide-23 04/14/2009   Td 10/31/1997, 05/13/2010, 12/05/2019    Past Medical History:  Diagnosis Date   Anxiety    Atrial flutter ([place]    "just dx'd today" (11/04/2013)   Benign prostatic hypertrophy    Borderline diabetes    CHF (congestive heart failure) (HCC)    Colon, diverticulosis    COPD (chronic obstructive pulmonary disease) (HCC)    Dysrhythmia    Exertional shortness of breath    Gout    Hyperlipidemia    Hypertension    Osteoarthritis    "all over" (11/04/2013)   PVD (peripheral vascular disease) ([place]     Tobacco History: Social History   Tobacco Use  Smoking Status Former   Packs/day: 3.00   Years: 30.00   Pack years: 90.00   Types: Cigarettes   Quit date: 1980   Years since quitting: 42.6  Smokeless Tobacco Former   Types: Chew  Tobacco Comments   11/04/2013 "quit smoking in the 1980's or so; quit chewing before I quit smoking"   Counseling given: Not Answered Tobacco comments: 11/04/2013 "quit smoking in the 1980's or so; quit chewing before I quit smoking"   Outpatient Medications Prior to Visit  Medication Sig Dispense Refill   albuterol (VENTOLIN HFA) 108 (90 Base) MCG/ACT inhaler Inhale 2 puffs into the lungs every 4 (four) hours as needed for shortness of  breath or wheezing. 18 g 0   allopurinol (ZYLOPRIM) 100 MG tablet TAKE 1 TABLET(100 MG) BY MOUTH DAILY 90 tablet 3   aspirin 81 MG chewable tablet Chew 81 mg by mouth daily.     atorvastatin (LIPITOR) 20 MG tablet TAKE 1 TABLET BY MOUTH DAILY 90 tablet 3   Budeson-Glycopyrrol-Formoterol (BREZTRI AEROSPHERE) 160-9-4.8 MCG/ACT AERO Inhale 2 puffs into the lungs in the morning and at bedtime. 10.7 g 11   Budeson-Glycopyrrol-Formoterol (BREZTRI AEROSPHERE)  160-9-4.8 MCG/ACT AERO Inhale 2 puffs into the lungs in the morning and at bedtime. 5.9 g 0   Cyanocobalamin (B-12 PO) Take 1,000 mcg by mouth daily.      empagliflozin ([name]) 10 MG TABS tablet Take 1 tablet (10 mg total) by mouth daily before breakfast. 30 tablet 6   furosemide (LASIX) 40 MG tablet Take 1 tablet (40 mg total) by mouth 2 (two) times daily for 3 days, THEN 1.5 tablets (60 mg total) daily. 45 tablet 11   metoprolol succinate (TOPROL-XL) 25 MG 24 hr tablet TAKE 1 TABLET(25 MG) BY MOUTH DAILY 30 tablet 2   potassium chloride SA (KLOR-CON) 20 MEQ tablet Take 1 tablet (20 mEq total) by mouth daily. 90 tablet 3   pregabalin (LYRICA) 50 MG capsule 1 tablet at night, if toleraing but still pain increase  50  mg weekly until effect and max 150 mg at night. 90 capsule 1   spironolactone (ALDACTONE) 25 MG tablet TAKE 1/2 TABLET(12.5 MG) BY MOUTH DAILY 15 tablet 11   Treprostinil (TYVASO) 0.6 MG/ML SOLN Inhale 18 mcg into the lungs in the morning, at noon, in the evening, and at bedtime.     No facility-administered medications prior to visit.    Review of Systems  Review of Systems  Constitutional:  Positive for fatigue.  HENT: Negative.    Respiratory:  Positive for cough, chest tightness and shortness of breath. Negative for wheezing.   Cardiovascular: Negative.     Physical Exam  BP 98/70 (BP Location: Left Arm, Patient Position: Sitting, Cuff Size: Normal)   Pulse 83   Temp 97.9 F (36.6 C) (Oral)   Ht _0  (1.626 m)   Wt 168 lb 6.4 oz (76.4 kg)   BMI 28.91 kg/m  Physical Exam Constitutional:      Appearance: Normal appearance.  HENT:     Head: Normocephalic and atraumatic.  Cardiovascular:     Rate and Rhythm: Normal rate and regular rhythm.  Pulmonary:     Effort: Pulmonary effort is normal.     Breath sounds: Rales present. No wheezing or rhonchi.  Skin:    General: Skin is warm and dry.  Neurological:     General: No focal deficit present.     Mental  Status: He is alert. Mental status is at baseline.  Psychiatric:        Mood and Affect: Mood normal.        Behavior: Behavior normal.        Thought Content: Thought content normal.        Judgment: Judgment normal.     Lab Results:  CBC    Component Value Date/Time   WBC 11.7 (H) 12/09/2020 0950   WBC 12.5 (H) 10/05/2020 1522   RBC 4.32 12/09/2020 0950   RBC 5.10 10/05/2020 1522   HGB 14.2 12/09/2020 0950   HCT 41.0 12/09/2020 0950   PLT 272 12/09/2020 0950   MCV 95 12/09/2020 0950   MCV 95 10/27/2014 1211  MCH 32.9 12/09/2020 0950   MCH 32.2 08/03/2020 0530   MCHC 34.6 12/09/2020 0950   MCHC 33.3 10/05/2020 1522   RDW 13.5 12/09/2020 0950   RDW 14.0 10/27/2014 1211   LYMPHSABS 2.0 12/09/2020 0950   LYMPHSABS 2.1 10/27/2014 1211   MONOABS 1.1 (H) 10/05/2020 1522   MONOABS 1.0 10/27/2014 1211   EOSABS 0.4 12/09/2020 0950   EOSABS 0.4 10/27/2014 1211   BASOSABS 0.1 12/09/2020 0950   BASOSABS 0.0 10/27/2014 1211    BMET    Component Value Date/Time   NA 138 06/15/2021 1656   NA 142 12/09/2020 0950   NA 139 10/27/2014 1211   K 4.6 06/15/2021 1656   K 4.6 10/27/2014 1211   CL 100 06/15/2021 1656   CL 100 10/27/2014 1211   CO2 29 06/15/2021 1656   CO2 33 (H) 10/27/2014 1211   GLUCOSE 132 (H) 06/15/2021 1656   GLUCOSE 100 (H) 10/27/2014 1211   BUN 30 (H) 06/15/2021 1656   BUN 23 12/09/2020 0950   BUN 17 10/27/2014 1211   CREATININE 1.70 (H) 06/15/2021 1656   CREATININE 1.41 (H) 01/25/2016 1625   CALCIUM 9.3 06/15/2021 1656   CALCIUM 9.2 10/27/2014 1211   GFRNONAA 39 (L) 06/15/2021 1656   GFRNONAA >60 10/27/2014 1211   GFRAA 62 12/09/2020 0950   GFRAA >60 10/27/2014 1211    BNP    Component Value Date/Time   BNP 905.0 (H) 06/15/2021 1656    ProBNP    Component Value Date/Time   PROBNP 572.1 (H) 11/04/2013 1932    Imaging: DG Chest 2 View  Result Date: 06/15/2021 CLINICAL DATA:  Shortness of breath hypoxia EXAM: CHEST - 2 VIEW COMPARISON:   02/02/2021 FINDINGS: Cardiac shadow is stable. The lungs are again well aerated with changes of interstitial lung disease as characterized on prior CT examination. No new focal infiltrate or effusion is seen. No bony abnormality is noted. IMPRESSION: Chronic changes consistent with the known history of interstitial lung disease. No acute abnormality noted. Electronically Signed   By: Inez Catalina M.D.   On: 06/15/2021 17:20     Assessment & Plan:   Acute respiratory failure with hypoxia (HCC) - Increased dyspnea symptoms along with exertion hypoxia. O2 was 88% 2L, recovered to 96% with rest. Baseline O2 is 4L/min. CXR today showed no acute cardiopulmonary disease, chronic ILD findings. We will get HRCT to evaluate for possible progression. Sent in prednisone taper for suspected ILD exacerbation d/t intermittent chest tightness. BNP is pending, may need to adjust diuretics.   Idiopathic pulmonary fibrosis ([place]) - CT chest in December 2021 showed progressive pulmoanry fibrosis. He had covid -19 which likely worsened parenchymal disease. May be candidate for antifibrotics. We will review after repeat imaging.   Pulmonary HTN ([place]) - Suspect pulmonary hypertension related to interstitial lung disease/pulmonary fibrosis (group 3 PH). Continue Tyvaso QID as directed, lasix 53m daily. Following with Dr. MAundra Bryant   Chronic obstructive pulmonary disease (HCC) - Continue Breztri aerosphere two puffs twice daily   Recommendations: Continue Lasix 64mdaily- unless directed otherwise  Continue Tyvaso as directed Continue Breztri aerosphere two puffs morning and evening  Orders: CXR and labs today  Follow-up: First available with Dr. GoGuadlupe SpanishNP 06/15/2021

## 2021-06-15 NOTE — Telephone Encounter (Signed)
Patient is scheduled for an appointment today: Next Appt With Pulmonology Martyn Ehrich, NP)06/15/2021 at  3:30 PM. Nothing further needed at this time.

## 2021-06-15 NOTE — Progress Notes (Signed)
Please let patient know his BNP was elevated, have him call Dr. Aundra Dubin office to see if they want to adjust his diuretics. If he can not speak with a nurse ask our on-call provider to direct

## 2021-06-15 NOTE — Patient Instructions (Signed)
  Recommendations: Continue Lasix 68m daily- unless directed otherwise  Continue Tyvaso as directed Continue Breztri aerosphere two puffs morning and evening  Orders: CXR and labs today  Follow-up: First available with Dr. GPatsey Berthold

## 2021-06-15 NOTE — Assessment & Plan Note (Signed)
-  Increased dyspnea symptoms along with exertion hypoxia. O2 was 88% 2L, recovered to 96% with rest. Baseline O2 is 4L/min. CXR today showed no acute cardiopulmonary disease, chronic ILD findings. We will get HRCT to evaluate for possible progression. Sent in prednisone taper for suspected ILD exacerbation d/t intermittent chest tightness. BNP is pending, may need to adjust diuretics.

## 2021-06-15 NOTE — Assessment & Plan Note (Signed)
-  Suspect pulmonary hypertension related to interstitial lung disease/pulmonary fibrosis (group 3 PH). Continue Tyvaso QID as directed, lasix 59m daily. Following with Dr. [name]

## 2021-06-17 ENCOUNTER — Other Ambulatory Visit (HOSPITAL_COMMUNITY): Payer: Self-pay | Admitting: Cardiology

## 2021-06-17 DIAGNOSIS — R55 Syncope and collapse: Secondary | ICD-10-CM | POA: Diagnosis not present

## 2021-06-20 ENCOUNTER — Telehealth (HOSPITAL_COMMUNITY): Payer: Self-pay | Admitting: *Deleted

## 2021-06-20 ENCOUNTER — Telehealth: Payer: Self-pay | Admitting: Pulmonary Disease

## 2021-06-20 DIAGNOSIS — I5022 Chronic systolic (congestive) heart failure: Secondary | ICD-10-CM

## 2021-06-20 DIAGNOSIS — J8410 Pulmonary fibrosis, unspecified: Secondary | ICD-10-CM

## 2021-06-20 NOTE — Telephone Encounter (Signed)
Received VM from pt's wife stating pt was seen by pulmonary and labs show bnp is elevated and kidneys are worse.  Called and spoke w/pt's wife, she states pt was very SOB last week and was dropping his oxygen sats, he saw pulm on 8/17 and was started on Prednisone. Labs done on 8/17 with BNP 905 (up from 584) and BUN/Cr 30/1.7 (up from 24/1.5). Pulmonary advised him to call us for further recommendations. She states his SOB is better than it was last week, his wt is stable at 163-165, no LE edema but abd is distended. Pt is taking Lasix 60 mg Daily, advised to take an extra 40 mg tonight to see if that helps, will send to Dr Aundra Dubin for further review.

## 2021-06-20 NOTE — Telephone Encounter (Signed)
Martyn Ehrich, NP  06/15/2021  6:00 PM EDT Back to Top    Please let patient know his BNP was elevated, have him call Dr. Aundra Dubin office to see if they want to adjust his diuretics. If he can not speak with a nurse ask our on-call provider to direct    Martyn Ehrich, NP  06/15/2021  5:30 PM EDT     Kidney function is come worse, patient needs to follow-up with pcp. Awaiting BNP, patient will call for results tomorrow, if elevated may need to increase diuretic and/or follow-up with cardiology      Martyn Ehrich, NP  06/15/2021  5:31 PM EDT     No acute findings on CXR, I will be ordering HRCT to better evaluate fibrosis. Patient is aware of results     Patient's spouse,[name] (DPR) is aware of results and voiced her understanding.HRCT has been ordered. Nothing further needed.

## 2021-06-21 NOTE — Telephone Encounter (Signed)
Difficult to know whether this is due to progressive pulmonary fibrosis or CHF.  Would have him take Lasix 60 qam/40 qpm x 3 days, then Lasix 60 qam and 40 every other pm after that.  Will need BMET in 1 week and work in to see [place] in office (me or PA).

## 2021-06-22 DIAGNOSIS — Z6826 Body mass index (BMI) 26.0-26.9, adult: Secondary | ICD-10-CM | POA: Diagnosis not present

## 2021-06-22 DIAGNOSIS — J449 Chronic obstructive pulmonary disease, unspecified: Secondary | ICD-10-CM | POA: Diagnosis not present

## 2021-06-22 DIAGNOSIS — Z8616 Personal history of COVID-19: Secondary | ICD-10-CM | POA: Diagnosis not present

## 2021-06-22 DIAGNOSIS — I5022 Chronic systolic (congestive) heart failure: Secondary | ICD-10-CM | POA: Diagnosis not present

## 2021-06-22 DIAGNOSIS — J84112 Idiopathic pulmonary fibrosis: Secondary | ICD-10-CM | POA: Diagnosis not present

## 2021-06-22 DIAGNOSIS — Z9981 Dependence on supplemental oxygen: Secondary | ICD-10-CM | POA: Diagnosis not present

## 2021-06-22 DIAGNOSIS — I272 Pulmonary hypertension, unspecified: Secondary | ICD-10-CM | POA: Diagnosis not present

## 2021-06-23 MED ORDER — FUROSEMIDE 40 MG PO TABS: ORAL_TABLET | ORAL | 11 refills | Status: DC

## 2021-06-23 NOTE — Telephone Encounter (Signed)
Spoke with pt's wife, she is aware, agreeable, and verbalized understanding, she states pt is feeling a little better. She will take him to [place] next week for labs, she would like to keep appt with Dr Aundra Dubin as sch on 9/21, advised as long as he is feeling better that is ok but to call us back if worsens or wants to be seen sooner, she is agreeable.

## 2021-07-05 ENCOUNTER — Other Ambulatory Visit (HOSPITAL_COMMUNITY): Payer: Self-pay | Admitting: *Deleted

## 2021-07-05 DIAGNOSIS — I5022 Chronic systolic (congestive) heart failure: Secondary | ICD-10-CM

## 2021-07-07 ENCOUNTER — Other Ambulatory Visit: Payer: Self-pay

## 2021-07-07 ENCOUNTER — Ambulatory Visit
Admission: RE | Admit: 2021-07-07 | Discharge: 2021-07-07 | Disposition: A | Discharge status: Home / Self Care | Payer: PPO | Source: Ambulatory Visit | Attending: Primary Care | Admitting: Primary Care

## 2021-07-07 ENCOUNTER — Other Ambulatory Visit
Admission: RE | Admit: 2021-07-07 | Discharge: 2021-07-07 | Disposition: A | Discharge status: Home / Self Care | Payer: PPO | Source: Home / Self Care | Attending: Cardiology | Admitting: Cardiology

## 2021-07-07 DIAGNOSIS — I700 Atherosclerosis of aorta: Secondary | ICD-10-CM | POA: Diagnosis not present

## 2021-07-07 DIAGNOSIS — J439 Emphysema, unspecified: Secondary | ICD-10-CM | POA: Diagnosis not present

## 2021-07-07 DIAGNOSIS — I5022 Chronic systolic (congestive) heart failure: Secondary | ICD-10-CM | POA: Insufficient documentation

## 2021-07-07 DIAGNOSIS — J8410 Pulmonary fibrosis, unspecified: Secondary | ICD-10-CM | POA: Insufficient documentation

## 2021-07-07 DIAGNOSIS — J189 Pneumonia, unspecified organism: Secondary | ICD-10-CM | POA: Diagnosis not present

## 2021-07-07 DIAGNOSIS — J479 Bronchiectasis, uncomplicated: Secondary | ICD-10-CM | POA: Diagnosis not present

## 2021-07-07 LAB — BASIC METABOLIC PANEL
Anion gap: 8 (ref 5–15)
BUN: 24 mg/dL — ABNORMAL HIGH (ref 8–23)
CO2: 31 mmol/L (ref 22–32)
Calcium: 9.2 mg/dL (ref 8.9–10.3)
Chloride: 99 mmol/L (ref 98–111)
Creatinine, Ser: 1.61 mg/dL — ABNORMAL HIGH (ref 0.61–1.24)
GFR, Estimated: 42 mL/min — ABNORMAL LOW (ref >60)
Glucose, Bld: 103 mg/dL — ABNORMAL HIGH (ref 70–99)
Potassium: 3.9 mmol/L (ref 3.5–5.1)
Sodium: 138 mmol/L (ref 135–145)

## 2021-07-08 NOTE — Progress Notes (Signed)
Please let patient know HRCT showed no progressions of his pulmonary fibrosis. No suspicious nodules or masses. He has moderate emphysema. Incidental findings atherosclerosis, 3 vessel coronary artery disease and mild cardiomegaly. Please order to cardiology for ongoing dyspnea and CAD

## 2021-07-11 ENCOUNTER — Encounter: Payer: Self-pay | Admitting: Physician Assistant

## 2021-07-11 ENCOUNTER — Emergency Department
Admission: EM | Admit: 2021-07-11 | Discharge: 2021-07-11 | Disposition: A | Discharge status: Home / Self Care | Payer: PPO | Attending: Student in an Organized Health Care Education/Training Program | Admitting: Student in an Organized Health Care Education/Training Program

## 2021-07-11 ENCOUNTER — Ambulatory Visit: Payer: PPO | Admitting: Physician Assistant

## 2021-07-11 ENCOUNTER — Emergency Department: Payer: PPO

## 2021-07-11 ENCOUNTER — Telehealth: Payer: Self-pay | Admitting: Cardiovascular Disease

## 2021-07-11 ENCOUNTER — Other Ambulatory Visit: Payer: Self-pay

## 2021-07-11 ENCOUNTER — Telehealth: Payer: Self-pay | Admitting: Physician Assistant

## 2021-07-11 ENCOUNTER — Other Ambulatory Visit
Admission: RE | Admit: 2021-07-11 | Discharge: 2021-07-11 | Disposition: A | Discharge status: Home / Self Care | Payer: PPO | Source: Ambulatory Visit | Attending: *Deleted | Admitting: *Deleted

## 2021-07-11 ENCOUNTER — Telehealth: Payer: Self-pay | Admitting: Pulmonary Disease

## 2021-07-11 VITALS — BP 108/60 | HR 84 | Ht 64.0 in | Wt 168.0 lb

## 2021-07-11 DIAGNOSIS — Z7984 Long term (current) use of oral hypoglycemic drugs: Secondary | ICD-10-CM | POA: Insufficient documentation

## 2021-07-11 DIAGNOSIS — Z79899 Other long term (current) drug therapy: Secondary | ICD-10-CM | POA: Diagnosis not present

## 2021-07-11 DIAGNOSIS — Z7951 Long term (current) use of inhaled steroids: Secondary | ICD-10-CM | POA: Diagnosis not present

## 2021-07-11 DIAGNOSIS — I272 Pulmonary hypertension, unspecified: Secondary | ICD-10-CM | POA: Insufficient documentation

## 2021-07-11 DIAGNOSIS — J449 Chronic obstructive pulmonary disease, unspecified: Secondary | ICD-10-CM | POA: Insufficient documentation

## 2021-07-11 DIAGNOSIS — J8410 Pulmonary fibrosis, unspecified: Secondary | ICD-10-CM | POA: Diagnosis not present

## 2021-07-11 DIAGNOSIS — I5023 Acute on chronic systolic (congestive) heart failure: Secondary | ICD-10-CM

## 2021-07-11 DIAGNOSIS — E1122 Type 2 diabetes mellitus with diabetic chronic kidney disease: Secondary | ICD-10-CM | POA: Diagnosis not present

## 2021-07-11 DIAGNOSIS — I10 Essential (primary) hypertension: Secondary | ICD-10-CM

## 2021-07-11 DIAGNOSIS — Z8679 Personal history of other diseases of the circulatory system: Secondary | ICD-10-CM

## 2021-07-11 DIAGNOSIS — Z7982 Long term (current) use of aspirin: Secondary | ICD-10-CM | POA: Insufficient documentation

## 2021-07-11 DIAGNOSIS — Z87891 Personal history of nicotine dependence: Secondary | ICD-10-CM | POA: Diagnosis not present

## 2021-07-11 DIAGNOSIS — I5022 Chronic systolic (congestive) heart failure: Secondary | ICD-10-CM | POA: Insufficient documentation

## 2021-07-11 DIAGNOSIS — N1831 Chronic kidney disease, stage 3a: Secondary | ICD-10-CM | POA: Insufficient documentation

## 2021-07-11 DIAGNOSIS — R0602 Shortness of breath: Secondary | ICD-10-CM

## 2021-07-11 DIAGNOSIS — I130 Hypertensive heart and chronic kidney disease with heart failure and stage 1 through stage 4 chronic kidney disease, or unspecified chronic kidney disease: Secondary | ICD-10-CM | POA: Diagnosis not present

## 2021-07-11 DIAGNOSIS — E1140 Type 2 diabetes mellitus with diabetic neuropathy, unspecified: Secondary | ICD-10-CM | POA: Diagnosis not present

## 2021-07-11 DIAGNOSIS — I2699 Other pulmonary embolism without acute cor pulmonale: Secondary | ICD-10-CM | POA: Diagnosis not present

## 2021-07-11 DIAGNOSIS — R0902 Hypoxemia: Secondary | ICD-10-CM

## 2021-07-11 DIAGNOSIS — I517 Cardiomegaly: Secondary | ICD-10-CM | POA: Diagnosis not present

## 2021-07-11 DIAGNOSIS — R55 Syncope and collapse: Secondary | ICD-10-CM

## 2021-07-11 DIAGNOSIS — G4733 Obstructive sleep apnea (adult) (pediatric): Secondary | ICD-10-CM

## 2021-07-11 LAB — BASIC METABOLIC PANEL
Anion gap: 11 (ref 5–15)
Anion gap: 11 (ref 5–15)
BUN: 27 mg/dL — ABNORMAL HIGH (ref 8–23)
BUN: 29 mg/dL — ABNORMAL HIGH (ref 8–23)
CO2: 29 mmol/L (ref 22–32)
CO2: 29 mmol/L (ref 22–32)
Calcium: 8.8 mg/dL — ABNORMAL LOW (ref 8.9–10.3)
Calcium: 8.9 mg/dL (ref 8.9–10.3)
Chloride: 99 mmol/L (ref 98–111)
Chloride: 99 mmol/L (ref 98–111)
Creatinine, Ser: 1.56 mg/dL — ABNORMAL HIGH (ref 0.61–1.24)
Creatinine, Ser: 1.7 mg/dL — ABNORMAL HIGH (ref 0.61–1.24)
GFR, Estimated: 43 mL/min — ABNORMAL LOW (ref >60)
GFR, Estimated: 39 mL/min — ABNORMAL LOW (ref >60)
Glucose, Bld: 118 mg/dL — ABNORMAL HIGH (ref 70–99)
Glucose, Bld: 118 mg/dL — ABNORMAL HIGH (ref 70–99)
Potassium: 3.7 mmol/L (ref 3.5–5.1)
Potassium: 3.7 mmol/L (ref 3.5–5.1)
Sodium: 139 mmol/L (ref 135–145)
Sodium: 139 mmol/L (ref 135–145)

## 2021-07-11 LAB — CBC
HCT: 42.7 % (ref 39.0–52.0)
Hemoglobin: 14.7 g/dL (ref 13.0–17.0)
MCH: 33.9 pg (ref 26.0–34.0)
MCHC: 34.4 g/dL (ref 30.0–36.0)
MCV: 98.4 fL (ref 80.0–100.0)
Platelets: 281 10*3/uL (ref 150–400)
RBC: 4.34 MIL/uL (ref 4.22–5.81)
RDW: 12.9 % (ref 11.5–15.5)
WBC: 10.5 10*3/uL (ref 4.0–10.5)
nRBC: 0 % (ref 0.0–0.2)

## 2021-07-11 LAB — D-DIMER, QUANTITATIVE: D-Dimer, Quant: 1.11 ug/mL-FEU — ABNORMAL HIGH (ref 0.00–0.50)

## 2021-07-11 LAB — BRAIN NATRIURETIC PEPTIDE: B Natriuretic Peptide: 774.6 pg/mL — ABNORMAL HIGH (ref 0.0–100.0)

## 2021-07-11 LAB — TSH: TSH: 1.003 u[IU]/mL (ref 0.350–4.500)

## 2021-07-11 LAB — T4, FREE: Free T4: 0.88 ng/dL (ref 0.61–1.12)

## 2021-07-11 MED ORDER — PREDNISONE 20 MG PO TABS
40.0000 mg | ORAL_TABLET | Freq: Once | ORAL | Status: AC
  Administered 2021-07-11: 40 mg via ORAL
  Filled 2021-07-11: qty 2

## 2021-07-11 MED ORDER — IPRATROPIUM-ALBUTEROL 0.5-2.5 (3) MG/3ML IN SOLN
3.0000 mL | Freq: Once | RESPIRATORY_TRACT | Status: AC
  Administered 2021-07-11: 3 mL via RESPIRATORY_TRACT
  Filled 2021-07-11: qty 3

## 2021-07-11 MED ORDER — ALBUTEROL SULFATE HFA 108 (90 BASE) MCG/ACT IN AERS: 2.0000 | INHALATION_SPRAY | RESPIRATORY_TRACT | 0 refills | Status: DC | PRN

## 2021-07-11 MED ORDER — IOHEXOL 350 MG/ML SOLN
75.0000 mL | Freq: Once | INTRAVENOUS | Status: AC | PRN
  Administered 2021-07-11: 75 mL via INTRAVENOUS

## 2021-07-11 MED ORDER — PREDNISONE 10 MG PO TABS: ORAL_TABLET | ORAL | 0 refills | Status: DC

## 2021-07-11 NOTE — Telephone Encounter (Signed)
Sent patient to the emergency department to rule out PE

## 2021-07-11 NOTE — Telephone Encounter (Signed)
Patient is seeing Marrianne Mood, PA this afternoon at 3 pm

## 2021-07-11 NOTE — Patient Instructions (Signed)
Medication Instructions:  Your physician has recommended you make the following change in your medication:   -HOLD Spironolactone for 3 days.  -INCREASE Lasix to 60 mg in the am and 40 mg in the pm for 3 days.  -One day 4 resume your normally dosage for both medications.  *If you need a refill on your cardiac medications before your next appointment, please call your pharmacy*   Lab Work: Tsh, Free T4, Bnp, Bmp, stat D-dimer today.  Please have your labs drawn at the [facility]. Stop at the registration desk to check in.  If you have labs (blood work) drawn today and your tests are completely normal, you will receive your results only by: [place] (if you have MyChart) OR A paper copy in the mail If you have any lab test that is abnormal or we need to change your treatment, we will call you to review the results.   Testing/Procedures: None ordered   Follow-Up: At [facility], you and your health needs are our priority.  As part of our continuing mission to provide you with exceptional heart care, we have created designated Provider Care Teams.  These Care Teams include your primary Cardiologist (physician) and Advanced Practice Providers (APPs -  Physician Assistants and Nurse Practitioners) who all work together to provide you with the care you need, when you need it.  We recommend signing up for the patient portal called "MyChart".  Sign up information is provided on this After Visit Summary.  MyChart is used to connect with patients for Virtual Visits (Telemedicine).  Patients are able to view lab/test results, encounter notes, upcoming appointments, etc.  Non-urgent messages can be sent to your provider as well.   To learn more about what you can do with MyChart, go to [email or web address].    Your next appointment:   Follow up as placed with the [facility] Dr. Aundra Dubin on 07/20/21.  The format for your next appointment:   In Person  Provider:   Dr.  Aundra Dubin   Other Instructions  Please call or mychart the last 7-10 days blood pressure readings and your weights.

## 2021-07-11 NOTE — ED Triage Notes (Signed)
Pt to ED for elevated d dimer, wife states desat to 38s while on 4L [place].  Hx pulmonary fibrosis and HTN Denies pain

## 2021-07-11 NOTE — Telephone Encounter (Signed)
Patient's wife states patient passed out last Friday.  Pt c/o Shortness Of Breath: STAT if SOB developed within the last 24 hours or pt is noticeably SOB on the phone  1. Are you currently SOB (can you hear that pt is SOB on the phone)? Yes, patient is on 4 Liters of oxygen  2. How long have you been experiencing SOB?   3. Are you SOB when sitting or when up moving around? both  4. Are you currently experiencing any other symptoms? Patient passed out last Friday, states he was unable to get his breath.

## 2021-07-11 NOTE — Telephone Encounter (Signed)
Reviewed the patient's chart with Marrianne Mood, PA to see if there was anything I needed to inquire of the patient prior to his follow up appointment today at 3:00 pm with her.  Secure chat received from [place] stating: Looks like they recently increased his lasix but never got the repeat BMET. Can we get that before I see him!?!? (and tack on a BNP if not too much trouble)  I called and spoke with the patient and advised Jaquelyn wanted to try to get some repeat lab work on his prior to his appointment this afternoon. He advised he would not be able to come any sooner than 3:00 pm as he is waiting on his wife to come with him.  He states he just had lab work last week. He did have a BMET on 07/07/21. I advised the patient I will notify Jacquelyn and we will try to gain any additional lab work when he is in the clinic this afternoon.  The patient voices understanding and is agreeable.

## 2021-07-11 NOTE — Telephone Encounter (Signed)
Patient's spouse, [name] is aware of below recommendations and voiced her understanding.  CTHR results as noted below have been relayed to [place]. Patient is scheduled to see cardiology next week. [name] voiced her understanding.  Nothing further needed at this time.   Please let patient know HRCT showed no progressions of his pulmonary fibrosis. No suspicious nodules or masses. He has moderate emphysema. Incidental findings atherosclerosis, 3 vessel coronary artery disease and mild cardiomegaly. Please order to cardiology for ongoing dyspnea and CAD

## 2021-07-11 NOTE — ED Provider Notes (Signed)
[facility] Emergency Department Provider Note    Event Date/Time   First MD Initiated Contact with Patient 07/11/21 1944     (approximate)  I have reviewed the triage vital signs and the nursing notes.   HISTORY  Chief Complaint Shortness of Breath    HPI Caleb Coppola Legan Sr. is a 87 y.o. male the below listed past medical history presents to the ER for evaluation of elevated D-dimer that was ordered from cardiology clinic this morning.  Patient states that he is roughly at his baseline states that over the past several weeks to months he has had slight progression in his dyspnea.  He is not on anticoagulation.  He denies any chest pain.  Does not note any significant lower extremity swelling or orthopnea.  States that he feels fine when he is at rest but is having worsening exertional dyspnea.  He completed a course of prednisone a few weeks ago with significant improvement but feel like after stopping that it is returned.  Denies any pleuritic pain.  No nausea or vomiting.  Past Medical History:  Diagnosis Date   Anxiety    Atrial flutter ([place])    "just dx'd today" (11/04/2013)   Benign prostatic hypertrophy    Borderline diabetes    CHF (congestive heart failure) (HCC)    Colon, diverticulosis    COPD (chronic obstructive pulmonary disease) (HCC)    Dysrhythmia    Exertional shortness of breath    Gout    Hyperlipidemia    Hypertension    Osteoarthritis    "all over" (11/04/2013)   PVD (peripheral vascular disease) ([place])    Family History  Problem Relation Age of Onset   Coronary artery disease Sister    Heart failure Mother    Past Surgical History:  Procedure Laterality Date   albation  11/04/13   ANKLE FRACTURE SURGERY Right    APPENDECTOMY  1997   "days before 1st colon OR"   ATRIAL FLUTTER ABLATION N/A 11/26/2013   Procedure: ATRIAL FLUTTER ABLATION;  Surgeon: Evans Lance, MD;  Location: [facility] CATH LAB;  Service: Cardiovascular;   Laterality: N/A;   CATARACT EXTRACTION, BILATERAL Bilateral    CHOLECYSTECTOMY  12/03   COLON SURGERY     "diverticulitis"   COLONOSCOPY WITH ESOPHAGOGASTRODUODENOSCOPY (EGD)  12/00   gastic polyp    COLONOSCOPY WITH ESOPHAGOGASTRODUODENOSCOPY (EGD)  11/07,12/2011   gastritis, 2 gastic polyps   COLOSTOMY  1997   COLOSTOMY REVERSAL     "think I wore it 4 months" (11/04/2013)   CORNEAL TRANSPLANT Right    CORONARY ANGIOGRAM  11/05/2013   Procedure: CORONARY ANGIOGRAM;  Surgeon: Wellington Hampshire, MD;  Location: [facility] CATH LAB;  Service: Cardiovascular;;   ESOPHAGOGASTRODUODENOSCOPY (EGD) WITH PROPOFOL N/A 08/13/2019   Procedure: ESOPHAGOGASTRODUODENOSCOPY (EGD) WITH PROPOFOL;  Surgeon: Robert Bellow, MD;  Location: [facility] ENDOSCOPY;  Service: Endoscopy;  Laterality: N/A;   HERNIA REPAIR  11/05/14   ventral hernia   INGUINAL HERNIA REPAIR Left    PARTIAL COLECTOMY  1997   "diverticulitis; busted; [place]"   RIGHT/LEFT HEART CATH AND CORONARY ANGIOGRAPHY Bilateral 12/17/2020   Procedure: RIGHT/LEFT HEART CATH AND CORONARY ANGIOGRAPHY;  Surgeon: Wellington Hampshire, MD;  Location: [facility] CV LAB;  Service: Cardiovascular;  Laterality: Bilateral;   stress cardiolite negative  9/02   EF 60%   TONSILLECTOMY AND ADENOIDECTOMY     child   VENTRAL HERNIA REPAIR  11/07   [place]   Patient Active Problem  List   Diagnosis Date Noted   Pulmonary HTN ([place]) 85/46/2703   Acute systolic heart failure ([place])    Hypoxia 08/20/2020   CKD (chronic kidney disease), stage II    Acute respiratory failure with hypoxia (HCC)    Stage 3a chronic kidney disease (HCC)    Hypokalemia    Type 2 diabetes mellitus with hyperlipidemia (HCC)    GERD (gastroesophageal reflux disease) 10/17/2016   CKD stage 3 due to type 2 diabetes mellitus ([place]) 10/04/2016   Idiopathic pulmonary fibrosis ([place]) 02/25/2016   Thoracic aortic aneurysm without rupture (HCC) 02/01/2016   Chronic cough 01/25/2016   Chronic obstructive  pulmonary disease ([place]) 01/25/2016   Midline thoracic back pain 10/19/2015   Advanced directives, counseling/discussion 10/14/2015   History of atrial flutter 12/21/2014   Ventral hernia without obstruction or gangrene 50/06/3817   Chronic systolic CHF (congestive heart failure) ([place]) 12/23/2013   Intermediate coronary syndrome ([place]) 11/04/2013   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   DDD (degenerative disc disease), cervical 03/31/2013   Fuchs' corneal dystrophy 02/19/2013   Neuropathy due to secondary diabetes mellitus ([place]) 12/31/2012   Gout, chronic, 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   DM (diabetes mellitus) with peripheral vascular complication ([place]) 29/93/7169   Shoulder pain, bilateral 11/06/2011   Vitamin B12 deficiency 03/11/2009   INTERMITTENT VERTIGO 03/11/2009   Mild dementia ([place]) 09/01/2008   HYPERCHOLESTEROLEMIA 11/29/2007   ALLERGIC RHINITIS 11/29/2007   Essential hypertension 11/14/2007   OSTEOARTHRITIS 06/13/2007   Peripheral vascular disease due to secondary diabetes mellitus ([place]) 06/12/2007   DIVERTICULOSIS, COLON 06/12/2007   BENIGN PROSTATIC HYPERTROPHY 06/12/2007      Prior to Admission medications   Medication Sig Start Date End Date Taking? Authorizing Provider  albuterol (VENTOLIN HFA) 108 (90 Base) MCG/ACT inhaler Inhale 2 puffs into the lungs every 4 (four) hours as needed for shortness of breath or wheezing. 07/11/21   Merlyn Lot, MD  allopurinol (ZYLOPRIM) 100 MG tablet TAKE 1 TABLET(100 MG) BY MOUTH DAILY 04/10/21   Diona Browner, Amy E, MD  aspirin 81 MG chewable tablet Chew 81 mg by mouth daily.    [provider]  atorvastatin (LIPITOR) 20 MG tablet TAKE 1 TABLET BY MOUTH DAILY 04/22/21   Bedsole, Amy E, MD  Budeson-Glycopyrrol-Formoterol (BREZTRI AEROSPHERE) 160-9-4.8 MCG/ACT AERO Inhale 2 puffs into the lungs in the morning and at bedtime. 04/18/21   Tyler Pita, MD  Budeson-Glycopyrrol-Formoterol (BREZTRI AEROSPHERE) 160-9-4.8 MCG/ACT AERO  Inhale 2 puffs into the lungs in the morning and at bedtime. 04/18/21   Tyler Pita, MD  Cyanocobalamin (B-12 PO) Take 1,000 mcg by mouth daily.     [provider]  empagliflozin ([name]) 10 MG TABS tablet Take 1 tablet (10 mg total) by mouth daily before breakfast. 01/25/21   Larey Dresser, MD  furosemide (LASIX) 40 MG tablet Take 1.5 tablets (60 mg total) by mouth in the morning AND 1 tablet (40 mg total) every other day. In PM. 06/23/21   Larey Dresser, MD  metoprolol succinate (TOPROL-XL) 25 MG 24 hr tablet TAKE 1 TABLET(25 MG) BY MOUTH DAILY 05/26/21   Wellington Hampshire, MD  potassium chloride SA (KLOR-CON) 20 MEQ tablet Take 1 tablet (20 mEq total) by mouth daily. 02/02/21   Larey Dresser, MD  predniSONE (DELTASONE) 10 MG tablet Take 4 tabs po daily x 3 days; then 3 tabs daily x3 days; then 2 tabs daily x3 days; then 1 tab daily x 3 days; then stop 07/11/21   Merlyn Lot,  MD  pregabalin (LYRICA) 50 MG capsule 1 tablet at night, if toleraing but still pain increase  50  mg weekly until effect and max 150 mg at night. 03/04/21   Bedsole, Amy E, MD  spironolactone (ALDACTONE) 25 MG tablet TAKE 1/2 TABLET(12.5 MG) BY MOUTH DAILY 05/12/21   Larey Dresser, MD  Treprostinil (TYVASO) 0.6 MG/ML SOLN Inhale 18 mcg into the lungs in the morning, at noon, in the evening, and at bedtime. 12/31/20   Larey Dresser, MD    Allergies Codeine    Social History Social History   Tobacco Use   Smoking status: Former    Packs/day: 3.00    Years: 30.00    Pack years: 90.00    Types: Cigarettes    Quit date: 1980    Years since quitting: 42.7   Smokeless tobacco: Former    Types: Chew   Tobacco comments:    11/04/2013 "quit smoking in the 1980's or so; quit chewing before I quit smoking"  Vaping Use   Vaping Use: Never used  Substance Use Topics   Alcohol use: No    Comment: 11/04/2013 "quit driking in 1980's; was called a weekend alcoholic"   Drug use: No    Review of  Systems Patient denies headaches, rhinorrhea, blurry vision, numbness, shortness of breath, chest pain, edema, cough, abdominal pain, nausea, vomiting, diarrhea, dysuria, fevers, rashes or hallucinations unless otherwise stated above in HPI. ____________________________________________   PHYSICAL EXAM:  VITAL SIGNS: Vitals:   07/11/21 1752 07/11/21 1754  BP: 106/73   Pulse: 76   Resp: (!) 22   Temp: 98.4 F (36.9 C)   SpO2:  91%    Constitutional: Alert and oriented.  Eyes: Conjunctivae are normal.  Head: Atraumatic. Nose: No congestion/rhinnorhea. Mouth/Throat: Mucous membranes are moist.   Neck: No stridor. Painless ROM.  Cardiovascular: Normal rate, regular rhythm. Grossly normal heart sounds.  Good peripheral circulation. Respiratory: Normal respiratory effort.  No retractions. Lungs with diminished bs throughout,  speaking in complete sentences Gastrointestinal: Soft and nontender. No distention. No abdominal bruits. No CVA tenderness. Genitourinary:  Musculoskeletal: No lower extremity tenderness nor edema.  No joint effusions. Neurologic:  Normal speech and language. No gross focal neurologic deficits are appreciated. No facial droop Skin:  Skin is warm, dry and intact. No rash noted. Psychiatric: Mood and affect are normal. Speech and behavior are normal.  ____________________________________________   LABS (all labs ordered are listed, but only abnormal results are displayed)  Results for orders placed or performed during the hospital encounter of 07/11/21 (from the past 24 hour(s))  CBC     Status: None   Collection Time: 07/11/21  5:58 PM  Result Value Ref Range   WBC 10.5 4.0 - 10.5 K/uL   RBC 4.34 4.22 - 5.81 MIL/uL   Hemoglobin 14.7 13.0 - 17.0 g/dL   HCT 42.7 39.0 - 52.0 %   MCV 98.4 80.0 - 100.0 fL   MCH 33.9 26.0 - 34.0 pg   MCHC 34.4 30.0 - 36.0 g/dL   RDW 12.9 11.5 - 15.5 %   Platelets 281 150 - 400 K/uL   nRBC 0.0 0.0 - 0.2 %  Basic metabolic  panel     Status: Abnormal   Collection Time: 07/11/21  5:58 PM  Result Value Ref Range   Sodium 139 135 - 145 mmol/L   Potassium 3.7 3.5 - 5.1 mmol/L   Chloride 99 98 - 111 mmol/L   CO2 29 22 - 32  mmol/L   Glucose, Bld 118 (H) 70 - 99 mg/dL   BUN 29 (H) 8 - 23 mg/dL   Creatinine, Ser 1.70 (H) 0.61 - 1.24 mg/dL   Calcium 8.9 8.9 - 10.3 mg/dL   GFR, Estimated 39 (L) >60 mL/min   Anion gap 11 5 - 15   ____________________________________________  EKG My review and personal interpretation at Time: 18:06   Indication: sob  Rate: 95  Rhythm: sinus with pac Axis: left Other: nonspecific st and t wave abn ____________________________________________  RADIOLOGY  I personally reviewed all radiographic images ordered to evaluate for the above acute complaints and reviewed radiology reports and findings.  These findings were personally discussed with the patient.  Please see medical record for radiology report.  ____________________________________________   PROCEDURES  Procedure(s) performed:  Procedures    Critical Care performed: no ____________________________________________   INITIAL IMPRESSION / ASSESSMENT AND PLAN / ED COURSE  Pertinent labs & imaging results that were available during my care of the patient were reviewed by me and considered in my medical decision making (see chart for details).   DDX: COPD, emphysema, fibrosis, pneumonia, CHF, PE  Ryoma Nofziger Knightly Sr. is a 87 y.o. who presents to the ED with his as described above and abnormal lab from clinic.  CTA was ordered to rule out PE.  There is no sign of PE.  Does have chronic pulmonary fibrosis no evidence of significant CHF or edema.  His BNP from earlier today is roughly at baseline he is not describing any significant edema orthopnea.  He is satting in the mid 90s and comfortable on his chronic 4 L.  States that he would not have come to the ER had not been told to because he is otherwise feeling at his  baseline.  Discussed option for observation admission to the hospital for further respiratory treatments as well as steroid as suspect this is more likely worsening pulmonary fibrosis.  Patient states that he does not want to be admitted to the hospital and states that he felt much better after course of prednisone.  This does not seem consistent with ACS.  He is not showing signs of sepsis.  As the patient is declining admission to the hospital we will treat with prednisone taper he has nebulizers at home.  We discussed strict return precautions and patient agreeable plan.  Conversation and encounter witnessed with family member present in the room who is agreement with plan.     The patient was evaluated in Emergency Department today for the symptoms described in the history of present illness. He/she was evaluated in the context of the global COVID-19 pandemic, which necessitated consideration that the patient might be at risk for infection with the SARS-CoV-2 virus that causes COVID-19. Institutional protocols and algorithms that pertain to the evaluation of patients at risk for COVID-19 are in a state of rapid change based on information released by regulatory bodies including the CDC and federal and state organizations. These policies and algorithms were followed during the patient's care in the ED.  As part of my medical decision making, I reviewed the following data within the [place] notes reviewed and incorporated, Labs reviewed, notes from prior ED visits and  Controlled Substance Database   ____________________________________________   FINAL CLINICAL IMPRESSION(S) / ED DIAGNOSES  Final diagnoses:  Pulmonary fibrosis ([place])      NEW MEDICATIONS STARTED DURING THIS VISIT:  Current Discharge Medication List  Note:  This document was prepared using Dragon voice recognition software and may include unintentional dictation errors.    Merlyn Lot, MD 07/11/21 2041

## 2021-07-11 NOTE — Telephone Encounter (Signed)
He needs to be evaluated in the ED.  He has already had 2 rounds of prednisone without improvement.  This may be related more to his heart than to his lungs.  More prednisone is not going to help this.  He is in danger of death if he does not go to the emergency room as instructed.

## 2021-07-11 NOTE — Progress Notes (Signed)
Office Visit    Patient Name: [facility] Date of Encounter: 07/11/2021  PCP:  [name], MD   [place]  Cardiologist:  Dr. Fletcher Anon Advanced Practice Provider:  No care team member to display Electrophysiologist:  None :[phone]}   Chief Complaint    Chief Complaint  Patient presents with   Other    6 month f/u syncope Friday and sob. Meds reviewed verbally with pt.    87 y.o. male with history of atrial flutter, pulmonary fibrosis, chronic systolic heart failure, pulmonary hypertension, and here today for shortness of breath and syncope.  Past Medical History    Past Medical History:  Diagnosis Date   Anxiety    Atrial flutter ([place])    "just dx'd today" (11/04/2013)   Benign prostatic hypertrophy    Borderline diabetes    CHF (congestive heart failure) (HCC)    Colon, diverticulosis    COPD (chronic obstructive pulmonary disease) (HCC)    Dysrhythmia    Exertional shortness of breath    Gout    Hyperlipidemia    Hypertension    Osteoarthritis    "all over" (11/04/2013)   PVD (peripheral vascular disease) ([place])    Past Surgical History:  Procedure Laterality Date   albation  11/04/13   ANKLE FRACTURE SURGERY Right    APPENDECTOMY  1997   "days before 1st colon OR"   ATRIAL FLUTTER ABLATION N/A 11/26/2013   Procedure: ATRIAL FLUTTER ABLATION;  Surgeon: Evans Lance, MD;  Location: [facility] CATH LAB;  Service: Cardiovascular;  Laterality: N/A;   CATARACT EXTRACTION, BILATERAL Bilateral    CHOLECYSTECTOMY  12/03   COLON SURGERY     "diverticulitis"   COLONOSCOPY WITH ESOPHAGOGASTRODUODENOSCOPY (EGD)  12/00   gastic polyp    COLONOSCOPY WITH ESOPHAGOGASTRODUODENOSCOPY (EGD)  11/07,12/2011   gastritis, 2 gastic polyps   COLOSTOMY  1997   COLOSTOMY REVERSAL     "think I wore it 4 months" (11/04/2013)   CORNEAL TRANSPLANT Right    CORONARY ANGIOGRAM  11/05/2013   Procedure: CORONARY ANGIOGRAM;  Surgeon: Wellington Hampshire, MD;   Location: [facility] CATH LAB;  Service: Cardiovascular;;   ESOPHAGOGASTRODUODENOSCOPY (EGD) WITH PROPOFOL N/A 08/13/2019   Procedure: ESOPHAGOGASTRODUODENOSCOPY (EGD) WITH PROPOFOL;  Surgeon: Robert Bellow, MD;  Location: [facility] ENDOSCOPY;  Service: Endoscopy;  Laterality: N/A;   HERNIA REPAIR  11/05/14   ventral hernia   INGUINAL HERNIA REPAIR Left    PARTIAL COLECTOMY  1997   "diverticulitis; busted; [place]"   RIGHT/LEFT HEART CATH AND CORONARY ANGIOGRAPHY Bilateral 12/17/2020   Procedure: RIGHT/LEFT HEART CATH AND CORONARY ANGIOGRAPHY;  Surgeon: Wellington Hampshire, MD;  Location: [place] CV LAB;  Service: Cardiovascular;  Laterality: Bilateral;   stress cardiolite negative  9/02   EF 60%   TONSILLECTOMY AND ADENOIDECTOMY     child   VENTRAL HERNIA REPAIR  11/07   [place]    Allergies  Allergies  Allergen Reactions   Codeine Nausea And Vomiting    History of Present Illness    Alando Colleran Sr. is a 87 y.o. male with PMH as above.  He is a retired Civil engineer, contracting, married, and lives in [place].  He enjoys fishing.  He has a remote history of tobacco use.  No alcohol.  He is followed by [facility] cardiology in the [facility] office and recently was referred to the advanced heart failure clinic for pulmonary hypertension/advanced heart failure.  He has history  of pulmonary fibrosis since 2013, attributed to working in a [employer] and exposure to asbestos.  Last 09/2020 CT showed progressive fibrosis and honeycombing.  In 1/15, he was noted to be in atrial flutter.  EF 25 to 30%.  Cath showed minimal CAD.  He had an ablation 2/15 with anticoagulation later discontinued.  Echo 8/16 showed EF normal.  In 07/2020, he had COVID-19 pneumonia.  He feels as if he never has recovered from this.  Prior to 07/2020, he was short of breath after weed eating for about 15 minutes.  After 07/2020, he has had progressive dyspnea and required home oxygen.  Echo 11/2020 performed for  worsening symptoms showed EF 25 to 30%, moderate RV dysfunction.    [facility] 11/2020 with mild luminal irregularities, elevated right heart filling pressure, severe pulmonary arterial hypertension, normal PCWP.    Cardiac MRI 12/2020 showed EF 28%, diffuse hypokinesis, severe RV dilation with akinetic mid to apical RV wall, RV EF 30%, D-shaped septum, nonspecific RV insertion site LGE.   VQ scan 01/2021 without chronic or acute PE.    He was last seen 05/11/2021 by Dr. Aundra Dubin.  He was on Tyvaso with main report of coughing and difficulty transporting the inhaler with him.  He was using 3 L oxygen.  He did not tolerate his CPAP.  He was more winded lately.  He was still able to walk into church and around the house without dyspnea but became short of breath with longer walks.  SBP generally 90s to 100s.  No lightheadedness.  His weight was up, but he had been eating better than in the past.  Seen 8/27 for syncope.  He had reportedly passed out twice with BP 64/44.  He did not hit his head.  Entresto was discontinued and Lasix held for 2 days.  04/2021 monitor showed NSR, bundle branch block/IVCD, SVT versus possible atrial tachycardia with variable block.  PVCs and PACs noted.  A second monitor was performed with similar findings of NSR, SVT, ectopy.  On 8/23, he called the office with worsening symptoms and recommendation was to take Lasix 60 mg every a.m. and 40 every p.m. for 3 days, then Lasix 60 every a.m. and 40 every other p.m. after that.  Repeat BMET recommended in 1 week.  Subsequent labs showed stable renal function and electrolytes at goal.  He recently had 07/07/2021 CT without contrast that showed ILD stable from previous studies, moderate central global and paraseptal emphysema, aortic atherosclerosis, 3B CAD, calcification of the aortic and mitral valves, and mild cardiomegaly.  On 07/11/2021, the patient's wife called the pulmonary office after the patient passed out on 07/08/2021 after cutting  grass.  Per the patient's wife, he was not evaluated immediately after passing out.  He reported shortness of breath and dry cough.  No fever, chills, sweats, or additional symptoms.  SPO2 60% on 4 L nasal cannula with exertion and 64% on 4 L nasal cannula oxygen with rest.  Recommendation was to present to the emergency department via EMS.  Patient declined with request for prednisone.  Today, 07/11/2021, the patient evaluates that he had 2 episodes of syncope on Friday.  He also references passing out 3 weeks earlier.  Over the last 24 hours, his shortness of breath has increased, despite use of 4 L nasal cannula oxygen.  He feels as if he is unable to get his breath.  No chest pain.  No tachypalpitations.  He did not hit his head when passing out.  He denies any current presyncope.  He denies any swelling.  He does report some abdominal distention.  He denies orthopnea, PND.  We discussed that the  recommendation is to go to the emergency department with patient politely declining this recommendation.  Stat labs were checked, including a D-dimer, which subsequently came back elevated.  Per patient's wife, his weight has varied every day and he has recently been eating less.  His weight will increase within 2 days and then go back down.  She wonders about holding onto fluid.  Home Medications   Current Outpatient Medications  Medication Instructions   albuterol (VENTOLIN HFA) 108 (90 Base) MCG/ACT inhaler 2 puffs, Inhalation, Every 4 hours PRN   allopurinol (ZYLOPRIM) 100 MG tablet TAKE 1 TABLET(100 MG) BY MOUTH DAILY   aspirin 81 mg, Oral, Daily   atorvastatin (LIPITOR) 20 MG tablet TAKE 1 TABLET BY MOUTH DAILY   Budeson-Glycopyrrol-Formoterol (BREZTRI AEROSPHERE) 160-9-4.8 MCG/ACT AERO 2 puffs, Inhalation, 2 times daily   Budeson-Glycopyrrol-Formoterol (BREZTRI AEROSPHERE) 160-9-4.8 MCG/ACT AERO 2 puffs, Inhalation, 2 times daily   Cyanocobalamin (B-12 PO) 1,000 mcg, Oral, Daily   empagliflozin  ([name]) 10 mg, Oral, Daily before breakfast   furosemide (LASIX) 40 MG tablet Take 1.5 tablets (60 mg total) by mouth in the morning AND 1 tablet (40 mg total) every other day. In PM.   metoprolol succinate (TOPROL-XL) 25 MG 24 hr tablet TAKE 1 TABLET(25 MG) BY MOUTH DAILY   potassium chloride SA (KLOR-CON) 20 MEQ tablet 20 mEq, Oral, Daily   predniSONE (DELTASONE) 10 MG tablet Take 4 tabs po daily x 3 days; then 3 tabs daily x3 days; then 2 tabs daily x3 days; then 1 tab daily x 3 days; then stop   pregabalin (LYRICA) 50 MG capsule 1 tablet at night, if toleraing but still pain increase  50  mg weekly until effect and max 150 mg at night.   spironolactone (ALDACTONE) 25 MG tablet TAKE 1/2 TABLET(12.5 MG) BY MOUTH DAILY   Tyvaso 18 mcg, Inhalation, 4 times daily     Review of Systems    He reports significant shortness of breath and dyspnea.  He has had multiple episodes of syncope with 2 episodes 07/08/2021.  He reports low oxygen saturation. He denies chest pain, palpitations, pnd, orthopnea, n, v, edema.he reports variable weights and intermittent abdominal distention.  Per wife, he has a low appetite.  All other systems reviewed and are otherwise negative except as noted above.  Physical Exam    VS:  BP 108/60 (BP Location: Left Arm, Patient Position: Sitting, Cuff Size: Normal)   Pulse 84   Ht _0  (1.626 m)   Wt 168 lb (76.2 kg)   SpO2 (!) 74%   BMI 28.84 kg/m  , BMI Body mass index is 28.84 kg/m. GEN: Elderly male, in no acute distress.  Joined by his wife. HEENT: normal.  Nasal cannula oxygen in place. Neck: Supple, JVP approximately 11 cm.  No carotid bruits, or masses. Cardiac: Regular with frequent extrasystole, 1/6 systolic murmur in RUSB, rubs, or gallops. No clubbing, cyanosis.  Mild bilateral lower extremity edema.  Radials/DP/PT 2+ and equal bilaterally.  Respiratory: Dry Crackles at the bases. GI: Soft, nontender, nondistended, BS + x 4. MS: no deformity or  atrophy. Skin: warm and dry, no rash. Neuro:  Strength and sensation are intact. Psych: Normal affect.  Accessory Clinical Findings    ECG personally reviewed by me today sinus rhythm with sinus arrhythmia, RBBB, RVH, 84 bpm,  QRS 136 ms, QTC 477 ms, T wave inversion noted in V1 through V4, 3, aVF- no acute changes.  VITALS Reviewed today   Temp Readings from Last 3 Encounters:  06/15/21 97.9 F (36.6 C) (Oral)  04/18/21 (!) 97.5 F (36.4 C) (Temporal)  03/04/21 (!) 96.9 F (36.1 C) (Temporal)   BP Readings from Last 3 Encounters:  07/11/21 108/60  06/15/21 98/70  05/25/21 102/70   Pulse Readings from Last 3 Encounters:  07/11/21 84  06/15/21 83  05/25/21 60    Wt Readings from Last 3 Encounters:  07/11/21 168 lb (76.2 kg)  06/15/21 168 lb 6.4 oz (76.4 kg)  05/25/21 171 lb (77.6 kg)     LABS  reviewed today   Lab Results  Component Value Date   WBC 11.7 (H) 12/09/2020   HGB 14.2 12/09/2020   HCT 41.0 12/09/2020   MCV 95 12/09/2020   PLT 272 12/09/2020   Lab Results  Component Value Date   CREATININE 1.61 (H) 07/07/2021   BUN 24 (H) 07/07/2021   NA 138 07/07/2021   K 3.9 07/07/2021   CL 99 07/07/2021   CO2 31 07/07/2021   Lab Results  Component Value Date   ALT 17 02/25/2021   AST 25 02/25/2021   ALKPHOS 80 02/25/2021   BILITOT 1.0 02/25/2021   Lab Results  Component Value Date   CHOL 111 02/25/2021   HDL 51.40 02/25/2021   LDLCALC 48 02/25/2021   TRIG 55.0 02/25/2021   CHOLHDL 2 02/25/2021    Lab Results  Component Value Date   HGBA1C 6.5 02/25/2021   Lab Results  Component Value Date   TSH 1.56 10/05/2020     STUDIES/PROCEDURES reviewed today   Cardiac monitoring 05/25/2021 Patch Wear Time:  14 days and 22 hours (2022-07-27T12:10:57-0400 to 2022-08-15T21:44:25-0400) Monitor 1 Patient had a min HR of 52 bpm, max HR of 218 bpm, and avg HR of 86 bpm. Predominant underlying rhythm was Sinus Rhythm. Bundle Branch Block/IVCD was present. 4  Supraventricular Tachycardia runs occurred, the run with the fastest interval lasting 15.2  secs with a max rate of 218 bpm (avg 133 bpm); the run with the fastest interval was also the longest. Some episodes of Supraventricular Tachycardia may be possible Atrial Tachycardia with variable block. Isolated SVEs were occasional (1.5%, 2689), SVE  Couplets were rare (<1.0%, 364), and SVE Triplets were rare (<1.0%, 1). Isolated VEs were rare (<1.0%), VE Couplets were rare (<1.0%), and no VE Triplets were present. Ventricular Bigeminy was present. Monitor 2 Patient had a min HR of 49 bpm, max HR of 231 bpm, and avg HR of 81 bpm. Predominant underlying rhythm was Sinus Rhythm. 91 Supraventricular Tachycardia runs occurred, the run with the fastest interval lasting 4 beats with a max rate of 231 bpm, the  longest lasting 14 beats with an avg rate of 167 bpm. Isolated SVEs were occasional (2.2%, 32293), SVE Couplets were rare (<1.0%, 2109), and SVE Triplets were rare (<1.0%, 410). Isolated VEs were occasional (1.7%, 25626), VE Couplets were rare (<1.0%,  1612), and VE Triplets were rare (<1.0%, 666). Ventricular Bigeminy and Trigeminy were present.   [facility] 12/17/2020 Ost RCA to Prox RCA lesion is 20% stenosed. Prox RCA lesion is 30% stenosed. Mid RCA lesion is 20% stenosed. 1.  Mild nonobstructive coronary artery disease. 2.  Left ventricular angiography was not performed. EF was 25 to 30% by echo. 3. Right heart catheterization showed normal left-sided filling pressures, severe pulmonary hypertension and mildly reduced  cardiac output. RA: 15/22 with a mean of 17 mmHg RV: 70 over 9 mmHg PCW: 13 mmHg PA: 70/27 with a mean of 44 mmHg LVEDP: 13 mmHg. PA sat was 65.7% and aortic sat was 96.5%. Cardiac output was 3.82 with a cardiac index of 2.13.  Pulmonary vascular resistance was 8.12 Woods unit. Recommendations: Recommend medical therapy for nonischemic cardiomyopathy. The patient has severe pulmonary  hypertension which is not due to his heart failure as his left-sided filling pressures are normal.  I am going to refer the patient to our advanced heart failure clinic to manage this complex patient.  Echo 11/30/2020 Left ventricular ejection fraction, by estimation, is 25 to 30%. The  left ventricle has severely decreased function. The left ventricle  demonstrates global hypokinesis. There is mild left ventricular  hypertrophy. Left ventricular diastolic parameters   are consistent with Grade I diastolic dysfunction (impaired relaxation).  The average left ventricular global longitudinal strain is -7.3 %. The  global longitudinal strain is abnormal.   2. Right ventricular systolic function is moderately reduced. The right  ventricular size is moderately enlarged. Mildly increased right  ventricular wall thickness. There is severely elevated pulmonary artery  systolic pressure.   3. Right atrial size was mildly dilated.   4. The mitral valve is degenerative. Mild mitral valve regurgitation. No  evidence of mitral stenosis.   5. The tricuspid valve is degenerative. Tricuspid valve regurgitation is  mild to moderate.   6. The aortic valve is tricuspid. There is moderate thickening of the  aortic valve. Aortic valve regurgitation is not visualized. Mild aortic  valve sclerosis is present, with no evidence of aortic valve stenosis.   7. Aortic dilatation noted. There is mild dilatation of the ascending  aorta, measuring 42 mm.   8. Mildly dilated pulmonary artery.   9. The inferior vena cava is normal in size with <50% respiratory  variability, suggesting right atrial pressure of 8 mmHg.  Comparison(s): A prior study was performed on 06/24/2015. The left  ventricular function is significantly worse. The right ventricular  systolic function is worse.   Assessment & Plan    Respiratory distress/hypoxia Syncopal episodes x2 --SPO2 has been in the 60s to 70s since his wife called the office  9/9 with report of syncopal episodes 07/08/2021 as above and since his previous VQ scan on 4/22.  Recommendation at start of visit was for patient to present to the ED to rule out PE and undergo further work-up -ED politely declined.  We discussed all of the possible contributors of his shortness of breath, including acute on chronic systolic heart failure with known pulmonary hypertension / pulmonary fibrosis, as well as history of COVID-19.  Given his level of hypoxia, however, concern was noted for pulmonary embolism, especially given his history of fibrillation/flutter.  On cardiac exam, rhythm irregular.  Discussed that, if in paroxysmal atrial fibrillation, it will be difficult to capture this on an EKG.  Live Zio monitor discussed with patient preference to avoid given cardiac monitoring, though this occurred 7/22.  We did discuss that this monitoring had shown possible atrial tachycardia.  Orthostatics today negative, and while patient does appear volume up, the level of hypoxia remains concerning.  Given patient does not wish to present to the ED, recommended stat labs including a D-dimer with patient advised that, if D-dimer was elevated, recommendation would be he go to the ED.  If labs WNL, would plan for increased diuresis as below with close follow-up.  \  Addendum: Updated labs showed elevated D-dimer with recommendation that the patient present immediately to the emergency department, with wife to drive him).  Recommend CTA to rule out PE, lower extremity ultrasound to rule out DVT, continuous telemetry, stat echo, continuous telemetry monitoring, serial EKGs, repeat / cycle labs including high-sensitivity troponin once in the emergency department.  Acute on chronic systolic heart failure Pulmonary hypertension --Shortness of breath noted with recommendations as above.   He does have a history of nonischemic cardiomyopathy.  [place] 11/2020 with nonobstructive CAD.  It has been suspected that he may  have tachycardia induced cardiomyopathy.  Echo LVEF 25 to 30%, moderate RV dysfunction.  [place] 2/22 with elevated R heart pressures and PAH.  Cardiac MRI 3/22 showed EF 28%, diffuse hypokinesis, severe RV dilation, akinetic mid to apical RV wall and RV EF 30%, D-shaped septum, nonspecific RV insertion site LGE.  RV failure thought likely cor pulmonale and pulmonary fibrosis, PAH.  No evidence for fibrofatty RV infiltration on MRI. Given pt declined ED during today's visit, recommended he obtain STAT labs as detailed above. If labs wnl, plan for increased oral diuresis x3d with Lasix 60 mg in the morning and 40 mg each evening and reassess symptoms.  If D-dimer abnormal, he would need to immediately present to the ED. Addendum: D-dimer elevated -patient advised to immediately present to the ED.  History of atrial flutter s/p ablation --Denies tachypalpitations.  EKG today NSR though rhythm irregular on cardiac exam at a later time.  Previous monitor 7/22 with NSR, SVT, frequent ectopy, possible atrial tachycardia.  Concern noted today for PE given history of atrial flutter s/p atrial flutter ablation 2/15.  Not on anticoagulation and increased risk for thromboembolic event.  Will obtain BMET, BNP, D-dimer, TSH, free T4.   Addendum: Elevated D-dimer, patient advised to immediately present to the ED.  Small ascending aortic aneurysm --As previously noted, likely not a candidate for surgery in the future.  Essential hypertension --BP well controlled.  Continue current medications.  As above, negative for orthostatic hypotension on orthostatics obtained today.  Pulmonary fibrosis --Continue to follow with [name].   Disposition: Addendum to initial disposition: Given his stat labs showed an elevated D-dimer and his hypoxia with recent syncope, patient advised to immediately present to the nearest ED to rule out PE and for further work-up and monitoring.  *Please be aware that the above documentation  was completed voice recognition software and may contain dictation errors.    Arvil Chaco, PA-C 07/11/2021

## 2021-07-11 NOTE — Progress Notes (Signed)
Agree with the details of the visit as noted by Elizabeth Walsh, NP.  C. Laura Shakeera Rightmyer, MD [place] [facility] 

## 2021-07-11 NOTE — Telephone Encounter (Signed)
Elevated D-dimer, patient immediately called and advised to present to the nearest emergency department.   Renal function stable when compared with previous labs.    BNP elevated, though improved from 06/15/2021.  Thyroid labs WNL.  --> Patient will present to the ED.

## 2021-07-11 NOTE — Telephone Encounter (Signed)
Spoke to patient's spouse, [name] [facility]). [name] stated that patient passed out on Friday while cutting grass. Patient was not evaluated after.  C/o increased sob and dry cough. Denied fever, chills, sweats or additional sx.  Spo2 has dropped as low as 60% on 4L with exertion.   During call spo2 was 64% on 4L. Recommended ED via EMS. Patient declined and stated that all he needs his prednisone.   [name], please advise. thanks

## 2021-07-12 ENCOUNTER — Ambulatory Visit: Payer: PPO | Admitting: Physician Assistant

## 2021-07-12 NOTE — Addendum Note (Signed)
Encounter addended by: Micki Riley, RN on: 07/12/2021 10:57 AM  Actions taken: Imaging Exam ended

## 2021-07-15 DIAGNOSIS — Z7984 Long term (current) use of oral hypoglycemic drugs: Secondary | ICD-10-CM | POA: Diagnosis not present

## 2021-07-15 DIAGNOSIS — D692 Other nonthrombocytopenic purpura: Secondary | ICD-10-CM | POA: Diagnosis not present

## 2021-07-15 DIAGNOSIS — Z515 Encounter for palliative care: Secondary | ICD-10-CM | POA: Diagnosis not present

## 2021-07-15 DIAGNOSIS — Z6826 Body mass index (BMI) 26.0-26.9, adult: Secondary | ICD-10-CM | POA: Diagnosis not present

## 2021-07-15 DIAGNOSIS — I110 Hypertensive heart disease with heart failure: Secondary | ICD-10-CM | POA: Diagnosis not present

## 2021-07-15 DIAGNOSIS — I272 Pulmonary hypertension, unspecified: Secondary | ICD-10-CM | POA: Diagnosis not present

## 2021-07-15 DIAGNOSIS — J84112 Idiopathic pulmonary fibrosis: Secondary | ICD-10-CM | POA: Diagnosis not present

## 2021-07-15 DIAGNOSIS — Z66 Do not resuscitate: Secondary | ICD-10-CM | POA: Diagnosis not present

## 2021-07-15 DIAGNOSIS — Z7952 Long term (current) use of systemic steroids: Secondary | ICD-10-CM | POA: Diagnosis not present

## 2021-07-15 DIAGNOSIS — I5022 Chronic systolic (congestive) heart failure: Secondary | ICD-10-CM | POA: Diagnosis not present

## 2021-07-15 DIAGNOSIS — J449 Chronic obstructive pulmonary disease, unspecified: Secondary | ICD-10-CM | POA: Diagnosis not present

## 2021-07-15 DIAGNOSIS — Z7951 Long term (current) use of inhaled steroids: Secondary | ICD-10-CM | POA: Diagnosis not present

## 2021-07-20 ENCOUNTER — Ambulatory Visit (HOSPITAL_COMMUNITY)
Admission: RE | Admit: 2021-07-20 | Discharge: 2021-07-20 | Disposition: A | Discharge status: Home / Self Care | Payer: PPO | Source: Ambulatory Visit | Attending: Cardiology | Admitting: Cardiology

## 2021-07-20 ENCOUNTER — Telehealth: Payer: Self-pay | Admitting: Pulmonary Disease

## 2021-07-20 ENCOUNTER — Encounter (HOSPITAL_COMMUNITY): Payer: Self-pay | Admitting: Cardiology

## 2021-07-20 ENCOUNTER — Other Ambulatory Visit: Payer: Self-pay

## 2021-07-20 VITALS — BP 104/60 | HR 67 | Wt 167.4 lb

## 2021-07-20 DIAGNOSIS — Z8249 Family history of ischemic heart disease and other diseases of the circulatory system: Secondary | ICD-10-CM | POA: Diagnosis not present

## 2021-07-20 DIAGNOSIS — Z8616 Personal history of COVID-19: Secondary | ICD-10-CM | POA: Diagnosis not present

## 2021-07-20 DIAGNOSIS — I5082 Biventricular heart failure: Secondary | ICD-10-CM | POA: Insufficient documentation

## 2021-07-20 DIAGNOSIS — Z7982 Long term (current) use of aspirin: Secondary | ICD-10-CM | POA: Diagnosis not present

## 2021-07-20 DIAGNOSIS — J439 Emphysema, unspecified: Secondary | ICD-10-CM | POA: Insufficient documentation

## 2021-07-20 DIAGNOSIS — I5022 Chronic systolic (congestive) heart failure: Secondary | ICD-10-CM | POA: Insufficient documentation

## 2021-07-20 DIAGNOSIS — Z87891 Personal history of nicotine dependence: Secondary | ICD-10-CM | POA: Diagnosis not present

## 2021-07-20 DIAGNOSIS — I2510 Atherosclerotic heart disease of native coronary artery without angina pectoris: Secondary | ICD-10-CM | POA: Diagnosis not present

## 2021-07-20 DIAGNOSIS — I110 Hypertensive heart disease with heart failure: Secondary | ICD-10-CM | POA: Insufficient documentation

## 2021-07-20 DIAGNOSIS — Z79899 Other long term (current) drug therapy: Secondary | ICD-10-CM | POA: Insufficient documentation

## 2021-07-20 DIAGNOSIS — I428 Other cardiomyopathies: Secondary | ICD-10-CM | POA: Diagnosis not present

## 2021-07-20 DIAGNOSIS — J8410 Pulmonary fibrosis, unspecified: Secondary | ICD-10-CM | POA: Diagnosis not present

## 2021-07-20 DIAGNOSIS — I272 Pulmonary hypertension, unspecified: Secondary | ICD-10-CM | POA: Diagnosis not present

## 2021-07-20 DIAGNOSIS — I2721 Secondary pulmonary arterial hypertension: Secondary | ICD-10-CM | POA: Diagnosis not present

## 2021-07-20 LAB — BASIC METABOLIC PANEL
Anion gap: 11 (ref 5–15)
BUN: 25 mg/dL — ABNORMAL HIGH (ref 8–23)
CO2: 32 mmol/L (ref 22–32)
Calcium: 9.1 mg/dL (ref 8.9–10.3)
Chloride: 96 mmol/L — ABNORMAL LOW (ref 98–111)
Creatinine, Ser: 1.51 mg/dL — ABNORMAL HIGH (ref 0.61–1.24)
GFR, Estimated: 45 mL/min — ABNORMAL LOW (ref >60)
Glucose, Bld: 122 mg/dL — ABNORMAL HIGH (ref 70–99)
Potassium: 3.7 mmol/L (ref 3.5–5.1)
Sodium: 139 mmol/L (ref 135–145)

## 2021-07-20 LAB — BRAIN NATRIURETIC PEPTIDE: B Natriuretic Peptide: 246.8 pg/mL — ABNORMAL HIGH (ref 0.0–100.0)

## 2021-07-20 NOTE — Patient Instructions (Addendum)
EKG done today.  Labs done today. We will contact you only if your labs are abnormal.  We will be in contact with you regarding your Tyvaso.  No other medication changes were made. Please continue all current medications as prescribed.  Your physician recommends that you schedule a follow-up appointment in: 2 months  If you have any questions or concerns before your next appointment please send [place] a message through [place] or call our office at [phone].    TO LEAVE A MESSAGE FOR THE NURSE SELECT OPTION 2, PLEASE LEAVE A MESSAGE INCLUDING: YOUR NAME DATE OF BIRTH CALL BACK NUMBER REASON FOR CALL**this is important as we prioritize the call backs  YOU WILL RECEIVE A CALL BACK THE SAME DAY AS LONG AS YOU CALL BEFORE 4:00 PM   Do the following things EVERYDAY: Weigh yourself in the morning before breakfast. Write it down and keep it in a log. Take your medicines as prescribed Eat low salt foods--Limit salt (sodium) to 2000 mg per day.  Stay as active as you can everyday Limit all fluids for the day to less than 2 liters   At the [facility], you and your health needs are our priority. As part of our continuing mission to provide you with exceptional heart care, we have created designated Provider Care Teams. These Care Teams include your primary Cardiologist (physician) and Advanced Practice Providers (APPs- Physician Assistants and Nurse Practitioners) who all work together to provide you with the care you need, when you need it.   You may see any of the following providers on your designated Care Team at your next follow up: Dr Glori Bickers Dr Haynes Kerns, NP Lyda Jester, [place] Audry Riles, PharmD   Please be sure to bring in all your medications bottles to every appointment.

## 2021-07-20 NOTE — Progress Notes (Signed)
I called the patient and he wanted me to call his wife with the results and I had her verify his DOB and she did. She did not have any other questions. The patient has established with a cardiologist. The wife is aware to have him follow up on with cardiology if needed. No other concerns.

## 2021-07-20 NOTE — Telephone Encounter (Signed)
ATC [name]. Unable to leave vm due to mailbox not being setup.  Will call back.

## 2021-07-20 NOTE — Telephone Encounter (Signed)
Caleb Bryant wife is returning phone call. Caleb Bryant phone number is [phone].

## 2021-07-20 NOTE — Telephone Encounter (Signed)
He can have prednisone 10 mg daily however he cannot have higher doses than this on a daily basis as it will be counterproductive to his disease.  Prednisone 10 mg daily okay #30 with 2 refills.

## 2021-07-20 NOTE — Telephone Encounter (Signed)
Called and spoke to patient's spouse, [name]. Patient had recent ED visit and was given prednisone taper. Patient would like to continue on daily prednisone.  [name], please advise. thanks

## 2021-07-20 NOTE — Progress Notes (Signed)
PCP: [name], MD Cardiology: Dr. Fletcher Anon HF Cardiology: Dr. Aundra Dubin  87 y.o. with history of atrial flutter, pulmonary fibrosis, and chronic systolic CHF was referred by Dr. Fletcher Anon for evaluation of CHF and pulmonary hypertension.  Patient has had pulmonary fibrosis since 2013, thought to be related to working in a [employer], had exposure to asbestos.  Last CT chest in 12/21 showed progressive fibrosis and honeycombing.  In 1/15, patient was noted to be in atrial flutter.  Echo at the time showed EF 25-30% and cath showed minimal CAD.  He had atria fibrillation ablation in 2/15 and anticoagulation was later stopped.  Echo in 8/16 showed EF back to normal range.  In 10/21, he had COVID-19 PNA.  He feels like he has never recovered from this.  Prior to 10/21, he was short of breath after weed-eating for about 15 minutes.  After 10/21, progressive dyspnea and required home oxygen.    Echo was done in 2/22 due to worsening symptoms.  This showed EF 25-30% with moderate RV dysfunction.  RHC/LHC was then done in 2/22, showing mild luminal irregularities, elevated right heart filling pressure and severe pulmonary arterial hypertension with normal PCWP.  Cardiac MRI in 3/22 showed EF 28%, diffuse hypokinesis, severe RV dilation with akinetic mid to apical RV wall and RV EF 30%, D-shaped septum, nonspecific RV insertion site LGE.  V/Q scan in 4/22 showed no chronic PE.  High resolution CT chest in 9/22 showed interstitial lung disease consistent with UIP as well as moderate emphysema.  CTA chest in 9/22 showed no PE.  Zio monitor in 9/22 showed PACs, short SVT runs, no atrial fibrillation.   Patient returns for followup of CHF.  He has been more short or breath recently.  Oxygen saturation has been low at times.  He was seen in the ER for dyspnea and was started on prednisone for COPD.  He feels much better on prednisone, currently on a tapering regimen.  He is now able to walk around his house without  exertional dyspnea.  Breathing now back to baseline.  No chest pain. SBP 90s-100s at home with stable weight.  He had to stop Entresto due to hypotension and syncope.      Labs (2/22): K 4, creatinine 1.23 => 1.18, hgb 14.2, BNP 1273 Labs (4/22): LDL 48, K 4.3, creatinine 1.48 Labs (9/22): K 3.7,creatinine 1.7, BNP 775  ECG (personally reviewed): NSR, RBBB, RVH  PMH: 1. Atrial flutter: S/p ablation 2/15.  2. Type 2 diabetes 3. HTN 4. Hyperlipidemia 5. Ascending aortic aneurysm: Small, 41 mm on 2/22 echo.  6. Pulmonary fibrosis: Exposure to environmental toxins including asbestos while working [employer].   Fibrosis was found in 2013.  - CT chest (12/21): Progressive ILD in UIP pattern with honeycombing.  - PFTs (2/22): FVC 66%, FEV1 77%, ratio 80%, TLC 47% => restrictive pattern.  - High resolution CT chest (9/22): interstitial lung disease consistent with UIP as well as moderate emphysema. 7. COVID-19 infection 10/21.  8. COPD: Prior smoker.  - High resolution CT chest in 9/22 showed interstitial lung disease consistent with UIP as well as moderate emphysema.  9. Chronic systolic CHF: Echo in 5/63 with EF 25-30%, thought to be tachy-mediated CMP due to atrial flutter.   - Echo (8/16) with EF normal range.  - Echo (2/22) with EF 25-30%, global hypokinesis, moderately decreased RV systolic function (no atrial arrhythmia).  - RHC/LHC (2/22): Mild nonobstructive CAD; mean RA 17, PA 70/27 mean 44,  mean PCWP 13, CI 2.13, PVR 8.12.  - Cardiac MRI (3/22): EF 28%, diffuse hypokinesis, severe RV dilation with akinetic mid to apical RV wall and RV EF 30%, D-shaped septum, nonspecific RV insertion site LGE. 10. Pulmonary hypertension: RHC (2/22) with mean RA 17, PA 70/27 mean 44, mean PCWP 13, CI 2.13, PVR 8.12. - V/Q scan: No evidence for chronic PE.  11. OSA  SH: Retired Civil engineer, contracting, married, lives in [place].  Enjoys fishing.  Remote smoker, no ETOH.   Family History   Problem Relation Age of Onset   Coronary artery disease Sister    Heart failure Mother    ROS: All systems reviewed and negative except as per HPI.   Current Outpatient Medications  Medication Sig Dispense Refill   albuterol (VENTOLIN HFA) 108 (90 Base) MCG/ACT inhaler Inhale 2 puffs into the lungs every 4 (four) hours as needed for shortness of breath or wheezing. 18 g 0   allopurinol (ZYLOPRIM) 100 MG tablet TAKE 1 TABLET(100 MG) BY MOUTH DAILY 90 tablet 3   aspirin 81 MG chewable tablet Chew 81 mg by mouth daily.     atorvastatin (LIPITOR) 20 MG tablet TAKE 1 TABLET BY MOUTH DAILY 90 tablet 3   Budeson-Glycopyrrol-Formoterol (BREZTRI AEROSPHERE) 160-9-4.8 MCG/ACT AERO Inhale 2 puffs into the lungs in the morning and at bedtime. 10.7 g 11   Cyanocobalamin (B-12 PO) Take 1,000 mcg by mouth daily.      empagliflozin ([name]) 10 MG TABS tablet Take 1 tablet (10 mg total) by mouth daily before breakfast. 30 tablet 6   furosemide (LASIX) 40 MG tablet Take 1.5 tablets (60 mg total) by mouth in the morning AND 1 tablet (40 mg total) every other day. In PM. 45 tablet 11   metoprolol succinate (TOPROL-XL) 25 MG 24 hr tablet TAKE 1 TABLET(25 MG) BY MOUTH DAILY 30 tablet 2   potassium chloride SA (KLOR-CON) 20 MEQ tablet Take 1 tablet (20 mEq total) by mouth daily. 90 tablet 3   predniSONE (DELTASONE) 10 MG tablet Take 4 tabs po daily x 3 days; then 3 tabs daily x3 days; then 2 tabs daily x3 days; then 1 tab daily x 3 days; then stop 30 tablet 0   pregabalin (LYRICA) 50 MG capsule 1 tablet at night, if toleraing but still pain increase  50  mg weekly until effect and max 150 mg at night. 90 capsule 1   spironolactone (ALDACTONE) 25 MG tablet TAKE 1/2 TABLET(12.5 MG) BY MOUTH DAILY 15 tablet 11   Treprostinil (TYVASO) 0.6 MG/ML SOLN Inhale 18 mcg into the lungs in the morning, at noon, in the evening, and at bedtime. (Patient taking differently: Inhale 18 mcg into the lungs in the morning, at noon,  in the evening, and at bedtime. 7 puffs)     No current facility-administered medications for this encounter.   BP 104/60   Pulse 67   Wt 75.9 kg (167 lb 6.4 oz)   SpO2 99% Comment: 3 l N/C  BMI 28.73 kg/m  General: NAD Neck: No JVD, no thyromegaly or thyroid nodule.  Lungs: Dry crackles at bases.  CV: Nondisplaced PMI.  Heart regular S1/S2, no S3/S4, no murmur.  No peripheral edema.  No carotid bruit.  Normal pedal pulses.  Abdomen: Soft, nontender, no hepatosplenomegaly, no distention.  Skin: Intact without lesions or rashes.  Neurologic: Alert and oriented x 3.  Psych: Normal affect. Extremities: No clubbing or cyanosis.  HEENT: Normal.   Assessment/Plan: 1.  Chronic systolic CHF: Nonischemic cardiomyopathy, cath in 2/22 with nonobstructive CAD.  In past, he was thought to have tachy-mediated CMP.  However, he has not had any recent prolonged fibrillation or flutter, so this does not seem to be the culprit for the recurrent drop in EF. Echo in 2/22 showed biventricular failure with LV EF 25-30% and moderate RV dysfunction (RVH on echo).  RHC in 2/22 showed elevated right-sided filling pressure and pulmonary arterial hypertension.  Cardiac MRI in 3/22 showed EF 28%, diffuse hypokinesis, severe RV dilation with akinetic mid to apical RV wall and RV EF 30%, D-shaped septum, nonspecific RV insertion site LGE.  RV failure most likely is cor pulmonale with pulmonary fibrosis and pulmonary hypertension, but regional wall motion abnormalities (apex and mid-wall RV akinesis) brings up concern for ARVC (though no evidence for fibrofatty RV infiltration on MRI).  Today, NYHA class III symptoms, but he is not volume overloaded on exam.  I suspect that the main issue here causing dyspnea is pulmonary fibrosis and emphysema.   - Continue Lasix 60 mg daily. BMET/BNP today.  - Off Entresto due to low BP.    - Continue Toprol XL 25 mg daily, take at night given soft BP.   - Continue [name] 10 mg daily.   - Continue spironolactone 12.5 mg daily (no BP room to increase), take at night given soft BP.  - Given age, would not pursue ICD.  Narrow QRS so not CRT candidate.  2. 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 PVR 8.1 on 2/22 cath. V/Q scan not suggestive of chronic PE. Possible group 1 PH component.  PH is contributing to RV systolic dysfunction.  - Continue Tyvaso, but will see if we can get the dry powder inhaler for him.    - Continue use CPAP.  3. Pulmonary fibrosis: Restrictive PFTs in 2/22.  CT chest in 12/21 with progressive pulmonary fibrosis and honeycombing.  COVID-19 lung involvement could have worsened pulmonary parenchymal disease.  High resolution CT chest in 9/22 showed interstitial lung disease consistent with UIP as well as moderate emphysema.   - He follows with pulmonary, would he be a candidate for antifibrotic therapy?  - Continue home oxygen.  4. Atrial flutter: Noted in 1/15, had ablation in 2/15, no atrial fibrillation documented since that time.  He is in NSR today.  He is not anticoagulated.  5. COPD: Emphysema on chest CT. Breathing considerably better with prednisone. He wants to continue long-term.  He needs to followup with pulmonary.   Followup in 2 months.   Loralie Champagne 07/20/2021

## 2021-07-21 MED ORDER — PREDNISONE 10 MG PO TABS: 10.0000 mg | ORAL_TABLET | Freq: Every day | ORAL | 2 refills | Status: DC

## 2021-07-21 NOTE — Telephone Encounter (Signed)
ATC patient's spouse, [name] unable to leave vm due to mailbox not being setup.

## 2021-07-21 NOTE — Telephone Encounter (Signed)
Patient's spouse, [name] is aware of below recommendations and voiced her understanding.  Prednisone has been sent preferred pharmacy.  Nothing further needed at this time.

## 2021-07-22 ENCOUNTER — Telehealth: Payer: Self-pay

## 2021-07-22 NOTE — Chronic Care Management (AMB) (Addendum)
Chronic Care Management Pharmacy Assistant   Name: Quince Santana [facility] Sr.  MRN: [id number] DOB: [birth year]   Reason for Encounter:  General Adherence   Recent office visits:  None since last CCM contact  Recent consult visits:  07/11/21-Cardiology-Patient presented for follow up of CHF ,SOB and syncope.Labs ordered-Updated labs showed elevated D-dimer with recommendation that the patient present immediately to the emergency department, with wife to drive him).  Recommend CTA to rule out PE, lower extremity ultrasound to rule out DVT, continuous telemetry, stat echo, continuous telemetry monitoring, serial EKGs, repeat / cycle labs including high-sensitivity troponin once in the emergency department. 07/07/21- [facility]-Labs ordered-BMP (abnormal) 06/15/21- Pulmonology-Patient presented for increased shortness of breath. Xray, labs ordered,Start prednisone taper  05/11/21-Cardiology-Patient presented for obtaining an [employer] oxygen concentrator. Labs ordered (no changes) We will work with [facility] and your insurance to get your Tyvaso changed over to the new powder version  Hospital visits:  07/11/21-[facility] ED- Patient presented for abnormal labs and shortness of breath.We will treat with prednisone taper he has nebulizers at home. No admission  Medications: Outpatient Encounter Medications as of 07/22/2021  Medication Sig   albuterol (VENTOLIN HFA) 108 (90 Base) MCG/ACT inhaler Inhale 2 puffs into the lungs every 4 (four) hours as needed for shortness of breath or wheezing.   allopurinol (ZYLOPRIM) 100 MG tablet TAKE 1 TABLET(100 MG) BY MOUTH DAILY   aspirin 81 MG chewable tablet Chew 81 mg by mouth daily.   atorvastatin (LIPITOR) 20 MG tablet TAKE 1 TABLET BY MOUTH DAILY   Budeson-Glycopyrrol-Formoterol (BREZTRI AEROSPHERE) 160-9-4.8 MCG/ACT AERO Inhale 2 puffs into the lungs in the morning and at bedtime.   Cyanocobalamin (B-12 PO) Take 1,000 mcg by mouth daily.    empagliflozin  ([name]) 10 MG TABS tablet Take 1 tablet (10 mg total) by mouth daily before breakfast.   furosemide (LASIX) 40 MG tablet Take 1.5 tablets (60 mg total) by mouth in the morning AND 1 tablet (40 mg total) every other day. In PM.   metoprolol succinate (TOPROL-XL) 25 MG 24 hr tablet TAKE 1 TABLET(25 MG) BY MOUTH DAILY   potassium chloride SA (KLOR-CON) 20 MEQ tablet Take 1 tablet (20 mEq total) by mouth daily.   predniSONE (DELTASONE) 10 MG tablet Take 4 tabs po daily x 3 days; then 3 tabs daily x3 days; then 2 tabs daily x3 days; then 1 tab daily x 3 days; then stop   predniSONE (DELTASONE) 10 MG tablet Take 1 tablet (10 mg total) by mouth daily with breakfast.   pregabalin (LYRICA) 50 MG capsule 1 tablet at night, if toleraing but still pain increase  50  mg weekly until effect and max 150 mg at night.   spironolactone (ALDACTONE) 25 MG tablet TAKE 1/2 TABLET(12.5 MG) BY MOUTH DAILY   Treprostinil (TYVASO) 0.6 MG/ML SOLN Inhale 18 mcg into the lungs in the morning, at noon, in the evening, and at bedtime. (Patient taking differently: Inhale 18 mcg into the lungs in the morning, at noon, in the evening, and at bedtime. 7 puffs)   No facility-administered encounter medications on file as of 07/22/2021.    Contacted [facility] on 07/27/21 for general disease state and medication adherence call.   Patient is not > 5 days past due for refill on the following medications per chart history:  [facility] Medications: Medication Name/mg Last Fill Days Supply Atorvastatin 39m  07/09/21 90 [name] 194m 07/12/21 30  What concerns do you have about  your medications? The Vania Rea is expensive >100$ monthly   The patient denies side effects with his medications.   How often do you forget or accidentally miss a dose? Never  Do you use a pillbox? Yes   1 week at a time.  Are you having any problems getting your medications from your pharmacy? No   [facility] [place] is the pharmacy of  choice.  Has the cost of your medications been a concern? Yes Vania Rea is getting expensive  Since last visit with CPP, the following interventions have been made: 07/20/21-per Pulmonology-start the patient on prednisone 71m  take 1 tablet daily #30  2 rfs.  The patient has had an ED visit since last contact. 07/11/21-[facility] ED- Patient presented for abnormal labs-we will treat with prednisone taper he has nebulizers at home. No admission  The patient denies problems with their health. The patient reports he is feeling better these days. Now on low dose prednisone daily   he denies  concerns or questions for MDebbora Dus Pharm. D at this time.   Counseled patient on:  [place] taking medications, Importance of taking medication daily without missed doses, Benefits of adherence packaging or a pillbox, and Access to CCM team for any cost, medication or pharmacy concerns.   Care Gaps: Annual wellness visit in last year? Yes  02/25/21 Most Recent BP reading: 108/60  84-P 07/11/21  If Diabetic: Most recent A1C reading 6.5  02/25/21 Last eye exam / retinopathy screening: unknown Last diabetic foot exam: 04/30/21  PCP appointment on 09/06/21, Cardiology appointment on 09/30/21, and Pulmonology appointment with on 09/01/21   MDebbora Dus CPP notified  VAvel Sensor CManorhavenAssistant [phone] I have reviewed the care management and care coordination activities outlined in this encounter and I am certifying that I agree with the content of this note. Will schedule CCM follow up as past due and have CMA discuss PAP options for [name].  MDebbora Dus PharmD Clinical Pharmacist [phone]

## 2021-07-25 ENCOUNTER — Telehealth: Payer: Self-pay | Admitting: Pulmonary Disease

## 2021-07-25 NOTE — Telephone Encounter (Signed)
Caleb Bryant wife is returning phone call. Caleb Bryant phone number is [phone] c or [phone] h. May leave detailed message on home phone.

## 2021-07-25 NOTE — Telephone Encounter (Signed)
Spoke to patient's spouse, [name] [name] is questioning if it is okay for patient to receive full vaccine?  [name], please advise. Thanks

## 2021-07-25 NOTE — Telephone Encounter (Signed)
ATC patient's spouse, [name]. Unable to leave vm due to mailbox not being setup.  Will call back.

## 2021-07-27 ENCOUNTER — Telehealth (HOSPITAL_COMMUNITY): Payer: Self-pay | Admitting: Pharmacist

## 2021-07-27 NOTE — Telephone Encounter (Signed)
He may and SHOULD receive flu vaccine.

## 2021-07-27 NOTE — Telephone Encounter (Signed)
Sent in Tyvaso enrollment application to [facility]. Patient will be transitioned from Tyvaso nebulizer to Tyvaso DPI.     Application pending, will continue to follow.   Audry Riles, PharmD, BCPS, BCCP, CPP Heart Failure Clinic Pharmacist [phone]

## 2021-07-28 NOTE — Telephone Encounter (Signed)
Patient's spouse, [name] is aware of below message and voiced her understanding.  Nothing further needed at this time.

## 2021-08-04 ENCOUNTER — Telehealth: Payer: Self-pay

## 2021-08-04 NOTE — Progress Notes (Addendum)
Patient Assistance forms for [name] obtained from [facility] cares manufacturer website. Forms filled out with patient and provider information. Forms mailed to the patient to obtain signature from Cardiology. The patient will let us know when this is complete so we can get the forms off to the manufacturer for new enrollment for 2023.   Debbora Dus, CPP notified  Avel Sensor, [place] Assistant [phone]  I have reviewed the care management and care coordination activities outlined in this encounter and I am certifying that I agree with the content of this note. No further action required.  Debbora Dus, PharmD Clinical Pharmacist [facility] at [facility] [phone]

## 2021-08-16 ENCOUNTER — Telehealth: Payer: Self-pay | Admitting: Pulmonary Disease

## 2021-08-17 ENCOUNTER — Telehealth: Payer: Self-pay | Admitting: Pulmonary Disease

## 2021-08-17 NOTE — Telephone Encounter (Signed)
Will route the message to AW to follow up on form from [employer].  thanks

## 2021-08-19 NOTE — Telephone Encounter (Signed)
I spoke with Melissa from [employer] about this CMN. There is a CMN that Dr. Diona Browner signed in 05/2021 in [place]. Melissa will check to see where and who they are sending the CMN to

## 2021-08-23 ENCOUNTER — Telehealth: Payer: Self-pay

## 2021-08-23 NOTE — Progress Notes (Signed)
Chronic Care Management Pharmacy Assistant   Name: Feliciano [facility] Sr.  MRN: [id number] DOB: Nov 19, 1933  Reason for Encounter: CCM (Patient Assistance [name] - [place])   08/23/2021 - No answer; left message.  Called patient to follow up on paperwork for patient assistance for [name] [facility]). Patient received papers in the mail; was going to take them to the specialist who prescribed and mail them back to [place], CPP.   Debbora Dus, CPP notified  Marijean Niemann, [place] Clinical Pharmacy Assistant [phone]   Time Spent: 7 Minutes

## 2021-08-25 ENCOUNTER — Other Ambulatory Visit: Payer: Self-pay | Admitting: Cardiovascular Disease

## 2021-08-26 DIAGNOSIS — Z515 Encounter for palliative care: Secondary | ICD-10-CM | POA: Diagnosis not present

## 2021-08-26 DIAGNOSIS — Z7984 Long term (current) use of oral hypoglycemic drugs: Secondary | ICD-10-CM | POA: Diagnosis not present

## 2021-08-26 DIAGNOSIS — F0390 Unspecified dementia without behavioral disturbance: Secondary | ICD-10-CM | POA: Diagnosis not present

## 2021-08-26 DIAGNOSIS — E1151 Type 2 diabetes mellitus with diabetic peripheral angiopathy without gangrene: Secondary | ICD-10-CM | POA: Diagnosis not present

## 2021-08-26 DIAGNOSIS — J449 Chronic obstructive pulmonary disease, unspecified: Secondary | ICD-10-CM | POA: Diagnosis not present

## 2021-08-26 DIAGNOSIS — D692 Other nonthrombocytopenic purpura: Secondary | ICD-10-CM | POA: Diagnosis not present

## 2021-08-26 DIAGNOSIS — Z7951 Long term (current) use of inhaled steroids: Secondary | ICD-10-CM | POA: Diagnosis not present

## 2021-08-26 DIAGNOSIS — I272 Pulmonary hypertension, unspecified: Secondary | ICD-10-CM | POA: Diagnosis not present

## 2021-08-26 DIAGNOSIS — J84112 Idiopathic pulmonary fibrosis: Secondary | ICD-10-CM | POA: Diagnosis not present

## 2021-08-26 DIAGNOSIS — Z6826 Body mass index (BMI) 26.0-26.9, adult: Secondary | ICD-10-CM | POA: Diagnosis not present

## 2021-08-26 DIAGNOSIS — I110 Hypertensive heart disease with heart failure: Secondary | ICD-10-CM | POA: Diagnosis not present

## 2021-08-26 DIAGNOSIS — I5022 Chronic systolic (congestive) heart failure: Secondary | ICD-10-CM | POA: Diagnosis not present

## 2021-08-29 ENCOUNTER — Other Ambulatory Visit: Payer: PPO

## 2021-08-30 ENCOUNTER — Telehealth: Payer: Self-pay | Admitting: Pulmonary Disease

## 2021-08-31 ENCOUNTER — Telehealth (HOSPITAL_COMMUNITY): Payer: Self-pay | Admitting: Pharmacy Technician

## 2021-08-31 ENCOUNTER — Other Ambulatory Visit (HOSPITAL_COMMUNITY): Payer: Self-pay

## 2021-08-31 NOTE — Telephone Encounter (Signed)
Advanced Heart Failure Patient Advocate Encounter  Sent in [employer] renewal application to [employer].

## 2021-08-31 NOTE — Telephone Encounter (Signed)
Patient Advocate Encounter   Received notification from [facility] that prior authorization for Tyvaso DPI is required.   PA submitted on CoverMyMeds Key [id number] Status is pending   Will continue to follow.   Audry Riles, PharmD, BCPS, BCCP, CPP Heart Failure Clinic Pharmacist [phone]

## 2021-09-01 ENCOUNTER — Encounter: Payer: Self-pay | Admitting: Pulmonary Disease

## 2021-09-01 ENCOUNTER — Ambulatory Visit: Payer: PPO | Admitting: Pulmonary Disease

## 2021-09-01 ENCOUNTER — Other Ambulatory Visit: Payer: Self-pay

## 2021-09-01 VITALS — BP 100/64 | HR 89 | Temp 97.8°F | Ht 64.0 in | Wt 172.6 lb

## 2021-09-01 DIAGNOSIS — J84112 Idiopathic pulmonary fibrosis: Secondary | ICD-10-CM

## 2021-09-01 DIAGNOSIS — J9611 Chronic respiratory failure with hypoxia: Secondary | ICD-10-CM

## 2021-09-01 DIAGNOSIS — I272 Pulmonary hypertension, unspecified: Secondary | ICD-10-CM | POA: Diagnosis not present

## 2021-09-01 DIAGNOSIS — J449 Chronic obstructive pulmonary disease, unspecified: Secondary | ICD-10-CM | POA: Diagnosis not present

## 2021-09-01 DIAGNOSIS — I5022 Chronic systolic (congestive) heart failure: Secondary | ICD-10-CM | POA: Diagnosis not present

## 2021-09-01 DIAGNOSIS — Z66 Do not resuscitate: Secondary | ICD-10-CM

## 2021-09-01 MED ORDER — TRELEGY ELLIPTA 200-62.5-25 MCG/ACT IN AEPB: 1.0000 | INHALATION_SPRAY | Freq: Every day | RESPIRATORY_TRACT | 2 refills | Status: DC

## 2021-09-01 NOTE — Patient Instructions (Signed)
We history since-year-old we are stopping the Breztri since you feel you are getting good results from it.  We are giving you a trial of Trelegy Ellipta 1 puff daily make sure you rinse your mouth well after use it, please let us know how you do with it.  We will see her in follow-up in 3 months time call sooner should any new problems arise.

## 2021-09-01 NOTE — Progress Notes (Signed)
Subjective:    Patient ID: Caleb Eslinger Sr., male    DOB: 06-Aug-1934, 87 y.o.   MRN: [id number]  HPI  Patient is a very complex 87 year old former smoker (quit 1980, 97 PY) who presents for follow-up on the issue of shortness of breath and chronic respiratory failure with hypoxia.  This is a scheduled visit.  He has interstitial lung disease/pulmonary fibrosis with concomitant COPD as well.  He has had issues with chronic cough worse since his COVID-19 diagnosis in October 2021.  Initially, he noted  improvement of his cough with Breztri inhaler which was provided for him during his initial visit here on January 2022.  However, now he notes that Judithann Sauger is not helping him as much.  He had a 2 D echo on 30 November 2020 which revealed an EF of 25 to 30%, global hypokinesis, DD grade I, mild to moderate tricuspid valve regurgitation and evidence suggestive of pulmonary hypertension.  He was referred back to cardiology (Dr. Fletcher Anon) for potential right heart cath.  He underwent right heart cath which showed severe pulmonary hypertension with mildly reduced cardiac output.  Since then he has been seen at the advanced heart failure clinic at [facility] in [place].  He was placed on Tyvaso and has noticed improvement on his dyspnea with this.  He is also on Entresto and Lasix.  He has noted some hair loss since he started on the Tyvaso which is rare side effect.  Since his last visit with me in June 2022, he has had issues with oxygen desaturations and increasing dyspnea.  He was evaluated with a CT angio which did not show PE.  His interstitial lung disease is stable.  He had adjustments to his diuretics and this has helped with his symptoms.  He also started on standing dose prednisone which also has helped him.  As noted Judithann Sauger is not as helpful as it once was for his cough.  He does not endorse any other complaint today.  We did have a end-of-life discussion and goals of care discussion today,  he reiterates that he is DNR/DNI.  DATA: 01/25/2016 spirometry: FEV1 1.7 L or 62% predicted, FVC 2.4 L or 64% predicted.  FEV1/FVC 73%.  FEF 25-75% 1.4 L or 56% predicted.  Suspect combined restrictive/obstructive physiology. 01/28/2016 CT chest: Nonspecific interstitial lung disease present with peripheral distribution. 10/13/2020 CT chest: Marked interval progression of underlying interstitial lung disease demonstrating peripheral and basilar predominant architectural distortion, honeycombing and subpleural reticulation.  Basilar groundglass pulmonary infiltrates, bullous emphysema, findings consistent with UIP plus emphysema 11/16/2020 CTD work-up: RF 14.7,  ANA negative, RP 0.6, ESR 3 11/30/2020 2D echo: LVEF 25 to 30%, global hypokinesis of the LV, DD grade I, RV systolic function moderately reduced, right ventricular size moderately enlarged, dilated pulmonary artery 12/03/2020 PFTs: FEV1 1.62 L or 77% predicted, FVC 2.02 L or 66% predicted, FEV1/FVC 80%, no bronchodilator response, lung volumes moderately to severely reduced, FEF 25-75 % 64%.  Diffusion capacity moderately to severely reduced at 55% 12/17/2020 right and left heart cath: ost RCA to proximal axillary lesion is 20% stenosed, proximal RCA lesion is 30% stenosis, mid RCA lesion 20% stenosed. RA: 15/22 with a mean of 17 mmHg RV: 70 over 9 mmHg PCW: 13 mmHg PA: 70/27 with a mean of 44 mmHg LVEDP: 13 mmHg. PA sat was 65.7% and aortic sat was 96.5%. Cardiac output was 3.82 with a cardiac index of 2.13. Pulmonary vascular resistance was 8.12 [place] units 12/21/2020 BNP:  1,273 01/18/2021 cardiac MRI: EF of 28% diffuse hypokinesis, severe RV dilatation with a connective mid to apical RV wall RV EF of 30% D-shaped septum suggestive of RV pressure/volume overload with a EF of 20%.  No RV thrombus.  Consistent with biventricular failure though RV looks worse. 02/02/2021 pulmonary perfusion: No acute or chronic blood clot. 07/11/2021 CT  angio chest: Negative for PE.  Unchanged moderate pulmonary fibrosis, emphysema, CAD.   Review of Systems A 10 point review of systems was performed and it is as noted above otherwise negative.  Patient Active Problem List   Diagnosis Date Noted   Pulmonary HTN ([place]) 56/43/3295   Acute systolic heart failure ([place])    Hypoxia 08/20/2020   CKD (chronic kidney disease), stage II    Acute respiratory failure with hypoxia (HCC)    Stage 3a chronic kidney disease (HCC)    Hypokalemia    Type 2 diabetes mellitus with hyperlipidemia (HCC)    GERD (gastroesophageal reflux disease) 10/17/2016   CKD stage 3 due to type 2 diabetes mellitus ([place]) 10/04/2016   Idiopathic pulmonary fibrosis ([place]) 02/25/2016   Thoracic aortic aneurysm without rupture 02/01/2016   Chronic cough 01/25/2016   Chronic obstructive pulmonary disease ([place]) 01/25/2016   Midline thoracic back pain 10/19/2015   Advanced directives, counseling/discussion 10/14/2015   History of atrial flutter 12/21/2014   Ventral hernia without obstruction or gangrene 18/84/1660   Chronic systolic CHF (congestive heart failure) ([place]) 12/23/2013   Intermediate coronary syndrome ([place]) 11/04/2013   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   DDD (degenerative disc disease), cervical 03/31/2013   Fuchs' corneal dystrophy 02/19/2013   Neuropathy due to secondary diabetes mellitus ([place]) 12/31/2012   Gout, chronic, 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   DM (diabetes mellitus) with peripheral vascular complication ([place]) 63/10/6008   Shoulder pain, bilateral 11/06/2011   Vitamin B12 deficiency 03/11/2009   INTERMITTENT VERTIGO 03/11/2009   Mild dementia 09/01/2008   HYPERCHOLESTEROLEMIA 11/29/2007   ALLERGIC RHINITIS 11/29/2007   Essential hypertension 11/14/2007   OSTEOARTHRITIS 06/13/2007   Peripheral vascular disease due to secondary diabetes mellitus ([place]) 06/12/2007   DIVERTICULOSIS, COLON 06/12/2007   BENIGN PROSTATIC HYPERTROPHY 06/12/2007   Social  History   Tobacco Use   Smoking status: Former    Packs/day: 3.00    Years: 30.00    Pack years: 90.00    Types: Cigarettes    Quit date: 1980    Years since quitting: 42.8   Smokeless tobacco: Former    Types: Chew   Tobacco comments:    11/04/2013 "quit smoking in the 1980's or so; quit chewing before I quit smoking"  Substance Use Topics   Alcohol use: No    Comment: 11/04/2013 "quit driking in 1980's; was called a weekend alcoholic"   Allergies  Allergen Reactions   Codeine Nausea And Vomiting   Current Meds  Medication Sig   albuterol (VENTOLIN HFA) 108 (90 Base) MCG/ACT inhaler Inhale 2 puffs into the lungs every 4 (four) hours as needed for shortness of breath or wheezing.   allopurinol (ZYLOPRIM) 100 MG tablet TAKE 1 TABLET(100 MG) BY MOUTH DAILY   aspirin 81 MG chewable tablet Chew 81 mg by mouth daily.   atorvastatin (LIPITOR) 20 MG tablet TAKE 1 TABLET BY MOUTH DAILY   Cyanocobalamin (B-12 PO) Take 1,000 mcg by mouth daily.    empagliflozin ([name]) 10 MG TABS tablet Take 1 tablet (10 mg total) by mouth daily before breakfast.   Fluticasone-Umeclidin-Vilant (TRELEGY ELLIPTA) 200-62.5-25 MCG/ACT AEPB Inhale 1 puff into  the lungs daily.   furosemide (LASIX) 40 MG tablet Take 1.5 tablets (60 mg total) by mouth in the morning AND 1 tablet (40 mg total) every other day. In PM.   metoprolol succinate (TOPROL-XL) 25 MG 24 hr tablet TAKE 1 TABLET(25 MG) BY MOUTH DAILY   potassium chloride SA (KLOR-CON) 20 MEQ tablet Take 1 tablet (20 mEq total) by mouth daily.   predniSONE (DELTASONE) 10 MG tablet Take 1 tablet (10 mg total) by mouth daily with breakfast.   pregabalin (LYRICA) 50 MG capsule 1 tablet at night, if toleraing but still pain increase  50  mg weekly until effect and max 150 mg at night.   spironolactone (ALDACTONE) 25 MG tablet TAKE 1/2 TABLET(12.5 MG) BY MOUTH DAILY   Treprostinil (TYVASO) 0.6 MG/ML SOLN Inhale 18 mcg into the lungs in the morning, at noon, in the  evening, and at bedtime. (Patient taking differently: Inhale 18 mcg into the lungs in the morning, at noon, in the evening, and at bedtime. 7 puffs)   [DISCONTINUED] Budeson-Glycopyrrol-Formoterol (BREZTRI AEROSPHERE) 160-9-4.8 MCG/ACT AERO Inhale 2 puffs into the lungs in the morning and at bedtime.   [DISCONTINUED] predniSONE (DELTASONE) 10 MG tablet Take 4 tabs po daily x 3 days; then 3 tabs daily x3 days; then 2 tabs daily x3 days; then 1 tab daily x 3 days; then stop   Immunization History  Administered Date(s) Administered   Influenza Split 07/31/2011, 08/08/2012, 09/01/2013   Influenza Whole 10/05/2004, 08/16/2007, 08/11/2008, 08/06/2009   Influenza, High Dose Seasonal PF 07/27/2016, 08/09/2017, 08/01/2018, 07/25/2019, 07/09/2020   Influenza,inj,Quad PF,6+ Mos 06/22/2014, 10/14/2015   Influenza-Unspecified 08/30/2018, 08/23/2021   Pneumococcal Conjugate-13 06/22/2014   Pneumococcal Polysaccharide-23 04/14/2009   Td 10/31/1997, 05/13/2010, 12/05/2019    Code Status: DNR/DNI     Objective:   Physical Exam BP 100/64 (BP Location: Left Arm, Cuff Size: Normal)   Pulse 89   Temp 97.8 F (36.6 C) (Temporal)   Ht _0  (1.626 m)   Wt 172 lb 9.6 oz (78.3 kg)   SpO2 93%   BMI 29.63 kg/m  GENERAL: Well-developed well-nourished gentleman, no acute distress on 2 L nasal cannula O2.  No conversational dyspnea. HEAD: Normocephalic, atraumatic. EYES: Pupils equal, round, reactive to light.  No scleral icterus.  Corneal opacity right. MOUTH: Nose/mouth/throat not examined due to masking requirements for COVID 19. NECK: Supple. No thyromegaly. Trachea midline. No JVD.  No adenopathy. PULMONARY: Good air entry bilaterally.  Bilateral Velcro crackles at the bases, no adventitious sounds. CARDIOVASCULAR: S1 and S2. Regular rate and rhythm.  Grade 2/6 systolic ejection murmur at the aortic area.  No gallops or rubs noted. ABDOMEN: Benign. MUSCULOSKELETAL: Osteoarthritis changes both hands, no  clubbing, no edema. NEUROLOGIC: No focal deficit, no gait disturbance, fluent speech  SKIN: Intact,warm,dry.  On limited exam no rashes. PSYCH: Mood and behavior normal.       Assessment & Plan:     ICD-10-CM   1. Idiopathic pulmonary fibrosis (HCC)  J84.112    Stable by most recent CT angio chest 9/22 On Tyvaso for pulmonary hypertension Continue prednisone 10 mg daily Does not want to pursue antifibrotics    2. COPD mixed type (HCC)  J44.9    Discontinue Breztri, not effective Trial of Trelegy Ellipta    3. Chronic respiratory failure with hypoxia (HCC)  J96.11    Continue oxygen at 3 L/min May increase to 4 to 5 L/min with exertion    4. Chronic systolic CHF (congestive heart failure) (  [place])  I50.22    This issue adds complexity to his management Followed by cardiology    5. Pulmonary hypertension (HCC) - severe  I27.20    Continue oxygen supplementation Continue Tyvaso Continue management of cardiomyopathy    6. DNR (do not resuscitate)  Z66    Patient notes that he is DNR/DNI [place] discussion today     Meds ordered this encounter  Medications   Fluticasone-Umeclidin-Vilant (TRELEGY ELLIPTA) 200-62.5-25 MCG/ACT AEPB    Sig: Inhale 1 puff into the lungs daily.    Dispense:  60 each    Refill:  2    Order Specific Question:   Lot Number?    Answer:   [id number]    Order Specific Question:   Expiration Date?    Answer:   12/29/2022    Order Specific Question:   Manufacturer?    Answer:   [employer] [[id number]]    Order Specific Question:   Quantity    Answer:   2   Patient appears compensated as he is to get.  Continue management as noted above.  See him in follow-up in 3 months time he is to call sooner should any new difficulties arise.  Renold Don, MD Advanced Bronchoscopy PCCM [facility]    *This note was dictated using voice recognition software/Dragon.  Despite best efforts to proofread, errors can occur which can change the meaning.   Any change was purely unintentional.

## 2021-09-01 NOTE — Telephone Encounter (Signed)
I called Melissa with [employer] again 08/31/21 had to leave a message waiting for her return call

## 2021-09-01 NOTE — Telephone Encounter (Signed)
Advanced Heart Failure Patient Advocate Encounter  Prior Authorization for Tyvaso DPI has been approved.    Effective dates: 09/01/21 through 10/29/22  Audry Riles, PharmD, BCPS, BCCP, CPP Heart Failure Clinic Pharmacist [phone]

## 2021-09-02 NOTE — Telephone Encounter (Signed)
Caleb Bryant from [place] faxed the CMN for [name] to sign yesterday and it was faxed to [facility] personal fax #. I called Caleb Bryant this morning to confirm that she did the the CMN. She has given it to the correct people

## 2021-09-02 NOTE — Telephone Encounter (Signed)
Melissa from [employer] faxed the CMN for Dr. Gonzalez to sign yesterday and it was faxed to Melissa's personal fax #. I called Melissa this morning to confirm that she did the the CMN. She has given it to the correct people 

## 2021-09-05 ENCOUNTER — Other Ambulatory Visit: Payer: Self-pay | Admitting: Family Medicine

## 2021-09-05 NOTE — Telephone Encounter (Signed)
Spoke with Caleb Bryant.  He states he took 2 capsules together and it made him woozy.  He would like try taking one capsule in the morning and one capsule at bedtime.  If okay with this, please sign refill request below.

## 2021-09-05 NOTE — Telephone Encounter (Signed)
Last office visit 03/04/2021 for CPE.  Last refilled 03/04/21 for #90 with 1 refill.  Next Appt: 11/08/2021 for DM.

## 2021-09-05 NOTE — Telephone Encounter (Signed)
Call.. has he increased the dose as instructions recommended?

## 2021-09-06 ENCOUNTER — Ambulatory Visit: Payer: PPO | Admitting: Family Medicine

## 2021-09-08 ENCOUNTER — Other Ambulatory Visit (HOSPITAL_COMMUNITY): Payer: Self-pay | Admitting: Cardiology

## 2021-09-09 ENCOUNTER — Telehealth: Payer: Self-pay

## 2021-09-09 NOTE — Progress Notes (Signed)
09/09/21 - Called patient to inform him of "Extra Help" for his [name] prescription. No answer; left message.  Debbora Dus, CPP notified  Marijean Niemann, [place] Clinical Pharmacy Assistant [phone]  Time Spent:  5 Minutes

## 2021-09-09 NOTE — Progress Notes (Addendum)
    Chronic Care Management Pharmacy Assistant   Name: Caleb Bryant [facility] Sr[facility]  MRN: [id number] DOB: Jun 21, 1934  Reason for Encounter: CCM ([name] Patient Assistance through [employer])   [place] in regards to patient's patience assistance for [name]. [employer] received his paperwork; patient's income meets the requirements for [place] so patient needs to contact Social Security (LIS). If patient is denied for [place] he can then submit his denial letter to [employer].   Debbora Dus, CPP notified  Marijean Niemann, [place] Clinical Pharmacy Assistant [phone]  [facility] Time Spent:  15 min CPP time Spent: 8 min

## 2021-09-11 ENCOUNTER — Encounter: Payer: Self-pay | Admitting: Pulmonary Disease

## 2021-09-19 ENCOUNTER — Telehealth: Payer: Self-pay | Admitting: Pulmonary Disease

## 2021-09-19 ENCOUNTER — Other Ambulatory Visit (HOSPITAL_COMMUNITY): Payer: Self-pay | Admitting: *Deleted

## 2021-09-19 MED ORDER — ALBUTEROL SULFATE HFA 108 (90 BASE) MCG/ACT IN AERS: 2.0000 | INHALATION_SPRAY | RESPIRATORY_TRACT | 0 refills | Status: AC | PRN

## 2021-09-19 NOTE — Telephone Encounter (Signed)
Called and spoke to patient's spouse, [name]. Caleb Bryant is requesting a refill on ventolin. Okay per LG verbally.  Caleb Bryant also had questions regarding Lasix Rx, recommended that she contact Dr. Aundra Dubin.  Nothing further needed at this time.

## 2021-09-26 ENCOUNTER — Telehealth (HOSPITAL_COMMUNITY): Payer: Self-pay | Admitting: Pharmacy Technician

## 2021-09-26 NOTE — Telephone Encounter (Signed)
Advanced Heart Failure Patient Advocate Encounter   Patient was approved to receive Tyvaso from [place] ([facility] dispenses the medication)  Patient ID: [id number] Effective dates: 10/30/2021 through 10/29/2022  Charlann Boxer, CPhT

## 2021-09-27 IMAGING — DX DG CHEST 2V
2 series · 2 of 2 positions shown · non-contrast
Comparison: Radiographs 07/30/2020, 07/29/2020 and 01/25/2016. CT
01/28/2016.

CLINICAL DATA: Persistent shortness of breath and oxygen
requirement following 65TI5-8I pneumonia 3 months ago. History of
COPD.

EXAM:
CHEST - 2 VIEW

[chest pa]
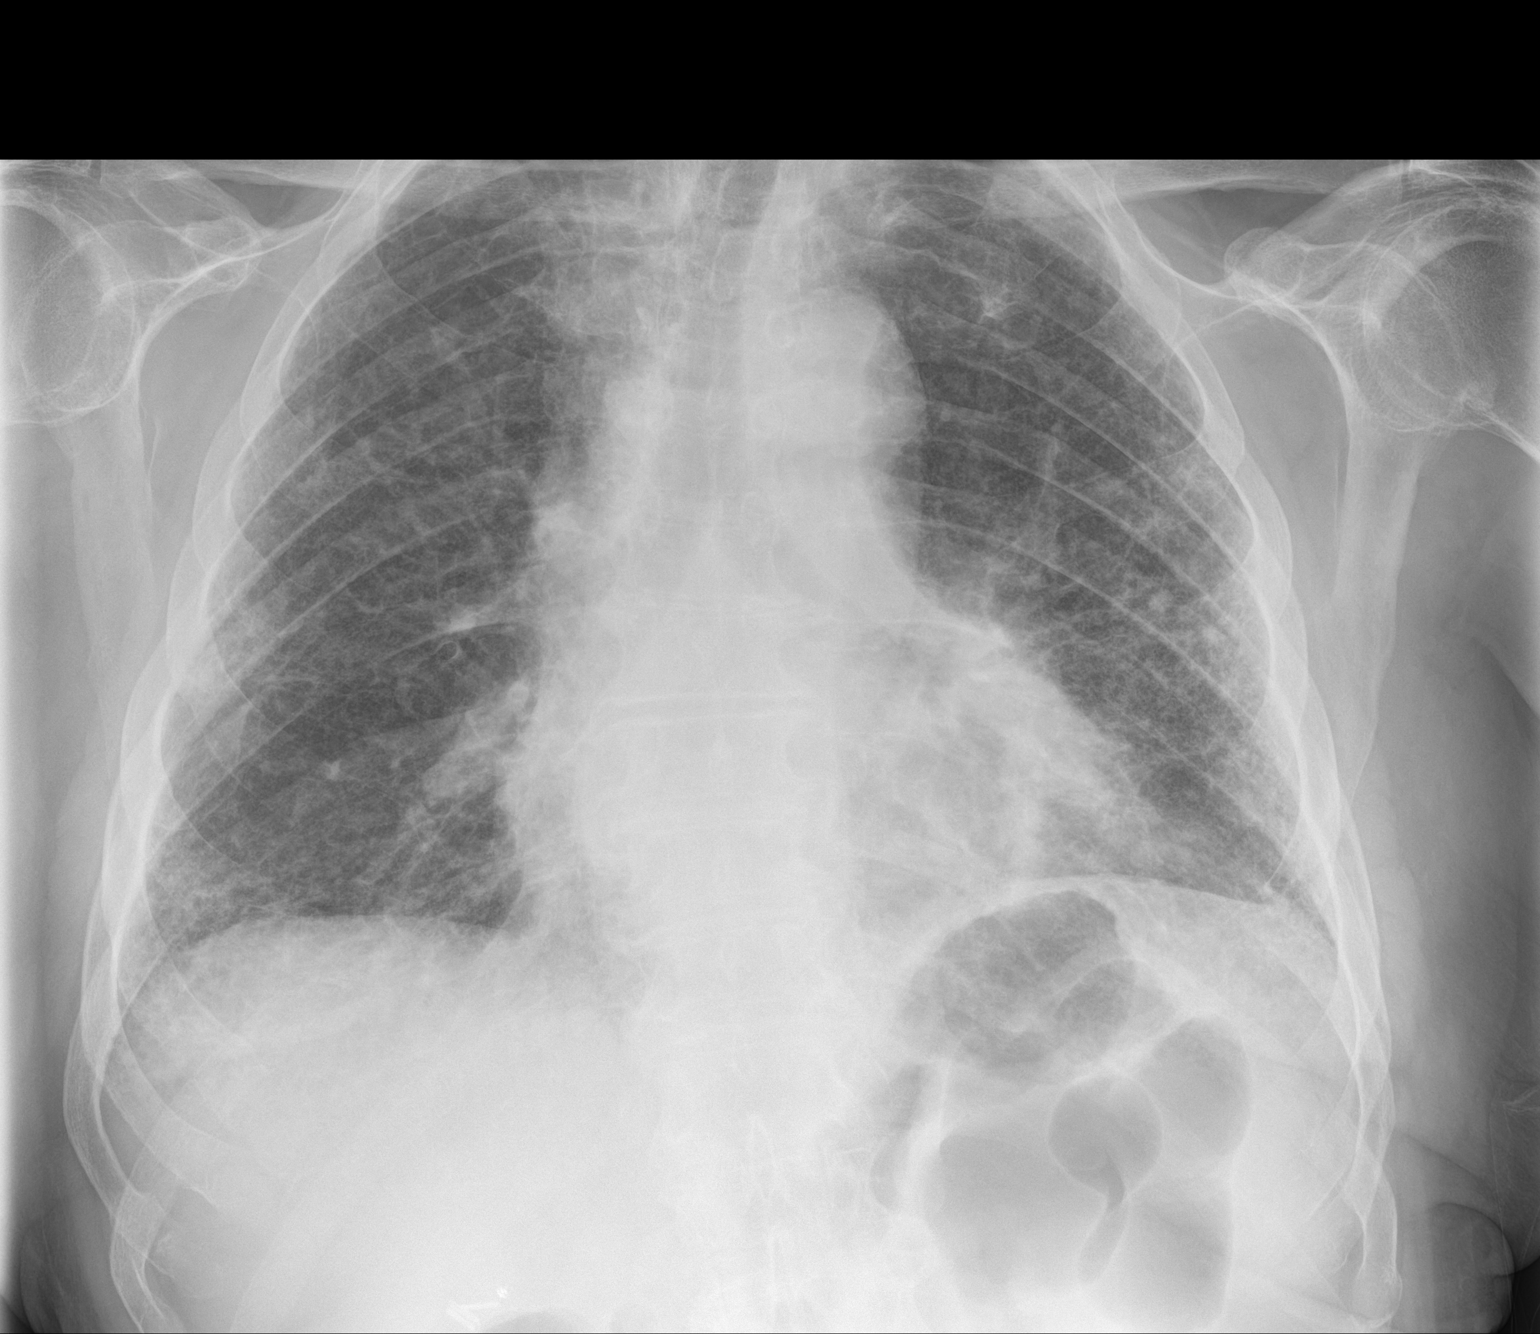

[chest lat]
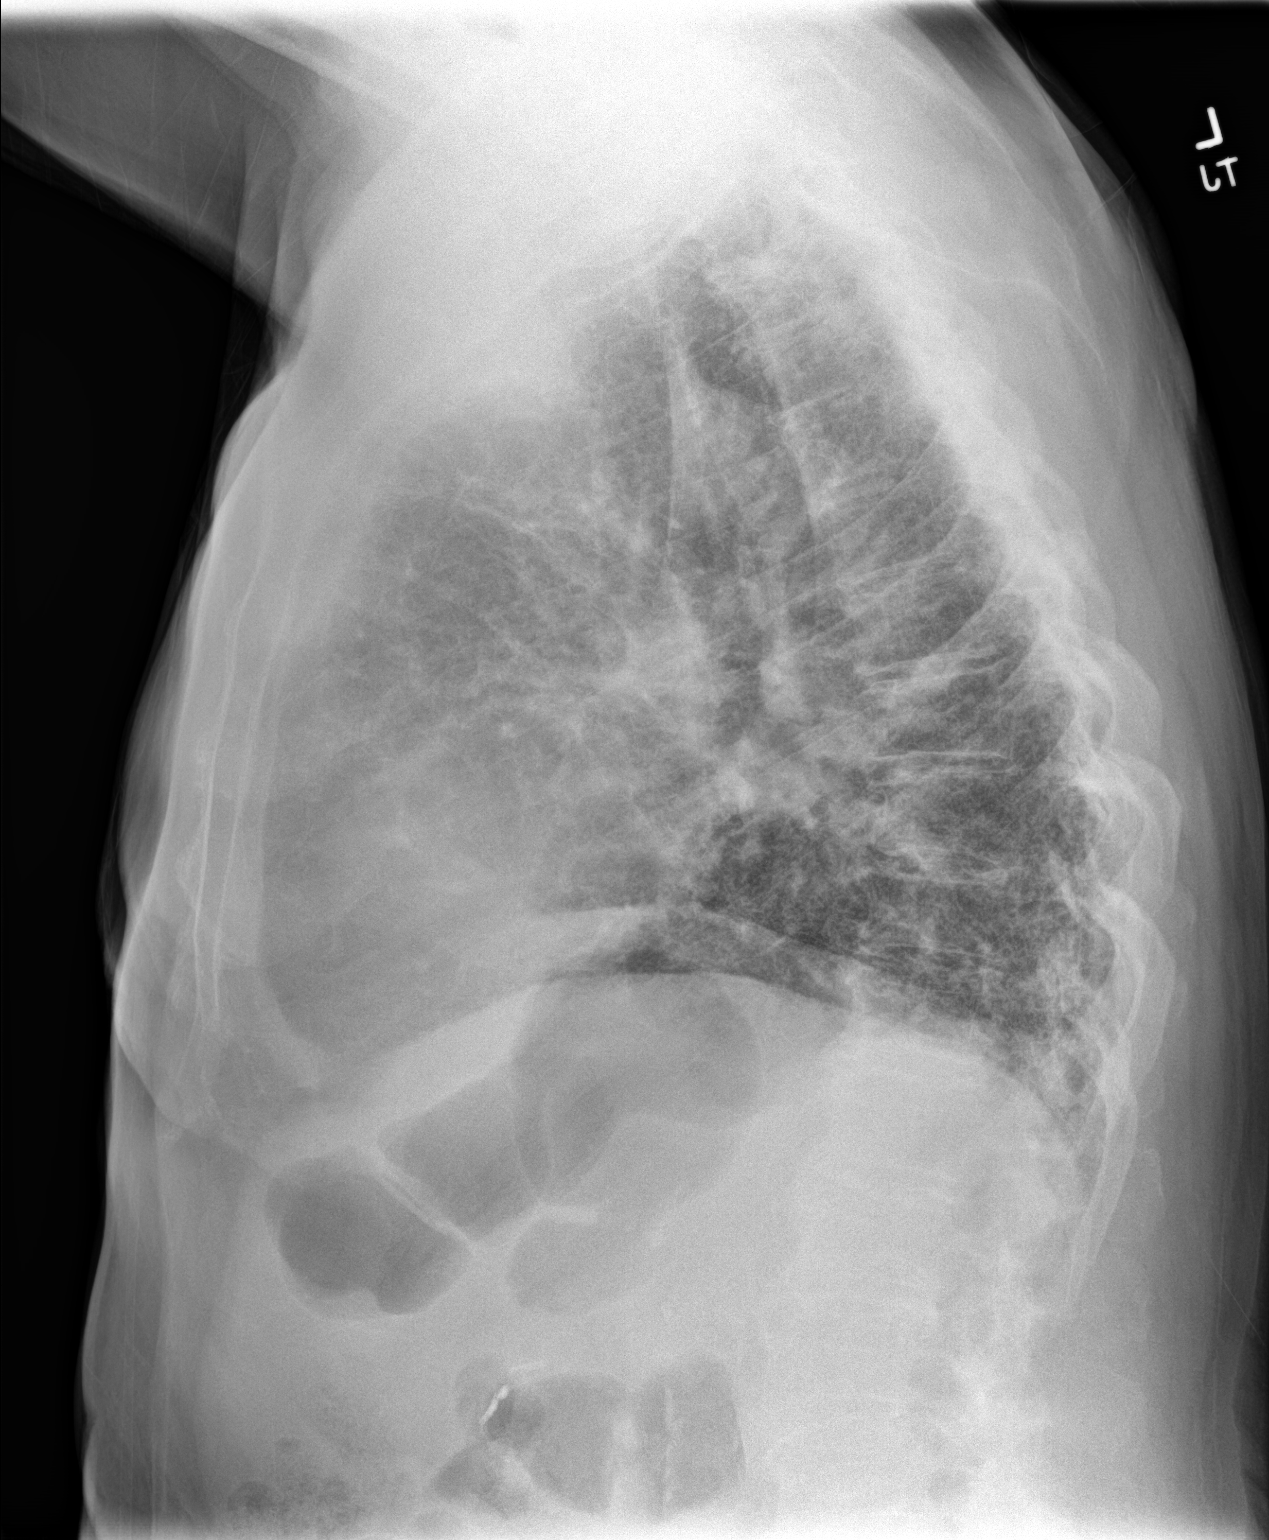

[2 of 2 positions shown; findings below may reference images not displayed]

FINDINGS: The heart size and mediastinal contours are stable without definite
adenopathy. Diffuse interstitial thickening and subpleural
reticulation are again demonstrated bilaterally, similar to the most
recent study and progressive from 1617. There is no confluent
airspace opacity, pleural effusion or pneumothorax. There are stable
degenerative changes within the spine.
IMPRESSION: Diffuse interstitial thickening and subpleural reticulation most
consistent with progressive chronic interstitial lung
disease/pulmonary fibrosis. Findings could in part reflect
postinflammatory scarring from prior viral pneumonia, but no changes
are seen over the last 2 months. Consider follow-up high-resolution
chest CT.

## 2021-09-29 NOTE — Telephone Encounter (Signed)
Contacted [facility] to check on the status of Tyvaso DPI for Caleb Bryant. Received the following message:   The Tyvaso has a high co-pay of $1,839.4. patient is not eligible for a co-pay card, and there are no foundations open at the moment. The patient was referred to our internal co-pay assistance program. He was sent an application, but has not sent the completed form back. We tried reaching him on 11/23 to follow up, but he did not respond. If you are able to reach him, please have him call pharmacy at [phone].   Will reach out to patient and ask him to complete the application so that the pharmacy can move forward with processing his enrollment.   Audry Riles, PharmD, BCPS, BCCP, CPP Heart Failure Clinic Pharmacist [phone]

## 2021-09-30 ENCOUNTER — Other Ambulatory Visit: Payer: Self-pay

## 2021-09-30 ENCOUNTER — Encounter (HOSPITAL_COMMUNITY): Payer: Self-pay | Admitting: Cardiology

## 2021-09-30 ENCOUNTER — Ambulatory Visit (HOSPITAL_COMMUNITY)
Admission: RE | Admit: 2021-09-30 | Discharge: 2021-09-30 | Disposition: A | Discharge status: Home / Self Care | Payer: PPO | Source: Ambulatory Visit | Attending: Cardiology | Admitting: Cardiology

## 2021-09-30 VITALS — BP 112/74 | HR 78 | Wt 174.4 lb

## 2021-09-30 DIAGNOSIS — Z8616 Personal history of COVID-19: Secondary | ICD-10-CM | POA: Insufficient documentation

## 2021-09-30 DIAGNOSIS — Z87891 Personal history of nicotine dependence: Secondary | ICD-10-CM | POA: Diagnosis not present

## 2021-09-30 DIAGNOSIS — R0602 Shortness of breath: Secondary | ICD-10-CM | POA: Diagnosis not present

## 2021-09-30 DIAGNOSIS — I272 Pulmonary hypertension, unspecified: Secondary | ICD-10-CM

## 2021-09-30 DIAGNOSIS — Z7952 Long term (current) use of systemic steroids: Secondary | ICD-10-CM | POA: Insufficient documentation

## 2021-09-30 DIAGNOSIS — I5082 Biventricular heart failure: Secondary | ICD-10-CM | POA: Insufficient documentation

## 2021-09-30 DIAGNOSIS — Z7984 Long term (current) use of oral hypoglycemic drugs: Secondary | ICD-10-CM | POA: Diagnosis not present

## 2021-09-30 DIAGNOSIS — N183 Chronic kidney disease, stage 3 unspecified: Secondary | ICD-10-CM | POA: Diagnosis not present

## 2021-09-30 DIAGNOSIS — I110 Hypertensive heart disease with heart failure: Secondary | ICD-10-CM | POA: Insufficient documentation

## 2021-09-30 DIAGNOSIS — I2510 Atherosclerotic heart disease of native coronary artery without angina pectoris: Secondary | ICD-10-CM | POA: Diagnosis not present

## 2021-09-30 DIAGNOSIS — E1122 Type 2 diabetes mellitus with diabetic chronic kidney disease: Secondary | ICD-10-CM | POA: Diagnosis not present

## 2021-09-30 DIAGNOSIS — Z09 Encounter for follow-up examination after completed treatment for conditions other than malignant neoplasm: Secondary | ICD-10-CM | POA: Insufficient documentation

## 2021-09-30 DIAGNOSIS — I5022 Chronic systolic (congestive) heart failure: Secondary | ICD-10-CM | POA: Diagnosis not present

## 2021-09-30 DIAGNOSIS — I428 Other cardiomyopathies: Secondary | ICD-10-CM | POA: Insufficient documentation

## 2021-09-30 DIAGNOSIS — Z7901 Long term (current) use of anticoagulants: Secondary | ICD-10-CM | POA: Insufficient documentation

## 2021-09-30 DIAGNOSIS — I2721 Secondary pulmonary arterial hypertension: Secondary | ICD-10-CM | POA: Insufficient documentation

## 2021-09-30 DIAGNOSIS — J439 Emphysema, unspecified: Secondary | ICD-10-CM | POA: Insufficient documentation

## 2021-09-30 DIAGNOSIS — J8410 Pulmonary fibrosis, unspecified: Secondary | ICD-10-CM | POA: Diagnosis not present

## 2021-09-30 LAB — BASIC METABOLIC PANEL
Anion gap: 11 (ref 5–15)
BUN: 30 mg/dL — ABNORMAL HIGH (ref 8–23)
CO2: 32 mmol/L (ref 22–32)
Calcium: 9.4 mg/dL (ref 8.9–10.3)
Chloride: 96 mmol/L — ABNORMAL LOW (ref 98–111)
Creatinine, Ser: 1.54 mg/dL — ABNORMAL HIGH (ref 0.61–1.24)
GFR, Estimated: 44 mL/min — ABNORMAL LOW (ref >60)
Glucose, Bld: 125 mg/dL — ABNORMAL HIGH (ref 70–99)
Potassium: 4.3 mmol/L (ref 3.5–5.1)
Sodium: 139 mmol/L (ref 135–145)

## 2021-09-30 LAB — BRAIN NATRIURETIC PEPTIDE: B Natriuretic Peptide: 190.9 pg/mL — ABNORMAL HIGH (ref 0.0–100.0)

## 2021-09-30 MED ORDER — FUROSEMIDE 40 MG PO TABS: ORAL_TABLET | ORAL | 11 refills | Status: DC

## 2021-09-30 MED ORDER — SPIRONOLACTONE 25 MG PO TABS: 25.0000 mg | ORAL_TABLET | Freq: Every day | ORAL | 11 refills | Status: DC

## 2021-09-30 NOTE — Patient Instructions (Signed)
Medication Changes:  Increase Spironolactone to 25 mg (1 tab) Daily at Bedtime  Lab Work:  --Done today, we will call you for abnormal results --Your physician recommends that you return for lab work in: 1-2 weeks, this can be done at [facility]   Testing/Procedures:  None  Referrals:  None  Special Instructions // Education:  Increase your oxygent to 6L with activity  Follow-Up in: 2 months  At the [facility], you and your health needs are our priority. We have a designated team specialized in the treatment of Heart Failure. This Care Team includes your primary Heart Failure Specialized Cardiologist (physician), Advanced Practice Providers (APPs- Physician Assistants and Nurse Practitioners), and Pharmacist who all work together to provide you with the care you need, when you need it.   You may see any of the following providers on your designated Care Team at your next follow up:  Dr Glori Bickers Dr Haynes Kerns, NP Lyda Jester, [place] [facility] [place], [place] Audry Riles, PharmD   Please be sure to bring in all your medications bottles to every appointment.   Need to Contact us:  If you have any questions or concerns before your next appointment please send [place] a message through [place] or call our office at [phone].    TO LEAVE A MESSAGE FOR THE NURSE SELECT OPTION 2, PLEASE LEAVE A MESSAGE INCLUDING: YOUR NAME DATE OF BIRTH CALL BACK NUMBER REASON FOR CALL**this is important as we prioritize the call backs  YOU WILL RECEIVE A CALL BACK THE SAME DAY AS LONG AS YOU CALL BEFORE 4:00 PM

## 2021-09-30 NOTE — Progress Notes (Signed)
Patient attempted to perform 6 minute walk.  He finished approx 250 feet in 2.5 minutes and stopped.  His oxygen saturation were 98 on 4L beginning the walk and dipped as low as 76% at the end before stopping.  His HR began at 96 bpm and got as high as 115 bpm. Upon returning to the room he quickly recovered his oxygen saturation back to 93 percent on 4L.

## 2021-09-30 NOTE — Telephone Encounter (Signed)
Advanced Heart Failure Patient Advocate Encounter  The patient was seen in clinic today and when presented with the information to get Tyvaso DPI assistance, the patient ultimately decided that he would prefer to stay on the Tyvaso nebs that he is currently using and has approved for.  [facility]) will reach out to our contact at [facility] and let them know to stop the DPI assistance efforts.   Charlann Boxer, CPhT

## 2021-10-02 NOTE — Progress Notes (Signed)
PCP: [name], MD Cardiology: Dr. Fletcher Anon HF Cardiology: Dr. Aundra Dubin  87 y.o. with history of atrial flutter, pulmonary fibrosis, and chronic systolic CHF was referred by Dr. Fletcher Anon for evaluation of CHF and pulmonary hypertension.  Patient has had pulmonary fibrosis since 2013, thought to be related to working in a [employer], had exposure to asbestos.  Last CT chest in 12/21 showed progressive fibrosis and honeycombing.  In 1/15, patient was noted to be in atrial flutter.  Echo at the time showed EF 25-30% and cath showed minimal CAD.  He had atria fibrillation ablation in 2/15 and anticoagulation was later stopped.  Echo in 8/16 showed EF back to normal range.  In 10/21, he had COVID-19 PNA.  He feels like he has never recovered from this.  Prior to 10/21, he was short of breath after weed-eating for about 15 minutes.  After 10/21, progressive dyspnea and required home oxygen.    Echo was done in 2/22 due to worsening symptoms.  This showed EF 25-30% with moderate RV dysfunction.  RHC/LHC was then done in 2/22, showing mild luminal irregularities, elevated right heart filling pressure and severe pulmonary arterial hypertension with normal PCWP.  Cardiac MRI in 3/22 showed EF 28%, diffuse hypokinesis, severe RV dilation with akinetic mid to apical RV wall and RV EF 30%, D-shaped septum, nonspecific RV insertion site LGE.  V/Q scan in 4/22 showed no chronic PE.  High resolution CT chest in 9/22 showed interstitial lung disease consistent with UIP as well as moderate emphysema.  CTA chest in 9/22 showed no PE.  Zio monitor in 9/22 showed PACs, short SVT runs, no atrial fibrillation.   Patient returns for followup of CHF.  Weight up 7 lbs, eating more on prednisone and not moving around much.  He feels like Tyvaso has helped his breathing, does not want to switch to DPI form. Still short of breath walking around his house, uses 4L home oxygen and oxygen saturation will drop with this still.  No chest pain.   No lightheadedness.       Labs (2/22): K 4, creatinine 1.23 => 1.18, hgb 14.2, BNP 1273 Labs (4/22): LDL 48, K 4.3, creatinine 1.48 Labs (9/22): K 3.7,creatinine 1.7 => 1.51, BNP 775 => 247  ECG (personally reviewed): NSR, RBBB  6 minute walk (12/22): 73 m (only walked 2.5 min)  PMH: 1. Atrial flutter: S/p ablation 2/15.  2. Type 2 diabetes 3. HTN 4. Hyperlipidemia 5. Ascending aortic aneurysm: Small, 41 mm on 2/22 echo.  6. Pulmonary fibrosis: Exposure to environmental toxins including asbestos while working [employer].   Fibrosis was found in 2013.  - CT chest (12/21): Progressive ILD in UIP pattern with honeycombing.  - PFTs (2/22): FVC 66%, FEV1 77%, ratio 80%, TLC 47% => restrictive pattern.  - High resolution CT chest (9/22): interstitial lung disease consistent with UIP as well as moderate emphysema. 7. COVID-19 infection 10/21.  8. COPD: Prior smoker.  - High resolution CT chest in 9/22 showed interstitial lung disease consistent with UIP as well as moderate emphysema.  9. Chronic systolic CHF: Echo in 1/42 with EF 25-30%, thought to be tachy-mediated CMP due to atrial flutter.   - Echo (8/16) with EF normal range.  - Echo (2/22) with EF 25-30%, global hypokinesis, moderately decreased RV systolic function (no atrial arrhythmia).  - RHC/LHC (2/22): Mild nonobstructive CAD; mean RA 17, PA 70/27 mean 44, mean PCWP 13, CI 2.13, PVR 8.12.  - Cardiac MRI (3/22): EF  28%, diffuse hypokinesis, severe RV dilation with akinetic mid to apical RV wall and RV EF 30%, D-shaped septum, nonspecific RV insertion site LGE. 10. Pulmonary hypertension: RHC (2/22) with mean RA 17, PA 70/27 mean 44, mean PCWP 13, CI 2.13, PVR 8.12. - V/Q scan: No evidence for chronic PE.  11. OSA  SH: Retired Civil engineer, contracting, married, lives in [place].  Enjoys fishing.  Remote smoker, no ETOH.   Family History  Problem Relation Age of Onset   Coronary artery disease Sister    Heart failure  Mother    ROS: All systems reviewed and negative except as per HPI.   Current Outpatient Medications  Medication Sig Dispense Refill   albuterol (VENTOLIN HFA) 108 (90 Base) MCG/ACT inhaler Inhale 2 puffs into the lungs every 4 (four) hours as needed for shortness of breath or wheezing. 18 g 0   allopurinol (ZYLOPRIM) 100 MG tablet TAKE 1 TABLET(100 MG) BY MOUTH DAILY 90 tablet 3   aspirin 81 MG chewable tablet Chew 81 mg by mouth daily.     atorvastatin (LIPITOR) 20 MG tablet TAKE 1 TABLET BY MOUTH DAILY 90 tablet 3   Cyanocobalamin (B-12 PO) Take 1,000 mcg by mouth daily.      [name] 10 MG TABS tablet TAKE 1 TABLET(10 MG) BY MOUTH DAILY BEFORE BREAKFAST 30 tablet 11   metoprolol succinate (TOPROL-XL) 25 MG 24 hr tablet TAKE 1 TABLET(25 MG) BY MOUTH DAILY 30 tablet 2   potassium chloride SA (KLOR-CON) 20 MEQ tablet Take 1 tablet (20 mEq total) by mouth daily. 90 tablet 3   predniSONE (DELTASONE) 10 MG tablet Take 1 tablet (10 mg total) by mouth daily with breakfast. 30 tablet 2   pregabalin (LYRICA) 50 MG capsule Take 1 capsule by mouth in the morning and 1 capsule by mouth at bedtime 180 capsule 0   Treprostinil (TYVASO) 0.6 MG/ML SOLN Inhale 18 mcg into the lungs 4 (four) times daily.     furosemide (LASIX) 40 MG tablet Take 1.5 tablets (60 mg total) by mouth in the morning AND 1 tablet (40 mg total) every other day. In PM. 60 tablet 11   spironolactone (ALDACTONE) 25 MG tablet Take 1 tablet (25 mg total) by mouth at bedtime. 30 tablet 11   No current facility-administered medications for this encounter.   BP 112/74   Pulse 78   Wt 79.1 kg (174 lb 6.4 oz)   SpO2 98% Comment: 4 L [place]  BMI 29.94 kg/m  General: NAD, wearing oxygen Neck: No JVD, no thyromegaly or thyroid nodule.  Lungs: Dry crackles at bases.  CV: Nondisplaced PMI.  Heart regular S1/S2, no S3/S4, no murmur.  No peripheral edema.  No carotid bruit.  Normal pedal pulses.  Abdomen: Soft, nontender, no  hepatosplenomegaly, no distention.  Skin: Intact without lesions or rashes.  Neurologic: Alert and oriented x 3.  Psych: Normal affect. Extremities: No clubbing or cyanosis.  HEENT: Normal.   Assessment/Plan: 1. Chronic systolic CHF: Nonischemic cardiomyopathy, cath in 2/22 with nonobstructive CAD.  In past, he was thought to have tachy-mediated CMP.  However, he has not had any recent prolonged fibrillation or flutter, so this does not seem to be the culprit for the recurrent drop in EF. Echo in 2/22 showed biventricular failure with LV EF 25-30% and moderate RV dysfunction (RVH on echo).  RHC in 2/22 showed elevated right-sided filling pressure and pulmonary arterial hypertension.  Cardiac MRI in 3/22 showed EF 28%, diffuse hypokinesis, severe  RV dilation with akinetic mid to apical RV wall and RV EF 30%, D-shaped septum, nonspecific RV insertion site LGE.  RV failure most likely is cor pulmonale with pulmonary fibrosis and pulmonary hypertension, but regional wall motion abnormalities (apex and mid-wall RV akinesis) brings up concern for ARVC (though no evidence for fibrofatty RV infiltration on MRI).  Today, NYHA class IIIb symptoms, but he is not volume overloaded on exam.  I suspect that the main issue here causing dyspnea is pulmonary fibrosis and emphysema.   - Continue Lasix 60 mg qam, 40 mg every other pm.  BMET/BNP today.  - Off Entresto due to low BP, consider valsartan at next appt.    - Continue Toprol XL 25 mg daily, take at night given soft BP.   - Continue [name] 10 mg daily.  - Increase spironolactone to 25 mg daily, take at night given soft BP. BMET in 10 days.  - Given age, would not pursue ICD.  Narrow QRS so not CRT candidate.  2. 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 PVR 8.1 on 2/22 cath. V/Q scan not suggestive of chronic PE. Possible group 1 PH component.  PH is contributing to RV  systolic dysfunction.  He feels like Tyvaso has helped his breathing, but still very symptomatic.  - Continue Tyvaso, he does not want to switch to DPI.     - Continue use CPAP.  3. Pulmonary fibrosis: Restrictive PFTs in 2/22.  CT chest in 12/21 with progressive pulmonary fibrosis and honeycombing.  COVID-19 lung involvement could have worsened pulmonary parenchymal disease.  High resolution CT chest in 9/22 showed interstitial lung disease consistent with UIP as well as moderate emphysema.   - He follows with pulmonary, would he be a candidate for antifibrotic therapy?  - Continue home oxygen.  4. Atrial flutter: Noted in 1/15, had ablation in 2/15, no atrial fibrillation documented since that time.  He is in NSR today.  He is not anticoagulated.  5. COPD: Emphysema on chest CT. Breathing considerably better with prednisone. He wants to continue long-term.  He needs to followup with pulmonary.   Followup in 2 months with APP, will see if we can start valsartan at that time (BP did not tolerate Entresto).   Loralie Champagne 10/02/2021

## 2021-10-06 DIAGNOSIS — D692 Other nonthrombocytopenic purpura: Secondary | ICD-10-CM | POA: Diagnosis not present

## 2021-10-06 DIAGNOSIS — J84112 Idiopathic pulmonary fibrosis: Secondary | ICD-10-CM | POA: Diagnosis not present

## 2021-10-06 DIAGNOSIS — I110 Hypertensive heart disease with heart failure: Secondary | ICD-10-CM | POA: Diagnosis not present

## 2021-10-06 DIAGNOSIS — J449 Chronic obstructive pulmonary disease, unspecified: Secondary | ICD-10-CM | POA: Diagnosis not present

## 2021-10-06 DIAGNOSIS — Z7951 Long term (current) use of inhaled steroids: Secondary | ICD-10-CM | POA: Diagnosis not present

## 2021-10-06 DIAGNOSIS — Z8616 Personal history of COVID-19: Secondary | ICD-10-CM | POA: Diagnosis not present

## 2021-10-06 DIAGNOSIS — Z7952 Long term (current) use of systemic steroids: Secondary | ICD-10-CM | POA: Diagnosis not present

## 2021-10-06 DIAGNOSIS — Z9981 Dependence on supplemental oxygen: Secondary | ICD-10-CM | POA: Diagnosis not present

## 2021-10-06 DIAGNOSIS — I272 Pulmonary hypertension, unspecified: Secondary | ICD-10-CM | POA: Diagnosis not present

## 2021-10-06 DIAGNOSIS — I739 Peripheral vascular disease, unspecified: Secondary | ICD-10-CM | POA: Diagnosis not present

## 2021-10-06 DIAGNOSIS — I5022 Chronic systolic (congestive) heart failure: Secondary | ICD-10-CM | POA: Diagnosis not present

## 2021-10-06 DIAGNOSIS — J9610 Chronic respiratory failure, unspecified whether with hypoxia or hypercapnia: Secondary | ICD-10-CM | POA: Diagnosis not present

## 2021-10-10 ENCOUNTER — Other Ambulatory Visit: Payer: Self-pay | Admitting: Pulmonary Disease

## 2021-10-12 ENCOUNTER — Other Ambulatory Visit
Admission: RE | Admit: 2021-10-12 | Discharge: 2021-10-12 | Disposition: A | Discharge status: Home / Self Care | Payer: PPO | Source: Ambulatory Visit | Attending: Cardiology | Admitting: Cardiology

## 2021-10-12 DIAGNOSIS — N183 Chronic kidney disease, stage 3 unspecified: Secondary | ICD-10-CM | POA: Diagnosis not present

## 2021-10-12 DIAGNOSIS — E1122 Type 2 diabetes mellitus with diabetic chronic kidney disease: Secondary | ICD-10-CM | POA: Insufficient documentation

## 2021-10-12 DIAGNOSIS — I5022 Chronic systolic (congestive) heart failure: Secondary | ICD-10-CM

## 2021-10-12 LAB — BASIC METABOLIC PANEL
Anion gap: 7 (ref 5–15)
BUN: 29 mg/dL — ABNORMAL HIGH (ref 8–23)
CO2: 34 mmol/L — ABNORMAL HIGH (ref 22–32)
Calcium: 9.2 mg/dL (ref 8.9–10.3)
Chloride: 95 mmol/L — ABNORMAL LOW (ref 98–111)
Creatinine, Ser: 1.49 mg/dL — ABNORMAL HIGH (ref 0.61–1.24)
GFR, Estimated: 46 mL/min — ABNORMAL LOW (ref >60)
Glucose, Bld: 124 mg/dL — ABNORMAL HIGH (ref 70–99)
Potassium: 4.3 mmol/L (ref 3.5–5.1)
Sodium: 136 mmol/L (ref 135–145)

## 2021-10-20 DIAGNOSIS — R0902 Hypoxemia: Secondary | ICD-10-CM | POA: Diagnosis not present

## 2021-10-20 DIAGNOSIS — J1282 Pneumonia due to coronavirus disease 2019: Secondary | ICD-10-CM | POA: Diagnosis not present

## 2021-10-26 ENCOUNTER — Telehealth: Payer: Self-pay | Admitting: Family Medicine

## 2021-10-26 DIAGNOSIS — M1A00X1 Idiopathic chronic gout, unspecified site, with tophus (tophi): Secondary | ICD-10-CM

## 2021-10-26 DIAGNOSIS — E538 Deficiency of other specified B group vitamins: Secondary | ICD-10-CM

## 2021-10-26 DIAGNOSIS — E1151 Type 2 diabetes mellitus with diabetic peripheral angiopathy without gangrene: Secondary | ICD-10-CM

## 2021-10-26 NOTE — Telephone Encounter (Signed)
-----  Message from Ellamae Sia sent at 10/18/2021  7:41 AM EST ----- Regarding: Lab orders for Tuesday, 1.3.23 Lab orders for a 6 month follow up appt

## 2021-11-01 ENCOUNTER — Other Ambulatory Visit: Payer: PPO

## 2021-11-08 ENCOUNTER — Ambulatory Visit: Payer: PPO | Admitting: Family Medicine

## 2021-11-18 DIAGNOSIS — J8410 Pulmonary fibrosis, unspecified: Secondary | ICD-10-CM | POA: Diagnosis not present

## 2021-11-18 DIAGNOSIS — E1151 Type 2 diabetes mellitus with diabetic peripheral angiopathy without gangrene: Secondary | ICD-10-CM | POA: Diagnosis not present

## 2021-11-18 DIAGNOSIS — Z6827 Body mass index (BMI) 27.0-27.9, adult: Secondary | ICD-10-CM | POA: Diagnosis not present

## 2021-11-18 DIAGNOSIS — I5022 Chronic systolic (congestive) heart failure: Secondary | ICD-10-CM | POA: Diagnosis not present

## 2021-11-18 DIAGNOSIS — F3340 Major depressive disorder, recurrent, in remission, unspecified: Secondary | ICD-10-CM | POA: Diagnosis not present

## 2021-11-18 DIAGNOSIS — D692 Other nonthrombocytopenic purpura: Secondary | ICD-10-CM | POA: Diagnosis not present

## 2021-11-18 DIAGNOSIS — N1831 Chronic kidney disease, stage 3a: Secondary | ICD-10-CM | POA: Diagnosis not present

## 2021-11-18 DIAGNOSIS — J449 Chronic obstructive pulmonary disease, unspecified: Secondary | ICD-10-CM | POA: Diagnosis not present

## 2021-11-18 DIAGNOSIS — E1122 Type 2 diabetes mellitus with diabetic chronic kidney disease: Secondary | ICD-10-CM | POA: Diagnosis not present

## 2021-11-18 DIAGNOSIS — I272 Pulmonary hypertension, unspecified: Secondary | ICD-10-CM | POA: Diagnosis not present

## 2021-11-18 DIAGNOSIS — I4892 Unspecified atrial flutter: Secondary | ICD-10-CM | POA: Diagnosis not present

## 2021-11-18 DIAGNOSIS — F0390 Unspecified dementia without behavioral disturbance: Secondary | ICD-10-CM | POA: Diagnosis not present

## 2021-11-20 DIAGNOSIS — R0902 Hypoxemia: Secondary | ICD-10-CM | POA: Diagnosis not present

## 2021-11-20 DIAGNOSIS — J1282 Pneumonia due to coronavirus disease 2019: Secondary | ICD-10-CM | POA: Diagnosis not present

## 2021-11-30 ENCOUNTER — Telehealth: Payer: Self-pay

## 2021-11-30 NOTE — Chronic Care Management (AMB) (Addendum)
° ° °  Chronic Care Management Pharmacy Assistant   Name: Caleb Bryant [facility] Sr[facility]  MRN: [id number] DOB: Jan 18, 1934  Reason for Encounter: CCM Reminder Call   Conditions to be addressed/monitored: HTN, HLD, DMII, and CKD Stage 3   Medications: Outpatient Encounter Medications as of 11/30/2021  Medication Sig   albuterol (VENTOLIN HFA) 108 (90 Base) MCG/ACT inhaler Inhale 2 puffs into the lungs every 4 (four) hours as needed for shortness of breath or wheezing.   allopurinol (ZYLOPRIM) 100 MG tablet TAKE 1 TABLET(100 MG) BY MOUTH DAILY   aspirin 81 MG chewable tablet Chew 81 mg by mouth daily.   atorvastatin (LIPITOR) 20 MG tablet TAKE 1 TABLET BY MOUTH DAILY   Cyanocobalamin (B-12 PO) Take 1,000 mcg by mouth daily.    furosemide (LASIX) 40 MG tablet Take 1.5 tablets (60 mg total) by mouth in the morning AND 1 tablet (40 mg total) every other day. In PM.   [name] 10 MG TABS tablet TAKE 1 TABLET(10 MG) BY MOUTH DAILY BEFORE BREAKFAST   metoprolol succinate (TOPROL-XL) 25 MG 24 hr tablet TAKE 1 TABLET(25 MG) BY MOUTH DAILY   potassium chloride SA (KLOR-CON) 20 MEQ tablet Take 1 tablet (20 mEq total) by mouth daily.   predniSONE (DELTASONE) 10 MG tablet TAKE 1 TABLET(10 MG) BY MOUTH DAILY WITH BREAKFAST   pregabalin (LYRICA) 50 MG capsule Take 1 capsule by mouth in the morning and 1 capsule by mouth at bedtime   spironolactone (ALDACTONE) 25 MG tablet Take 1 tablet (25 mg total) by mouth at bedtime.   Treprostinil (TYVASO) 0.6 MG/ML SOLN Inhale 18 mcg into the lungs 4 (four) times daily.   No facility-administered encounter medications on file as of 11/30/2021.   Caleb [facility] Sr.  Did not answer the telephone to remind of upcoming telephone visit with Debbora Dus on 12/05/21 at 9:00am. Patient was reminded to have all medications, supplements and any blood glucose and blood pressure readings available for review at appointment. If unable to reach, a voicemail was left for patient.     Star Rating Drugs: Medication:  Last Fill: Day Supply Atorvastatin 83m 10/07/21 90 [name] 184m12/8/22 30  MiDebbora DusCPP notified  VeAvel SensorCCElmwood Placessistant [phone] have reviewed the care management and care coordination activities outlined in this encounter and I am certifying that I agree with the content of this note. No further action required.  MiDebbora DusPharmD Clinical Pharmacist [id number] [phone]

## 2021-12-02 ENCOUNTER — Ambulatory Visit: Payer: PPO | Admitting: Pulmonary Disease

## 2021-12-02 ENCOUNTER — Encounter: Payer: Self-pay | Admitting: Pulmonary Disease

## 2021-12-02 ENCOUNTER — Other Ambulatory Visit: Payer: Self-pay

## 2021-12-02 VITALS — BP 128/80 | HR 69 | Temp 97.1°F | Ht 64.0 in | Wt 179.2 lb

## 2021-12-02 DIAGNOSIS — I272 Pulmonary hypertension, unspecified: Secondary | ICD-10-CM

## 2021-12-02 DIAGNOSIS — J8410 Pulmonary fibrosis, unspecified: Secondary | ICD-10-CM | POA: Diagnosis not present

## 2021-12-02 DIAGNOSIS — J9611 Chronic respiratory failure with hypoxia: Secondary | ICD-10-CM

## 2021-12-02 DIAGNOSIS — Z66 Do not resuscitate: Secondary | ICD-10-CM | POA: Diagnosis not present

## 2021-12-02 DIAGNOSIS — J449 Chronic obstructive pulmonary disease, unspecified: Secondary | ICD-10-CM | POA: Diagnosis not present

## 2021-12-02 MED ORDER — BREZTRI AEROSPHERE 160-9-4.8 MCG/ACT IN AERO: 2.0000 | INHALATION_SPRAY | Freq: Two times a day (BID) | RESPIRATORY_TRACT | 0 refills | Status: AC

## 2021-12-02 NOTE — Patient Instructions (Signed)
Resume taking the Breztri.  This will be 2 puffs twice a day, make sure you rinse your mouth well after use.  For your oxygen and you can do 2 L at rest meaning when you are watching TV or reading or sitting quietly, and do 4 L with activity.  As always, if he is still short of breath with activity you can go up to 6 L.  We are going to check an oxygen level at nighttime to see how his level is.  On the day of the test decrease the oxygen level at 2 L to see how he does on that.  You may resume 4 L/min until we call back with the results.  We will see him in follow-up in 6 to 8 weeks time call sooner should any new problems arise.

## 2021-12-02 NOTE — Progress Notes (Signed)
Subjective:    Patient ID: Caleb Bracco Sr., male    DOB: 1934/05/15, 88 y.o.   MRN: [id number] Chief Complaint  Patient presents with   Follow-up   HPI Patient is a very complex 88 year old former smoker (quit 1980, 92 PY) who presents for follow-up on the issue of shortness of breath and chronic respiratory failure with hypoxia.  This is a scheduled visit.  He was last seen on 01 September 2021 by me.  He has interstitial lung disease/pulmonary fibrosis with concomitant COPD as well.  He has had issues with chronic cough worse since his COVID-19 diagnosis in October 2021.  Initially, he noted  improvement of his cough with Breztri inhaler which was provided for him during his initial visit here on January 2022.  However, Judithann Sauger became ineffective and he discontinued the medication.  He was given a trial of Trelegy Ellipta but this,as well, did not give him any relief.  He had a 2 D echo on 30 November 2020 which revealed an EF of 25 to 30%, global hypokinesis, DD grade I, mild to moderate tricuspid valve regurgitation and evidence suggestive of pulmonary hypertension. He was referred back to cardiology (Dr. Fletcher Anon) for potential right heart cath.  He underwent right heart cath which showed severe pulmonary hypertension with mildly reduced cardiac output.  Since then he has been seen at the advanced heart failure clinic at [facility] in [place].  He was placed on Tyvaso and initially noticed some improvement on his dyspnea with this however this is also becoming ineffective.  He is also on Lasix.  He has noted some hair loss since he started on the Tyvaso which is rare side effect.   Since his last visit with me in November, he has had issues with oxygen desaturations and increasing dyspnea.  He was evaluated with a CT angio which did not show PE.  His interstitial lung disease is stable.  He had adjustments to his diuretics and this has helped some with his symptoms.  He was continued on a  standing dose prednisone which also has helped him.  He would like to [employer] again.  He does not endorse any other complaint today.  We discussed end-of-life issues again today and he continues to note that he is DNR/DNI   DATA: 01/25/2016 spirometry: FEV1 1.7 L or 62% predicted, FVC 2.4 L or 64% predicted.  FEV1/FVC 73%.  FEF 25-75% 1.4 L or 56% predicted.  Suspect combined restrictive/obstructive physiology. 01/28/2016 CT chest: Nonspecific interstitial lung disease present with peripheral distribution. 10/13/2020 CT chest: Marked interval progression of underlying interstitial lung disease demonstrating peripheral and basilar predominant architectural distortion, honeycombing and subpleural reticulation.  Basilar groundglass pulmonary infiltrates, bullous emphysema, findings consistent with UIP plus emphysema 11/16/2020 CTD work-up: RF 14.7,  ANA negative, RP 0.6, ESR 3 11/30/2020 2D echo: LVEF 25 to 30%, global hypokinesis of the LV, DD grade I, RV systolic function moderately reduced, right ventricular size moderately enlarged, dilated pulmonary artery 12/03/2020 PFTs: FEV1 1.62 L or 77% predicted, FVC 2.02 L or 66% predicted, FEV1/FVC 80%, no bronchodilator response, lung volumes moderately to severely reduced, FEF 25-75 % 64%.  Diffusion capacity moderately to severely reduced at 55% 12/17/2020 right and left heart cath: ost RCA to proximal axillary lesion is 20% stenosed, proximal RCA lesion is 30% stenosis, mid RCA lesion 20% stenosed. RA: 15/22 with a mean of 17 mmHg RV: 70 over 9 mmHg PCW: 13 mmHg PA: 70/27 with a mean of 44  mmHg LVEDP: 13 mmHg. PA sat was 65.7% and aortic sat was 96.5%. Cardiac output was 3.82 with a cardiac index of 2.13. Pulmonary vascular resistance was 8.12 Woods units 12/21/2020 BNP: 1,273 01/18/2021 cardiac MRI: EF of 28% diffuse hypokinesis, severe RV dilatation with a connective mid to apical RV wall RV EF of 30% D-shaped septum suggestive of RV  pressure/volume overload with a EF of 20%.  No RV thrombus.  Consistent with biventricular failure though RV looks worse 02/02/2021 pulmonary perfusion: No acute or chronic blood clot 07/07/2021 CT chest high-resolution: No progression of fibrosis, spectrum of findings consistent with UIP.  Moderate centrilobular and paraseptal emphysema, cardiomegaly 07/11/2021 CT angio chest: Negative for PE.  Unchanged moderate pulmonary fibrosis, emphysema, CAD   Review of Systems A 10 point review of systems was performed and it is as noted above otherwise negative.  Patient Active Problem List   Diagnosis Date Noted   Pulmonary HTN ([place]) 93/23/5573   Acute systolic heart failure ([place])    Hypoxia 08/20/2020   CKD (chronic kidney disease), stage II    Acute respiratory failure with hypoxia (HCC)    Stage 3a chronic kidney disease (HCC)    Hypokalemia    Type 2 diabetes mellitus with hyperlipidemia (HCC)    GERD (gastroesophageal reflux disease) 10/17/2016   CKD stage 3 due to type 2 diabetes mellitus ([place]) 10/04/2016   Idiopathic pulmonary fibrosis ([place]) 02/25/2016   Thoracic aortic aneurysm without rupture 02/01/2016   Chronic cough 01/25/2016   Chronic obstructive pulmonary disease ([place]) 01/25/2016   Midline thoracic back pain 10/19/2015   Advanced directives, counseling/discussion 10/14/2015   History of atrial flutter 12/21/2014   Ventral hernia without obstruction or gangrene 22/11/5425   Chronic systolic CHF (congestive heart failure) ([place]) 12/23/2013   Intermediate coronary syndrome ([place]) 11/04/2013   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   DDD (degenerative disc disease), cervical 03/31/2013   Fuchs' corneal dystrophy 02/19/2013   Neuropathy due to secondary diabetes mellitus ([place]) 12/31/2012   Gout, chronic, 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   DM (diabetes mellitus) with peripheral vascular complication ([place]) 04/22/7627   Shoulder pain, bilateral 11/06/2011   Vitamin B12 deficiency 03/11/2009    INTERMITTENT VERTIGO 03/11/2009   Mild dementia 09/01/2008   HYPERCHOLESTEROLEMIA 11/29/2007   ALLERGIC RHINITIS 11/29/2007   Essential hypertension 11/14/2007   OSTEOARTHRITIS 06/13/2007   Peripheral vascular disease due to secondary diabetes mellitus ([place]) 06/12/2007   DIVERTICULOSIS, COLON 06/12/2007   BENIGN PROSTATIC HYPERTROPHY 06/12/2007   Social History   Tobacco Use   Smoking status: Former    Packs/day: 3.00    Years: 30.00    Pack years: 90.00    Types: Cigarettes    Quit date: 1980    Years since quitting: 43.1   Smokeless tobacco: Former    Types: Chew   Tobacco comments:    11/04/2013 "quit smoking in the 1980's or so; quit chewing before I quit smoking"  Substance Use Topics   Alcohol use: No    Comment: 11/04/2013 "quit driking in 1980's; was called a weekend alcoholic"   Allergies  Allergen Reactions   Codeine Nausea And Vomiting   Current Meds  Medication Sig   albuterol (VENTOLIN HFA) 108 (90 Base) MCG/ACT inhaler Inhale 2 puffs into the lungs every 4 (four) hours as needed for shortness of breath or wheezing.   allopurinol (ZYLOPRIM) 100 MG tablet TAKE 1 TABLET(100 MG) BY MOUTH DAILY   aspirin 81 MG chewable tablet Chew 81 mg by mouth daily.   atorvastatin (  LIPITOR) 20 MG tablet TAKE 1 TABLET BY MOUTH DAILY   Cyanocobalamin (B-12 PO) Take 1,000 mcg by mouth daily.    furosemide (LASIX) 40 MG tablet Take 1.5 tablets (60 mg total) by mouth in the morning AND 1 tablet (40 mg total) every other day. In PM.   [name] 10 MG TABS tablet TAKE 1 TABLET(10 MG) BY MOUTH DAILY BEFORE BREAKFAST   potassium chloride SA (KLOR-CON) 20 MEQ tablet Take 1 tablet (20 mEq total) by mouth daily.   predniSONE (DELTASONE) 10 MG tablet TAKE 1 TABLET(10 MG) BY MOUTH DAILY WITH BREAKFAST   pregabalin (LYRICA) 50 MG capsule Take 1 capsule by mouth in the morning and 1 capsule by mouth at bedtime   spironolactone (ALDACTONE) 25 MG tablet Take 1 tablet (25 mg total) by mouth at  bedtime.   Treprostinil (TYVASO) 0.6 MG/ML SOLN Inhale 18 mcg into the lungs 4 (four) times daily.   [DISCONTINUED] metoprolol succinate (TOPROL-XL) 25 MG 24 hr tablet TAKE 1 TABLET(25 MG) BY MOUTH DAILY   Immunization History  Administered Date(s) Administered   Influenza Split 07/31/2011, 08/08/2012, 09/01/2013   Influenza Whole 10/05/2004, 08/16/2007, 08/11/2008, 08/06/2009   Influenza, High Dose Seasonal PF 07/27/2016, 08/09/2017, 08/01/2018, 07/25/2019, 07/09/2020   Influenza,inj,Quad PF,6+ Mos 06/22/2014, 10/14/2015   Influenza-Unspecified 08/30/2018, 08/23/2021   Pneumococcal Conjugate-13 06/22/2014   Pneumococcal Polysaccharide-23 04/14/2009   Td 10/31/1997, 05/13/2010, 12/05/2019   CODE STATUS: DNR/DNI     Objective:   Physical Exam BP 128/80 (BP Location: Left Arm, Patient Position: Sitting, Cuff Size: Normal)   Pulse 69   Temp (!) 97.1 F (36.2 C) (Oral)   Ht _0  (1.626 m)   Wt 179 lb 3.2 oz (81.3 kg)   SpO2 100%   BMI 30.76 kg/m  GENERAL: Well-developed well-nourished gentleman, no acute distress on 4 L nasal cannula O2.  No conversational dyspnea.  Presents in transport chair due to dyspnea. HEAD: Normocephalic, atraumatic. EYES: Pupils equal, round, reactive to light.  No scleral icterus.  Corneal opacity right. MOUTH: Nose/mouth/throat not examined due to masking requirements for COVID 19. NECK: Supple. No thyromegaly. Trachea midline. No JVD.  No adenopathy. PULMONARY: Good air entry bilaterally.  Bilateral Velcro crackles at the bases, no adventitious sounds. CARDIOVASCULAR: S1 and S2. Regular rate and rhythm.  Grade 2/6 systolic ejection murmur at the aortic area.  No gallops or rubs noted. ABDOMEN: Benign. MUSCULOSKELETAL: Osteoarthritis changes both hands, no clubbing, no edema. NEUROLOGIC: No focal deficit, no gait disturbance, fluent speech  SKIN: Intact,warm,dry.  On limited exam no rashes. PSYCH: Mood and behavior normal.   Ambulatory oximetry for  O2 titration purposes: Patient doing well at 2 L/min at rest requires 4 L/min with ambulation.    Assessment & Plan:     ICD-10-CM   1. Chronic obstructive pulmonary disease moderate to severe  J44.9 AMB referral to pulmonary rehabilitation    CANCELED: Pulse oximetry, overnight   Resume Breztri 2 puffs twice a day Pulmonary rehab Will look into life 2000 ventilation system    2. Chronic respiratory failure with hypoxia (HCC)  J96.11 AMB referral to pulmonary rehabilitation    CANCELED: Pulse oximetry, overnight   Titrated his O2 to 4 L/min with ambulation Continue 2 L/min with rest and sleep Investigating the possibility of portable ventilation assistive device    3. Pulmonary fibrosis (HCC)  J84.10    No evidence of progression by CT    4. Pulmonary HTN (HCC)  I27.20    Multifactorial: Pulmonary disease/cardiac disease  5. DNR (do not resuscitate)  Z66    Discussed EOL issues today Patient reiterated she is DNI/DNI     Meds ordered this encounter  Medications   Budeson-Glycopyrrol-Formoterol (BREZTRI AEROSPHERE) 160-9-4.8 MCG/ACT AERO    Sig: Inhale 2 puffs into the lungs in the morning and at bedtime.    Dispense:  5.9 g    Refill:  0    Order Specific Question:   Lot Number?    Answer:   [id number]    Order Specific Question:   Expiration Date?    Answer:   08/30/2023    Order Specific Question:   Manufacturer?    Answer:   [employer] [[id number]]    Order Specific Question:   NDC    Answer:   6808-8110-31 [[id number]]    Order Specific Question:   Quantity    Answer:   4   Orders Placed This Encounter  Procedures   Pulse oximetry, overnight    On 2L of o2    Standing Status:   Future    Standing Expiration Date:   12/02/2022    Will see see the patient in follow-up in 6 to 8 weeks time he is to contact us prior to that time should any new difficulties arise.  Renold Don, MD Advanced Bronchoscopy PCCM [facility]    *This note was  dictated using voice recognition software/Dragon.  Despite best efforts to proofread, errors can occur which can change the meaning. Any transcriptional errors that result from this process are unintentional and may not be fully corrected at the time of dictation.

## 2021-12-05 ENCOUNTER — Other Ambulatory Visit: Payer: Self-pay

## 2021-12-05 ENCOUNTER — Ambulatory Visit (INDEPENDENT_AMBULATORY_CARE_PROVIDER_SITE_OTHER): Payer: PPO

## 2021-12-05 ENCOUNTER — Telehealth: Payer: Self-pay

## 2021-12-05 DIAGNOSIS — E7800 Pure hypercholesterolemia, unspecified: Secondary | ICD-10-CM

## 2021-12-05 DIAGNOSIS — E1151 Type 2 diabetes mellitus with diabetic peripheral angiopathy without gangrene: Secondary | ICD-10-CM

## 2021-12-05 DIAGNOSIS — I10 Essential (primary) hypertension: Secondary | ICD-10-CM

## 2021-12-05 NOTE — Telephone Encounter (Signed)
Reviewed and sent to Laurita Quint to print and give to patient on or prior to PCP appt 12/20/21.

## 2021-12-05 NOTE — Progress Notes (Signed)
° ° °  Chronic Care Management Pharmacy Assistant   Name: Caleb Bryant [facility] Sr.  MRN: [id number] DOB: [birth year]  Reason for Encounter: CCM Vania Rea 2023 Patient Assistance)   Patient assistance forms for [name] have been completed and uploaded. Patient would like to sign the forms at upcoming PCP visit.   Debbora Dus, CPP notified  Marijean Niemann, [place] Clinical Pharmacy Assistant [phone]  Time Spent: 30 Minutes

## 2021-12-05 NOTE — Progress Notes (Signed)
Chronic Care Management Pharmacy Note  12/05/2021 Name:  Caleb Bryant [facility] MRN:  [id number] DOB:  1934-08-04  Summary: CCM follow up visit. Encouraged to schedule PCP visit (due). Reviewed HTN, DM, and HLD. All stable, controlled. Pt is back on daily atorvastatin, tolerates well. Continues [name] 10 mg daily for CKD/DM. A1c stable < 7%, due for re-eval. Does not home monitor BG. HTN stable in clinic, does not home monitor. COPD and CHF are closely monitoring by speciality clinics. Pt needs cost assistance with [name]. Will fill out application for pt to sign at upcoming PCP visit. Income above limits for LIS.   Recommendations/Changes made from today's visit: Fill out PAP for [name] 10 mg   Plan: CCM PharmD follow up 6 months  [place] to complete PAP forms   Subjective: Caleb Schweiss Royle Sr. is an 88 y.o. year old male who is a primary patient of Bedsole, Amy E, MD.  The CCM team was consulted for assistance with disease management and care coordination needs.    Engaged with patient by telephone for follow up visit in response to provider referral for pharmacy case management and/or care coordination services. Spoke with patient's wife, Caleb Bryant, as patient is hard of hearing.  Consent to Services:  The patient was given information about Chronic Care Management services, agreed to services, and gave verbal consent prior to initiation of services.  Please see initial visit note for detailed documentation.   Patient Care Team: [name], MD as PCP - General Byrnett, Forest Gleason, MD (General Surgery) Venia Carbon, MD as Referring Physician (Internal Medicine) Agapito Games as Consulting Physician (Optometry) Debbora Dus, [facility] as Pharmacist (Pharmacist)  Recent office visits: 03/04/21 - Eliezer Lofts, MD,  PCP - Pt presented for routine medical exam. HLD, trial lower dose statin (3 days per week) to see if aches improve. DM, controlled on  [name] 10 mg. Neuropathy, consider repeat ABI. Start Lyrica and titrate. Follow up Nov 2022.  Recent consult visits: 12/02/21 - Vernard Gambles, MD, Pulmonology - Pt presented for COPD follow up. Restart Breztri, sample provided. Follow up 6-8 weeks. 09/30/21 - Loralie Champagne, MD, Cardiology - Pt presented for CHF follow up. Increase spironolactone to 25 mg daily at bedtime. Follow up 2 months. 09/01/21 - Pulmonology - Pt presented for IPF, COPD. Discontinue Breztri, trial Trelegy.  Hospital visits: 07/11/21 - ED, pulmonary fibrosis    Objective:  Lab Results  Component Value Date   CREATININE 1.49 (H) 10/12/2021   BUN 29 (H) 10/12/2021   GFR 42.97 (L) 02/25/2021   GFRNONAA 46 (L) 10/12/2021   GFRAA 62 12/09/2020   NA 136 10/12/2021   K 4.3 10/12/2021   CALCIUM 9.2 10/12/2021   CO2 34 (H) 10/12/2021   GLUCOSE 124 (H) 10/12/2021    Lab Results  Component Value Date/Time   HGBA1C 6.5 02/25/2021 10:04 AM   HGBA1C 6.5 (H) 07/31/2020 07:26 AM   GFR 42.97 (L) 02/25/2021 10:04 AM   GFR 44.53 (L) 10/05/2020 03:22 PM   MICROALBUR 2.4 (H) 11/22/2018 08:29 AM   MICROALBUR 1.3 11/15/2017 08:33 AM    Last diabetic Eye exam:  Lab Results  Component Value Date/Time   HMDIABEYEEXA No Retinopathy 07/15/2015 12:00 AM    Last diabetic Foot exam:  Lab Results  Component Value Date/Time   HMDIABFOOTEX done 12/05/2019 12:00 AM     Lab Results  Component Value Date   CHOL 111 02/25/2021   HDL 51.40 02/25/2021   LDLCALC 48  02/25/2021   TRIG 55.0 02/25/2021   CHOLHDL 2 02/25/2021    Hepatic Function Latest Ref Rng & Units 02/25/2021 10/05/2020 08/03/2020  Total Protein 6.0 - 8.3 g/dL 7.2 7.4 6.7  Albumin 3.5 - 5.2 g/dL 4.1 4.3 3.5  AST 0 - 37 U/L 25 24 44(H)  ALT 0 - 53 U/L _0 Alk Phosphatase 39 - 117 U/L 80 84 54  Total Bilirubin 0.2 - 1.2 mg/dL 1.0 1.2 0.9  Bilirubin, Direct 0.0 - 0.2 mg/dL - - -    Lab Results  Component Value Date/Time   TSH 1.003 07/11/2021 04:08 PM    TSH 1.56 10/05/2020 03:22 PM   TSH 2.603 11/04/2013 11:15 PM   FREET4 0.88 07/11/2021 04:08 PM    CBC Latest Ref Rng & Units 07/11/2021 12/09/2020 10/05/2020  WBC 4.0 - 10.5 K/uL 10.5 11.7(H) 12.5(H)  Hemoglobin 13.0 - 17.0 g/dL 14.7 14.2 16.1  Hematocrit 39.0 - 52.0 % 42.7 41.0 48.3  Platelets 150 - 400 K/uL 281 272 351.0    No results found for: VD25OH  Clinical ASCVD: Yes  The ASCVD Risk score (Arnett DK, et al., 2019) failed to calculate for the following reasons:   The 2019 ASCVD risk score is only valid for ages 60 to 13    Depression screen PHQ 2/9 02/14/2021 12/05/2019 11/22/2018  Decreased Interest 0 0 0  Down, Depressed, Hopeless 0 0 0  PHQ - 2 Score 0 0 0  Altered sleeping 0 - 0  Tired, decreased energy 0 - 0  Change in appetite 0 - 0  Feeling bad or failure about yourself  0 - 0  Trouble concentrating 0 - 0  Moving slowly or fidgety/restless 0 - 0  Suicidal thoughts 0 - 0  PHQ-9 Score 0 - 0  Difficult doing work/chores Not difficult at all - Not difficult at all  Some recent data might be hidden    Social History   Tobacco Use  Smoking Status Former   Packs/day: 3.00   Years: 30.00   Pack years: 90.00   Types: Cigarettes   Quit date: 1980   Years since quitting: 43.1  Smokeless Tobacco Former   Types: Chew  Tobacco Comments   11/04/2013 "quit smoking in the 1980's or so; quit chewing before I quit smoking"   BP Readings from Last 3 Encounters:  12/02/21 128/80  09/30/21 112/74  09/01/21 100/64   Pulse Readings from Last 3 Encounters:  12/02/21 69  09/30/21 78  09/01/21 89   Wt Readings from Last 3 Encounters:  12/02/21 179 lb 3.2 oz (81.3 kg)  09/30/21 174 lb 6.4 oz (79.1 kg)  09/01/21 172 lb 9.6 oz (78.3 kg)   BMI Readings from Last 3 Encounters:  12/02/21 30.76 kg/m  09/30/21 29.94 kg/m  09/01/21 29.63 kg/m    Assessment/Interventions: Review of patient past medical history, allergies, medications, health status, including review of  consultants reports, laboratory and other test data, was performed as part of comprehensive evaluation and provision of chronic care management services.   SDOH:  (Social Determinants of Health) assessments and interventions performed: Yes SDOH Interventions    Flowsheet Row Most Recent Value  SDOH Interventions   Financial Strain Interventions Other (Comment)  [Apply for [place] ([name] 10 mg)]      SDOH Screenings   Alcohol Screen: Low Risk    Last Alcohol Screening Score (AUDIT): 0  Depression (PHQ2-9): Low Risk    PHQ-2 Score: 0  Financial Resource  Strain: High Risk   Difficulty of Paying Living Expenses: Hard  Food Insecurity: No Food Insecurity   Worried About Charity fundraiser in the Last Year: Never true   Ran Out of Food in the Last Year: Never true  Housing: Low Risk    Last Housing Risk Score: 0  Physical Activity: Inactive   Days of Exercise per Week: 0 days   Minutes of Exercise per Session: 0 min  Social Connections: Not on file  Stress: No Stress Concern Present   Feeling of Stress : Only a little  Tobacco Use: Medium Risk   Smoking Tobacco Use: Former   Smokeless Tobacco Use: Former   Passive Exposure: Not on Pensions consultant Needs: No Data processing manager (Medical): No   Lack of Transportation (Non-Medical): No    CCM Care Plan  Allergies  Allergen Reactions   Codeine Nausea And Vomiting    Medications Reviewed Today     Reviewed by Debbora Dus, [facility] (Pharmacist) on 12/05/21 at [place] List Status: <None>   Medication Order Taking? Sig Documenting Provider Last Dose Status Informant  albuterol (VENTOLIN HFA) 108 (90 Base) MCG/ACT inhaler [id number] Yes Inhale 2 puffs into the lungs every 4 (four) hours as needed for shortness of breath or wheezing. Tyler Pita, MD Taking Active   allopurinol (ZYLOPRIM) 100 MG tablet [id number] Yes TAKE 1 TABLET(100 MG) BY MOUTH DAILY Bedsole, Amy E, MD Taking  Active   aspirin 81 MG chewable tablet [id number] Yes Chew 81 mg by mouth daily. [provider] Taking Active Spouse/Significant Other  atorvastatin (LIPITOR) 20 MG tablet [id number] Yes TAKE 1 TABLET BY MOUTH DAILY Bedsole, Amy E, MD Taking Active   Budeson-Glycopyrrol-Formoterol (BREZTRI AEROSPHERE) 160-9-4.8 MCG/ACT AERO [id number] Yes Inhale 2 puffs into the lungs in the morning and at bedtime. Tyler Pita, MD Taking Active   Cyanocobalamin (B-12 PO) [id number] Yes Take 1,000 mcg by mouth daily.  [provider] Taking Active Spouse/Significant Other  furosemide (LASIX) 40 MG tablet [id number] Yes Take 1.5 tablets (60 mg total) by mouth in the morning AND 1 tablet (40 mg total) every other day. In PM. Larey Dresser, MD Taking Active   [name] 10 MG TABS tablet [id number] Yes TAKE 1 TABLET(10 MG) BY MOUTH DAILY BEFORE BREAKFAST Larey Dresser, MD Taking Active   metoprolol succinate (TOPROL-XL) 25 MG 24 hr tablet [id number] Yes Take 25 mg by mouth daily. [provider] Taking Active   N-ACETYL CYSTEINE PO [id number] Yes Take by mouth. [provider] Taking Active Self  potassium chloride SA (KLOR-CON) 20 MEQ tablet [id number] Yes Take 1 tablet (20 mEq total) by mouth daily. Larey Dresser, MD Taking Active   predniSONE (DELTASONE) 10 MG tablet [id number] Yes TAKE 1 TABLET(10 MG) BY MOUTH DAILY WITH BREAKFAST Tyler Pita, MD Taking Active   pregabalin (LYRICA) 50 MG capsule [id number] Yes Take 1 capsule by mouth in the morning and 1 capsule by mouth at bedtime Bedsole, Amy E, MD Taking Active   spironolactone (ALDACTONE) 25 MG tablet [id number] Yes Take 1 tablet (25 mg total) by mouth at bedtime. Larey Dresser, MD Taking Active   Treprostinil (TYVASO) 0.6 MG/ML Bailey Mech [id number] Yes Inhale 18 mcg into the lungs 4 (four) times daily. [provider] Taking Active             Patient Active Problem List   Diagnosis Date Noted    Pulmonary HTN ([place]) 06/15/2021  Acute systolic heart failure (HCC)    Hypoxia 08/20/2020   CKD (chronic kidney disease), stage II    Acute respiratory failure with hypoxia (HCC)    Stage 3a chronic kidney disease (HCC)    Hypokalemia    Type 2 diabetes mellitus with hyperlipidemia (HCC)    GERD (gastroesophageal reflux disease) 10/17/2016   CKD stage 3 due to type 2 diabetes mellitus ([place]) 10/04/2016   Idiopathic pulmonary fibrosis ([place]) 02/25/2016   Thoracic aortic aneurysm without rupture 02/01/2016   Chronic cough 01/25/2016   Chronic obstructive pulmonary disease ([place]) 01/25/2016   Midline thoracic back pain 10/19/2015   Advanced directives, counseling/discussion 10/14/2015   History of atrial flutter 12/21/2014   Ventral hernia without obstruction or gangrene 96/22/2979   Chronic systolic CHF (congestive heart failure) ([place]) 12/23/2013   Intermediate coronary syndrome ([place]) 11/04/2013   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   DDD (degenerative disc disease), cervical 03/31/2013   Fuchs' corneal dystrophy 02/19/2013   Neuropathy due to secondary diabetes mellitus ([place]) 12/31/2012   Gout, chronic, 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) 06/12/2007   DIVERTICULOSIS, COLON 06/12/2007   BENIGN PROSTATIC HYPERTROPHY 06/12/2007    Immunization History  Administered Date(s) Administered   Influenza Split 07/31/2011, 08/08/2012, 09/01/2013   Influenza Whole 10/05/2004, 08/16/2007, 08/11/2008, 08/06/2009   Influenza, High Dose Seasonal PF 07/27/2016, 08/09/2017, 08/01/2018, 07/25/2019,  07/09/2020   Influenza,inj,Quad PF,6+ Mos 06/22/2014, 10/14/2015   Influenza-Unspecified 08/30/2018, 08/23/2021   Pneumococcal Conjugate-13 06/22/2014   Pneumococcal Polysaccharide-23 04/14/2009   Td 10/31/1997, 05/13/2010, 12/05/2019    Conditions to be addressed/monitored:  Hypertension, Hyperlipidemia, and Diabetes  Care Plan : [place]  Updates made by Debbora Dus, [place] since 12/05/2021 12:00 AM     Problem: CHL AMB "PATIENT-SPECIFIC PROBLEM"      Long-Range Goal: Disease Management   Start Date: 12/05/2021  Priority: High  Note:   Current Barriers:  Past due for PCP follow up diabetes, neuropathy (cancelled due to office relocation) Due for diabetic foot and eye exam  Pharmacist Clinical Goal(s):  Patient will contact provider office for questions/concerns as evidenced notation of same in electronic health record through collaboration with PharmD and provider.   Interventions: 1:1 collaboration with [name], MD regarding development and update of comprehensive plan of care as evidenced by provider attestation and co-signature Inter-disciplinary care team collaboration (see longitudinal plan of care) Comprehensive medication review performed; medication list updated in electronic medical record  Hypertension (BP goal <130/80) -Controlled, clinic readings within goal -Comorbidities: HFrEF, managed by cardiology  -Current treatment: Metoprolol succinate 25 mg - 1 tablet daily - Appropriate, Effective, Safe, Accessible Spironolactone 25 mg - 1 tablet daily - Appropriate, Effective, Safe, Accessible -Medications previously tried: none   -Current home readings: none reported -Denies hypotensive/hypertensive symptoms -Educated on BP goals and benefits of medications for prevention of heart attack, stroke and kidney damage; -Counseled to monitor BP at home with abnormal symptoms, document, and provide log at future appointments -Recommended to continue  current medication  Hyperlipidemia: (LDL goal < 70) -Controlled, LDL 48 -Current treatment: Atorvastatin 20 mg - 1 tablet daily - Appropriate, Effective, Safe, Accessible -Medications previously tried:  none -Educated on Cholesterol goals;  -Recommended to continue current medication  Diabetes (A1c goal <7%) -Controlled, last A1c 6.5%, due for repeat A1c, eye and foot exam; Needs help with [name] cost. -Current medications: [name] 10 mg - 1 tablet daily - Appropriate, Effective, Safe, NOT Accessible -Medications previously tried: none reported  -Current home glucose readings - does not home monitor BG -Denies hypoglycemic/hyperglycemic symptoms -Current meal patterns: limits sweets -Current exercise:None, Able to complete most ADLs without assistance. He does have a lot of SOB due to COPD, IPF. Spouse helps with showers. He is able to go to [facility] on _0   Patient's preferred pharmacy is:  [id number] [address], [place] [address] Phone: [phone] Fax: [phone]  Uses pill box? Yes Pt endorses 100% compliance, patient's wife puts medications in pillbox for him  Care Plan and Follow Up Patient Decision:  Patient agrees to Care Plan and Follow-up.  Debbora Dus, PharmD Clinical Pharmacist Practitioner [facility] at [facility] [phone]

## 2021-12-05 NOTE — Telephone Encounter (Signed)
Pt would like to apply for [name] 10 mg for 2023. Please update the 2022 forms and send to me for review. Thanks!

## 2021-12-05 NOTE — Patient Instructions (Signed)
Dear Caleb Bryant Sr.,  Below is a summary of the goals we discussed during our follow up appointment on December 05, 2021. Please contact me anytime with questions or concerns.   Visit Information  Patient Care Plan: CCM Pharmacy Care Plan     Problem Identified: CHL AMB "PATIENT-SPECIFIC PROBLEM"      Long-Range Goal: Disease Management   Start Date: 12/05/2021  Priority: High  Note:   Current Barriers:  Past due for PCP follow up diabetes, neuropathy (cancelled due to office relocation) Due for diabetic foot and eye exam  Pharmacist Clinical Goal(s):  Patient will contact provider office for questions/concerns as evidenced notation of same in electronic health record through collaboration with PharmD and provider.   Interventions: 1:1 collaboration with [name], MD regarding development and update of comprehensive plan of care as evidenced by provider attestation and co-signature Inter-disciplinary care team collaboration (see longitudinal plan of care) Comprehensive medication review performed; medication list updated in electronic medical record  Hypertension (BP goal <130/80) -Controlled, clinic readings within goal -Comorbidities: HFrEF, managed by cardiology  -Current treatment: Metoprolol succinate 25 mg - 1 tablet daily - Appropriate, Effective, Safe, Accessible Spironolactone 25 mg - 1 tablet daily - Appropriate, Effective, Safe, Accessible -Medications previously tried: none   -Current home readings: none reported -Denies hypotensive/hypertensive symptoms -Educated on BP goals and benefits of medications for prevention of heart attack, stroke and kidney damage; -Counseled to monitor BP at home with abnormal symptoms, document, and provide log at future appointments -Recommended to continue current medication  Hyperlipidemia: (LDL goal < 70) -Controlled, LDL 48 -Current treatment: Atorvastatin 20 mg - 1 tablet daily - Appropriate, Effective, Safe,  Accessible -Medications previously tried: none -Educated on Cholesterol goals;  -Recommended to continue current medication  Diabetes (A1c goal <7%) -Controlled, last A1c 6.5%, due for repeat A1c, eye and foot exam; Needs help with [name] cost. -Current medications: [name] 10 mg - 1 tablet daily - Appropriate, Effective, Safe, NOT Accessible -Medications previously tried: none reported  -Current home glucose readings - does not home monitor BG -Denies hypoglycemic/hyperglycemic symptoms -Current meal patterns: limits sweets -Current exercise:None, Able to complete most ADLs without assistance. He does have a lot of SOB due to COPD, IPF. Spouse helps with showers. He is able to go to [place] on Sundays and breakfast with neighbor once weekly, otherwise stays home.  -Educated on A1c and blood sugar goals; -Counseled to check feet daily and get yearly eye exams - DUE -Recommended to continue current medication; CCM team will start [name] PAP forms for 2023 for patient to sign at upcoming PCP visit. Pt is due for eye and foot exam - please schedule.  Patient Goals/Self-Care Activities Patient will:  - continue to take medications as prescribed as evidenced by patient report and record review - schedule follow up with PCP - schedule annual diabetic eye exam  Follow Up Plan: Telephone follow up appointment with care management team member scheduled for: - CCM PharmD follow up 6 months  - [name] to complete PAP forms      Patient verbalizes understanding of instructions and care plan provided today and agrees to view in [place]. Active MyChart status confirmed with patient.    Debbora Dus, PharmD Clinical Pharmacist Practitioner [facility] at [facility] [phone]

## 2021-12-06 NOTE — Progress Notes (Signed)
PCP: [name], MD Cardiology: Dr. Fletcher Anon HF Cardiology: Dr. Aundra Dubin  88 y.o. with history of atrial flutter, pulmonary fibrosis, and chronic systolic CHF was referred by Dr. Fletcher Anon for evaluation of CHF and pulmonary hypertension.  Patient has had pulmonary fibrosis since 2013, thought to be related to working in a [employer], had exposure to asbestos.  Last CT chest in 12/21 showed progressive fibrosis and honeycombing.  In 1/15, patient was noted to be in atrial flutter.  Echo at the time showed EF 25-30% and cath showed minimal CAD.  He had atria fibrillation ablation in 2/15 and anticoagulation was later stopped.  Echo in 8/16 showed EF back to normal range.  In 10/21, he had COVID-19 PNA.  He feels like he has never recovered from this.  Prior to 10/21, he was short of breath after weed-eating for about 15 minutes.  After 10/21, progressive dyspnea and required home oxygen.    Echo was done in 2/22 due to worsening symptoms.  This showed EF 25-30% with moderate RV dysfunction.  RHC/LHC was then done in 2/22, showing mild luminal irregularities, elevated right heart filling pressure and severe pulmonary arterial hypertension with normal PCWP.  Cardiac MRI in 3/22 showed EF 28%, diffuse hypokinesis, severe RV dilation with akinetic mid to apical RV wall and RV EF 30%, D-shaped septum, nonspecific RV insertion site LGE.  V/Q scan in 4/22 showed no chronic PE.  High resolution CT chest in 9/22 showed interstitial lung disease consistent with UIP as well as moderate emphysema.  CTA chest in 9/22 showed no PE.  Zio monitor in 9/22 showed PACs, short SVT runs, no atrial fibrillation.   Follow up 12/22.  He felt like Tyvaso has helped his breathing, did not want to switch to DPI form. Remained SOB walking around his house, uses 4L home oxygen and oxygen saturation will drop with this still.  Spiro increased.  Today he returns for HF follow up with his wife. Feels breathing is getting worse. He feels  Tyvaso is not as effective as it once was (he is up to 9 puffs now). He remains SOB with minimal activity. He is wearing 6L during the day and 4L at night. He has occasional chest pain and palpitations when he exerts himself. Denies abnormal bleeding, dizziness, edema, or PND/Orthopnea. Appetite ok. No fever or chills. Weight at home 173 pounds. Taking all medications.   ReDs: 44%  Labs (2/22): K 4, creatinine 1.23 => 1.18, hgb 14.2, BNP 1273 Labs (4/22): LDL 48, K 4.3, creatinine 1.48 Labs (9/22): K 3.7,creatinine 1.7 => 1.51, BNP 775 => 247 Labs (12/22): K 4.3, creatinine 1.49  ECG (personally reviewed): SR, RBBB  6 minute walk (12/22): 73 m (only walked 2.5 min)  PMH: 1. Atrial flutter: S/p ablation 2/15.  2. Type 2 diabetes 3. HTN 4. Hyperlipidemia 5. Ascending aortic aneurysm: Small, 41 mm on 2/22 echo.  6. Pulmonary fibrosis: Exposure to environmental toxins including asbestos while working [employer].   Fibrosis was found in 2013.  - CT chest (12/21): Progressive ILD in UIP pattern with honeycombing.  - PFTs (2/22): FVC 66%, FEV1 77%, ratio 80%, TLC 47% => restrictive pattern.  - High resolution CT chest (9/22): interstitial lung disease consistent with UIP as well as moderate emphysema. 7. COVID-19 infection 10/21.  8. COPD: Prior smoker.  - High resolution CT chest in 9/22 showed interstitial lung disease consistent with UIP as well as moderate emphysema.  9. Chronic systolic CHF: Echo in 7/67  with EF 25-30%, thought to be tachy-mediated CMP due to atrial flutter.   - Echo (8/16) with EF normal range.  - Echo (2/22) with EF 25-30%, global hypokinesis, moderately decreased RV systolic function (no atrial arrhythmia).  - RHC/LHC (2/22): Mild nonobstructive CAD; mean RA 17, PA 70/27 mean 44, mean PCWP 13, CI 2.13, PVR 8.12.  - Cardiac MRI (3/22): EF 28%, diffuse hypokinesis, severe RV dilation with akinetic mid to apical RV wall and RV EF 30%, D-shaped septum, nonspecific RV  insertion site LGE. 10. Pulmonary hypertension: RHC (2/22) with mean RA 17, PA 70/27 mean 44, mean PCWP 13, CI 2.13, PVR 8.12. - V/Q scan: No evidence for chronic PE.  11. OSA  SH: Retired Civil engineer, contracting, married, lives in [place].  Enjoys fishing.  Remote smoker, no ETOH.   Family History  Problem Relation Age of Onset   Coronary artery disease Sister    Heart failure Mother    ROS: All systems reviewed and negative except as per HPI.   Current Outpatient Medications  Medication Sig Dispense Refill   albuterol (VENTOLIN HFA) 108 (90 Base) MCG/ACT inhaler Inhale 2 puffs into the lungs every 4 (four) hours as needed for shortness of breath or wheezing. 18 g 0   allopurinol (ZYLOPRIM) 100 MG tablet TAKE 1 TABLET(100 MG) BY MOUTH DAILY 90 tablet 3   aspirin 81 MG chewable tablet Chew 81 mg by mouth daily.     atorvastatin (LIPITOR) 20 MG tablet TAKE 1 TABLET BY MOUTH DAILY 90 tablet 3   Budeson-Glycopyrrol-Formoterol (BREZTRI AEROSPHERE) 160-9-4.8 MCG/ACT AERO Inhale 2 puffs into the lungs in the morning and at bedtime. 5.9 g 0   Cyanocobalamin (B-12 PO) Take 1,000 mcg by mouth daily.      furosemide (LASIX) 40 MG tablet Take 1.5 tablets (60 mg total) by mouth in the morning AND 1 tablet (40 mg total) every other day. In PM. 60 tablet 11   [name] 10 MG TABS tablet TAKE 1 TABLET(10 MG) BY MOUTH DAILY BEFORE BREAKFAST 30 tablet 11   metoprolol succinate (TOPROL-XL) 25 MG 24 hr tablet Take 25 mg by mouth daily.     N-ACETYL CYSTEINE PO Take by mouth.     potassium chloride SA (KLOR-CON) 20 MEQ tablet Take 1 tablet (20 mEq total) by mouth daily. 90 tablet 3   predniSONE (DELTASONE) 10 MG tablet TAKE 1 TABLET(10 MG) BY MOUTH DAILY WITH BREAKFAST 30 tablet 2   pregabalin (LYRICA) 50 MG capsule Take 1 capsule by mouth in the morning and 1 capsule by mouth at bedtime 180 capsule 0   spironolactone (ALDACTONE) 25 MG tablet Take 1 tablet (25 mg total) by mouth at bedtime. 30  tablet 11   Treprostinil (TYVASO) 0.6 MG/ML SOLN Inhale 18 mcg into the lungs 4 (four) times daily.     No current facility-administered medications for this encounter.   Wt Readings from Last 3 Encounters:  12/09/21 80.2 kg (176 lb 12.8 oz)  12/02/21 81.3 kg (179 lb 3.2 oz)  09/30/21 79.1 kg (174 lb 6.4 oz)   BP 98/62    Pulse 85    Ht _0  (1.626 m)    Wt 80.2 kg (176 lb 12.8 oz)    SpO2 93% Comment: on 3L   BMI 30.35 kg/m   General:  NAD. No resp difficulty, arrived in [facility] on oxygen. HEENT: Normal Neck: Supple. JVP 7-8. Carotids 2+ bilat; no bruits. No lymphadenopathy or thryomegaly appreciated. Cor: PMI nondisplaced. Regular  rate & rhythm. No rubs, gallops or murmurs. Lungs: coarse bibasilar crackles Abdomen: Soft, nontender, +distended. No hepatosplenomegaly. No bruits or masses. Good bowel sounds. Extremities: No cyanosis, clubbing, rash, edema Neuro: Alert & oriented x 3, cranial nerves grossly intact. Moves all 4 extremities w/o difficulty. Affect pleasant.  Assessment/Plan: 1. Chronic systolic CHF: Nonischemic cardiomyopathy, cath in 2/22 with nonobstructive CAD.  In past, he was thought to have tachy-mediated CMP.  However, he has not had any recent prolonged fibrillation or flutter, so this does not seem to be the culprit for the recurrent drop in EF. Echo in 2/22 showed biventricular failure with LV EF 25-30% and moderate RV dysfunction (RVH on echo).  RHC in 2/22 showed elevated right-sided filling pressure and pulmonary arterial hypertension.  Cardiac MRI in 3/22 showed EF 28%, diffuse hypokinesis, severe RV dilation with akinetic mid to apical RV wall and RV EF 30%, D-shaped septum, nonspecific RV insertion site LGE.  RV failure most likely is cor pulmonale with pulmonary fibrosis and pulmonary hypertension, but regional wall motion abnormalities (apex and mid-wall RV akinesis) brings up concern for ARVC (though no evidence for fibrofatty RV infiltration on MRI).  Today, NYHA  class IIIb symptoms, he is at least mildly volume overloaded on exam, ReDs 44%,weight up 2 lbs.  I suspect that the main issue here causing dyspnea is pulmonary fibrosis and emphysema.   - No BP room to start valsartan today (BP did not tolerate Entresto). - Increase Lasix to 60 mg bid x 2 days, then 60 mg q am/40 mg q pm. BMET/BNP today, repeat BMET in 10 days. - Continue Toprol XL 25 mg daily, take at night given soft BP.   - Continue [name] 10 mg daily.  - Continue spironolactone 25 mg q hs. - Given age, would not pursue ICD.  Narrow QRS so not CRT candidate.  2. 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 PVR 8.1 on 2/22 cath. V/Q scan not suggestive of chronic PE. Possible group 1 PH component.  PH is contributing to RV systolic dysfunction.  He feels like Tyvaso has helped his breathing, but still very symptomatic.  - Continue Tyvaso, he does not want to switch to DPI.     - Continue use CPAP.  3. Pulmonary fibrosis: Restrictive PFTs in 2/22.  CT chest in 12/21 with progressive pulmonary fibrosis and honeycombing.  COVID-19 lung involvement could have worsened pulmonary parenchymal disease.  High resolution CT chest in 9/22 showed interstitial lung disease consistent with UIP as well as moderate emphysema.   - He follows with pulmonary, would he be a candidate for antifibrotic therapy?  - Continue home oxygen.  4. Atrial flutter: Noted in 1/15, had ablation in 2/15, no atrial fibrillation documented since that time.  He is in NSR today.  He is not anticoagulated.  5. COPD: Emphysema on chest CT. Breathing considerably better with prednisone. He wants to continue long-term.  He needs to followup with pulmonary.   Followup in 3-4 weeks with APP (reassess volume, ReDs, +/- valsartan) and in 2-3 months with Dr. Wynema Birch [facility] FNP 12/09/2021

## 2021-12-09 ENCOUNTER — Other Ambulatory Visit: Payer: Self-pay

## 2021-12-09 ENCOUNTER — Ambulatory Visit (HOSPITAL_COMMUNITY)
Admission: RE | Admit: 2021-12-09 | Discharge: 2021-12-09 | Disposition: A | Discharge status: Home / Self Care | Payer: PPO | Source: Ambulatory Visit | Attending: Family Medicine | Admitting: Family Medicine

## 2021-12-09 ENCOUNTER — Encounter (HOSPITAL_COMMUNITY): Payer: Self-pay

## 2021-12-09 VITALS — BP 98/62 | HR 85 | Ht 64.0 in | Wt 176.8 lb

## 2021-12-09 DIAGNOSIS — Z7901 Long term (current) use of anticoagulants: Secondary | ICD-10-CM | POA: Diagnosis not present

## 2021-12-09 DIAGNOSIS — I2723 Pulmonary hypertension due to lung diseases and hypoxia: Secondary | ICD-10-CM | POA: Diagnosis not present

## 2021-12-09 DIAGNOSIS — J8410 Pulmonary fibrosis, unspecified: Secondary | ICD-10-CM | POA: Diagnosis not present

## 2021-12-09 DIAGNOSIS — J439 Emphysema, unspecified: Secondary | ICD-10-CM | POA: Diagnosis not present

## 2021-12-09 DIAGNOSIS — I428 Other cardiomyopathies: Secondary | ICD-10-CM | POA: Insufficient documentation

## 2021-12-09 DIAGNOSIS — Z7951 Long term (current) use of inhaled steroids: Secondary | ICD-10-CM | POA: Diagnosis not present

## 2021-12-09 DIAGNOSIS — I2510 Atherosclerotic heart disease of native coronary artery without angina pectoris: Secondary | ICD-10-CM | POA: Diagnosis not present

## 2021-12-09 DIAGNOSIS — I272 Pulmonary hypertension, unspecified: Secondary | ICD-10-CM | POA: Diagnosis not present

## 2021-12-09 DIAGNOSIS — Z8616 Personal history of COVID-19: Secondary | ICD-10-CM | POA: Insufficient documentation

## 2021-12-09 DIAGNOSIS — I5022 Chronic systolic (congestive) heart failure: Secondary | ICD-10-CM | POA: Diagnosis not present

## 2021-12-09 DIAGNOSIS — I4892 Unspecified atrial flutter: Secondary | ICD-10-CM | POA: Diagnosis not present

## 2021-12-09 DIAGNOSIS — G4733 Obstructive sleep apnea (adult) (pediatric): Secondary | ICD-10-CM | POA: Diagnosis not present

## 2021-12-09 DIAGNOSIS — Z8679 Personal history of other diseases of the circulatory system: Secondary | ICD-10-CM

## 2021-12-09 DIAGNOSIS — Z79899 Other long term (current) drug therapy: Secondary | ICD-10-CM | POA: Diagnosis not present

## 2021-12-09 DIAGNOSIS — Z7984 Long term (current) use of oral hypoglycemic drugs: Secondary | ICD-10-CM | POA: Diagnosis not present

## 2021-12-09 DIAGNOSIS — J449 Chronic obstructive pulmonary disease, unspecified: Secondary | ICD-10-CM | POA: Diagnosis not present

## 2021-12-09 LAB — BASIC METABOLIC PANEL
Anion gap: 12 (ref 5–15)
BUN: 29 mg/dL — ABNORMAL HIGH (ref 8–23)
CO2: 30 mmol/L (ref 22–32)
Calcium: 9.6 mg/dL (ref 8.9–10.3)
Chloride: 97 mmol/L — ABNORMAL LOW (ref 98–111)
Creatinine, Ser: 1.59 mg/dL — ABNORMAL HIGH (ref 0.61–1.24)
GFR, Estimated: 42 mL/min — ABNORMAL LOW (ref >60)
Glucose, Bld: 140 mg/dL — ABNORMAL HIGH (ref 70–99)
Potassium: 4.3 mmol/L (ref 3.5–5.1)
Sodium: 139 mmol/L (ref 135–145)

## 2021-12-09 LAB — BRAIN NATRIURETIC PEPTIDE: B Natriuretic Peptide: 316 pg/mL — ABNORMAL HIGH (ref 0.0–100.0)

## 2021-12-09 MED ORDER — FUROSEMIDE 40 MG PO TABS: ORAL_TABLET | ORAL | 11 refills | Status: DC

## 2021-12-09 NOTE — Patient Instructions (Addendum)
INCREASE Lasix to 29m (1.5 tabs) twice a day for 2 days THEN START Lasix 613m(1.5 tabs) in the morning AND 4094m1 tab) every evening after that  Labs today We will only contact you if something comes back abnormal or we need to make some changes. Otherwise no news is good news!  Your physician recommends that you schedule a follow-up appointment in: 3-4 weeks with the Nurse Practitioner/Physician Assistant and 2-3 months with Dr MclAundra DubinPlease call office at [phone] 2 if you have any questions or concerns.   At the [facility] and your health needs are our priority. As part of our continuing mission to provide you with exceptional heart care, we have created designated Provider Care Teams. These Care Teams include your primary Cardiologist (physician) and Advanced Practice Providers (APPs- Physician Assistants and Nurse Practitioners) who all work together to provide you with the care you need, when you need it.   You may see any of the following providers on your designated Care Team at your next follow up: Dr DanGlori Bickers DalHaynes KernsP BriLyda JesterA [facility] U[facility]huAudry RilesharmD   Please be sure to bring in all your medications bottles to every appointment.

## 2021-12-09 NOTE — Progress Notes (Signed)
ReDS Vest / Clip - 12/09/21 1100       ReDS Vest / Clip   Station Marker B    Ruler Value 36    ReDS Value Range High volume overload    ReDS Actual Value 44

## 2021-12-10 ENCOUNTER — Other Ambulatory Visit: Payer: Self-pay | Admitting: Cardiovascular Disease

## 2021-12-12 ENCOUNTER — Other Ambulatory Visit (HOSPITAL_COMMUNITY): Payer: Self-pay

## 2021-12-12 NOTE — Telephone Encounter (Signed)
Advanced Heart Failure Patient Advocate Encounter  [employer] would like the patient to apply for and be denied for LIS in order to continue processing his application. Will keep that in mind if we need to circle back to assistance. The PAN HF fund is currently open. The patient was approved for a [employer] HF grant that will help pay for [name].  Member ID: [id number] Group ID: [id number] RxBin ID: [id number] PCN: [id number] Eligibility Start Date: 09/13/2021 Eligibility End Date: 12/11/2022 Assistance Amount: $1,200.00

## 2021-12-14 ENCOUNTER — Other Ambulatory Visit: Payer: Self-pay | Admitting: Family Medicine

## 2021-12-14 NOTE — Telephone Encounter (Signed)
Printed [employer] forms. Placed in front office "patient documents" file for patient to pick up at his appt 12/20/21. Noted on forms to return to me once complete.

## 2021-12-14 NOTE — Telephone Encounter (Signed)
Refill request Lyrica Last refill 09/06/21 #180 Last office visit 03/04/21 Upcoming appointment 12/20/21

## 2021-12-15 DIAGNOSIS — G4730 Sleep apnea, unspecified: Secondary | ICD-10-CM | POA: Diagnosis not present

## 2021-12-15 DIAGNOSIS — R0683 Snoring: Secondary | ICD-10-CM | POA: Diagnosis not present

## 2021-12-20 ENCOUNTER — Encounter: Payer: Self-pay | Admitting: Family Medicine

## 2021-12-20 ENCOUNTER — Ambulatory Visit (INDEPENDENT_AMBULATORY_CARE_PROVIDER_SITE_OTHER): Payer: PPO | Admitting: Family Medicine

## 2021-12-20 ENCOUNTER — Other Ambulatory Visit: Payer: Self-pay

## 2021-12-20 VITALS — BP 138/80 | HR 73 | Temp 98.1°F | Ht 64.0 in | Wt 169.0 lb

## 2021-12-20 DIAGNOSIS — I5022 Chronic systolic (congestive) heart failure: Secondary | ICD-10-CM | POA: Diagnosis not present

## 2021-12-20 DIAGNOSIS — J449 Chronic obstructive pulmonary disease, unspecified: Secondary | ICD-10-CM | POA: Diagnosis not present

## 2021-12-20 DIAGNOSIS — E1159 Type 2 diabetes mellitus with other circulatory complications: Secondary | ICD-10-CM | POA: Diagnosis not present

## 2021-12-20 DIAGNOSIS — E785 Hyperlipidemia, unspecified: Secondary | ICD-10-CM | POA: Diagnosis not present

## 2021-12-20 DIAGNOSIS — K219 Gastro-esophageal reflux disease without esophagitis: Secondary | ICD-10-CM | POA: Diagnosis not present

## 2021-12-20 DIAGNOSIS — E1169 Type 2 diabetes mellitus with other specified complication: Secondary | ICD-10-CM | POA: Diagnosis not present

## 2021-12-20 DIAGNOSIS — R251 Tremor, unspecified: Secondary | ICD-10-CM

## 2021-12-20 DIAGNOSIS — I10 Essential (primary) hypertension: Secondary | ICD-10-CM

## 2021-12-20 DIAGNOSIS — E1151 Type 2 diabetes mellitus with diabetic peripheral angiopathy without gangrene: Secondary | ICD-10-CM

## 2021-12-20 LAB — POCT GLYCOSYLATED HEMOGLOBIN (HGB A1C): Hemoglobin A1C: 6.9 % — AB (ref 4.0–5.6)

## 2021-12-20 NOTE — Patient Instructions (Addendum)
Can try a trial off of Lyrica.Marland Kitchen to see if causing contributing to the tremor and if true benefit with foot pain.  Can try Pepcid AC for upper abdominal pain/bloating after meals.Marland Kitchen avoid acidic foods.

## 2021-12-20 NOTE — Progress Notes (Signed)
Patient ID: Caleb Daversa Sr., male    DOB: Oct 22, 1934, 88 y.o.   MRN: [id number]  This visit was conducted in person.     CC: Chief Complaint  Patient presents with   6 Month Follow-up    Here with wife.    Subjective:   HPI: Caleb Ulibarri Sr. is a 88 y.o. male presenting on 12/20/2021 for 6 Month Follow-up (Here with wife.)  Hypertension:   At goal on current regimen. BP Readings from Last 3 Encounters:  12/20/21 138/80  12/09/21 98/62  12/02/21 128/80  Using medication without problems or lightheadedness:  none Chest pain with exertion:none Edema:none Short of breath:none Average home BPs: Other issues: systolic heart failure  Diabetes:  Well controlled for age on jardiance 10 mg daily. Lab Results  Component Value Date   HGBA1C 6.9 (A) 12/20/2021  Using medications without difficulties: Hypoglycemic episodes: Hyperglycemic episodes: Feet problems: none Blood Sugars averaging: eye exam within last year:    Wt Readings from Last 3 Encounters:  12/20/21 169 lb (76.7 kg)  12/09/21 176 lb 12.8 oz (80.2 kg)  12/02/21 179 lb 3.2 oz (81.3 kg)      He has tremor now ... now on prednisone for COPD.  On oxygen for COPD and systolic heart failure  Relevant past medical, surgical, family and social history reviewed and updated as indicated. Interim medical history since our last visit reviewed. Allergies and medications reviewed and updated. Outpatient Medications Prior to Visit  Medication Sig Dispense Refill   albuterol (VENTOLIN HFA) 108 (90 Base) MCG/ACT inhaler Inhale 2 puffs into the lungs every 4 (four) hours as needed for shortness of breath or wheezing. 18 g 0   allopurinol (ZYLOPRIM) 100 MG tablet TAKE 1 TABLET(100 MG) BY MOUTH DAILY 90 tablet 3   aspirin 81 MG chewable tablet Chew 81 mg by mouth daily.     atorvastatin (LIPITOR) 20 MG tablet TAKE 1 TABLET BY MOUTH DAILY 90 tablet 3   Budeson-Glycopyrrol-Formoterol (BREZTRI AEROSPHERE)  160-9-4.8 MCG/ACT AERO Inhale 2 puffs into the lungs in the morning and at bedtime. 5.9 g 0   Cyanocobalamin (B-12 PO) Take 1,000 mcg by mouth daily.      furosemide (LASIX) 40 MG tablet Take 1.5 tablets (60 mg total) by mouth in the morning AND 1 tablet (40 mg total) every evening. 90 tablet 11   [name] 10 MG TABS tablet TAKE 1 TABLET(10 MG) BY MOUTH DAILY BEFORE BREAKFAST 30 tablet 11   metoprolol succinate (TOPROL-XL) 25 MG 24 hr tablet TAKE 1 TABLET(25 MG) BY MOUTH DAILY 30 tablet 0   potassium chloride SA (KLOR-CON) 20 MEQ tablet Take 1 tablet (20 mEq total) by mouth daily. 90 tablet 3   predniSONE (DELTASONE) 10 MG tablet TAKE 1 TABLET(10 MG) BY MOUTH DAILY WITH BREAKFAST 30 tablet 2   pregabalin (LYRICA) 50 MG capsule TAKE 1 CAPSULE BY MOUTH IN THE MORNING AND AT BEDTIME 180 capsule 0   spironolactone (ALDACTONE) 25 MG tablet Take 1 tablet (25 mg total) by mouth at bedtime. 30 tablet 11   Treprostinil (TYVASO) 0.6 MG/ML SOLN Inhale 18 mcg into the lungs 4 (four) times daily.     N-ACETYL CYSTEINE PO Take by mouth.     No facility-administered medications prior to visit.     Per HPI unless specifically indicated in ROS section below Review of Systems  Constitutional:  Negative for fatigue and fever.  HENT:  Negative for ear pain.   Eyes:  Negative  for pain.  Respiratory:  Positive for shortness of breath. Negative for cough.   Cardiovascular:  Negative for chest pain, palpitations and leg swelling.  Gastrointestinal:  Positive for abdominal distention, abdominal pain and nausea.  Genitourinary:  Negative for dysuria.  Musculoskeletal:  Negative for arthralgias.  Neurological:  Negative for syncope, light-headedness and headaches.  Psychiatric/Behavioral:  Negative for dysphoric mood.   Objective:  There were no vitals taken for this visit.  Wt Readings from Last 3 Encounters:  12/09/21 176 lb 12.8 oz (80.2 kg)  12/02/21 179 lb 3.2 oz (81.3 kg)  09/30/21 174 lb 6.4 oz (79.1  kg)      Physical Exam    Results for orders placed or performed during the hospital encounter of 63/87/56  Basic metabolic panel  Result Value Ref Range   Sodium 139 135 - 145 mmol/L   Potassium 4.3 3.5 - 5.1 mmol/L   Chloride 97 (L) 98 - 111 mmol/L   CO2 30 22 - 32 mmol/L   Glucose, Bld 140 (H) 70 - 99 mg/dL   BUN 29 (H) 8 - 23 mg/dL   Creatinine, Ser 1.59 (H) 0.61 - 1.24 mg/dL   Calcium 9.6 8.9 - 10.3 mg/dL   GFR, Estimated 42 (L) >60 mL/min   Anion gap 12 5 - 15  Brain natriuretic peptide  Result Value Ref Range   B Natriuretic Peptide 316.0 (H) 0.0 - 100.0 pg/mL    This visit occurred during the SARS-CoV-2 public health emergency.  Safety protocols were in place, including screening questions prior to the visit, additional usage of staff PPE, and extensive cleaning of exam room while observing appropriate contact time as indicated for disinfecting solutions.   COVID 19 screen:  No recent travel or known exposure to COVID19 The patient denies respiratory symptoms of COVID 19 at this time. The importance of social distancing was discussed today.   Assessment and Plan    Problem List Items Addressed This Visit     Chronic obstructive pulmonary disease ([place])     On continuous oxygen      Chronic systolic CHF (congestive heart failure) ([place])     Closer to euvolemic in office today.      DM (diabetes mellitus) with peripheral vascular complication (HCC) - Primary    Stable, chronic.  Continue current medication.    jardiance 10 mg daily      Relevant Orders   HgB A1c (Completed)   GERD (gastroesophageal reflux disease)    Acute on chronic   Can try Pepcid AC for upper abdominal pain/bloating after meals.Marland Kitchen avoid acidic foods.      Hyperlipidemia associated with type 2 diabetes mellitus (HCC)    Stable, chronic.  Continue current medication.   Atorvastatin 20 mg daily      Hypertension associated with diabetes (HCC)    Stable, chronic.  Continue current  medication.   Toprol XL 25 mg daily  spironolactone 25 mg daily Lasix 60 mg in AM and 40 mg in PM      Tremor    Acute, worsened  Can try a trial off of Lyrica.Marland Kitchen to see if causing contributing to the tremor and if true benefit with foot pain.        Eliezer Lofts, MD

## 2021-12-21 DIAGNOSIS — R0902 Hypoxemia: Secondary | ICD-10-CM | POA: Diagnosis not present

## 2021-12-21 DIAGNOSIS — J1282 Pneumonia due to coronavirus disease 2019: Secondary | ICD-10-CM | POA: Diagnosis not present

## 2021-12-26 ENCOUNTER — Other Ambulatory Visit (HOSPITAL_COMMUNITY): Payer: Self-pay | Admitting: *Deleted

## 2021-12-26 MED ORDER — EMPAGLIFLOZIN 10 MG PO TABS: ORAL_TABLET | ORAL | 3 refills | Status: AC

## 2021-12-26 NOTE — Telephone Encounter (Signed)
Patient's wife called back, provided information below.  Charlann Boxer, CPhT

## 2021-12-26 NOTE — Telephone Encounter (Signed)
Advanced Heart Failure Patient Advocate Encounter  Sent 90 day RX request to [facility] ([place] to send to [facility] with the attached grant information. Called and left the patient a detailed message regarding grant and where to get RX from.  Charlann Boxer, CPhT

## 2021-12-27 DIAGNOSIS — E7800 Pure hypercholesterolemia, unspecified: Secondary | ICD-10-CM | POA: Diagnosis not present

## 2021-12-27 DIAGNOSIS — E1151 Type 2 diabetes mellitus with diabetic peripheral angiopathy without gangrene: Secondary | ICD-10-CM | POA: Diagnosis not present

## 2021-12-27 DIAGNOSIS — I10 Essential (primary) hypertension: Secondary | ICD-10-CM | POA: Diagnosis not present

## 2021-12-29 ENCOUNTER — Telehealth: Payer: Self-pay

## 2021-12-29 DIAGNOSIS — J449 Chronic obstructive pulmonary disease, unspecified: Secondary | ICD-10-CM

## 2021-12-29 NOTE — Telephone Encounter (Signed)
Called and LVM in regards to patients ONO results. Per Dr Patsey Berthold pt should increase o2 to 3L while sleeping. Will route to triage to initiate follow up. ?

## 2021-12-30 ENCOUNTER — Encounter (HOSPITAL_COMMUNITY): Payer: PPO

## 2021-12-30 NOTE — Telephone Encounter (Signed)
Yes continue using 4 L then.  Lets repeat ONOX on 4 L. ?

## 2021-12-30 NOTE — Telephone Encounter (Signed)
Caleb Bryant(DPR) is aware of below message/recommendations and voiced her understanding. ?ONO ordered. ?Nothing further needed.  ? ? ?

## 2021-12-30 NOTE — Telephone Encounter (Signed)
[name] wife is returning phone call. [name] phone number is [phone] h or [phone] c. ?

## 2021-12-30 NOTE — Telephone Encounter (Signed)
Spoke to patient's spouse, [name] and relayed below results. She voiced her understanding.  ?She stated that patient is currently wearing 4L QHS, however during the test he wore 2L and woke up at 4:00 to go to the restroom and passed out.  ?She is concerned that he needs to continue at 4L. ? ?[name], please advise. thanks ?

## 2021-12-30 NOTE — Telephone Encounter (Signed)
Lm for patient.  

## 2022-01-04 NOTE — Progress Notes (Addendum)
PCP: [name], MD Cardiology: Dr. Fletcher Anon HF Cardiology: Dr. Aundra Dubin  88 y.o. with history of atrial flutter, pulmonary fibrosis, and chronic systolic CHF was referred by Dr. Fletcher Anon for evaluation of CHF and pulmonary hypertension.  Patient has had pulmonary fibrosis since 2013, thought to be related to working in a [employer], had exposure to asbestos.  Last CT chest in 12/21 showed progressive fibrosis and honeycombing.  In 1/15, patient was noted to be in atrial flutter.  Echo at the time showed EF 25-30% and cath showed minimal CAD.  He had atria fibrillation ablation in 2/15 and anticoagulation was later stopped.  Echo in 8/16 showed EF back to normal range.  In 10/21, he had COVID-19 PNA.  He feels like he has never recovered from this.  Prior to 10/21, he was short of breath after weed-eating for about 15 minutes.  After 10/21, progressive dyspnea and required home oxygen.    Echo was done in 2/22 due to worsening symptoms.  This showed EF 25-30% with moderate RV dysfunction.  RHC/LHC was then done in 2/22, showing mild luminal irregularities, elevated right heart filling pressure and severe pulmonary arterial hypertension with normal PCWP.  Cardiac MRI in 3/22 showed EF 28%, diffuse hypokinesis, severe RV dilation with akinetic mid to apical RV wall and RV EF 30%, D-shaped septum, nonspecific RV insertion site LGE.  V/Q scan in 4/22 showed no chronic PE.  High resolution CT chest in 9/22 showed interstitial lung disease consistent with UIP as well as moderate emphysema.  CTA chest in 9/22 showed no PE.  Zio monitor in 9/22 showed PACs, short SVT runs, no atrial fibrillation.   Follow up 12/22.  He felt like Tyvaso has helped his breathing, did not want to switch to DPI form. Remained SOB walking around his house, uses 4L home oxygen and oxygen saturation will drop with this still.  Spiro increased.  Today he returns for HF follow up with his wife. Overall feeling fine. Remains on 4L oxygen  continuously. He is SOB when he exerts himself. Feels abdomen is distended. Denies abnormal bleeding, palpitations, CP, dizziness, edema, or PND/Orthopnea. Appetite ok. No fever or chills. Weight at home 173 pounds. Taking all medications. Does not feel Tyvaso is as effective as it once was (up to 9 puffs now).  ReDs: 41%  Labs (2/22): K 4, creatinine 1.23 => 1.18, hgb 14.2, BNP 1273 Labs (4/22): LDL 48, K 4.3, creatinine 1.48 Labs (9/22): K 3.7,creatinine 1.7 => 1.51, BNP 775 => 247 Labs (12/22): K 4.3, creatinine 1.49 Labs (2/23): K 4.3, creatinine 1.59  ECG (personally reviewed): none ordered today.  6 minute walk (12/22): 73 m (only walked 2.5 min) 6 minute walk (3/23): defer to next visit when not volume overloaded.  PMH: 1. Atrial flutter: S/p ablation 2/15.  2. Type 2 diabetes 3. HTN 4. Hyperlipidemia 5. Ascending aortic aneurysm: Small, 41 mm on 2/22 echo.  6. Pulmonary fibrosis: Exposure to environmental toxins including asbestos while working [employer].   Fibrosis was found in 2013.  - CT chest (12/21): Progressive ILD in UIP pattern with honeycombing.  - PFTs (2/22): FVC 66%, FEV1 77%, ratio 80%, TLC 47% => restrictive pattern.  - High resolution CT chest (9/22): interstitial lung disease consistent with UIP as well as moderate emphysema. 7. COVID-19 infection 10/21.  8. COPD: Prior smoker.  - High resolution CT chest in 9/22 showed interstitial lung disease consistent with UIP as well as moderate emphysema.  9. 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   - Echo (8/16) with EF normal range.  - Echo (2/22) with EF 25-30%, global hypokinesis, moderately decreased RV systolic function (no atrial arrhythmia).  - RHC/LHC (2/22): Mild nonobstructive CAD; mean RA 17, PA 70/27 mean 44, mean PCWP 13, CI 2.13, PVR 8.12.  - Cardiac MRI (3/22): EF 28%, diffuse hypokinesis, severe RV dilation with akinetic mid to apical  RV wall and RV EF 30%, D-shaped septum, nonspecific RV insertion site LGE. 10. Pulmonary hypertension: RHC (2/22) with mean RA 17, PA 70/27 mean 44, mean PCWP 13, CI 2.13, PVR 8.12. - V/Q scan: No evidence for chronic PE.  11. OSA  SH: Retired Civil engineer, contracting, married, lives in [place].  Enjoys fishing.  Remote smoker, no ETOH.   Family History  Problem Relation Age of Onset   Coronary artery disease Sister    Heart failure Mother    ROS: All systems reviewed and negative except as per HPI.   Current Outpatient Medications  Medication Sig Dispense Refill   albuterol (VENTOLIN HFA) 108 (90 Base) MCG/ACT inhaler Inhale 2 puffs into the lungs every 4 (four) hours as needed for shortness of breath or wheezing. 18 g 0   allopurinol (ZYLOPRIM) 100 MG tablet TAKE 1 TABLET(100 MG) BY MOUTH DAILY 90 tablet 3   aspirin 81 MG chewable tablet Chew 81 mg by mouth daily.     atorvastatin (LIPITOR) 20 MG tablet TAKE 1 TABLET BY MOUTH DAILY 90 tablet 3   Budeson-Glycopyrrol-Formoterol (BREZTRI AEROSPHERE) 160-9-4.8 MCG/ACT AERO Inhale 2 puffs into the lungs in the morning and at bedtime. 5.9 g 0   Cyanocobalamin (B-12 PO) Take 1,000 mcg by mouth daily.      empagliflozin ([name]) 10 MG TABS tablet TAKE 1 TABLET(10 MG) BY MOUTH DAILY BEFORE BREAKFAST 90 tablet 3   furosemide (LASIX) 40 MG tablet Take 1.5 tablets (60 mg total) by mouth in the morning AND 1 tablet (40 mg total) every evening. 90 tablet 11   metoprolol succinate (TOPROL-XL) 25 MG 24 hr tablet TAKE 1 TABLET(25 MG) BY MOUTH DAILY 30 tablet 0   N-ACETYL CYSTEINE PO Take by mouth.     potassium chloride SA (KLOR-CON) 20 MEQ tablet Take 1 tablet (20 mEq total) by mouth daily. 90 tablet 3   predniSONE (DELTASONE) 10 MG tablet TAKE 1 TABLET(10 MG) BY MOUTH DAILY WITH BREAKFAST 30 tablet 2   pregabalin (LYRICA) 50 MG capsule TAKE 1 CAPSULE BY MOUTH IN THE MORNING AND AT BEDTIME 180 capsule 0   spironolactone (ALDACTONE) 25 MG  tablet Take 1 tablet (25 mg total) by mouth at bedtime. 30 tablet 11   TYVASO REFILL 0.6 MG/ML SOLN USE UP TO 9-12 BREATHS 4 TIMES DAILY WITH TYVASO UNIT. LOAD DRUG INTO NEBULIZER BEFORE 1ST USE OF DAY. DISCARD REMAINING DRUG AND CLEAN NEBULIZER AFTER LAST DOSE OF DAY. 1 mL 10   No current facility-administered medications for this encounter.   Wt Readings from Last 3 Encounters:  01/06/22 80.6 kg (177 lb 12.8 oz)  12/20/21 76.7 kg (169 lb)  12/09/21 80.2 kg (176 lb 12.8 oz)   BP 94/78    Pulse 85    Wt 80.6 kg (177 lb 12.8 oz)    SpO2 96%    BMI 30.52 kg/m   General:  NAD. No resp difficulty, arrived in [facility] on oxygen HEENT: Normal Neck: Supple. JVP to ear. Carotids 2+ bilat;  no bruits. No lymphadenopathy or thryomegaly appreciated. Cor: PMI nondisplaced. Regular rate & rhythm. No rubs, gallops or murmurs. Lungs: Clear, faint crackles RLL Abdomen: Soft, nontender, +distended. No hepatosplenomegaly. No bruits or masses. Good bowel sounds. Extremities: No cyanosis, clubbing, rash, edema Neuro: Alert & oriented x 3, cranial nerves grossly intact. Moves all 4 extremities w/o difficulty. Affect pleasant.  Assessment/Plan: 1. Chronic systolic CHF: Nonischemic cardiomyopathy, cath in 2/22 with nonobstructive CAD.  In past, he was thought to have tachy-mediated CMP.  However, he has not had any recent prolonged fibrillation or flutter, so this does not seem to be the culprit for the recurrent drop in EF. Echo in 2/22 showed biventricular failure with LV EF 25-30% and moderate RV dysfunction (RVH on echo).  RHC in 2/22 showed elevated right-sided filling pressure and pulmonary arterial hypertension.  Cardiac MRI in 3/22 showed EF 28%, diffuse hypokinesis, severe RV dilation with akinetic mid to apical RV wall and RV EF 30%, D-shaped septum, nonspecific RV insertion site LGE.  RV failure most likely is cor pulmonale with pulmonary fibrosis and pulmonary hypertension, but regional wall motion  abnormalities (apex and mid-wall RV akinesis) brings up concern for ARVC (though no evidence for fibrofatty RV infiltration on MRI).  Today, NYHA class IIIb symptoms, he is at least mildly volume overloaded on exam, ReDs 41%.  I suspect that the main issue here causing dyspnea is pulmonary fibrosis and emphysema.   - No BP room to start valsartan today (BP did not tolerate Entresto). - Increase Lasix to 80 mg bid + extra 20 KCL daily. BMET/BNP today, repeat BMET in 10-14 days. - Continue Toprol XL 25 mg qhs. - Continue [name] 10 mg daily.  - Continue spironolactone 25 mg q hs. - Given age, would not pursue ICD.  Narrow QRS so not CRT candidate.  2. 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 PVR 8.1 on 2/22 cath. V/Q scan not suggestive of chronic PE. Possible group 1 PH component.  PH is contributing to RV systolic dysfunction.  He feels like Tyvaso has helped his breathing, but still very symptomatic.  - Continue Tyvaso, he does not want to switch to DPI.     - Continue CPAP.  3. Pulmonary fibrosis: Restrictive PFTs in 2/22.  CT chest in 12/21 with progressive pulmonary fibrosis and honeycombing.  COVID-19 lung involvement could have worsened pulmonary parenchymal disease.  High resolution CT chest in 9/22 showed interstitial lung disease consistent with UIP as well as moderate emphysema.   - He follows with pulmonary, would he be a candidate for antifibrotic therapy?  - Continue home oxygen.  4. Atrial flutter: Noted in 1/15, had ablation in 2/15, no atrial fibrillation documented since that time.  He is regular on exam today.  He is not anticoagulated.  5. COPD: Emphysema on chest CT. Breathing considerably better with prednisone. He wants to continue long-term.  He needs to followup with pulmonary.   Followup in 2 months with Dr. Aundra Dubin as scheduled.  [place] FNP 01/06/2022

## 2022-01-05 ENCOUNTER — Other Ambulatory Visit (HOSPITAL_COMMUNITY): Payer: Self-pay | Admitting: Cardiology

## 2022-01-06 ENCOUNTER — Other Ambulatory Visit: Payer: Self-pay

## 2022-01-06 ENCOUNTER — Encounter (HOSPITAL_COMMUNITY): Payer: Self-pay

## 2022-01-06 ENCOUNTER — Ambulatory Visit (HOSPITAL_COMMUNITY)
Admission: RE | Admit: 2022-01-06 | Discharge: 2022-01-06 | Disposition: A | Discharge status: Home / Self Care | Payer: PPO | Source: Ambulatory Visit | Attending: Family Medicine | Admitting: Family Medicine

## 2022-01-06 VITALS — BP 94/78 | HR 85 | Wt 177.8 lb

## 2022-01-06 DIAGNOSIS — G4733 Obstructive sleep apnea (adult) (pediatric): Secondary | ICD-10-CM

## 2022-01-06 DIAGNOSIS — I2721 Secondary pulmonary arterial hypertension: Secondary | ICD-10-CM | POA: Insufficient documentation

## 2022-01-06 DIAGNOSIS — Z7952 Long term (current) use of systemic steroids: Secondary | ICD-10-CM | POA: Insufficient documentation

## 2022-01-06 DIAGNOSIS — I428 Other cardiomyopathies: Secondary | ICD-10-CM | POA: Insufficient documentation

## 2022-01-06 DIAGNOSIS — Z7984 Long term (current) use of oral hypoglycemic drugs: Secondary | ICD-10-CM | POA: Diagnosis not present

## 2022-01-06 DIAGNOSIS — Z7901 Long term (current) use of anticoagulants: Secondary | ICD-10-CM | POA: Insufficient documentation

## 2022-01-06 DIAGNOSIS — J8410 Pulmonary fibrosis, unspecified: Secondary | ICD-10-CM | POA: Insufficient documentation

## 2022-01-06 DIAGNOSIS — J439 Emphysema, unspecified: Secondary | ICD-10-CM | POA: Diagnosis not present

## 2022-01-06 DIAGNOSIS — I5022 Chronic systolic (congestive) heart failure: Secondary | ICD-10-CM | POA: Insufficient documentation

## 2022-01-06 DIAGNOSIS — J449 Chronic obstructive pulmonary disease, unspecified: Secondary | ICD-10-CM | POA: Diagnosis not present

## 2022-01-06 DIAGNOSIS — I272 Pulmonary hypertension, unspecified: Secondary | ICD-10-CM | POA: Diagnosis not present

## 2022-01-06 DIAGNOSIS — I4892 Unspecified atrial flutter: Secondary | ICD-10-CM | POA: Diagnosis not present

## 2022-01-06 DIAGNOSIS — Z79899 Other long term (current) drug therapy: Secondary | ICD-10-CM | POA: Diagnosis not present

## 2022-01-06 DIAGNOSIS — I2510 Atherosclerotic heart disease of native coronary artery without angina pectoris: Secondary | ICD-10-CM | POA: Insufficient documentation

## 2022-01-06 DIAGNOSIS — Z8616 Personal history of COVID-19: Secondary | ICD-10-CM | POA: Diagnosis not present

## 2022-01-06 DIAGNOSIS — Z8679 Personal history of other diseases of the circulatory system: Secondary | ICD-10-CM

## 2022-01-06 DIAGNOSIS — I110 Hypertensive heart disease with heart failure: Secondary | ICD-10-CM | POA: Diagnosis not present

## 2022-01-06 LAB — BASIC METABOLIC PANEL
Anion gap: 13 (ref 5–15)
BUN: 31 mg/dL — ABNORMAL HIGH (ref 8–23)
CO2: 26 mmol/L (ref 22–32)
Calcium: 8.9 mg/dL (ref 8.9–10.3)
Chloride: 97 mmol/L — ABNORMAL LOW (ref 98–111)
Creatinine, Ser: 1.73 mg/dL — ABNORMAL HIGH (ref 0.61–1.24)
GFR, Estimated: 38 mL/min — ABNORMAL LOW (ref >60)
Glucose, Bld: 176 mg/dL — ABNORMAL HIGH (ref 70–99)
Potassium: 3.9 mmol/L (ref 3.5–5.1)
Sodium: 136 mmol/L (ref 135–145)

## 2022-01-06 LAB — BRAIN NATRIURETIC PEPTIDE: B Natriuretic Peptide: 209.9 pg/mL — ABNORMAL HIGH (ref 0.0–100.0)

## 2022-01-06 MED ORDER — POTASSIUM CHLORIDE CRYS ER 20 MEQ PO TBCR: 40.0000 meq | EXTENDED_RELEASE_TABLET | Freq: Every day | ORAL | 3 refills | Status: AC

## 2022-01-06 MED ORDER — FUROSEMIDE 40 MG PO TABS: 80.0000 mg | ORAL_TABLET | Freq: Two times a day (BID) | ORAL | 11 refills | Status: AC

## 2022-01-06 NOTE — Progress Notes (Signed)
ReDS Vest / Clip - 01/06/22 1500   ? ?  ? ReDS Vest / Clip  ? Station Marker A   ? Ruler Value 34   ? ReDS Value Range High volume overload   ? ReDS Actual Value 41   ? ?  ?  ? ?  ? ? ?

## 2022-01-06 NOTE — Patient Instructions (Addendum)
Medication Changes: ? ?Increase Furosemide to 80 mg Twice daily  ? ?Increase Potassium to 40 meq (2 tabs) Daily ? ?Lab Work: ? ?Done today, we will call you for abnormal results ? ?Your physician recommends that you return for lab work in: 1-2 weeks, this can be done at [facility] ? ?Testing/Procedures: ? ?None ? ?Referrals: ? ?None ? ?Special Instructions // Education: ? ?Do the following things EVERYDAY: ?Weigh yourself in the morning before breakfast. Write it down and keep it in a log. ?Take your medicines as prescribed ?Eat low salt foods--Limit salt (sodium) to 2000 mg per day.  ?Stay as active as you can everyday ?Limit all fluids for the day to less than 2 liters ? ? ?Follow-Up in: AS SCHEDULED on 03/10/22 at 10 am ? ?At the [facility], you and your health needs are our priority. We have a designated team specialized in the treatment of Heart Failure. This Care Team includes your primary Heart Failure Specialized Cardiologist (physician), Advanced Practice Providers (APPs- Physician Assistants and Nurse Practitioners), and Pharmacist who all work together to provide you with the care you need, when you need it.  ? ?You may see any of the following providers on your designated Care Team at your next follow up: ? ?Dr Glori Bickers ?Dr Loralie Champagne ?Darrick Grinder, NP ?Lyda Jester, PA ?Jessica Milford,NP ?Marlyce Huge, PA ?Audry Riles, PharmD ? ? ?Please be sure to bring in all your medications bottles to every appointment.  ? ?Need to Contact us: ? ?If you have any questions or concerns before your next appointment please send [place] a message through [place] or call our office at [phone].   ? ?TO LEAVE A MESSAGE FOR THE NURSE SELECT OPTION 2, PLEASE LEAVE A MESSAGE INCLUDING: ?YOUR NAME ?DATE OF BIRTH ?CALL BACK NUMBER ?REASON FOR CALL**this is important as we prioritize the call backs ? ?YOU WILL RECEIVE A CALL BACK THE SAME DAY AS LONG AS YOU CALL BEFORE 4:00 PM ? ? ?

## 2022-01-08 ENCOUNTER — Other Ambulatory Visit: Payer: Self-pay | Admitting: Pulmonary Disease

## 2022-01-08 ENCOUNTER — Other Ambulatory Visit: Payer: Self-pay | Admitting: Cardiovascular Disease

## 2022-01-09 DIAGNOSIS — R251 Tremor, unspecified: Secondary | ICD-10-CM | POA: Insufficient documentation

## 2022-01-09 NOTE — Assessment & Plan Note (Signed)
Stable, chronic.  Continue current medication. ? ? ?Toprol XL 25 mg daily ? spironolactone 25 mg daily ?Lasix 60 mg in AM and 40 mg in PM ?

## 2022-01-09 NOTE — Assessment & Plan Note (Signed)
Stable, chronic.  Continue current medication. ? ? ? jardiance 10 mg daily ?

## 2022-01-09 NOTE — Assessment & Plan Note (Signed)
Closer to euvolemic in office today. ?

## 2022-01-09 NOTE — Assessment & Plan Note (Signed)
Stable, chronic.  Continue current medication. ? ? ?Atorvastatin 20 mg daily ?

## 2022-01-09 NOTE — Assessment & Plan Note (Signed)
Acute, worsened ? ?Can try a trial off of Lyrica.Marland Kitchen to see if causing contributing to the tremor and if true benefit with foot pain. ?

## 2022-01-09 NOTE — Assessment & Plan Note (Signed)
Acute on chronic ? ? Can try Pepcid AC for upper abdominal pain/bloating after meals.Marland Kitchen avoid acidic foods. ?

## 2022-01-09 NOTE — Assessment & Plan Note (Signed)
On continuous oxygen 

## 2022-01-10 DIAGNOSIS — G4730 Sleep apnea, unspecified: Secondary | ICD-10-CM | POA: Diagnosis not present

## 2022-01-10 DIAGNOSIS — R0683 Snoring: Secondary | ICD-10-CM | POA: Diagnosis not present

## 2022-01-18 ENCOUNTER — Telehealth: Payer: Self-pay

## 2022-01-18 DIAGNOSIS — J1282 Pneumonia due to coronavirus disease 2019: Secondary | ICD-10-CM | POA: Diagnosis not present

## 2022-01-18 DIAGNOSIS — R0902 Hypoxemia: Secondary | ICD-10-CM | POA: Diagnosis not present

## 2022-01-18 NOTE — Telephone Encounter (Signed)
ONO results placed into [name], folder for review. Nothing further needed.  ?

## 2022-01-20 ENCOUNTER — Encounter: Payer: Self-pay | Admitting: Pulmonary Disease

## 2022-01-20 ENCOUNTER — Other Ambulatory Visit: Payer: Self-pay

## 2022-01-20 ENCOUNTER — Other Ambulatory Visit
Admission: RE | Admit: 2022-01-20 | Discharge: 2022-01-20 | Disposition: A | Discharge status: Home / Self Care | Payer: PPO | Source: Ambulatory Visit | Attending: Family Medicine | Admitting: Family Medicine

## 2022-01-20 ENCOUNTER — Ambulatory Visit: Payer: PPO | Admitting: Pulmonary Disease

## 2022-01-20 VITALS — BP 128/60 | HR 94 | Temp 97.8°F | Ht 63.5 in | Wt 175.4 lb

## 2022-01-20 DIAGNOSIS — I739 Peripheral vascular disease, unspecified: Secondary | ICD-10-CM | POA: Diagnosis not present

## 2022-01-20 DIAGNOSIS — Z7951 Long term (current) use of inhaled steroids: Secondary | ICD-10-CM | POA: Diagnosis not present

## 2022-01-20 DIAGNOSIS — J84112 Idiopathic pulmonary fibrosis: Secondary | ICD-10-CM | POA: Diagnosis not present

## 2022-01-20 DIAGNOSIS — E538 Deficiency of other specified B group vitamins: Secondary | ICD-10-CM | POA: Insufficient documentation

## 2022-01-20 DIAGNOSIS — J9611 Chronic respiratory failure with hypoxia: Secondary | ICD-10-CM

## 2022-01-20 DIAGNOSIS — Z7952 Long term (current) use of systemic steroids: Secondary | ICD-10-CM | POA: Diagnosis not present

## 2022-01-20 DIAGNOSIS — Z9981 Dependence on supplemental oxygen: Secondary | ICD-10-CM | POA: Diagnosis not present

## 2022-01-20 DIAGNOSIS — E1151 Type 2 diabetes mellitus with diabetic peripheral angiopathy without gangrene: Secondary | ICD-10-CM | POA: Insufficient documentation

## 2022-01-20 DIAGNOSIS — Z6827 Body mass index (BMI) 27.0-27.9, adult: Secondary | ICD-10-CM | POA: Diagnosis not present

## 2022-01-20 DIAGNOSIS — J449 Chronic obstructive pulmonary disease, unspecified: Secondary | ICD-10-CM

## 2022-01-20 DIAGNOSIS — I272 Pulmonary hypertension, unspecified: Secondary | ICD-10-CM | POA: Diagnosis not present

## 2022-01-20 DIAGNOSIS — J8410 Pulmonary fibrosis, unspecified: Secondary | ICD-10-CM | POA: Diagnosis not present

## 2022-01-20 DIAGNOSIS — M1A00X1 Idiopathic chronic gout, unspecified site, with tophus (tophi): Secondary | ICD-10-CM | POA: Insufficient documentation

## 2022-01-20 DIAGNOSIS — I5022 Chronic systolic (congestive) heart failure: Secondary | ICD-10-CM | POA: Diagnosis not present

## 2022-01-20 DIAGNOSIS — I110 Hypertensive heart disease with heart failure: Secondary | ICD-10-CM | POA: Diagnosis not present

## 2022-01-20 LAB — LIPID PANEL
Cholesterol: 150 mg/dL (ref 0–200)
HDL: 71 mg/dL (ref >40)
LDL Cholesterol: 61 mg/dL (ref 0–99)
Total CHOL/HDL Ratio: 2.1 RATIO
Triglycerides: 89 mg/dL (ref <150)
VLDL: 18 mg/dL (ref 0–40)

## 2022-01-20 LAB — COMPREHENSIVE METABOLIC PANEL
ALT: 22 U/L (ref 0–44)
AST: 29 U/L (ref 15–41)
Albumin: 3.9 g/dL (ref 3.5–5.0)
Alkaline Phosphatase: 61 U/L (ref 38–126)
Anion gap: 12 (ref 5–15)
BUN: 39 mg/dL — ABNORMAL HIGH (ref 8–23)
CO2: 30 mmol/L (ref 22–32)
Calcium: 9.4 mg/dL (ref 8.9–10.3)
Chloride: 96 mmol/L — ABNORMAL LOW (ref 98–111)
Creatinine, Ser: 1.58 mg/dL — ABNORMAL HIGH (ref 0.61–1.24)
GFR, Estimated: 42 mL/min — ABNORMAL LOW (ref >60)
Glucose, Bld: 149 mg/dL — ABNORMAL HIGH (ref 70–99)
Potassium: 4.1 mmol/L (ref 3.5–5.1)
Sodium: 138 mmol/L (ref 135–145)
Total Bilirubin: 1.1 mg/dL (ref 0.3–1.2)
Total Protein: 7.3 g/dL (ref 6.5–8.1)

## 2022-01-20 LAB — URIC ACID: Uric Acid, Serum: 6.2 mg/dL (ref 3.7–8.6)

## 2022-01-20 LAB — VITAMIN B12: Vitamin B-12: 1629 pg/mL — ABNORMAL HIGH (ref 180–914)

## 2022-01-20 LAB — HEMOGLOBIN A1C
Hgb A1c MFr Bld: 7.3 % — ABNORMAL HIGH (ref 4.8–5.6)
Mean Plasma Glucose: 162.81 mg/dL

## 2022-01-20 MED ORDER — BREZTRI AEROSPHERE 160-9-4.8 MCG/ACT IN AERO: 2.0000 | INHALATION_SPRAY | Freq: Two times a day (BID) | RESPIRATORY_TRACT | 0 refills | Status: DC

## 2022-01-20 NOTE — Progress Notes (Signed)
? ?Subjective:  ? ? Patient ID: Caleb Showers Sr., male    DOB: 1934-04-02, 88 y.o.   MRN: [id number] ?Patient Care Team: ?[name], MD as PCP - General ?Robert Bellow, MD (General Surgery) ?Venia Carbon, MD as Referring Physician (Internal Medicine) ?Agapito Games as Consulting Physician (Optometry) ?Debbora Dus, [facility] as Pharmacist (Pharmacist) ? ?Chief Complaint  ?Patient presents with  ? Follow-up  ? ?HPI ?Complex 88 year old former smoker (quit 1980, 26 PY) who presents for follow-up for the issue of shortness of breath and chronic respiratory failure with hypoxia in the setting of interstitial lung disease/pulmonary fibrosis and concomitant COPD.  He presents today doing a little bit better with regards to cough after his last visit of 3 February.  He has issues also with significant cardiomyopathy which add to his dyspnea sensation.  He has significant pulmonary hypertension and is on Tyvaso.  He has been tolerating the Tyvaso.  He just feels that he "gives out when he walks.  He uses 5 to 6 L of oxygen with ambulation 4 L at baseline.  He did perform nocturnal oximetry on 2 L and had to resume his 4 L during sleep.  During his last visit he was given a trial of Trelegy Ellipta however he prefers the [place].  Feels that this is controlling his cough better.  He is also on N-acetylcysteine 600 mg twice a day and prednisone 10 mg daily.  He has declined antifibrotic therapy.  He does not endorse any other new symptomatology today.  Again his main complaint is that he "gives out very easily when he is trying to perform activities of daily living. ? ?With regards to advanced directives patient is DNR/DNI.  He has made this clear. ? ?DATA ?01/25/2016 spirometry: FEV1 1.7 L or 62% predicted, FVC 2.4 L or 64% predicted.  FEV1/FVC 73%.  FEF 25-75% 1.4 L or 56% predicted.  Suspect combined restrictive/obstructive physiology. ?01/28/2016 CT chest: Nonspecific interstitial lung disease  present with peripheral distribution. ?10/13/2020 CT chest: Marked interval progression of underlying interstitial lung disease demonstrating peripheral and basilar predominant architectural distortion, honeycombing and subpleural reticulation.  Basilar groundglass pulmonary infiltrates, bullous emphysema, findings consistent with UIP plus emphysema ?11/16/2020 CTD work-up: RF 14.7,  ANA negative, RP 0.6, ESR 3 ?11/30/2020 2D echo: LVEF 25 to 30%, global hypokinesis of the LV, DD grade I, RV systolic function moderately reduced, right ventricular size moderately enlarged, dilated pulmonary artery ?12/03/2020 PFTs: FEV1 1.62 L or 77% predicted, FVC 2.02 L or 66% predicted, FEV1/FVC 80%, no bronchodilator response, lung volumes moderately to severely reduced, FEF 25-75 % 64%.  Diffusion capacity moderately to severely reduced at 55% ?12/17/2020 right and left heart cath: ost RCA to proximal axillary lesion is 20% stenosed, proximal RCA lesion is 30% stenosis, mid RCA lesion 20% stenosed. ?RA: 15/22 with a mean of 17 mmHg ?RV: 70 over 9 mmHg ?PCW: 13 mmHg ?PA: 70/27 with a mean of 44 mmHg ?LVEDP: 13 mmHg. ?PA sat was 65.7% and aortic sat was 96.5%. ?Cardiac output was 3.82 with a cardiac index of 2.13. ?Pulmonary vascular resistance was 8.12 Woods units ?12/21/2020 BNP: 1,273 ?01/18/2021 cardiac MRI: EF of 28% diffuse hypokinesis, severe RV dilatation with a connective mid to apical RV wall RV EF of 30% D-shaped septum suggestive of RV pressure/volume overload with a EF of 20%.  No RV thrombus.  Consistent with biventricular failure though RV looks worse ?02/02/2021 pulmonary perfusion: No acute or chronic blood clot ?07/07/2021 CT chest  high-resolution: No progression of fibrosis, spectrum of findings consistent with UIP.  Moderate centrilobular and paraseptal emphysema, cardiomegaly ?07/11/2021 CT angio chest: Negative for PE.  Unchanged moderate pulmonary fibrosis, emphysema, CAD ? ?Review of Systems ?A 10 point  review of systems was performed and it is as noted above otherwise negative. ? ?Patient Active Problem List  ? Diagnosis Date Noted  ? Tremor 01/09/2022  ? Pulmonary HTN ([place]) 06/15/2021  ? Acute systolic heart failure ([place])   ? Hypoxia 08/20/2020  ? CKD (chronic kidney disease), stage II   ? Acute respiratory failure with hypoxia ([place])   ? Stage 3a chronic kidney disease ([place])   ? Hypokalemia   ? Type 2 diabetes mellitus with hyperlipidemia ([place])   ? GERD (gastroesophageal reflux disease) 10/17/2016  ? CKD stage 3 due to type 2 diabetes mellitus ([place]) 10/04/2016  ? Idiopathic pulmonary fibrosis ([place]) 02/25/2016  ? Thoracic aortic aneurysm without rupture ([place]) 02/01/2016  ? Chronic cough 01/25/2016  ? Chronic obstructive pulmonary disease ([place]) 01/25/2016  ? Midline thoracic back pain 10/19/2015  ? Advanced directives, counseling/discussion 10/14/2015  ? History of atrial flutter 12/21/2014  ? Ventral hernia without obstruction or gangrene 10/02/2014  ? Chronic systolic CHF (congestive heart failure) ([place]) 12/23/2013  ? Intermediate coronary syndrome ([place]) 11/04/2013  ? 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 DDD (degenerative disc disease), cervical 03/31/2013  ? Fuchs' corneal dystrophy 02/19/2013  ? Neuropathy due to secondary diabetes mellitus ([place]) 12/31/2012  ? 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 DM (diabetes mellitus) with peripheral vascular complication ([place]) 58/85/0277  ? Shoulder pain, bilateral 11/06/2011  ? Vitamin B12 deficiency 03/11/2009  ? INTERMITTENT VERTIGO 03/11/2009  ? Mild dementia ([place]) 09/01/2008  ? Hyperlipidemia associated with type 2 diabetes mellitus ([place]) 11/29/2007  ? ALLERGIC RHINITIS 11/29/2007  ? Hypertension associated with diabetes ([place]) 11/14/2007  ? OSTEOARTHRITIS 06/13/2007  ? Peripheral vascular disease due to secondary diabetes mellitus ([place]) 06/12/2007  ? DIVERTICULOSIS, COLON 06/12/2007  ? BENIGN PROSTATIC HYPERTROPHY 06/12/2007  ? ?Social History  ? ?Tobacco Use  ?  Smoking status: Former  ?  Packs/day: 3.00  ?  Years: 30.00  ?  Pack years: 90.00  ?  Types: Cigarettes  ?  Quit date: 40  ?  Years since quitting: 43.3  ? Smokeless tobacco: Former  ?  Types: Chew  ? Tobacco comments:  ?  11/04/2013 "quit smoking in the 1980's or so; quit chewing before I quit smoking"  ?Substance Use Topics  ? Alcohol use: No  ?  Comment: 11/04/2013 "quit driking in 1980's; was called a weekend alcoholic"  ? ?Allergies  ?Allergen Reactions  ? Codeine Nausea And Vomiting  ? ?Current Meds  ?Medication Sig  ? albuterol (VENTOLIN HFA) 108 (90 Base) MCG/ACT inhaler Inhale 2 puffs into the lungs every 4 (four) hours as needed for shortness of breath or wheezing.  ? allopurinol (ZYLOPRIM) 100 MG tablet TAKE 1 TABLET(100 MG) BY MOUTH DAILY  ? aspirin 81 MG chewable tablet Chew 81 mg by mouth daily.  ? atorvastatin (LIPITOR) 20 MG tablet TAKE 1 TABLET BY MOUTH DAILY  ? Budeson-Glycopyrrol-Formoterol (BREZTRI AEROSPHERE) 160-9-4.8 MCG/ACT AERO Inhale 2 puffs into the lungs in the morning and at bedtime.  ? Budeson-Glycopyrrol-Formoterol (BREZTRI AEROSPHERE) 160-9-4.8 MCG/ACT AERO Inhale 2 puffs into the lungs in the morning and at bedtime.  ? Cyanocobalamin (B-12 PO) Take 1,000 mcg by mouth daily.   ? empagliflozin ([name]) 10 MG TABS tablet TAKE 1 TABLET(10 MG) BY MOUTH DAILY BEFORE  BREAKFAST  ? furosemide (LASIX) 40 MG tablet Take 2 tablets (80 mg total) by mouth 2 (two) times daily.  ? metoprolol succinate (TOPROL-XL) 25 MG 24 hr tablet TAKE 1 TABLET(25 MG) BY MOUTH DAILY  ? N-ACETYL CYSTEINE PO Take by mouth.  ? potassium chloride SA (KLOR-CON M) 20 MEQ tablet Take 2 tablets (40 mEq total) by mouth daily.  ? predniSONE (DELTASONE) 10 MG tablet TAKE 1 TABLET(10 MG) BY MOUTH DAILY WITH BREAKFAST  ? pregabalin (LYRICA) 50 MG capsule TAKE 1 CAPSULE BY MOUTH IN THE MORNING AND AT BEDTIME  ? spironolactone (ALDACTONE) 25 MG tablet Take 1 tablet (25 mg total) by mouth at bedtime.  ? TYVASO REFILL 0.6 MG/ML  SOLN USE UP TO 9-12 BREATHS 4 TIMES DAILY WITH TYVASO UNIT. LOAD DRUG INTO NEBULIZER BEFORE 1ST USE OF DAY. DISCARD REMAINING DRUG AND CLEAN NEBULIZER AFTER LAST DOSE OF DAY.  ? ? ?   ?Objective:  ? Physical Exam

## 2022-01-20 NOTE — Patient Instructions (Addendum)
Continue using your oxygen at 4 L/min at nighttime. ? ?We will inquire about the life 2000 ventilation system. ? ?We have provided you with some Breztri samples. ? ?We will see you in follow-up in 3 months time call sooner should any new problems arise. ?

## 2022-02-06 ENCOUNTER — Encounter: Payer: Self-pay | Admitting: Pulmonary Disease

## 2022-02-11 ENCOUNTER — Other Ambulatory Visit: Payer: Self-pay | Admitting: Cardiovascular Disease

## 2022-02-13 NOTE — Telephone Encounter (Signed)
Please schedule F/U appt with Dr. Fletcher Anon for refills. Thank you! ?

## 2022-02-16 ENCOUNTER — Other Ambulatory Visit (HOSPITAL_COMMUNITY): Payer: Self-pay

## 2022-02-16 ENCOUNTER — Other Ambulatory Visit: Payer: Self-pay | Admitting: *Deleted

## 2022-02-16 MED ORDER — METOPROLOL SUCCINATE ER 25 MG PO TB24: 25.0000 mg | ORAL_TABLET | Freq: Every day | ORAL | 3 refills | Status: DC

## 2022-02-16 MED ORDER — METOPROLOL SUCCINATE ER 25 MG PO TB24: ORAL_TABLET | ORAL | 3 refills | Status: DC

## 2022-02-18 DIAGNOSIS — J1282 Pneumonia due to coronavirus disease 2019: Secondary | ICD-10-CM | POA: Diagnosis not present

## 2022-02-18 DIAGNOSIS — R0902 Hypoxemia: Secondary | ICD-10-CM | POA: Diagnosis not present

## 2022-02-23 ENCOUNTER — Telehealth: Payer: Self-pay | Admitting: Pulmonary Disease

## 2022-02-23 NOTE — Telephone Encounter (Signed)
Spoke to patient's spouse, [name]. ?She wanted to make [name] aware that patient is in the process of applying for patient assistance for life 2000. I have made [name] aware of this verbally. ?Nothing further needed.  ? ?

## 2022-03-09 ENCOUNTER — Telehealth: Payer: Self-pay | Admitting: Pulmonary Disease

## 2022-03-09 NOTE — Telephone Encounter (Signed)
scheduled

## 2022-03-09 NOTE — Telephone Encounter (Signed)
Spoke to [place] with [employer]. She is aware of below message and voiced her understanding. ?Nothing further needed.  ? ?

## 2022-03-09 NOTE — Telephone Encounter (Signed)
On his prior visit we had recommended Life 2000 portable ventilator for that reason.  [employer], the company that provides the ventilator is procuring device assistance for the patient.  Unfortunately, little else that can be added to his therapy.  He is very end-stage and may be approaching hospice. ?

## 2022-03-09 NOTE — Telephone Encounter (Signed)
Spoke with Loletha Grayer wit home health.  ?Loletha Grayer stated that she has an upcoming appt scheduled with patient. Case worker met with pt this week and patient mentioned increased SOB. Tyvaso is only lasting about 3hr.  ?No worsening cough or wheezing.  ?Pt wears 4L at night and 6L with exertion. ?Reece Levy to contact Dr. Aundra Dubin regarding Tonie Griffith.  ?She would like to know if [name] has additional recommendations.   ? ?[name], please advise. Thanks ? ?

## 2022-03-10 ENCOUNTER — Other Ambulatory Visit (HOSPITAL_COMMUNITY): Payer: Self-pay | Admitting: *Deleted

## 2022-03-10 ENCOUNTER — Ambulatory Visit (HOSPITAL_COMMUNITY)
Admission: RE | Admit: 2022-03-10 | Discharge: 2022-03-10 | Disposition: A | Discharge status: Home / Self Care | Payer: PPO | Source: Ambulatory Visit | Attending: Cardiology | Admitting: Cardiology

## 2022-03-10 VITALS — BP 108/66 | HR 55 | Ht 63.5 in | Wt 178.0 lb

## 2022-03-10 DIAGNOSIS — J439 Emphysema, unspecified: Secondary | ICD-10-CM | POA: Diagnosis not present

## 2022-03-10 DIAGNOSIS — Z7902 Long term (current) use of antithrombotics/antiplatelets: Secondary | ICD-10-CM | POA: Insufficient documentation

## 2022-03-10 DIAGNOSIS — I2510 Atherosclerotic heart disease of native coronary artery without angina pectoris: Secondary | ICD-10-CM | POA: Insufficient documentation

## 2022-03-10 DIAGNOSIS — Z7984 Long term (current) use of oral hypoglycemic drugs: Secondary | ICD-10-CM | POA: Insufficient documentation

## 2022-03-10 DIAGNOSIS — Z7951 Long term (current) use of inhaled steroids: Secondary | ICD-10-CM | POA: Insufficient documentation

## 2022-03-10 DIAGNOSIS — I110 Hypertensive heart disease with heart failure: Secondary | ICD-10-CM | POA: Diagnosis not present

## 2022-03-10 DIAGNOSIS — J8410 Pulmonary fibrosis, unspecified: Secondary | ICD-10-CM | POA: Insufficient documentation

## 2022-03-10 DIAGNOSIS — I4892 Unspecified atrial flutter: Secondary | ICD-10-CM | POA: Insufficient documentation

## 2022-03-10 DIAGNOSIS — Z79899 Other long term (current) drug therapy: Secondary | ICD-10-CM | POA: Insufficient documentation

## 2022-03-10 DIAGNOSIS — I5022 Chronic systolic (congestive) heart failure: Secondary | ICD-10-CM | POA: Diagnosis not present

## 2022-03-10 DIAGNOSIS — I272 Pulmonary hypertension, unspecified: Secondary | ICD-10-CM | POA: Diagnosis not present

## 2022-03-10 LAB — BASIC METABOLIC PANEL
Anion gap: 10 (ref 5–15)
BUN: 28 mg/dL — ABNORMAL HIGH (ref 8–23)
CO2: 34 mmol/L — ABNORMAL HIGH (ref 22–32)
Calcium: 9.3 mg/dL (ref 8.9–10.3)
Chloride: 96 mmol/L — ABNORMAL LOW (ref 98–111)
Creatinine, Ser: 1.61 mg/dL — ABNORMAL HIGH (ref 0.61–1.24)
GFR, Estimated: 41 mL/min — ABNORMAL LOW (ref >60)
Glucose, Bld: 134 mg/dL — ABNORMAL HIGH (ref 70–99)
Potassium: 3.9 mmol/L (ref 3.5–5.1)
Sodium: 140 mmol/L (ref 135–145)

## 2022-03-10 LAB — BRAIN NATRIURETIC PEPTIDE: B Natriuretic Peptide: 261.4 pg/mL — ABNORMAL HIGH (ref 0.0–100.0)

## 2022-03-10 MED ORDER — VALSARTAN 40 MG PO TABS: 20.0000 mg | ORAL_TABLET | Freq: Every day | ORAL | 11 refills | Status: DC

## 2022-03-10 MED ORDER — METOPROLOL SUCCINATE ER 25 MG PO TB24: 25.0000 mg | ORAL_TABLET | Freq: Every day | ORAL | 3 refills | Status: DC

## 2022-03-10 MED ORDER — TYVASO REFILL KIT 0.6 MG/ML IN SOLN: RESPIRATORY_TRACT | 10 refills | Status: AC

## 2022-03-10 NOTE — Patient Instructions (Addendum)
EKG done today. ? ?Labs done today. We will contact you only if your labs are abnormal. ? ?START Valsartan 28m (1/2 tablet) by mouth daily at bedtime. ? ?INCREASE Tyvaso to 12 breaths. Increase by 1 breath every week.  ? ?No other medication changes were made. Please continue all current medications as prescribed. ? ?Your physician recommends that you schedule a follow-up appointment soon for an echo, 10 days for a lab appointment only and in 2 months with our [facility] here in our office. ? ?Your physician has requested that you have an echocardiogram. Echocardiography is a painless test that uses sound waves to create images of your heart. It provides your doctor with information about the size and shape of your heart and how well your heart?s chambers and valves are working. This procedure takes approximately one hour. There are no restrictions for this procedure. ? ?If you have any questions or concerns before your next appointment please send [place] message through [place]or call our office at [phone]   ? ?TO LEAVE A MESSAGE FOR THE NURSE SELECT OPTION 2, PLEASE LEAVE A MESSAGE INCLUDING: ?YOUR NAME ?DATE OF BIRTH ?CALL BACK NUMBER ?REASON FOR CALL**this is important as we prioritize the call backs ? ?YOU WILL RECEIVE A CALL BACK THE SAME DAY AS LONG AS YOU CALL BEFORE 4:00 PM ? ? ?Do the following things EVERYDAY: ?Weigh yourself in the morning before breakfast. Write it down and keep it in a log. ?Take your medicines as prescribed ?Eat low salt foods--Limit salt (sodium) to 2000 mg per day.  ?Stay as active as you can everyday ?Limit all fluids for the day to less than 2 liters ? ? ?At the [facility] you and your health needs are our priority. As part of our continuing mission to provide you with exceptional heart care, we have created designated Provider Care Teams. These Care Teams include your primary Cardiologist (physician) and Advanced Practice Providers (APPs- Physician  Assistants and Nurse Practitioners) who all work together to provide you with the care you need, when you need it.  ? ?You may see any of the following providers on your designated Care Team at your next follow up: ?Dr DGlori Bickers?Dr DLoralie Champagne?ADarrick Grinder NP ?BLyda Jester PA ?LAudry Riles PharmD ? ? ?Please be sure to bring in all your medications bottles to every appointment.  ? ?

## 2022-03-12 NOTE — Progress Notes (Signed)
PCP: [name], MD ?Cardiology: Dr. Fletcher Anon ?HF Cardiology: Dr. Aundra Dubin ? ?88 y.o. with history of atrial flutter, pulmonary fibrosis, and chronic systolic CHF was referred by Dr. Fletcher Anon for evaluation of CHF and pulmonary hypertension.  Patient has had pulmonary fibrosis since 2013, thought to be related to working in a [employer], had exposure to asbestos.  Last CT chest in 12/21 showed progressive fibrosis and honeycombing.  In 1/15, patient was noted to be in atrial flutter.  Echo at the time showed EF 25-30% and cath showed minimal CAD.  He had atria fibrillation ablation in 2/15 and anticoagulation was later stopped.  Echo in 8/16 showed EF back to normal range.  In 10/21, he had COVID-19 PNA.  He feels like he has never recovered from this.  Prior to 10/21, he was short of breath after weed-eating for about 15 minutes.  After 10/21, progressive dyspnea and required home oxygen.   ? ?Echo was done in 2/22 due to worsening symptoms.  This showed EF 25-30% with moderate RV dysfunction.  RHC/LHC was then done in 2/22, showing mild luminal irregularities, elevated right heart filling pressure and severe pulmonary arterial hypertension with normal PCWP.  Cardiac MRI in 3/22 showed EF 28%, diffuse hypokinesis, severe RV dilation with akinetic mid to apical RV wall and RV EF 30%, D-shaped septum, nonspecific RV insertion site LGE.  V/Q scan in 4/22 showed no chronic PE.  High resolution CT chest in 9/22 showed interstitial lung disease consistent with UIP as well as moderate emphysema.  CTA chest in 9/22 showed no PE.  Zio monitor in 9/22 showed PACs, short SVT runs, no atrial fibrillation.  ? ?Patient returns for followup of CHF.  He is on chronic prednisone.  Weight is stable.  He is on 5-6 L home oxygen, not using CPAP.  BP did not tolerate Entresto.  He feels like Tyvaso helps but seems to "wear off" between doses.  He is up to 9 breaths.  He is short of breath walking around the house.  Some cough.  No chest  pain.  No orthopnea/PND.  No lightheadedness/syncope.      ? ?Labs (2/22): K 4, creatinine 1.23 => 1.18, hgb 14.2, BNP 1273 ?Labs (4/22): LDL 48, K 4.3, creatinine 1.48 ?Labs (9/22): K 3.7,creatinine 1.7 => 1.51, BNP 775 => 247 ?Labs (3/23): K 4.1, creatinine 1.58, LDL 48 ? ?ECG (personally reviewed): NSR, RBBB, RVH ? ?6 minute walk (12/22): 73 m (only walked 2.5 min) ? ?PMH: ?1. Atrial flutter: S/p ablation 2/15.  ?2. Type 2 diabetes ?3. HTN ?4. Hyperlipidemia ?5. Ascending aortic aneurysm: Small, 41 mm on 2/22 echo.  ?6. Pulmonary fibrosis: Exposure to environmental toxins including asbestos while working [employer].   Fibrosis was found in 2013.  ?- CT chest (12/21): Progressive ILD in UIP pattern with honeycombing.  ?- PFTs (2/22): FVC 66%, FEV1 77%, ratio 80%, TLC 47% => restrictive pattern.  ?- High resolution CT chest (9/22): interstitial lung disease consistent with UIP as well as moderate emphysema. ?7. COVID-19 infection 10/21.  ?8. COPD: Prior smoker.  ?- High resolution CT chest in 9/22 showed interstitial lung disease consistent with UIP as well as moderate emphysema.  ?9. 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   ?- Echo (8/16) with EF normal range.  ?- Echo (2/22) with EF 25-30%, global hypokinesis, moderately decreased RV systolic function (no atrial arrhythmia).  ?- RHC/LHC (2/22): Mild nonobstructive  CAD; mean RA 17, PA 70/27 mean 44, mean PCWP 13, CI 2.13, PVR 8.12.  ?- Cardiac MRI (3/22): EF 28%, diffuse hypokinesis, severe RV dilation with akinetic mid to apical RV wall and RV EF 30%, D-shaped septum, nonspecific RV insertion site LGE. ?10. Pulmonary hypertension: RHC (2/22) with mean RA 17, PA 70/27 mean 44, mean PCWP 13, CI 2.13, PVR 8.12. ?- V/Q scan: No evidence for chronic PE.  ?11. OSA ? ?SH: Retired Civil engineer, contracting, married, lives in [place].  Enjoys fishing.  Remote smoker, no ETOH.  ? ?Family History   ?Problem Relation Age of Onset  ? Coronary artery disease Sister   ? Heart failure Mother   ? ?ROS: All systems reviewed and negative except as per HPI.  ? ?Current Outpatient Medications  ?Medication Sig Dispense Refill  ? albuterol (VENTOLIN HFA) 108 (90 Base) MCG/ACT inhaler Inhale 2 puffs into the lungs every 4 (four) hours as needed for shortness of breath or wheezing. 18 g 0  ? allopurinol (ZYLOPRIM) 100 MG tablet TAKE 1 TABLET(100 MG) BY MOUTH DAILY 90 tablet 3  ? aspirin 81 MG chewable tablet Chew 81 mg by mouth daily.    ? atorvastatin (LIPITOR) 20 MG tablet TAKE 1 TABLET BY MOUTH DAILY 90 tablet 3  ? Budeson-Glycopyrrol-Formoterol (BREZTRI AEROSPHERE) 160-9-4.8 MCG/ACT AERO Inhale 2 puffs into the lungs in the morning and at bedtime. 5.9 g 0  ? Cyanocobalamin (B-12 PO) Take 1,000 mcg by mouth daily.     ? empagliflozin ([name]) 10 MG TABS tablet TAKE 1 TABLET(10 MG) BY MOUTH DAILY BEFORE BREAKFAST 90 tablet 3  ? furosemide (LASIX) 40 MG tablet Take 2 tablets (80 mg total) by mouth 2 (two) times daily. 120 tablet 11  ? N-ACETYL CYSTEINE PO Take by mouth.    ? potassium chloride SA (KLOR-CON M) 20 MEQ tablet Take 2 tablets (40 mEq total) by mouth daily. 180 tablet 3  ? predniSONE (DELTASONE) 10 MG tablet TAKE 1 TABLET(10 MG) BY MOUTH DAILY WITH BREAKFAST 30 tablet 2  ? pregabalin (LYRICA) 50 MG capsule TAKE 1 CAPSULE BY MOUTH IN THE MORNING AND AT BEDTIME 180 capsule 0  ? spironolactone (ALDACTONE) 25 MG tablet Take 1 tablet (25 mg total) by mouth at bedtime. 30 tablet 11  ? metoprolol succinate (TOPROL-XL) 25 MG 24 hr tablet Take 1 tablet (25 mg total) by mouth daily. 90 tablet 3  ? TYVASO REFILL 0.6 MG/ML SOLN Use 12 breaths 4 times daily with tyvaso unit. Increase by 1 breath every week. Load drug into nebulizer before 1st use of day. Discard remaining drug and clean nebulizer after last dose of day. 1 mL 10  ? valsartan (DIOVAN) 40 MG tablet Take 0.5 tablets (20 mg total) by mouth at bedtime. 15  tablet 11  ? ?No current facility-administered medications for this encounter.  ? ?BP 108/66   Pulse (!) 55   Ht 5' 3.5" (1.613 m)   Wt 80.7 kg (178 lb)   SpO2 96%   BMI 31.04 kg/m?  ?General: NAD ?Neck: No JVD, no thyromegaly or thyroid nodule.  ?Lungs: Coarse BS, dry crackles at bases.  ?CV: Nondisplaced PMI.  Heart regular S1/S2, no S3/S4, no murmur.  No peripheral edema.  No carotid bruit.  Normal pedal pulses.  ?Abdomen: Soft, nontender, no hepatosplenomegaly, no distention.  ?Skin: Intact without lesions or rashes.  ?Neurologic: Alert and oriented x 3.  ?Psych: Normal affect. ?Extremities: No clubbing or cyanosis.  ?HEENT: Normal.  ? ?  Assessment/Plan: ?1. Chronic systolic CHF: Nonischemic cardiomyopathy, cath in 2/22 with nonobstructive CAD.  In past, he was thought to have tachy-mediated CMP.  However, he has not had any recent prolonged fibrillation or flutter, so this does not seem to be the culprit for the recurrent drop in EF. Echo in 2/22 showed biventricular failure with LV EF 25-30% and moderate RV dysfunction (RVH on echo).  RHC in 2/22 showed elevated right-sided filling pressure and pulmonary arterial hypertension.  Cardiac MRI in 3/22 showed EF 28%, diffuse hypokinesis, severe RV dilation with akinetic mid to apical RV wall and RV EF 30%, D-shaped septum, nonspecific RV insertion site LGE.  RV failure most likely is cor pulmonale with pulmonary fibrosis and pulmonary hypertension, but regional wall motion abnormalities (apex and mid-wall RV akinesis) brings up concern for ARVC (though no evidence for fibrofatty RV infiltration on MRI).  Today, NYHA class IIIb symptoms, not volume overloaded on exam.  I suspect that the main issue here causing dyspnea is pulmonary fibrosis and emphysema.   ?- Continue Lasix 80 mg bid.  BMET/BNP today.  ?- Off Entresto due to low BP, add valsartan 20 mg qhs.  BMET 10 days.     ?- Continue Toprol XL 25 mg daily, take at night given soft BP.   ?- Continue  [name] 10 mg daily.  ?- Continue spironolactone 25 mg daily.   ?- Given age and comorbidities, would not pursue ICD.  Narrow QRS so not CRT candidate.  ?- Arrange for repeat echo.  ?2. Pulmonary hypertension: [place]

## 2022-03-17 ENCOUNTER — Other Ambulatory Visit
Admission: RE | Admit: 2022-03-17 | Discharge: 2022-03-17 | Disposition: A | Discharge status: Home / Self Care | Payer: PPO | Attending: Cardiology | Admitting: Cardiology

## 2022-03-17 ENCOUNTER — Other Ambulatory Visit: Payer: Self-pay | Admitting: Family Medicine

## 2022-03-17 DIAGNOSIS — F3340 Major depressive disorder, recurrent, in remission, unspecified: Secondary | ICD-10-CM | POA: Diagnosis not present

## 2022-03-17 DIAGNOSIS — I5022 Chronic systolic (congestive) heart failure: Secondary | ICD-10-CM | POA: Diagnosis not present

## 2022-03-17 DIAGNOSIS — D692 Other nonthrombocytopenic purpura: Secondary | ICD-10-CM | POA: Diagnosis not present

## 2022-03-17 DIAGNOSIS — I110 Hypertensive heart disease with heart failure: Secondary | ICD-10-CM | POA: Diagnosis not present

## 2022-03-17 DIAGNOSIS — I272 Pulmonary hypertension, unspecified: Secondary | ICD-10-CM | POA: Diagnosis not present

## 2022-03-17 DIAGNOSIS — Z515 Encounter for palliative care: Secondary | ICD-10-CM | POA: Diagnosis not present

## 2022-03-17 DIAGNOSIS — Z6828 Body mass index (BMI) 28.0-28.9, adult: Secondary | ICD-10-CM | POA: Diagnosis not present

## 2022-03-17 DIAGNOSIS — J449 Chronic obstructive pulmonary disease, unspecified: Secondary | ICD-10-CM | POA: Diagnosis not present

## 2022-03-17 DIAGNOSIS — Z7951 Long term (current) use of inhaled steroids: Secondary | ICD-10-CM | POA: Diagnosis not present

## 2022-03-17 DIAGNOSIS — J84112 Idiopathic pulmonary fibrosis: Secondary | ICD-10-CM | POA: Diagnosis not present

## 2022-03-17 DIAGNOSIS — Z66 Do not resuscitate: Secondary | ICD-10-CM | POA: Diagnosis not present

## 2022-03-17 DIAGNOSIS — J9610 Chronic respiratory failure, unspecified whether with hypoxia or hypercapnia: Secondary | ICD-10-CM | POA: Diagnosis not present

## 2022-03-17 LAB — BASIC METABOLIC PANEL
Anion gap: 11 (ref 5–15)
BUN: 30 mg/dL — ABNORMAL HIGH (ref 8–23)
CO2: 31 mmol/L (ref 22–32)
Calcium: 9.2 mg/dL (ref 8.9–10.3)
Chloride: 97 mmol/L — ABNORMAL LOW (ref 98–111)
Creatinine, Ser: 1.56 mg/dL — ABNORMAL HIGH (ref 0.61–1.24)
GFR, Estimated: 43 mL/min — ABNORMAL LOW (ref >60)
Glucose, Bld: 126 mg/dL — ABNORMAL HIGH (ref 70–99)
Potassium: 4.4 mmol/L (ref 3.5–5.1)
Sodium: 139 mmol/L (ref 135–145)

## 2022-03-17 NOTE — Telephone Encounter (Signed)
Last office visit 12/20/2021 for 6 month follow up.  Last refilled 12/15/2021 for #180 with no refills.  CPE scheduled for 06/23/2022.

## 2022-03-20 DIAGNOSIS — R0902 Hypoxemia: Secondary | ICD-10-CM | POA: Diagnosis not present

## 2022-03-20 DIAGNOSIS — J1282 Pneumonia due to coronavirus disease 2019: Secondary | ICD-10-CM | POA: Diagnosis not present

## 2022-03-24 ENCOUNTER — Telehealth (HOSPITAL_COMMUNITY): Payer: Self-pay | Admitting: *Deleted

## 2022-03-24 NOTE — Telephone Encounter (Signed)
Pts wife called to report pts weight was up 3lbs overnight she gave him an extra 91m of Lasix and it did not help his weight went up an additional 3lbs. Pts starting weight was 171lbs he is now 177lbs. She said patients abdomen is swollen and he is more short of breath.  Pts wife asked if we could adjust his medications.   Routed to DReesefor advice

## 2022-03-27 NOTE — Telephone Encounter (Signed)
He can take a dose of metolazone 2.5 x 1 + KCl 20 mEq with next dose of Lasix. Do this once.

## 2022-03-28 NOTE — Telephone Encounter (Signed)
Called to see if pts weight was still up and to advise. No answer/left vm for return call.

## 2022-04-05 ENCOUNTER — Other Ambulatory Visit: Payer: Self-pay | Admitting: Family Medicine

## 2022-04-07 ENCOUNTER — Telehealth (HOSPITAL_COMMUNITY): Payer: Self-pay | Admitting: *Deleted

## 2022-04-07 MED ORDER — METOPROLOL SUCCINATE ER 25 MG PO TB24: 12.5000 mg | ORAL_TABLET | Freq: Every day | ORAL | 3 refills | Status: AC

## 2022-04-07 NOTE — Telephone Encounter (Signed)
Amy home health RN called to report passed out in his bathroom earlier bp and O2 were low. Bp 70/50 and O2 in the 70's on 6L of O2. Pt did not have any injuries from fall. O2 increased to 7L and was in the 80's. Pt is on hospice and wants to stay home on comfort care. Per Dr.McLean stop valsartan stop spironolactone and decrease metoprolol to 12.23m daily. Amy aware.

## 2022-04-21 ENCOUNTER — Ambulatory Visit: Payer: PPO | Admitting: Physician Assistant

## 2022-05-12 ENCOUNTER — Other Ambulatory Visit (HOSPITAL_COMMUNITY): Payer: PPO

## 2022-05-12 ENCOUNTER — Ambulatory Visit: Payer: PPO | Admitting: Pulmonary Disease

## 2022-05-12 ENCOUNTER — Encounter (HOSPITAL_COMMUNITY): Payer: PPO

## 2022-05-25 ENCOUNTER — Telehealth: Payer: Self-pay | Admitting: Family Medicine

## 2022-05-25 NOTE — Telephone Encounter (Signed)
Appointments have been cancelled.  FYI to Dr. Diona Browner.

## 2022-05-25 NOTE — Telephone Encounter (Signed)
Patients wife called to cancel appointments for patient and said that he passed away yesterday May 25, 2022

## 2022-05-26 NOTE — Telephone Encounter (Signed)
Spoke with patient's wife. He was a wonderful individual and will be missed by both myself and my staff. She appreciated the call.

## 2022-05-30 DEATH — deceased

## 2022-06-05 ENCOUNTER — Ambulatory Visit: Payer: PPO | Admitting: Pulmonary Disease

## 2022-06-07 ENCOUNTER — Telehealth: Payer: PPO

## 2022-06-19 ENCOUNTER — Ambulatory Visit: Payer: PPO

## 2022-06-23 ENCOUNTER — Encounter: Payer: PPO | Admitting: Family Medicine
